# Patient Record
Sex: Male | Born: 1960 | Race: Black or African American | Hispanic: No | Marital: Married | Smoking: Former smoker
Health system: Southern US, Community
[De-identification: ages and names within clinical notes are randomized; demographics above are authoritative.]

## PROBLEM LIST (undated history)

## (undated) ENCOUNTER — Emergency Department (HOSPITAL_COMMUNITY): Payer: Medicare HMO

## (undated) DIAGNOSIS — M1990 Unspecified osteoarthritis, unspecified site: Secondary | ICD-10-CM

## (undated) DIAGNOSIS — I10 Essential (primary) hypertension: Secondary | ICD-10-CM

## (undated) DIAGNOSIS — F419 Anxiety disorder, unspecified: Secondary | ICD-10-CM

## (undated) DIAGNOSIS — F329 Major depressive disorder, single episode, unspecified: Secondary | ICD-10-CM

## (undated) DIAGNOSIS — F1910 Other psychoactive substance abuse, uncomplicated: Secondary | ICD-10-CM

## (undated) DIAGNOSIS — F32A Depression, unspecified: Secondary | ICD-10-CM

## (undated) DIAGNOSIS — M109 Gout, unspecified: Secondary | ICD-10-CM

## (undated) DIAGNOSIS — E785 Hyperlipidemia, unspecified: Secondary | ICD-10-CM

## (undated) DIAGNOSIS — D571 Sickle-cell disease without crisis: Secondary | ICD-10-CM

## (undated) DIAGNOSIS — I00 Rheumatic fever without heart involvement: Secondary | ICD-10-CM

## (undated) DIAGNOSIS — I639 Cerebral infarction, unspecified: Secondary | ICD-10-CM

## (undated) DIAGNOSIS — E039 Hypothyroidism, unspecified: Secondary | ICD-10-CM

## (undated) HISTORY — PX: TONSILLECTOMY: SUR1361

## (undated) HISTORY — DX: Rheumatic fever without heart involvement: I00

## (undated) HISTORY — DX: Other psychoactive substance abuse, uncomplicated: F19.10

## (undated) HISTORY — DX: Unspecified osteoarthritis, unspecified site: M19.90

## (undated) HISTORY — PX: COLONOSCOPY: SHX174

## (undated) HISTORY — DX: Sickle-cell disease without crisis: D57.1

## (undated) HISTORY — DX: Hyperlipidemia, unspecified: E78.5

## (undated) HISTORY — PX: OTHER SURGICAL HISTORY: SHX169

---

## 2004-10-20 ENCOUNTER — Emergency Department (HOSPITAL_COMMUNITY): Admission: EM | Admit: 2004-10-20 | Discharge: 2004-10-21 | Payer: Self-pay | Admitting: Emergency Medicine

## 2007-07-31 ENCOUNTER — Emergency Department (HOSPITAL_COMMUNITY): Admission: EM | Admit: 2007-07-31 | Discharge: 2007-07-31 | Payer: Self-pay | Admitting: Emergency Medicine

## 2008-09-28 ENCOUNTER — Emergency Department (HOSPITAL_COMMUNITY): Admission: EM | Admit: 2008-09-28 | Discharge: 2008-09-28 | Payer: Self-pay | Admitting: Emergency Medicine

## 2009-04-16 ENCOUNTER — Encounter (INDEPENDENT_AMBULATORY_CARE_PROVIDER_SITE_OTHER): Payer: Self-pay | Admitting: Emergency Medicine

## 2009-04-16 ENCOUNTER — Ambulatory Visit: Payer: Self-pay | Admitting: Vascular Surgery

## 2009-04-16 ENCOUNTER — Ambulatory Visit (HOSPITAL_COMMUNITY): Admission: RE | Admit: 2009-04-16 | Discharge: 2009-04-16 | Payer: Self-pay | Admitting: Emergency Medicine

## 2009-04-16 ENCOUNTER — Emergency Department (HOSPITAL_COMMUNITY): Admission: EM | Admit: 2009-04-16 | Discharge: 2009-04-16 | Payer: Self-pay | Admitting: Emergency Medicine

## 2011-03-24 ENCOUNTER — Emergency Department (HOSPITAL_COMMUNITY)
Admission: EM | Admit: 2011-03-24 | Discharge: 2011-03-25 | Disposition: A | Discharge status: Other Acute Inpatient Hospital | Payer: 59 | Source: Home / Self Care | Attending: Emergency Medicine | Admitting: Emergency Medicine

## 2011-03-24 DIAGNOSIS — R45851 Suicidal ideations: Secondary | ICD-10-CM

## 2011-03-24 DIAGNOSIS — F329 Major depressive disorder, single episode, unspecified: Secondary | ICD-10-CM

## 2011-03-24 DIAGNOSIS — E119 Type 2 diabetes mellitus without complications: Secondary | ICD-10-CM | POA: Diagnosis not present

## 2011-03-24 DIAGNOSIS — Z79899 Other long term (current) drug therapy: Secondary | ICD-10-CM | POA: Insufficient documentation

## 2011-03-24 DIAGNOSIS — F1020 Alcohol dependence, uncomplicated: Secondary | ICD-10-CM | POA: Diagnosis not present

## 2011-03-24 DIAGNOSIS — E876 Hypokalemia: Secondary | ICD-10-CM | POA: Diagnosis not present

## 2011-03-24 DIAGNOSIS — F3289 Other specified depressive episodes: Secondary | ICD-10-CM | POA: Insufficient documentation

## 2011-03-24 HISTORY — DX: Cerebral infarction, unspecified: I63.9

## 2011-03-24 HISTORY — DX: Essential (primary) hypertension: I10

## 2011-03-24 LAB — COMPREHENSIVE METABOLIC PANEL
ALT: 62 U/L — ABNORMAL HIGH (ref 0–53)
AST: 54 U/L — ABNORMAL HIGH (ref 0–37)
Albumin: 4.1 g/dL (ref 3.5–5.2)
Calcium: 9 mg/dL (ref 8.4–10.5)
Creatinine, Ser: 1.33 mg/dL (ref 0.50–1.35)
Sodium: 140 mEq/L (ref 135–145)
Total Protein: 7.3 g/dL (ref 6.0–8.3)

## 2011-03-24 LAB — RAPID URINE DRUG SCREEN, HOSP PERFORMED
Barbiturates: NOT DETECTED
Benzodiazepines: NOT DETECTED
Cocaine: POSITIVE — AB
Opiates: NOT DETECTED
Tetrahydrocannabinol: NOT DETECTED

## 2011-03-24 LAB — CBC
MCH: 28.5 pg (ref 26.0–34.0)
MCHC: 35.5 g/dL (ref 30.0–36.0)
MCV: 80.2 fL (ref 78.0–100.0)
Platelets: 309 10*3/uL (ref 150–400)
RBC: 5.09 MIL/uL (ref 4.22–5.81)
RDW: 14.5 % (ref 11.5–15.5)

## 2011-03-24 LAB — GLUCOSE, CAPILLARY: Glucose-Capillary: 82 mg/dL (ref 70–99)

## 2011-03-24 MED ORDER — ONDANSETRON HCL 4 MG PO TABS: 4.0000 mg | ORAL_TABLET | Freq: Three times a day (TID) | ORAL | Status: DC | PRN

## 2011-03-24 MED ORDER — METFORMIN HCL 500 MG PO TABS
500.0000 mg | ORAL_TABLET | Freq: Three times a day (TID) | ORAL | Status: DC
  Administered 2011-03-25: 500 mg via ORAL
  Filled 2011-03-24 (×3): qty 1

## 2011-03-24 MED ORDER — GLYBURIDE 5 MG PO TABS
5.0000 mg | ORAL_TABLET | Freq: Three times a day (TID) | ORAL | Status: DC
  Administered 2011-03-25: 5 mg via ORAL
  Filled 2011-03-24 (×3): qty 1

## 2011-03-24 MED ORDER — ASPIRIN 325 MG PO TABS
325.0000 mg | ORAL_TABLET | Freq: Every day | ORAL | Status: DC
  Administered 2011-03-24 – 2011-03-25 (×2): 325 mg via ORAL
  Filled 2011-03-24 (×2): qty 1

## 2011-03-24 MED ORDER — SIMVASTATIN 20 MG PO TABS
20.0000 mg | ORAL_TABLET | Freq: Every day | ORAL | Status: DC
  Administered 2011-03-25: 20 mg via ORAL
  Filled 2011-03-24: qty 1

## 2011-03-24 MED ORDER — IBUPROFEN 600 MG PO TABS: 600.0000 mg | ORAL_TABLET | Freq: Three times a day (TID) | ORAL | Status: DC | PRN

## 2011-03-24 MED ORDER — LORAZEPAM 1 MG PO TABS
1.0000 mg | ORAL_TABLET | Freq: Three times a day (TID) | ORAL | Status: DC | PRN
  Administered 2011-03-25: 1 mg via ORAL
  Filled 2011-03-24: qty 1

## 2011-03-24 MED ORDER — ACETAMINOPHEN 325 MG PO TABS: 650.0000 mg | ORAL_TABLET | ORAL | Status: DC | PRN

## 2011-03-24 MED ORDER — COLCHICINE 0.6 MG PO TABS
0.6000 mg | ORAL_TABLET | Freq: Every day | ORAL | Status: DC
  Administered 2011-03-25: 0.6 mg via ORAL
  Filled 2011-03-24: qty 1

## 2011-03-24 MED ORDER — DILTIAZEM HCL ER COATED BEADS 360 MG PO CP24
360.0000 mg | ORAL_CAPSULE | Freq: Every day | ORAL | Status: DC
  Administered 2011-03-25: 360 mg via ORAL
  Filled 2011-03-24: qty 1

## 2011-03-24 MED ORDER — LEVOTHYROXINE SODIUM 200 MCG PO TABS
200.0000 ug | ORAL_TABLET | Freq: Every day | ORAL | Status: DC
  Administered 2011-03-25: 200 ug via ORAL
  Filled 2011-03-24: qty 1

## 2011-03-24 MED ORDER — ZOLPIDEM TARTRATE 5 MG PO TABS
5.0000 mg | ORAL_TABLET | Freq: Every evening | ORAL | Status: DC | PRN
  Administered 2011-03-24: 5 mg via ORAL
  Filled 2011-03-24: qty 1

## 2011-03-24 MED ORDER — INDOMETHACIN 50 MG PO CAPS
50.0000 mg | ORAL_CAPSULE | Freq: Three times a day (TID) | ORAL | Status: DC | PRN
  Filled 2011-03-24: qty 1

## 2011-03-24 MED ORDER — POTASSIUM CHLORIDE CRYS ER 20 MEQ PO TBCR
40.0000 meq | EXTENDED_RELEASE_TABLET | Freq: Once | ORAL | Status: AC
  Administered 2011-03-24: 40 meq via ORAL
  Filled 2011-03-24: qty 2

## 2011-03-24 MED ORDER — FUROSEMIDE 20 MG PO TABS
20.0000 mg | ORAL_TABLET | Freq: Every day | ORAL | Status: DC
  Administered 2011-03-25: 20 mg via ORAL
  Filled 2011-03-24: qty 1

## 2011-03-24 MED ORDER — LISINOPRIL 10 MG PO TABS
10.0000 mg | ORAL_TABLET | Freq: Every day | ORAL | Status: DC
  Administered 2011-03-25: 10 mg via ORAL
  Filled 2011-03-24: qty 1

## 2011-03-24 MED ORDER — ALUM & MAG HYDROXIDE-SIMETH 200-200-20 MG/5ML PO SUSP
30.0000 mL | ORAL | Status: DC | PRN
Start: 2011-03-24 — End: 2011-03-25

## 2011-03-24 MED ORDER — GLYBURIDE-METFORMIN 5-500 MG PO TABS: 1.0000 | ORAL_TABLET | Freq: Three times a day (TID) | ORAL | Status: DC

## 2011-03-24 MED ORDER — METOLAZONE 5 MG PO TABS
5.0000 mg | ORAL_TABLET | Freq: Every day | ORAL | Status: DC
  Administered 2011-03-25: 5 mg via ORAL
  Filled 2011-03-24: qty 1

## 2011-03-24 MED ORDER — FEBUXOSTAT 40 MG PO TABS
40.0000 mg | ORAL_TABLET | Freq: Every day | ORAL | Status: DC
  Administered 2011-03-25: 40 mg via ORAL
  Filled 2011-03-24: qty 1

## 2011-03-24 NOTE — ED Notes (Signed)
Pt sitting on the side of the bed reports he is having "a hot flash" and that the room is hot.  Pt sweating, nad, appears anxious.  Cold water given, temp in room decreased, reasurred, sitting under vent.

## 2011-03-24 NOTE — ED Notes (Signed)
Pt reports increasing depression x2 weeks, w/ no hx of depression.  Pt reports previous attempt by cutting his inner forearm several yrs ago.  Pt denies hi at this time

## 2011-03-24 NOTE — ED Notes (Signed)
Dr zammit informed of potassium results

## 2011-03-24 NOTE — ED Provider Notes (Signed)
History     CSN: [id number] Arrival date & time: 03/24/2011  1:41 PM   First MD Initiated Contact with Patient 03/24/11 1356      Chief Complaint  Patient presents with  . V70.1    pt in with depression very tearful on arrival states unable to sleep states etoh and drug use due to family situation states ongoing x2 weeks pt states s/i with plan to cut wrist    (Consider location/radiation/quality/duration/timing/severity/associated sxs/prior treatment) HPI Comments: Patient presents with increasing depression and development of suicidal ideations over the past 2 weeks. He states he has a decreased appetite, has been unable to sleep and has a feeling of worthlessness which has been present over the past 2 weeks. He states he's had multiple plans on how to harm himself however is not carried out any of these plans. He denies homicidal ideation or psychosis. He denies chest pain, shortness of breath, abdominal pain, nausea, vomiting, urinary symptoms. He has no prior history of depression he states he does not take any medications for this. To deal with his increased depression he he has been drinking alcohol and on one occasion smoked crack cocaine.  The history is provided by the patient. No language interpreter was used.    History reviewed. No pertinent past medical history.  History reviewed. No pertinent past surgical history.  History reviewed. No pertinent family history.  History  Substance Use Topics  . Smoking status: Not on file  . Smokeless tobacco: Not on file  . Alcohol Use: Not on file      Review of Systems  Constitutional: Positive for activity change, appetite change and fatigue. Negative for fever.  HENT: Negative for congestion, sore throat, rhinorrhea, neck pain and neck stiffness.   Respiratory: Negative for cough and shortness of breath.   Cardiovascular: Negative for chest pain and palpitations.  Gastrointestinal: Negative for nausea, vomiting and  abdominal pain.  Genitourinary: Negative for dysuria, urgency, frequency and flank pain.  Musculoskeletal: Negative for myalgias, back pain and arthralgias.  Neurological: Negative for dizziness, weakness, light-headedness, numbness and headaches.  Psychiatric/Behavioral: Positive for suicidal ideas, sleep disturbance and dysphoric mood. Negative for hallucinations, behavioral problems, confusion and self-injury.  All other systems reviewed and are negative.    Allergies  Shellfish allergy  Home Medications   Current Outpatient Rx  Name Route Sig Dispense Refill  . ASPIRIN 325 MG PO TABS Oral Take 325 mg by mouth daily.      . COLCHICINE 0.6 MG PO TABS Oral Take 0.6 mg by mouth daily. Gout Flare    . DILTIAZEM HCL COATED BEADS 360 MG PO CP24 Oral Take 360 mg by mouth daily.      . FEBUXOSTAT 40 MG PO TABS Oral Take 40 mg by mouth daily.      . FUROSEMIDE 20 MG PO TABS Oral Take 20 mg by mouth daily.      . GLYBURIDE-METFORMIN 5-500 MG PO TABS Oral Take 1 tablet by mouth 3 (three) times daily.      . INDOMETHACIN 50 MG PO CAPS Oral Take 50 mg by mouth 3 (three) times daily as needed.      Marland Kitchen LEVOTHYROXINE SODIUM 200 MCG PO TABS Oral Take 200 mcg by mouth daily.      Marland Kitchen LISINOPRIL 10 MG PO TABS Oral Take 10 mg by mouth daily.      Marland Kitchen METOLAZONE 5 MG PO TABS Oral Take 5 mg by mouth daily.      Marland Kitchen  OVER THE COUNTER MEDICATION Oral Take 3 tablets by mouth as needed. Sleep Aid    . POTASSIUM CHLORIDE 10 MEQ PO TBCR Oral Take 10 mEq by mouth daily.      Marland Kitchen SIMVASTATIN 20 MG PO TABS Oral Take 20 mg by mouth daily.        BP 149/95  Pulse 74  Temp(Src) 98.4 F (36.9 C) (Oral)  Resp 18  SpO2 97%  Physical Exam  Nursing note and vitals reviewed. Constitutional: He is oriented to person, place, and time. He appears well-developed and well-nourished.       tearful  HENT:  Head: Normocephalic and atraumatic.  Mouth/Throat: Oropharynx is clear and moist.  Eyes: Conjunctivae and EOM are  normal. Pupils are equal, round, and reactive to light.  Neck: Normal range of motion. Neck supple.  Cardiovascular: Normal rate, regular rhythm, normal heart sounds and intact distal pulses.  Exam reveals no gallop and no friction rub.   No murmur heard. Pulmonary/Chest: Effort normal and breath sounds normal. No respiratory distress.  Abdominal: Soft. Bowel sounds are normal. There is no tenderness.  Musculoskeletal: Normal range of motion. He exhibits no tenderness.  Neurological: He is alert and oriented to person, place, and time.  Skin: Skin is warm and dry.  Psychiatric: He is slowed and withdrawn. He exhibits a depressed mood. He expresses suicidal ideation. He expresses no homicidal ideation. He expresses suicidal plans. He expresses no homicidal plans.    ED Course  Procedures (including critical care time)   Labs Reviewed  CBC  COMPREHENSIVE METABOLIC PANEL  ETHANOL  URINE RAPID DRUG SCREEN (HOSP PERFORMED)   No results found.   1. Depression   2. Suicidal ideations       MDM  New onset depression with suicidal ideation. Medical screening labs were obtained I discussed the case with the ACT team after attempting to admit the patient directly to the behavioral health unit. Psych holding orders were performed. His home medications to be administered per pharmacy med reconciliation.  I feel that this patient will require admission for further evaluation and treatment of his depression        Dayton Bailiff, MD 03/24/11 1553
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
## 2011-03-25 ENCOUNTER — Telehealth (HOSPITAL_COMMUNITY): Payer: Self-pay | Admitting: Licensed Clinical Social Worker

## 2011-03-25 ENCOUNTER — Encounter (HOSPITAL_COMMUNITY): Payer: Self-pay

## 2011-03-25 ENCOUNTER — Inpatient Hospital Stay (HOSPITAL_COMMUNITY)
Admission: AD | Admit: 2011-03-25 | Discharge: 2011-03-28 | DRG: 641 | Disposition: A | Discharge status: Other Acute Inpatient Hospital | Payer: 59 | Source: Other Acute Inpatient Hospital | Attending: Internal Medicine | Admitting: Internal Medicine

## 2011-03-25 DIAGNOSIS — F329 Major depressive disorder, single episode, unspecified: Secondary | ICD-10-CM | POA: Diagnosis present

## 2011-03-25 DIAGNOSIS — F3289 Other specified depressive episodes: Secondary | ICD-10-CM | POA: Diagnosis present

## 2011-03-25 DIAGNOSIS — E039 Hypothyroidism, unspecified: Secondary | ICD-10-CM | POA: Diagnosis present

## 2011-03-25 DIAGNOSIS — F1020 Alcohol dependence, uncomplicated: Secondary | ICD-10-CM | POA: Diagnosis present

## 2011-03-25 DIAGNOSIS — E119 Type 2 diabetes mellitus without complications: Secondary | ICD-10-CM | POA: Diagnosis present

## 2011-03-25 DIAGNOSIS — Z8673 Personal history of transient ischemic attack (TIA), and cerebral infarction without residual deficits: Secondary | ICD-10-CM

## 2011-03-25 DIAGNOSIS — E876 Hypokalemia: Principal | ICD-10-CM | POA: Diagnosis present

## 2011-03-25 DIAGNOSIS — F32A Depression, unspecified: Secondary | ICD-10-CM | POA: Diagnosis present

## 2011-03-25 HISTORY — DX: Hypothyroidism, unspecified: E03.9

## 2011-03-25 HISTORY — DX: Anxiety disorder, unspecified: F41.9

## 2011-03-25 HISTORY — DX: Depression, unspecified: F32.A

## 2011-03-25 HISTORY — DX: Major depressive disorder, single episode, unspecified: F32.9

## 2011-03-25 LAB — GLUCOSE, CAPILLARY
Glucose-Capillary: 182 mg/dL — ABNORMAL HIGH (ref 70–99)
Glucose-Capillary: 111 mg/dL — ABNORMAL HIGH (ref 70–99)

## 2011-03-25 MED ORDER — QUETIAPINE FUMARATE 100 MG PO TABS
100.0000 mg | ORAL_TABLET | Freq: Once | ORAL | Status: AC
  Administered 2011-03-25: 100 mg via ORAL
  Filled 2011-03-25: qty 1

## 2011-03-25 MED ORDER — ALUM & MAG HYDROXIDE-SIMETH 200-200-20 MG/5ML PO SUSP: 30.0000 mL | ORAL | Status: DC | PRN

## 2011-03-25 MED ORDER — GLYBURIDE 5 MG PO TABS: 5.0000 mg | ORAL_TABLET | Freq: Every day | ORAL | Status: DC

## 2011-03-25 MED ORDER — METFORMIN HCL 500 MG PO TABS: 500.0000 mg | ORAL_TABLET | Freq: Every day | ORAL | Status: DC

## 2011-03-25 MED ORDER — MAGNESIUM HYDROXIDE 400 MG/5ML PO SUSP: 30.0000 mL | Freq: Every day | ORAL | Status: DC | PRN

## 2011-03-25 MED ORDER — ACETAMINOPHEN 325 MG PO TABS
650.0000 mg | ORAL_TABLET | Freq: Four times a day (QID) | ORAL | Status: DC | PRN
Start: 2011-03-25 — End: 2011-03-28

## 2011-03-25 NOTE — ED Notes (Signed)
Patient requested a shower at this time. Provided toiletries, clean scrubs and changed linen.

## 2011-03-25 NOTE — BH Assessment (Signed)
Disposition: Pt accepted to [facility] --Lynann Bologna, NP to Dr. Emmit Pomfret. Writer contacted EDP-Dr. Freida Busman and made him aware that pt has been accepted to [facility]. He agrees with disposition and pt will be transferred to [facility] via [employer] due to IVC.   *Writer completed pt's support paperwork.

## 2011-03-25 NOTE — ED Notes (Signed)
Called report to [place], pt. Going to 501-2 bed by Dr. Allena Katz.  BH asked [place] to send pt. After 1430.

## 2011-03-25 NOTE — ED Notes (Signed)
Pt. D/c'ed to [facility], pt. Signed all d/c papers, pt. Transported to [facility] [facility] with [employer] and [facility] NT.   Pt. Ambulated out of psych ed and 1 bag of pt. Belongings sent with pt. To BH.

## 2011-03-25 NOTE — ED Notes (Signed)
Offered Patient shower. Patient declined at this time.

## 2011-03-25 NOTE — BH Assessment (Incomplete)
Assessment Note   Phillip Turner is an 50 y.o. male.    Past Medical History:  Past Medical History  Diagnosis Date  . Diabetes mellitus   . Hypertension   . Stroke     No past surgical history on file.  Family History: No family history on file.  Social History:  reports that he has never smoked. He does not have any smokeless tobacco history on file. He reports that he drinks about 2.4 ounces of alcohol per week. He reports that he uses illicit drugs ("Crack" cocaine).  Allergies:  Allergies  Allergen Reactions  . Shellfish Allergy Anaphylaxis and Nausea And Vomiting    Home Medications:  Medications Prior to Admission  Medication Dose Route Frequency Provider Last Rate Last Dose  . acetaminophen (TYLENOL) tablet 650 mg  650 mg Oral Q4H PRN Dayton Bailiff, MD      . alum & mag hydroxide-simeth (MAALOX/MYLANTA) 200-200-20 MG/5ML suspension 30 mL  30 mL Oral PRN Dayton Bailiff, MD      . aspirin tablet 325 mg  325 mg Oral Daily Benny Lennert, MD   325 mg at 03/25/11 0925  . colchicine tablet 0.6 mg  0.6 mg Oral Daily Benny Lennert, MD   0.6 mg at 03/25/11 0925  . diltiazem (CARDIZEM CD) 24 hr capsule 360 mg  360 mg Oral Daily Benny Lennert, MD   360 mg at 03/25/11 0926  . febuxostat (ULORIC) tablet 40 mg  40 mg Oral Daily Benny Lennert, MD   40 mg at 03/25/11 0926  . furosemide (LASIX) tablet 20 mg  20 mg Oral Daily Benny Lennert, MD   20 mg at 03/25/11 0926  . glyBURIDE (DIABETA) tablet 5 mg  5 mg Oral Q breakfast Toy Baker, MD      . ibuprofen (ADVIL,MOTRIN) tablet 600 mg  600 mg Oral Q8H PRN Dayton Bailiff, MD      . indomethacin (INDOCIN) capsule 50 mg  50 mg Oral TID PRN Benny Lennert, MD      . levothyroxine (SYNTHROID, LEVOTHROID) tablet 200 mcg  200 mcg Oral QAC breakfast Benny Lennert, MD   200 mcg at 03/25/11 0800  . lisinopril (PRINIVIL,ZESTRIL) tablet 10 mg  10 mg Oral Daily Benny Lennert, MD   10 mg at 03/25/11 0926  . LORazepam (ATIVAN) tablet 1 mg   1 mg Oral Q8H PRN Dayton Bailiff, MD   1 mg at 03/25/11 0156  . metFORMIN (GLUCOPHAGE) tablet 500 mg  500 mg Oral Q breakfast Toy Baker, MD      . metolazone (ZAROXOLYN) tablet 5 mg  5 mg Oral Daily Benny Lennert, MD   5 mg at 03/25/11 6213  . ondansetron (ZOFRAN) tablet 4 mg  4 mg Oral Q8H PRN Dayton Bailiff, MD      . potassium chloride SA (K-DUR,KLOR-CON) CR tablet 40 mEq  40 mEq Oral Once Benny Lennert, MD   40 mEq at 03/24/11 1718  . potassium chloride SA (K-DUR,KLOR-CON) CR tablet 40 mEq  40 mEq Oral Once Benny Lennert, MD   40 mEq at 03/24/11 2218  . simvastatin (ZOCOR) tablet 20 mg  20 mg Oral Daily Benny Lennert, MD   20 mg at 03/25/11 0865  . zolpidem (AMBIEN) tablet 5 mg  5 mg Oral QHS PRN Dayton Bailiff, MD   5 mg at 03/24/11 2218  . DISCONTD: glyBURIDE (DIABETA) tablet 5 mg  5 mg Oral TID  WC Benny Lennert, MD   5 mg at 03/25/11 1005  . DISCONTD: glyBURIDE-metformin (GLUCOVANCE) 5-500 MG per tablet 1 tablet  1 tablet Oral TID Benny Lennert, MD      . DISCONTD: metFORMIN (GLUCOPHAGE) tablet 500 mg  500 mg Oral TID WC Benny Lennert, MD   500 mg at 03/25/11 1007   Medications Prior to Admission  Medication Sig Dispense Refill  . aspirin 325 MG tablet Take 325 mg by mouth daily.        . colchicine 0.6 MG tablet Take 0.6 mg by mouth daily. Gout Flare      . diltiazem (CARDIZEM CD) 360 MG 24 hr capsule Take 360 mg by mouth daily.        . febuxostat (ULORIC) 40 MG tablet Take 40 mg by mouth daily.        . furosemide (LASIX) 20 MG tablet Take 20 mg by mouth daily.        Marland Kitchen glyBURIDE-metformin (GLUCOVANCE) 5-500 MG per tablet Take 1 tablet by mouth 3 (three) times daily.        . indomethacin (INDOCIN) 50 MG capsule Take 50 mg by mouth 3 (three) times daily as needed.       Marland Kitchen levothyroxine (SYNTHROID, LEVOTHROID) 200 MCG tablet Take 200 mcg by mouth daily.        Marland Kitchen lisinopril (PRINIVIL,ZESTRIL) 10 MG tablet Take 10 mg by mouth daily.        . metolazone (ZAROXOLYN) 5 MG  tablet Take 5 mg by mouth daily.        Marland Kitchen OVER THE COUNTER MEDICATION Take 3 tablets by mouth as needed. Sleep Aid      . potassium chloride (KLOR-CON) 10 MEQ CR tablet Take 10 mEq by mouth daily.        . simvastatin (ZOCOR) 20 MG tablet Take 20 mg by mouth daily.          OB/GYN Status:  No LMP for male patient.                                             Disposition: Pt accepted to [facility] --Lynann Bologna, NP to Dr. Emmit Pomfret. Writer contacted EDP-Dr. Freida Busman and made him aware that pt has been accepted to [facility]. He agrees with disposition and pt will be transferred to [facility] via [employer] due to IVC.   *Writer completed pt's support paperwork.     On Site Evaluation by:   Reviewed with Physician:     Melynda Ripple [facility] 03/25/2011 2:34 PM

## 2011-03-25 NOTE — ED Notes (Signed)
EDP D/C'ed this medication at 1200.

## 2011-03-25 NOTE — Progress Notes (Signed)
Voluntary admission for a 50 year old male with flat affect and depressed mood who reports that he has been depressed for approximately 2 weeks.  Pt. Reports that he hears voices and has bad dreams at night and can not  Sleep which causes him to drink.  Pt. Reports that he was drinking 4 beers every night and when that did not help him to go to sleep he began doing cocaine on Thursday.  Pt. Reports that he has had a stroke but doesn't remember when he had the stroke.  . Reports he becomes confused at times when he is asked questions because some things he just can't remember. Pt. Is a  Diabetic.    Denies SI/HI and denies A/V hallucinations at present and contracts for safety.  Pt. Oriented offered food and oriented to unit

## 2011-03-25 NOTE — ED Provider Notes (Signed)
Patient seen back pain and is awaiting likely place  Behavior health hospital  Toy Baker, MD 03/25/11 1026

## 2011-03-26 ENCOUNTER — Encounter (HOSPITAL_COMMUNITY): Payer: Self-pay | Admitting: Emergency Medicine

## 2011-03-26 DIAGNOSIS — F331 Major depressive disorder, recurrent, moderate: Secondary | ICD-10-CM

## 2011-03-26 DIAGNOSIS — F1999 Other psychoactive substance use, unspecified with unspecified psychoactive substance-induced disorder: Secondary | ICD-10-CM | POA: Insufficient documentation

## 2011-03-26 DIAGNOSIS — F1410 Cocaine abuse, uncomplicated: Secondary | ICD-10-CM | POA: Insufficient documentation

## 2011-03-26 DIAGNOSIS — F1020 Alcohol dependence, uncomplicated: Secondary | ICD-10-CM | POA: Insufficient documentation

## 2011-03-26 LAB — GLUCOSE, CAPILLARY
Glucose-Capillary: 168 mg/dL — ABNORMAL HIGH (ref 70–99)
Glucose-Capillary: 118 mg/dL — ABNORMAL HIGH (ref 70–99)

## 2011-03-26 LAB — COMPREHENSIVE METABOLIC PANEL
ALT: 62 U/L — ABNORMAL HIGH (ref 0–53)
AST: 52 U/L — ABNORMAL HIGH (ref 0–37)
Albumin: 3.8 g/dL (ref 3.5–5.2)
Alkaline Phosphatase: 58 U/L (ref 39–117)
CO2: 27 mEq/L (ref 19–32)
Chloride: 100 mEq/L (ref 96–112)
GFR calc non Af Amer: 42 mL/min — ABNORMAL LOW (ref >90)
Potassium: 2.6 mEq/L — CL (ref 3.5–5.1)
Total Bilirubin: 0.6 mg/dL (ref 0.3–1.2)

## 2011-03-26 LAB — MAGNESIUM: Magnesium: 2 mg/dL (ref 1.5–2.5)

## 2011-03-26 LAB — POTASSIUM: Potassium: 2.8 mEq/L — ABNORMAL LOW (ref 3.5–5.1)

## 2011-03-26 MED ORDER — QUETIAPINE FUMARATE 50 MG PO TABS
50.0000 mg | ORAL_TABLET | Freq: Every day | ORAL | Status: DC
  Administered 2011-03-27: 50 mg via ORAL
  Filled 2011-03-26 (×2): qty 1

## 2011-03-26 MED ORDER — FEBUXOSTAT 40 MG PO TABS
40.0000 mg | ORAL_TABLET | Freq: Every day | ORAL | Status: DC
  Administered 2011-03-26 – 2011-03-28 (×3): 40 mg via ORAL
  Filled 2011-03-26 (×5): qty 1

## 2011-03-26 MED ORDER — DILTIAZEM HCL ER COATED BEADS 180 MG PO CP24
360.0000 mg | ORAL_CAPSULE | Freq: Every day | ORAL | Status: DC
  Administered 2011-03-26 – 2011-03-28 (×3): 360 mg via ORAL
  Filled 2011-03-26 (×2): qty 1
  Filled 2011-03-26: qty 2
  Filled 2011-03-26 (×2): qty 1

## 2011-03-26 MED ORDER — LEVOTHYROXINE SODIUM 200 MCG PO TABS
200.0000 ug | ORAL_TABLET | Freq: Every day | ORAL | Status: DC
  Administered 2011-03-26 – 2011-03-28 (×3): 200 ug via ORAL
  Filled 2011-03-26 (×4): qty 1

## 2011-03-26 MED ORDER — INDOMETHACIN 50 MG PO CAPS
50.0000 mg | ORAL_CAPSULE | Freq: Three times a day (TID) | ORAL | Status: DC | PRN
  Filled 2011-03-26: qty 1

## 2011-03-26 MED ORDER — FUROSEMIDE 20 MG PO TABS
20.0000 mg | ORAL_TABLET | Freq: Every day | ORAL | Status: DC
  Administered 2011-03-26: 20 mg via ORAL
  Filled 2011-03-26 (×3): qty 1

## 2011-03-26 MED ORDER — GLYBURIDE-METFORMIN 5-500 MG PO TABS: 1.0000 | ORAL_TABLET | Freq: Three times a day (TID) | ORAL | Status: DC

## 2011-03-26 MED ORDER — METFORMIN HCL 500 MG PO TABS
500.0000 mg | ORAL_TABLET | Freq: Three times a day (TID) | ORAL | Status: DC
  Administered 2011-03-26: 500 mg via ORAL
  Filled 2011-03-26 (×3): qty 1

## 2011-03-26 MED ORDER — POTASSIUM CHLORIDE 10 MEQ/100ML IV SOLN
10.0000 meq | INTRAVENOUS | Status: AC
  Administered 2011-03-27 (×2): 10 meq via INTRAVENOUS
  Filled 2011-03-26: qty 200

## 2011-03-26 MED ORDER — POTASSIUM CHLORIDE CRYS ER 20 MEQ PO TBCR
20.0000 meq | EXTENDED_RELEASE_TABLET | Freq: Two times a day (BID) | ORAL | Status: DC
  Administered 2011-03-26 – 2011-03-28 (×3): 20 meq via ORAL
  Filled 2011-03-26: qty 3
  Filled 2011-03-26 (×2): qty 1

## 2011-03-26 MED ORDER — SIMVASTATIN 20 MG PO TABS
20.0000 mg | ORAL_TABLET | Freq: Every day | ORAL | Status: DC
  Administered 2011-03-26 – 2011-03-28 (×3): 20 mg via ORAL
  Filled 2011-03-26 (×2): qty 1

## 2011-03-26 MED ORDER — ASPIRIN 325 MG PO TABS
325.0000 mg | ORAL_TABLET | Freq: Every day | ORAL | Status: DC
  Administered 2011-03-26 – 2011-03-28 (×2): 325 mg via ORAL
  Filled 2011-03-26 (×2): qty 1

## 2011-03-26 MED ORDER — GLYBURIDE 5 MG PO TABS
5.0000 mg | ORAL_TABLET | Freq: Three times a day (TID) | ORAL | Status: DC
  Administered 2011-03-26: 5 mg via ORAL
  Filled 2011-03-26 (×3): qty 1

## 2011-03-26 MED ORDER — COLCHICINE 0.6 MG PO TABS
0.6000 mg | ORAL_TABLET | Freq: Every day | ORAL | Status: DC
  Administered 2011-03-26 – 2011-03-28 (×3): 0.6 mg via ORAL
  Filled 2011-03-26 (×3): qty 1

## 2011-03-26 MED ORDER — LISINOPRIL 10 MG PO TABS
10.0000 mg | ORAL_TABLET | Freq: Every day | ORAL | Status: DC
  Administered 2011-03-26 – 2011-03-27 (×2): 10 mg via ORAL
  Filled 2011-03-26 (×2): qty 1

## 2011-03-26 MED ORDER — SERTRALINE HCL 50 MG PO TABS
50.0000 mg | ORAL_TABLET | Freq: Every day | ORAL | Status: DC
  Administered 2011-03-26 – 2011-03-28 (×3): 50 mg via ORAL
  Filled 2011-03-26 (×2): qty 1

## 2011-03-26 MED ORDER — METOLAZONE 5 MG PO TABS
5.0000 mg | ORAL_TABLET | Freq: Every day | ORAL | Status: DC
  Administered 2011-03-26 – 2011-03-27 (×2): 5 mg via ORAL
  Filled 2011-03-26 (×3): qty 1

## 2011-03-26 MED ORDER — POTASSIUM CHLORIDE 20 MEQ/15ML (10%) PO LIQD
40.0000 meq | Freq: Once | ORAL | Status: AC
  Administered 2011-03-27: 40 meq via ORAL
  Filled 2011-03-26: qty 30

## 2011-03-26 NOTE — ED Notes (Signed)
Chief Complaint  Patient presents with  . Labs Only    Pt was sent to from behavioral health with potassium of 2.6. Pt with no complaints. Sitter at bedside

## 2011-03-26 NOTE — Progress Notes (Signed)
Patient ID: Phillip Turner, male   DOB: 24-Mar-1961, 50 y.o.   MRN: [id number] Pt is very concerned about his insomnia.  Says this has been a major problem for him.  Says he hears voices and sees shadows, but not this am.  Says he thinks he needs to be more positive.  He is raating his depression and his hopelessness a 3.  He also reports some tremors
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
## 2011-03-26 NOTE — Tx Team (Signed)
Interdisciplinary Treatment Plan Update (Adult)  Date:  03/26/2011  Time Reviewed:  10:03 AM   Progress in Treatment: Attending groups: Yes pt attended d/c planning group  Participating in groups:  Yes, actively shared Taking medication as prescribed: Yes Tolerating medication:  Yes Family/Significant othe contact made:  Counselor assessing for appropriate contact Patient understands diagnosis:  Yes Discussing patient identified problems/goals with staff:  Yes Medical problems stabilized or resolved:  Yes Denies suicidal/homicidal ideation: Yes Issues/concerns per patient self-inventory:  None identified Other: N/A  New problem(s) identified: None Identified  Reason for Continuation of Hospitalization: Anxiety Depression Medication stabilization  Interventions implemented related to continuation of hospitalization: mood stabilization, medication monitoring and adjustment, group therapy and psycho education, safety checks q 15 mins  Additional comments: N/A  Estimated length of stay: 3-5 days  Discharge Plan: Pt will follow up at [facility] for med management and therapy   New goal(s): N/A  Review of initial/current patient goals per problem list:   1.  Goal(s): Depression  Met:  No  Target date: by discharge  As evidenced by: Reduce depression from a 10 to a 3. Pt ranks at a 7 today.  2.  Goal (s): Suicidal Ideation  Met:  No  Target date: by discharge  As evidenced by: Eliminate suicidal ideation.   3.  Goal(s):  Met:  No  Target date: by discharge  As evidenced by:  4.  Goal(s):  Met:  No  Target date: by discharge  As evidenced by:   Attendees: Patient:  Phillip Turner 03/26/2011 11:11 AM   Family:     Physician:  Franchot Gallo, MD  03/26/2011  10:03 AM   Nursing:   Neill Loft, RN 03/26/2011 10:04 AM   Case Manager:  Reyes Ivan, LCSWA 03/26/2011  10:03 AM   Counselor:  Angus Palms, LCSW 03/26/2011  10:03 AM   Other:  Juline Patch, LCSW 03/26/2011  10:03 AM   Other:  Landry Corporal, NP 03/26/2011 10:04 AM   Other:     Other:      Scribe for Treatment Team:   Carmina Miller, 03/26/2011 , 10:03 AM

## 2011-03-26 NOTE — Progress Notes (Signed)
Patient ID: Phillip Turner, male   DOB: 1961-04-07, 50 y.o.   MRN: [id number] Pt. Slightly more animated, noted smiling several times, but remains overtly depressed.  Eyes clearer, but pt still slow to move and reports needing more sleep.  Denies SI/HI , but reports feeling sad, and is close to tears when asked how he is feeling.  Cooperative with pt. Activities.  Cont. On q 15 min. Observations and is safe at this time.

## 2011-03-26 NOTE — ED Notes (Signed)
ZOX:[id number]<AV> Expected date:03/26/11<BR> Expected time: 9:44 PM<BR> Means of arrival:Ambulance<BR> Comments:<BR> From [facility], low potassium
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
## 2011-03-26 NOTE — Progress Notes (Signed)
Patient ID: Phillip Turner, male   DOB: 04/09/1961, 50 y.o.   MRN: [id number] Psychiatric Admission Assessment Adult  Patient Identification:  Phillip Turner Date of Evaluation:  March 26, 2011  History of Present Illness:: depressed, trouble with sleep. Recent use of beer and cocaine Past Psychiatric History: Diagnosis:  Hospitalizations:  Outpatient Care:      Suicidal Attempts:  Violent Behaviors:   Past Medical History:     Past Medical History  Diagnosis Date  . Diabetes mellitus   . Hypertension   . Stroke   . Hypothyroidism   . Anxiety   . Depression        Past Surgical History  Procedure Date  . Tonsillectomy     Allergies:  Allergies  Allergen Reactions  . Shellfish Allergy Anaphylaxis and Nausea And Vomiting    Current Medications:  Prior to Admission medications   Medication Sig Start Date End Date Taking? Authorizing Provider  aspirin 325 MG tablet Take 325 mg by mouth daily.     Yes Historical Provider, MD  colchicine 0.6 MG tablet Take 0.6 mg by mouth daily. Gout Flare   Yes Historical Provider, MD  diltiazem (CARDIZEM CD) 360 MG 24 hr capsule Take 360 mg by mouth daily.     Yes Historical Provider, MD  glyBURIDE-metformin (GLUCOVANCE) 5-500 MG per tablet Take 1 tablet by mouth 3 (three) times daily.     Yes Historical Provider, MD  levothyroxine (SYNTHROID, LEVOTHROID) 200 MCG tablet Take 200 mcg by mouth daily.     Yes Historical Provider, MD  lisinopril (PRINIVIL,ZESTRIL) 10 MG tablet Take 10 mg by mouth daily.     Yes Historical Provider, MD  potassium chloride (KLOR-CON) 10 MEQ CR tablet Take 10 mEq by mouth daily.     Yes Historical Provider, MD  simvastatin (ZOCOR) 20 MG tablet Take 20 mg by mouth daily.     Yes Historical Provider, MD    Social History:    reports that he has quit smoking. He does not have any smokeless tobacco history on file. He reports that he drinks about 16.8 ounces of alcohol per week. He reports that he uses illicit  drugs ("Crack" cocaine and Cocaine).   Family History:    No family history on file.  Mental Status Examination/Evaluation: Objective:  Appearance: Fairly Groomed  Patent attorney::  Poor  Speech:  Slow  Volume:  Normal  Mood:    Affect:  Congruent  Thought Process:  Logical  Orientation:  Full    Suicidal Thoughts:  No  Homicidal Thoughts:  No  Judgement:  Intact  Insight:  Fair  Psychomotor Activity:  Normal            Assessment:   AXIS I   AXIS II deferred  AXIS III Diabetes, htn, hypokalemia, hyperlipidemia  AXIS IV Medical issues, recent substance use  AXIS V 45     Treatment Plan Summary: Resume home meds. Replace potassium, check blood sugars collateral information

## 2011-03-26 NOTE — Progress Notes (Signed)
[facility] Group Notes:  (Counselor/Nursing/MHT/Case Management/Adjunct)  03/26/2011 12:36 PM  Type of Therapy:  Group Counseling  Participation Level:  Active  Participation Quality:  Appropriate  Affect:  Appropriate  Cognitive:  Appropriate  Insight:  Good  Engagement in Group:  Good  Engagement in Therapy:  Good  Modes of Intervention:  Clarification, Problem-solving and Support  Summary of Progress/Problems: Phillip Turner had the opportunity to seek clarity and process his issues with understanding diagnosis and how this impacts his family and lifestyle changes.  He was open to sharing his experiences and receptive to other's ideas and insight.  Phillip Turner recognized that his presenting issue related to diagnosis is accepting that it has impacted his life not his family's.  This in turn has resulted in him reacting negatively towards his family when he really just wants to be understood and have support.  Phillip Turner relates to struggling with his ego playing a major part in accepting the losses and reshaping the structure of his sense of self as well as the dynamics of his family.  He appears hopeful and is taking advantage of the opportunity to process during the sessions.   Quincy Sheehan 03/26/2011, 12:36 PM  Cosigned by: Angus Palms, LCSW

## 2011-03-26 NOTE — Progress Notes (Signed)
Pt attended discharge planning group and actively participated.  Pt presents with flat affect and depressed mood.  Pt was open with sharing the reason for entering the hospital.  Pt states that he brought himself to the hospital and this is the first time he's came for help.  Pt states that he has been unable to sleep at night for the past 3 months.  Pt states he started getting over the counter sleep meds 2 weeks ago.  Pt states that he used cocaine last week and drinks 2 beers (80 oz.) at night to help sleep.  Pt lives at home with his wife and 3 children.  Pt feels inadequate because he is not working and is supposed to care for the home.  Pt has transportation.  Pt states that he is on disability and doesn't work.  Pt has never been seen by a psychiatrist and therapist or been on medication for depression.  SW will refer pt to [facility] for med management and therapy.  Safety planning and suicide prevention discussed.    Phillip Turner, LCSWA 03/26/2011  10:13 AM

## 2011-03-26 NOTE — H&P (Signed)
  I have assessed the patient and reviewed the physical exam from the emergency department. Agree with findings except for patient being tearful at this time. He does however present with depressed affect.

## 2011-03-26 NOTE — Progress Notes (Signed)
Pt is new to the unit today.  Admitted for depression with SI.  Pt reports he has not been able to sleep for several days.  He has been hearing voices and having "bad" images in his head.  Pt denies thoughts of self harm presently.  At this time he wants to know about his wallet.  This Clinical research associate checked admission to see if the wallet is in the locker.  No mention of a wallet is included in pt's admission.  Will inform morning shift of pt's concerns.  Pt also inquired about something for sleep.  On call MD was notified and order received for Seroquel 100mg  one time to be given at bedtime.  Pt has been cooperative and is visible on the unit.  Has been in the dayroom and interacting with other patients.  Safety maintained with q15 minute checks.

## 2011-03-26 NOTE — ED Provider Notes (Signed)
History     CSN: [id number] Arrival date & time: 03/25/2011  2:49 PM   First MD Initiated Contact with Patient 03/26/11 2331      Chief Complaint  Patient presents with  . Labs Only    Pt was sent to from behavioral health with potassium of 2.6. Pt with no complaints. Sitter at bedside    (Consider location/radiation/quality/duration/timing/severity/associated sxs/prior treatment) HPI Pt was sent to the hospital because of a low potassium.  The patient is an inpatient at [facility] behavior health being treated for behavioral health problems. He denies any issues with that currently.  Pt denies complaints other than muscle cramping.  He denies vomiting or diarrhea.  No chest pain.  He has been taking potassium supplements.  They noticed the low potassium today.  The symptoms are mild.     Past Medical History  Diagnosis Date  . Diabetes mellitus   . Hypertension   . Stroke   . Hypothyroidism   . Anxiety   . Depression     Past Surgical History  Procedure Date  . Tonsillectomy     History reviewed. No pertinent family history.  History  Substance Use Topics  . Smoking status: Former Games developer  . Smokeless tobacco: Not on file  . Alcohol Use: 16.8 oz/week    28 Cans of beer per week     Pt consumes beers occasionally      Review of Systems  All other systems reviewed and are negative.    Allergies  Shellfish allergy  Home Medications   Current Outpatient Rx  Name Route Sig Dispense Refill  . ASPIRIN 325 MG PO TABS Oral Take 325 mg by mouth daily.      . COLCHICINE 0.6 MG PO TABS Oral Take 0.6 mg by mouth daily. Gout Flare    . DILTIAZEM HCL COATED BEADS 360 MG PO CP24 Oral Take 360 mg by mouth daily.      . GLYBURIDE-METFORMIN 5-500 MG PO TABS Oral Take 1 tablet by mouth 3 (three) times daily.      Marland Kitchen LEVOTHYROXINE SODIUM 200 MCG PO TABS Oral Take 200 mcg by mouth daily.      Marland Kitchen LISINOPRIL 10 MG PO TABS Oral Take 10 mg by mouth daily.      Marland Kitchen POTASSIUM  CHLORIDE 10 MEQ PO TBCR Oral Take 10 mEq by mouth daily.      Marland Kitchen SIMVASTATIN 20 MG PO TABS Oral Take 20 mg by mouth daily.        BP 142/74  Pulse 79  Temp(Src) 98.9 F (37.2 C) (Oral)  Resp 20  Ht 5\' 8"  (1.727 m)  Wt 281 lb (127.461 kg)  BMI 42.73 kg/m2  SpO2 99%  Physical Exam  Nursing note and vitals reviewed. Constitutional: He appears well-developed and well-nourished. No distress.  HENT:  Head: Normocephalic and atraumatic.  Right Ear: External ear normal.  Left Ear: External ear normal.  Eyes: Conjunctivae are normal. Right eye exhibits no discharge. Left eye exhibits no discharge. No scleral icterus.  Neck: Neck supple. No tracheal deviation present.  Cardiovascular: Normal rate, regular rhythm and intact distal pulses.   Pulmonary/Chest: Effort normal and breath sounds normal. No stridor. No respiratory distress. He has no wheezes. He has no rales.  Abdominal: Soft. Bowel sounds are normal. He exhibits no distension. There is no tenderness. There is no rebound and no guarding.  Musculoskeletal: He exhibits no edema and no tenderness.  Neurological: He is alert. He has normal  strength. No sensory deficit. Cranial nerve deficit:  no gross defecits noted. He exhibits normal muscle tone. He displays no seizure activity. Coordination normal.  Skin: Skin is warm and dry. No rash noted.  Psychiatric: He has a normal mood and affect.    ED Course  Procedures (including critical care time)  Date: 03/27/2011  Rate: 74  Rhythm: normal sinus rhythm  QRS Axis: normal  Intervals: normal  ST/T Wave abnormalities: nonspecific T wave changes  Conduction Disutrbances:none  Narrative Interpretation:   Old EKG Reviewed: none available   Labs Reviewed  GLUCOSE, CAPILLARY - Abnormal; Notable for the following:    Glucose-Capillary 111 (*)    All other components within normal limits  POTASSIUM - Abnormal; Notable for the following:    Potassium 2.8 (*)    All other components  within normal limits  GLUCOSE, CAPILLARY - Abnormal; Notable for the following:    Glucose-Capillary 168 (*)    All other components within normal limits  COMPREHENSIVE METABOLIC PANEL - Abnormal; Notable for the following:    Potassium 2.6 (*)    Glucose, Bld 130 (*)    BUN 24 (*)    Creatinine, Ser 1.81 (*)    AST 52 (*)    ALT 62 (*)    GFR calc non Af Amer 42 (*)    GFR calc Af Amer 49 (*)    All other components within normal limits  GLUCOSE, CAPILLARY - Abnormal; Notable for the following:    Glucose-Capillary 133 (*)    All other components within normal limits  GLUCOSE, CAPILLARY - Abnormal; Notable for the following:    Glucose-Capillary 118 (*)    All other components within normal limits  MAGNESIUM  MAGNESIUM  POCT CBG MONITORING  TSH  T3, FREE  T4, FREE   No results found.   1. Hypokalemia     CRITICAL CARE Performed by: Linwood Dibbles R   Total critical care time: 50 min  Critical care time was exclusive of separately billable procedures and treating other patients.  Critical care was necessary to treat or prevent imminent or life-threatening deterioration.  Critical care was time spent personally by me on the following activities: development of treatment plan with patient and/or surrogate as well as nursing, discussions with consultants, evaluation of patient's response to treatment, examination of patient, obtaining history from patient or surrogate, ordering and performing treatments and interventions, ordering and review of laboratory studies, ordering and review of radiographic studies, pulse oximetry and re-evaluation of patient's condition.   MDM  Patient with asymptomatic hypokalemia. This most likely related to his medications including the metalozone.  Patient has been given IV potassium infusion along with oral potassium.  4:31 AM repeat potassium level is still low. Patient will be given another infusion of potassium and level will be rechecked.   Once level is above 3.0 he should be able to return to [facility] with potassium oral supplementation        Celene Kras, MD 03/27/11 [phone]

## 2011-03-27 ENCOUNTER — Encounter (HOSPITAL_COMMUNITY): Payer: Self-pay

## 2011-03-27 ENCOUNTER — Other Ambulatory Visit: Payer: Self-pay

## 2011-03-27 DIAGNOSIS — F32A Depression, unspecified: Secondary | ICD-10-CM | POA: Diagnosis present

## 2011-03-27 DIAGNOSIS — R413 Other amnesia: Secondary | ICD-10-CM | POA: Insufficient documentation

## 2011-03-27 DIAGNOSIS — F329 Major depressive disorder, single episode, unspecified: Secondary | ICD-10-CM | POA: Diagnosis present

## 2011-03-27 DIAGNOSIS — Z8673 Personal history of transient ischemic attack (TIA), and cerebral infarction without residual deficits: Secondary | ICD-10-CM | POA: Insufficient documentation

## 2011-03-27 DIAGNOSIS — E876 Hypokalemia: Secondary | ICD-10-CM | POA: Diagnosis present

## 2011-03-27 LAB — GLUCOSE, CAPILLARY: Glucose-Capillary: 140 mg/dL — ABNORMAL HIGH (ref 70–99)

## 2011-03-27 LAB — POCT I-STAT, CHEM 8
Calcium, Ion: 1.13 mmol/L (ref 1.12–1.32)
Chloride: 104 mEq/L (ref 96–112)
HCT: 42 % (ref 39.0–52.0)
Potassium: 2.8 mEq/L — ABNORMAL LOW (ref 3.5–5.1)
Sodium: 140 mEq/L (ref 135–145)

## 2011-03-27 LAB — POTASSIUM: Potassium: 2.9 mEq/L — ABNORMAL LOW (ref 3.5–5.1)

## 2011-03-27 MED ORDER — INSULIN ASPART 100 UNIT/ML ~~LOC~~ SOLN
0.0000 [IU] | Freq: Three times a day (TID) | SUBCUTANEOUS | Status: DC
  Administered 2011-03-28: 3 [IU] via SUBCUTANEOUS
  Administered 2011-03-28: 2 [IU] via SUBCUTANEOUS
  Filled 2011-03-27: qty 3

## 2011-03-27 MED ORDER — DILTIAZEM HCL ER COATED BEADS 360 MG PO CP24: 360.0000 mg | ORAL_CAPSULE | Freq: Every day | ORAL | Status: DC

## 2011-03-27 MED ORDER — INSULIN ASPART 100 UNIT/ML ~~LOC~~ SOLN
0.0000 [IU] | Freq: Three times a day (TID) | SUBCUTANEOUS | Status: DC
  Administered 2011-03-27: 2 [IU] via SUBCUTANEOUS

## 2011-03-27 MED ORDER — METOLAZONE 5 MG PO TABS
5.0000 mg | ORAL_TABLET | Freq: Every day | ORAL | Status: DC
Start: 2011-03-27 — End: 2011-03-27

## 2011-03-27 MED ORDER — LISINOPRIL 10 MG PO TABS: 10.0000 mg | ORAL_TABLET | Freq: Every day | ORAL | Status: DC

## 2011-03-27 MED ORDER — FEBUXOSTAT 40 MG PO TABS: 40.0000 mg | ORAL_TABLET | Freq: Every day | ORAL | Status: DC

## 2011-03-27 MED ORDER — QUETIAPINE FUMARATE 50 MG PO TABS: 50.0000 mg | ORAL_TABLET | Freq: Every day | ORAL | Status: DC

## 2011-03-27 MED ORDER — INSULIN GLARGINE 100 UNIT/ML ~~LOC~~ SOLN: 15.0000 [IU] | Freq: Every day | SUBCUTANEOUS | Status: DC

## 2011-03-27 MED ORDER — FUROSEMIDE 20 MG PO TABS: 20.0000 mg | ORAL_TABLET | Freq: Every day | ORAL | Status: DC

## 2011-03-27 MED ORDER — SIMVASTATIN 20 MG PO TABS: 20.0000 mg | ORAL_TABLET | Freq: Every day | ORAL | Status: DC

## 2011-03-27 MED ORDER — POTASSIUM CHLORIDE 10 MEQ/100ML IV SOLN
10.0000 meq | INTRAVENOUS | Status: AC
  Administered 2011-03-27 (×2): 10 meq via INTRAVENOUS
  Filled 2011-03-27: qty 200

## 2011-03-27 MED ORDER — INSULIN ASPART 100 UNIT/ML ~~LOC~~ SOLN: 0.0000 [IU] | Freq: Every day | SUBCUTANEOUS | Status: DC

## 2011-03-27 MED ORDER — LEVOTHYROXINE SODIUM 200 MCG PO TABS: 200.0000 ug | ORAL_TABLET | Freq: Every day | ORAL | Status: DC

## 2011-03-27 MED ORDER — SERTRALINE HCL 50 MG PO TABS: 50.0000 mg | ORAL_TABLET | Freq: Every day | ORAL | Status: DC

## 2011-03-27 NOTE — ED Notes (Signed)
Patient received awake alert orientated x 4 -didn't get dinner tray given a sandwich /fruit and drink.  Patient asked about getting seroquel for sleep and checked that it is on his [facility]

## 2011-03-27 NOTE — ED Provider Notes (Addendum)
10:40 AM patient resting comfortably no complaint. Potassium remains at 2.9 after potassium supplementation in the emergency department. Spoke with Dr.Matthews. Plan obs /telemtry, posassium supplementation  Doug Sou, MD 03/27/11 1044  Doug Sou, MD 03/27/11 1045

## 2011-03-27 NOTE — Progress Notes (Signed)
Per State Regulation 482.30  This chart was reviewed for medical necessity with respect to the patient's Admission/Duration of stay.    Ambrose Mantle, LCSW  03/27/2011  Next review due:  03/30/11  Ambrose Mantle, LCSW 03/27/2011, 2:21 PM

## 2011-03-27 NOTE — ED Notes (Signed)
Sitter remains at bedside

## 2011-03-27 NOTE — ED Notes (Signed)
Pt resting on stretcher. Pt denies any needs or concerns. Pt cont to await further dispo. Will cont to monitor

## 2011-03-27 NOTE — H&P (Signed)
Hospital Admission Note Date: 03/27/2011  Patient name: Phillip Turner Medical record number: [id number] Date of birth: Sep 01, 1960 Age: 50 y.o. Gender: male PCP: No primary provider on file.  Attending physician: Lawrence Marseilles. Ashley Royalty  Chief Complaint: Low potassium History of Present Illness: [name] is a very pleasant 50 year old gentleman with a history of CVA and hypertension who is on 2 diuretics. 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 Our during his stay at behavior health and was noted to have a low potassium. The patient was sent to the emergency room for potassium replacement, however despite several doses of potassium the patient's potassium is continued to stay below 3.0. We are asked to admit the patient for further management of his hypokalemia. The patient denies any chest pain he denies any palpitations, dizziness, nausea vomiting, fever chills, diarrhea. He states that he takes both Lasix and Zaroxolyn and potassium supplementation. He denies to be compliant with his potassium. The patient states that on his last visit with Dr. Gery Pray 3 months ago he was told that his potassium was low. The patient has a visit scheduled to see Dr. Mathis Fare at the end of this month.  Scheduled Meds:   . aspirin  325 mg Oral Daily  . colchicine  0.6 mg Oral Daily  . diltiazem  360 mg Oral Daily  . febuxostat  40 mg Oral Daily  . furosemide  20 mg Oral Daily  . metFORMIN  500 mg Oral TID WC   And  . glyBURIDE  5 mg Oral TID WC  . levothyroxine  200 mcg Oral Daily  . lisinopril  10 mg Oral Daily  . metolazone  5 mg Oral Daily  . potassium chloride  10 mEq Intravenous Q1 Hr x 2  . potassium chloride  10 mEq Intravenous Q1 Hr x 2  . potassium chloride  40 mEq Oral Once  . potassium chloride  20 mEq Oral BID  . QUEtiapine  50 mg Oral QHS  . sertraline  50 mg Oral Daily  . simvastatin  20 mg Oral Daily  . DISCONTD:  glyBURIDE-metformin  1 tablet Oral TID   Continuous Infusions:  PRN Meds:.acetaminophen, alum & mag hydroxide-simeth, indomethacin, magnesium hydroxide Allergies: Shellfish allergy Past Medical History  Diagnosis Date  . Diabetes mellitus   . Hypertension   . Stroke   . Hypothyroidism   . Anxiety   . Depression    Past Surgical History  Procedure Date  . Tonsillectomy    History reviewed. No pertinent family history. History   Social History  . Marital Status: Married    Spouse Name: N/A    Number of Children: N/A  . Years of Education: N/A   Occupational History  . Not on file.   Social History Main Topics  . Smoking status: Former Games developer  . Smokeless tobacco: Never Used  . Alcohol Use: 16.8 oz/week    28 Cans of beer per week     Pt consumes beers occasionally  . Drug Use: Yes    Special: "Crack" cocaine, Cocaine     Cocaine  . Sexually Active:    Other Topics Concern  . Not on file   Social History Narrative  . No narrative on file   Review of Systems: A comprehensive review of systems was negative except for as noted in the history of present illness. Physical Exam:  General: Alert, awake, oriented x3, in no acute distress.  HEENT: Fate/AT  PEERL, EOMI Neck: Trachea midline,  no masses, no thyromegal,y no JVD, no carotid bruit OROPHARYNX:  Moist, No exudate/ erythema/lesions.  Heart: Regular rate and rhythm, without murmurs, rubs, gallops, PMI non-displaced, no heaves or thrills on palpation.  Lungs: Clear to auscultation, no wheezing or rhonchi noted. No increased vocal fremitus resonant to percussion  Abdomen: Soft, nontender, nondistended, positive bowel sounds, no masses no hepatosplenomegaly noted..  Neuro: No focal neurological deficits noted cranial nerves II through XII grossly intact. DTRs 2+ bilaterally upper and lower extremities. Strength 5 out of 5 in bilateral upper and lower extremities. Musculoskeletal: No warm swelling or erythema around  joints, no spinal tenderness noted. Psychiatric: Patient alert and oriented x3, good insight and cognition, good recent to remote recall. Lymph node survey: No cervical axillary or inguinal lymphadenopathy noted.  Lab results:  Basename 03/27/11 0915 03/27/11 0329 03/27/11 0003 03/26/11 1952 03/26/11 1930 03/26/11 0625 03/24/11 1509  NA -- -- 140 140 -- -- --  K 2.9* 2.9* -- -- -- -- --  CL -- -- 104 100 -- -- --  CO2 -- -- -- 27 -- -- 26  GLUCOSE -- -- 133* 130* -- -- --  BUN -- -- 23 24* -- -- --  CREATININE -- -- 1.70* 1.81* -- -- --  CALCIUM -- -- -- 9.4 -- -- 9.0  MG -- -- -- -- 2.0 2.0 --  PHOS -- -- -- -- -- -- --    Basename 03/26/11 1952 03/24/11 1509  AST 52* 54*  ALT 62* 62*  ALKPHOS 58 57  BILITOT 0.6 0.9  PROT 7.1 7.3  ALBUMIN 3.8 4.1   No results found for this basename: LIPASE:2,AMYLASE:2 in the last 72 hours  Basename 03/27/11 0003 03/24/11 1509  WBC -- 7.6  NEUTROABS -- --  HGB 14.3 14.5  HCT 42.0 40.8  MCV -- 80.2  PLT -- 309   No results found for this basename: CKTOTAL:3,CKMB:3,CKMBINDEX:3,TROPONINI:3 in the last 72 hours No results found for this basename: POCBNP:3 in the last 72 hours No results found for this basename: DDIMER:2 in the last 72 hours No results found for this basename: HGBA1C:2 in the last 72 hours No results found for this basename: CHOL:2,HDL:2,LDLCALC:2,TRIG:2,CHOLHDL:2,LDLDIRECT:2 in the last 72 hours  Basename 03/26/11 1952  TSH 2.934  T4TOTAL --  T3FREE 3.0  THYROIDAB --   No results found for this basename: VITAMINB12:2,FOLATE:2,FERRITIN:2,TIBC:2,IRON:2,RETICCTPCT:2 in the last 72 hours Imaging results:  No results found.     Patient Active Hospital Problem List: Alcohol dependence (03/26/2011)   Assessment: Patient states that his last drink was approximately one week ago. He states that he does not drink habitually but binge drinks. The patient from the records has not been treated for alcohol withdrawal behavior  health and currently exhibits no signs of withdrawal. I believe this to be a nonissue at this time with the patient.   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 I will ask psychiatry to see the patient here in the hospital to continue his treatments as indicated per their assessment.    Hypokalemia (03/27/2011)   Assessment: The patient is significantly hypokalemic which is likely secondary to diuretic effect. The patient also may have a component of renal tubular acidosis associated with his diabetes. We will replace the potassium by IV. Please note his magnesium has been evaluated and found to be within normal limits. It is likely  the patient will need a much higher dose of potassium at discharge than his prehospital prescribed 10 mEq.    Depression (03/27/2011)   Assessment: Per psychiatry    DVT prophylaxis with Lovenox.  This patient has been seen and examined. I am on presently unable to and or admission orders on this patient as the patient has not been officially charged for behavioral health. I will enter admission orders as soon as admission processing allows.  Zaeem Kandel A. 03/27/2011, 11:54 AM

## 2011-03-27 NOTE — ED Notes (Signed)
Patient's room cancelled will remain in ed sitter unavailable after 2315. Will watch patient from desk area .  Charge nurse notified and she will notify the [facility]

## 2011-03-28 ENCOUNTER — Inpatient Hospital Stay (HOSPITAL_COMMUNITY)
Admission: AD | Admit: 2011-03-28 | Discharge: 2011-03-31 | DRG: 641 | Disposition: A | Discharge status: Home / Self Care | Payer: No Typology Code available for payment source | Source: Other Acute Inpatient Hospital | Attending: Internal Medicine | Admitting: Internal Medicine

## 2011-03-28 ENCOUNTER — Encounter (HOSPITAL_COMMUNITY): Payer: Self-pay | Admitting: *Deleted

## 2011-03-28 DIAGNOSIS — F329 Major depressive disorder, single episode, unspecified: Secondary | ICD-10-CM | POA: Diagnosis present

## 2011-03-28 DIAGNOSIS — E876 Hypokalemia: Principal | ICD-10-CM | POA: Diagnosis present

## 2011-03-28 DIAGNOSIS — F1020 Alcohol dependence, uncomplicated: Secondary | ICD-10-CM | POA: Diagnosis present

## 2011-03-28 DIAGNOSIS — F32A Depression, unspecified: Secondary | ICD-10-CM | POA: Diagnosis present

## 2011-03-28 DIAGNOSIS — F3289 Other specified depressive episodes: Secondary | ICD-10-CM | POA: Diagnosis present

## 2011-03-28 DIAGNOSIS — R45851 Suicidal ideations: Secondary | ICD-10-CM

## 2011-03-28 DIAGNOSIS — IMO0002 Reserved for concepts with insufficient information to code with codable children: Secondary | ICD-10-CM | POA: Diagnosis present

## 2011-03-28 DIAGNOSIS — F1410 Cocaine abuse, uncomplicated: Secondary | ICD-10-CM | POA: Diagnosis present

## 2011-03-28 DIAGNOSIS — E039 Hypothyroidism, unspecified: Secondary | ICD-10-CM | POA: Diagnosis present

## 2011-03-28 DIAGNOSIS — Z8673 Personal history of transient ischemic attack (TIA), and cerebral infarction without residual deficits: Secondary | ICD-10-CM

## 2011-03-28 DIAGNOSIS — E118 Type 2 diabetes mellitus with unspecified complications: Secondary | ICD-10-CM | POA: Diagnosis present

## 2011-03-28 DIAGNOSIS — E119 Type 2 diabetes mellitus without complications: Secondary | ICD-10-CM | POA: Diagnosis present

## 2011-03-28 LAB — CBC
MCHC: 35.2 g/dL (ref 30.0–36.0)
Platelets: 281 10*3/uL (ref 150–400)
RDW: 14.4 % (ref 11.5–15.5)
WBC: 11.3 10*3/uL — ABNORMAL HIGH (ref 4.0–10.5)

## 2011-03-28 LAB — BASIC METABOLIC PANEL
BUN: 12 mg/dL (ref 6–23)
CO2: 26 mEq/L (ref 19–32)
Calcium: 10.1 mg/dL (ref 8.4–10.5)
Chloride: 97 mEq/L (ref 96–112)
Creatinine, Ser: 1.38 mg/dL — ABNORMAL HIGH (ref 0.50–1.35)

## 2011-03-28 LAB — CREATININE, SERUM
Creatinine, Ser: 1.31 mg/dL (ref 0.50–1.35)
GFR calc non Af Amer: 62 mL/min — ABNORMAL LOW (ref >90)

## 2011-03-28 LAB — POTASSIUM
Potassium: 2.9 mEq/L — ABNORMAL LOW (ref 3.5–5.1)
Potassium: 2.9 mEq/L — ABNORMAL LOW (ref 3.5–5.1)

## 2011-03-28 LAB — GLUCOSE, CAPILLARY: Glucose-Capillary: 159 mg/dL — ABNORMAL HIGH (ref 70–99)

## 2011-03-28 LAB — HEMOGLOBIN A1C: Hgb A1c MFr Bld: 5.8 % — ABNORMAL HIGH (ref <5.7)

## 2011-03-28 MED ORDER — POTASSIUM CHLORIDE 10 MEQ/100ML IV SOLN
10.0000 meq | INTRAVENOUS | Status: AC
  Administered 2011-03-28 (×3): 10 meq via INTRAVENOUS

## 2011-03-28 MED ORDER — SENNA 8.6 MG PO TABS: 2.0000 | ORAL_TABLET | Freq: Every day | ORAL | Status: DC | PRN

## 2011-03-28 MED ORDER — SODIUM CHLORIDE 0.9 % IJ SOLN: 3.0000 mL | INTRAMUSCULAR | Status: DC | PRN

## 2011-03-28 MED ORDER — ENOXAPARIN SODIUM 40 MG/0.4ML ~~LOC~~ SOLN
40.0000 mg | Freq: Every day | SUBCUTANEOUS | Status: DC
  Administered 2011-03-28: 40 mg via SUBCUTANEOUS
  Filled 2011-03-28 (×2): qty 0.4

## 2011-03-28 MED ORDER — COLCHICINE 0.6 MG PO TABS
0.6000 mg | ORAL_TABLET | Freq: Every day | ORAL | Status: DC
  Administered 2011-03-29 – 2011-03-31 (×3): 0.6 mg via ORAL
  Filled 2011-03-28 (×4): qty 1

## 2011-03-28 MED ORDER — POLYETHYLENE GLYCOL 3350 17 G PO PACK
17.0000 g | PACK | Freq: Every day | ORAL | Status: DC
  Administered 2011-03-28 – 2011-03-31 (×4): 17 g via ORAL
  Filled 2011-03-28 (×4): qty 1

## 2011-03-28 MED ORDER — ASPIRIN 325 MG PO TABS
325.0000 mg | ORAL_TABLET | Freq: Every day | ORAL | Status: DC
  Administered 2011-03-29 – 2011-03-31 (×3): 325 mg via ORAL
  Filled 2011-03-28 (×4): qty 1

## 2011-03-28 MED ORDER — SODIUM CHLORIDE 0.9 % IJ SOLN
3.0000 mL | Freq: Two times a day (BID) | INTRAMUSCULAR | Status: DC
  Administered 2011-03-28 – 2011-03-31 (×4): 3 mL via INTRAVENOUS

## 2011-03-28 MED ORDER — ACETAMINOPHEN 325 MG PO TABS: 650.0000 mg | ORAL_TABLET | Freq: Four times a day (QID) | ORAL | Status: DC | PRN

## 2011-03-28 MED ORDER — VITAMIN B-1 100 MG PO TABS
100.0000 mg | ORAL_TABLET | Freq: Every day | ORAL | Status: DC
  Administered 2011-03-29 – 2011-03-31 (×3): 100 mg via ORAL
  Filled 2011-03-28 (×4): qty 1

## 2011-03-28 MED ORDER — ACETAMINOPHEN 650 MG RE SUPP: 650.0000 mg | Freq: Four times a day (QID) | RECTAL | Status: DC | PRN

## 2011-03-28 MED ORDER — OXYCODONE HCL 5 MG PO TABS: 5.0000 mg | ORAL_TABLET | ORAL | Status: DC | PRN

## 2011-03-28 MED ORDER — SODIUM CHLORIDE 0.9 % IV SOLN: 250.0000 mL | INTRAVENOUS | Status: DC

## 2011-03-28 MED ORDER — POTASSIUM CHLORIDE CRYS ER 20 MEQ PO TBCR
60.0000 meq | EXTENDED_RELEASE_TABLET | Freq: Two times a day (BID) | ORAL | Status: DC
  Administered 2011-03-28 (×2): 60 meq via ORAL

## 2011-03-28 MED ORDER — ONDANSETRON HCL 4 MG PO TABS: 4.0000 mg | ORAL_TABLET | Freq: Four times a day (QID) | ORAL | Status: DC | PRN

## 2011-03-28 MED ORDER — FOLIC ACID 1 MG PO TABS
1.0000 mg | ORAL_TABLET | Freq: Every day | ORAL | Status: DC
  Administered 2011-03-28 – 2011-03-31 (×4): 1 mg via ORAL
  Filled 2011-03-28 (×4): qty 1

## 2011-03-28 MED ORDER — ONDANSETRON HCL 4 MG/2ML IJ SOLN: 4.0000 mg | Freq: Four times a day (QID) | INTRAMUSCULAR | Status: DC | PRN

## 2011-03-28 NOTE — ED Notes (Signed)
patient sleeping soundly

## 2011-03-28 NOTE — ED Notes (Signed)
Report given to Felicia RN.

## 2011-03-28 NOTE — ED Notes (Signed)
Patient awakened after talking with Dr Gery Pray re: hypokalemia.  Additional dose of K-Dur ordered and given

## 2011-03-28 NOTE — ED Notes (Signed)
Continues to sleep , labs to be drawn, continues to run sinus brady

## 2011-03-28 NOTE — ED Notes (Signed)
New sitter arrived for patient.  Patient continues to rest comfortably

## 2011-03-28 NOTE — ED Notes (Signed)
Up to BR to void

## 2011-03-28 NOTE — ED Notes (Signed)
Patient continues to sleep w/o voiced needs

## 2011-03-29 ENCOUNTER — Other Ambulatory Visit: Payer: Self-pay

## 2011-03-29 DIAGNOSIS — E119 Type 2 diabetes mellitus without complications: Secondary | ICD-10-CM | POA: Diagnosis present

## 2011-03-29 DIAGNOSIS — E118 Type 2 diabetes mellitus with unspecified complications: Secondary | ICD-10-CM | POA: Diagnosis present

## 2011-03-29 DIAGNOSIS — E039 Hypothyroidism, unspecified: Secondary | ICD-10-CM | POA: Diagnosis present

## 2011-03-29 DIAGNOSIS — F339 Major depressive disorder, recurrent, unspecified: Secondary | ICD-10-CM

## 2011-03-29 DIAGNOSIS — E1165 Type 2 diabetes mellitus with hyperglycemia: Secondary | ICD-10-CM | POA: Diagnosis present

## 2011-03-29 LAB — CREATININE, SERUM: Creatinine, Ser: 1.35 mg/dL (ref 0.50–1.35)

## 2011-03-29 LAB — CBC
MCH: 29 pg (ref 26.0–34.0)
MCHC: 35.3 g/dL (ref 30.0–36.0)
MCV: 82.2 fL (ref 78.0–100.0)
Platelets: 280 10*3/uL (ref 150–400)

## 2011-03-29 LAB — MAGNESIUM: Magnesium: 1.7 mg/dL (ref 1.5–2.5)

## 2011-03-29 LAB — GLUCOSE, CAPILLARY: Glucose-Capillary: 151 mg/dL — ABNORMAL HIGH (ref 70–99)

## 2011-03-29 LAB — POTASSIUM: Potassium: 2.7 mEq/L — CL (ref 3.5–5.1)

## 2011-03-29 LAB — BASIC METABOLIC PANEL
Calcium: 9.8 mg/dL (ref 8.4–10.5)
Creatinine, Ser: 1.22 mg/dL (ref 0.50–1.35)
GFR calc non Af Amer: 68 mL/min — ABNORMAL LOW (ref >90)
Sodium: 135 mEq/L (ref 135–145)

## 2011-03-29 LAB — HEMOGLOBIN A1C: Hgb A1c MFr Bld: 5.6 % (ref <5.7)

## 2011-03-29 MED ORDER — MIRTAZAPINE 15 MG PO TABS
15.0000 mg | ORAL_TABLET | Freq: Every day | ORAL | Status: DC
  Administered 2011-03-29 – 2011-03-30 (×2): 15 mg via ORAL
  Filled 2011-03-29 (×3): qty 1

## 2011-03-29 MED ORDER — POTASSIUM CHLORIDE CRYS ER 20 MEQ PO TBCR
40.0000 meq | EXTENDED_RELEASE_TABLET | ORAL | Status: AC
  Administered 2011-03-29 (×2): 40 meq via ORAL
  Filled 2011-03-29 (×2): qty 2

## 2011-03-29 MED ORDER — SODIUM CHLORIDE 0.9 % IJ SOLN: 3.0000 mL | INTRAMUSCULAR | Status: DC | PRN

## 2011-03-29 MED ORDER — POTASSIUM CHLORIDE CRYS ER 20 MEQ PO TBCR
40.0000 meq | EXTENDED_RELEASE_TABLET | ORAL | Status: DC
  Administered 2011-03-29 (×2): 40 meq via ORAL
  Filled 2011-03-29 (×4): qty 2

## 2011-03-29 MED ORDER — SODIUM CHLORIDE 0.9 % IV SOLN: 250.0000 mL | INTRAVENOUS | Status: DC

## 2011-03-29 MED ORDER — LISINOPRIL 10 MG PO TABS
10.0000 mg | ORAL_TABLET | Freq: Every day | ORAL | Status: DC
  Administered 2011-03-29 – 2011-03-31 (×3): 10 mg via ORAL
  Filled 2011-03-29 (×3): qty 1

## 2011-03-29 MED ORDER — INSULIN ASPART 100 UNIT/ML ~~LOC~~ SOLN: 0.0000 [IU] | Freq: Every day | SUBCUTANEOUS | Status: DC

## 2011-03-29 MED ORDER — ESCITALOPRAM OXALATE 5 MG PO TABS
5.0000 mg | ORAL_TABLET | Freq: Every day | ORAL | Status: DC
  Administered 2011-03-29 – 2011-03-31 (×3): 5 mg via ORAL
  Filled 2011-03-29 (×3): qty 1

## 2011-03-29 MED ORDER — ENOXAPARIN SODIUM 40 MG/0.4ML ~~LOC~~ SOLN: 40.0000 mg | SUBCUTANEOUS | Status: DC

## 2011-03-29 MED ORDER — SODIUM CHLORIDE 0.9 % IJ SOLN
3.0000 mL | Freq: Two times a day (BID) | INTRAMUSCULAR | Status: DC
  Administered 2011-03-30 – 2011-03-31 (×3): 3 mL via INTRAVENOUS

## 2011-03-29 MED ORDER — LEVOTHYROXINE SODIUM 200 MCG PO TABS
200.0000 ug | ORAL_TABLET | Freq: Every day | ORAL | Status: DC
  Administered 2011-03-30 – 2011-03-31 (×2): 200 ug via ORAL
  Filled 2011-03-29 (×2): qty 1

## 2011-03-29 MED ORDER — INSULIN ASPART 100 UNIT/ML ~~LOC~~ SOLN
0.0000 [IU] | Freq: Three times a day (TID) | SUBCUTANEOUS | Status: DC
  Administered 2011-03-29: 3 [IU] via SUBCUTANEOUS
  Administered 2011-03-29 – 2011-03-30 (×2): 2 [IU] via SUBCUTANEOUS
  Administered 2011-03-30: 3 [IU] via SUBCUTANEOUS
  Administered 2011-03-31: 2 [IU] via SUBCUTANEOUS
  Filled 2011-03-29: qty 3

## 2011-03-29 MED ORDER — DILTIAZEM HCL ER COATED BEADS 360 MG PO CP24
360.0000 mg | ORAL_CAPSULE | Freq: Every day | ORAL | Status: DC
  Filled 2011-03-29: qty 1

## 2011-03-29 MED ORDER — INSULIN GLARGINE 100 UNIT/ML ~~LOC~~ SOLN
15.0000 [IU] | Freq: Every day | SUBCUTANEOUS | Status: DC
  Administered 2011-03-29 – 2011-03-30 (×2): 15 [IU] via SUBCUTANEOUS
  Filled 2011-03-29: qty 3

## 2011-03-29 MED ORDER — ENOXAPARIN SODIUM 80 MG/0.8ML ~~LOC~~ SOLN
65.0000 mg | SUBCUTANEOUS | Status: DC
  Administered 2011-03-29 – 2011-03-31 (×3): 65 mg via SUBCUTANEOUS
  Filled 2011-03-29 (×3): qty 0.8

## 2011-03-29 NOTE — Progress Notes (Signed)
Subjective: Patient states that he feels better today. He is concerned that his hypokalemia may have been contributory to his state of depression. Objective: Filed Vitals:   03/28/11 1900 03/29/11 0619  BP: 168/89 138/81  Pulse: 63 56  Temp: 97.6 F (36.4 C) 98.1 F (36.7 C)  TempSrc: Oral Oral  Resp: 18 18  Height: 5\' 11"  (1.803 m)   Weight: 131.498 kg (289 lb 14.4 oz)   SpO2: 97% 98%   Weight change:   Intake/Output Summary (Last 24 hours) at 03/29/11 1246 Last data filed at 03/29/11 9604  Gross per 24 hour  Intake    120 ml  Output      3 ml  Net    117 ml    General: Alert, awake, oriented x3, in no acute distress.  HEENT: [place]/AT [place], EOMI Neck: Trachea midline,  no masses, no thyromegal,y no JVD, no carotid bruit OROPHARYNX:  Moist, No exudate/ erythema/lesions.  Heart: Regular rate and rhythm, without murmurs, rubs, gallops, PMI non-displaced, no heaves or thrills on palpation.  Lungs: Clear to auscultation, no wheezing or rhonchi noted. No increased vocal fremitus resonant to percussion  Abdomen: Soft, nontender, nondistended, positive bowel sounds, no masses no hepatosplenomegaly noted..  Neuro: No focal neurological deficits noted cranial nerves II through XII grossly intact. DTRs 2+ bilaterally upper and lower extremities. Strength  4/5 in bilateral upper and lower extremities. Musculoskeletal: No warm swelling or erythema around joints, no spinal tenderness noted. Psychiatric: Patient alert and oriented x3. Lymph node survey: No cervical axillary or inguinal lymphadenopathy noted.     Lab Results:  Basename 03/29/11 1020 03/29/11 0750 03/28/11 1520 03/28/11 0824 03/26/11 1930  NA -- 135 -- 136 --  K -- 2.7* 2.9* -- --  CL -- 97 -- 97 --  CO2 -- 26 -- 26 --  GLUCOSE -- 156* -- 168* --  BUN -- 10 -- 12 --  CREATININE 1.35 1.22 -- -- --  CALCIUM -- 9.8 -- 10.1 --  MG 1.7 -- -- -- 2.0  PHOS -- -- -- -- --    Basename 03/26/11 1952  AST 52*  ALT 62*    ALKPHOS 58  BILITOT 0.6  PROT 7.1  ALBUMIN 3.8   No results found for this basename: LIPASE:2,AMYLASE:2 in the last 72 hours  Basename 03/29/11 1020 03/28/11 2109  WBC 11.0* 11.3*  NEUTROABS -- --  HGB 14.5 13.7  HCT 41.1 38.9*  MCV 82.2 82.2  PLT 280 281   No results found for this basename: CKTOTAL:3,CKMB:3,CKMBINDEX:3,TROPONINI:3 in the last 72 hours No results found for this basename: POCBNP:3 in the last 72 hours No results found for this basename: DDIMER:2 in the last 72 hours  Basename 03/27/11 2310  HGBA1C 5.8*   No results found for this basename: CHOL:2,HDL:2,LDLCALC:2,TRIG:2,CHOLHDL:2,LDLDIRECT:2 in the last 72 hours  Basename 03/26/11 1952  TSH 2.934  T4TOTAL --  T3FREE 3.0  THYROIDAB --   No results found for this basename: VITAMINB12:2,FOLATE:2,FERRITIN:2,TIBC:2,IRON:2,RETICCTPCT:2 in the last 72 hours  Micro Results: No results found for this or any previous visit (from the past 240 hour(s)).  Studies/Results: No results found.  Medications: I have reviewed the patient's current medications. Scheduled Meds:   . aspirin  325 mg Oral Daily  . colchicine  0.6 mg Oral Daily  . enoxaparin (LOVENOX) injection  65 mg Subcutaneous Q24H  . escitalopram  5 mg Oral Daily  . folic acid  1 mg Oral Daily  . insulin aspart  0-15 Units Subcutaneous  TID WC  . insulin aspart  0-5 Units Subcutaneous QHS  . insulin glargine  15 Units Subcutaneous QHS  . levothyroxine  200 mcg Oral QAC breakfast  . lisinopril  10 mg Oral Daily  . mirtazapine  15 mg Oral QHS  . polyethylene glycol  17 g Oral Daily  . potassium chloride  40 mEq Oral Q2H  . sodium chloride  3 mL Intravenous Q12H  . sodium chloride  3 mL Intravenous Q12H  . thiamine  100 mg Oral Daily  . DISCONTD: diltiazem  360 mg Oral Daily  . DISCONTD: enoxaparin  40 mg Subcutaneous Q24H   Continuous Infusions:   . sodium chloride    . sodium chloride     PRN Meds:.acetaminophen, acetaminophen, ondansetron  (ZOFRAN) IV, ondansetron, oxyCODONE, senna, sodium chloride, sodium chloride Assessment/Plan: Patient Active Hospital Problem List: Hypokalemia (03/27/2011)   Assessment: As hypokalemia persists. The etiology is unclear however it is likely the patient has had decreased potassium intake. Please note that the patient states that he was still approximately 3 months ago that he did have low potassium and has been compliant with the dosing of potassium with that she's been provided by his physicians. He's not had a follow up of his potassium evaluation since approximately 3 months ago.   Plan: We'll continue replacement.  Depression (03/27/2011)   Assessment: Consult from Dr. Rogers Blocker noted. The patient can be discharged home at the time of discharge and he has started him on Lexapro and Remeron.    Diabetes mellitus (03/29/2011)   Assessment: The patient's blood sugars adequately controlled at present. We'll check hemoglobin A1c for evaluation of long-term control.    Hypothyroidism (03/29/2011)   Assessment: Continue Synthroid at current dose      LOS: 1 day

## 2011-03-29 NOTE — Progress Notes (Signed)
Subjective: Please note that this note is a late entry off from the patient been seen yesterday. I was unable to document on the patient as [facility] However the patient was seen and evaluated once he arrived to room 1520 transferred from the emergency room. On yesterday I saw the patient at approximately 4:00 in the evening the patient states that he was feeling much better. He still stated that he felt like hurting himself however did not have a suicidal plan. Despite aggressive replacement of potassium his potassium is still low at 2.9. Orders were given for additional replacement and presently we are awaiting recheck of the potassium level. Objective: Filed Vitals:   03/28/11 1900 03/29/11 0619  BP: 168/89 138/81  Pulse: 63 56  Temp: 97.6 F (36.4 C) 98.1 F (36.7 C)  TempSrc: Oral Oral  Resp: 18 18  Height: 5\' 11"  (1.803 m)   Weight: 131.498 kg (289 lb 14.4 oz)   SpO2: 97% 98%   Weight change:   Intake/Output Summary (Last 24 hours) at 03/29/11 0850 Last data filed at 03/29/11 2130  Gross per 24 hour  Intake    120 ml  Output      3 ml  Net    117 ml    General: Alert, awake, oriented x3, in no acute distress.  HEENT: [place]/AT [place], EOMI Neck: Trachea midline,  no masses, no thyromegal,y no JVD, no carotid bruit OROPHARYNX:  Moist, No exudate/ erythema/lesions.  Heart: Regular rate and rhythm, without murmurs, rubs, gallops, PMI non-displaced, no heaves or thrills on palpation.  Lungs: Clear to auscultation, no wheezing or rhonchi noted. No increased vocal fremitus resonant to percussion  Abdomen: Soft, nontender, nondistended, positive bowel sounds, no masses no hepatosplenomegaly noted..  Neuro: No focal neurological deficits noted cranial nerves II through XII grossly intact. DTRs 2+ bilaterally upper and lower extremities. Strength 5 out of 5 in bilateral upper and lower extremities. Musculoskeletal: No warm swelling or  erythema around joints, no spinal tenderness noted. Psychiatric: Patient alert and oriented x3, good insight and cognition, good recent to remote recall. Lymph node survey: No cervical axillary or inguinal lymphadenopathy noted.     Lab Results:  Basename 03/28/11 2109 03/28/11 1520 03/28/11 1012 03/28/11 0824 03/27/11 0003 03/26/11 1952 03/26/11 1930  NA -- -- -- 136 140 -- --  K -- 2.9* 2.9* -- -- -- --  CL -- -- -- 97 104 -- --  CO2 -- -- -- 26 -- 27 --  GLUCOSE -- -- -- 168* 133* -- --  BUN -- -- -- 12 23 -- --  CREATININE 1.31 -- -- 1.38* -- -- --  CALCIUM -- -- -- 10.1 -- 9.4 --  MG -- -- -- -- -- -- 2.0  PHOS -- -- -- -- -- -- --    Basename 03/26/11 1952  AST 52*  ALT 62*  ALKPHOS 58  BILITOT 0.6  PROT 7.1  ALBUMIN 3.8   No results found for this basename: LIPASE:2,AMYLASE:2 in the last 72 hours  Basename 03/28/11 2109 03/27/11 0003  WBC 11.3* --  NEUTROABS -- --  HGB 13.7 14.3  HCT 38.9* 42.0  MCV 82.2 --  PLT 281 --   No results found for this basename: CKTOTAL:3,CKMB:3,CKMBINDEX:3,TROPONINI:3 in the last 72 hours No results found for this basename: POCBNP:3 in the last 72 hours No results found for this basename: DDIMER:2 in the last 72 hours  Basename 03/27/11 2310  HGBA1C  5.8*   No results found for this basename: CHOL:2,HDL:2,LDLCALC:2,TRIG:2,CHOLHDL:2,LDLDIRECT:2 in the last 72 hours  Basename 03/26/11 1952  TSH 2.934  T4TOTAL --  T3FREE 3.0  THYROIDAB --   No results found for this basename: VITAMINB12:2,FOLATE:2,FERRITIN:2,TIBC:2,IRON:2,RETICCTPCT:2 in the last 72 hours  Micro Results: No results found for this or any previous visit (from the past 240 hour(s)).  Studies/Results: No results found.  Medications: I have reviewed the patient's current medications. Scheduled Meds:   . aspirin  325 mg Oral Daily  . colchicine  0.6 mg Oral Daily  . diltiazem  360 mg Oral Daily  . folic acid  1 mg Oral Daily  . insulin aspart  0-15 Units  Subcutaneous TID WC  . insulin aspart  0-5 Units Subcutaneous QHS  . insulin glargine  15 Units Subcutaneous QHS  . levothyroxine  200 mcg Oral QAC breakfast  . lisinopril  10 mg Oral Daily  . polyethylene glycol  17 g Oral Daily  . sodium chloride  3 mL Intravenous Q12H  . thiamine  100 mg Oral Daily   Continuous Infusions:   . sodium chloride     PRN Meds:.acetaminophen, acetaminophen, ondansetron (ZOFRAN) IV, ondansetron, oxyCODONE, senna, sodium chloride Assessment/Plan: Patient Active Hospital Problem List: Hypokalemia (03/27/2011)   Assessment: The potassium level is still low. We'll await repeat potassium level and replete as necessary. Please note that the patient's magnesium levels are within normal limits.  Depression (03/27/2011)   Assessment: The patient has a very flat affect and continues to verbalize depression. I will consult scar to see the patient to assess the patient for need for IV admission to inpatient behavioral health once his potassiums have been repleted.    Diabetes mellitus (03/29/2011)   Assessment: The patient's Glucovance is being held at this time the patient has orders for Lantus and sliding scale insulin.    Hypothyroidism (03/29/2011)   Assessment: Patient has a TSH of 2.934 which is within normal limits. This indicates adequate supplementation. The patient will be continued on his Synthroid 200 mcg by mouth daily.   DVT prophylaxis with Lovenox. Patient will continue on telemetry monitoring to potassium is adequately repleted.   LOS: 1 day

## 2011-03-29 NOTE — Consults (Signed)
Patient Identification:  Phillip Turner Date of Evaluation:  03/29/2011   History of Present Illness:  50 year old African American male who was admitted from behavioral health to the medical floor because of the low potassium. Patient was admitted at behavioral health as he was feeling depressed because of the nightmares like  that he was walking [place] the [place]. he also related to this as he was no sleeping properly. Patient also used cocaine before admission to behavioral health after a long time of sobriety for 2 years.  Currently patient denies having any suicidal or homicidal ideations he is logical and goal-directed during interview not hallucinating or delusional. Patient reports that he is a Jehovah witness and have five weekly meetings that is going to help him but he don't want to go back to the behavioral health for the group therapy. Patient denies any past suicide attempt or admission to psychiatry. Patient also denied family history of suicide attempt.   patient is married for 20 years lives with wife and has 3 kids is on disability.   Past Medical History:     Past Medical History  Diagnosis Date  . Diabetes mellitus   . Hypertension   . Stroke   . Hypothyroidism   . Anxiety   . Depression        Past Surgical History  Procedure Date  . Tonsillectomy     Allergies:  Allergies  Allergen Reactions  . Shellfish Allergy Anaphylaxis and Nausea And Vomiting    Current Medications:  Prior to Admission medications   Medication Sig Start Date End Date Taking? Authorizing Provider  aspirin 325 MG tablet Take 325 mg by mouth daily.     Yes Historical Provider, MD  colchicine 0.6 MG tablet Take 0.6 mg by mouth daily. Gout Flare   Yes Historical Provider, MD  diltiazem (CARDIZEM CD) 360 MG 24 hr capsule Take 360 mg by mouth daily.     Yes Historical Provider, MD  febuxostat (ULORIC) 40 MG tablet Take 40 mg by mouth daily.     Yes Historical  Provider, MD  furosemide (LASIX) 20 MG tablet Take 20 mg by mouth daily.     Yes Historical Provider, MD  glyBURIDE-metformin (GLUCOVANCE) 5-500 MG per tablet Take 1 tablet by mouth 3 (three) times daily.     Yes Historical Provider, MD  levothyroxine (SYNTHROID, LEVOTHROID) 175 MCG tablet Take 175 mcg by mouth daily.     Yes Historical Provider, MD  lisinopril (PRINIVIL,ZESTRIL) 10 MG tablet Take 10 mg by mouth daily.     Yes Historical Provider, MD  metolazone (ZAROXOLYN) 5 MG tablet Take 5 mg by mouth daily.     Yes Historical Provider, MD  potassium chloride (KLOR-CON) 10 MEQ CR tablet Take 10 mEq by mouth daily as needed. Take with lasix   Yes Historical Provider, MD  simvastatin (ZOCOR) 20 MG tablet Take 20 mg by mouth daily.     Yes Historical Provider, MD  tadalafil (CIALIS) 20 MG tablet Take 20 mg by mouth daily as needed.      Historical Provider, MD    Social History:    reports that he has quit smoking. He has never used smokeless tobacco. He reports that he drinks about 16.8 ounces of alcohol per week. He reports that he uses illicit drugs ("Crack" cocaine and Cocaine).   Family History:    Family History  Problem Relation Age of Onset  . Coronary artery disease Mother   . Diabetes  type II Father   . Hypertension Father     Mental Status Examination/Evaluation: Objective:  Appearance: Fairly Groomed  Psychomotor Activity:  Normal  Eye Contact::  Good  Speech:  Normal Rate  Volume:  Normal  Mood:  Euthymic   Affect:  Congruent  Thought Process:  Logical and goal-directed   Orientation:  Full  Thought Content:  Not hallucinating or delusional   Suicidal Thoughts:  No  Homicidal Thoughts:  No  Judgement:  Intact  Insight:  Good    DIAGNOSIS:   AXIS I  depressive disorder NOS, cocaine abuse   AXIS II  Deffered  AXIS III See medical notes.  AXIS IV  unspecified.   AXIS V 51-60 moderate symptoms     Recommendations: 1 Patient can be discharged to followup in  the outpatient setting after making a family contact  With the wife to go over the safety concerns and suicide risk precautions.  2 I will start the patient on Lexapro 5 mg by mouth daily and Remeron 15 mg at bedtime to help him fall asleep. 3 patient should be provided with outpatient followup appointments.    Eulogio Ditch, MD

## 2011-03-30 ENCOUNTER — Other Ambulatory Visit: Payer: Self-pay

## 2011-03-30 LAB — BASIC METABOLIC PANEL
BUN: 12 mg/dL (ref 6–23)
CO2: 21 mEq/L (ref 19–32)
Calcium: 9.8 mg/dL (ref 8.4–10.5)
Creatinine, Ser: 1.39 mg/dL — ABNORMAL HIGH (ref 0.50–1.35)
Glucose, Bld: 115 mg/dL — ABNORMAL HIGH (ref 70–99)

## 2011-03-30 LAB — GLUCOSE, CAPILLARY
Glucose-Capillary: 145 mg/dL — ABNORMAL HIGH (ref 70–99)
Glucose-Capillary: 154 mg/dL — ABNORMAL HIGH (ref 70–99)

## 2011-03-30 MED ORDER — POTASSIUM CHLORIDE CRYS ER 20 MEQ PO TBCR
40.0000 meq | EXTENDED_RELEASE_TABLET | Freq: Two times a day (BID) | ORAL | Status: DC
  Administered 2011-03-30 – 2011-03-31 (×3): 40 meq via ORAL
  Filled 2011-03-30 (×4): qty 2

## 2011-03-30 NOTE — Progress Notes (Signed)
Subjective: Patient states that he feels better today. He complains of some weakness however states it has been there since he had his stroke.  Interval history: The patient's potassium is found within normal ranges after oral supplementation and IV supplementation. Objective: Filed Vitals:   03/29/11 0619 03/29/11 1450 03/29/11 2200 03/30/11 0534  BP: 138/81 162/91 146/81 137/68  Pulse: 56 78 67 61  Temp: 98.1 F (36.7 C) 97.9 F (36.6 C) 97.9 F (36.6 C)   TempSrc: Oral Oral Oral Oral  Resp: 18 18 18 20   Height:      Weight:      SpO2: 98% 95% 99% 95%   Weight change:   Intake/Output Summary (Last 24 hours) at 03/30/11 1316 Last data filed at 03/30/11 0844  Gross per 24 hour  Intake    720 ml  Output      4 ml  Net    716 ml    General: Alert, awake, oriented x3, in no acute distress.  HEENT: [place]/AT [place], EOMI Neck: Trachea midline,  no masses, no thyromegal,y no JVD, no carotid bruit OROPHARYNX:  Moist, No exudate/ erythema/lesions.  Heart: Regular rate and rhythm, without murmurs, rubs, gallops, PMI non-displaced, no heaves or thrills on palpation.  Lungs: Clear to auscultation, no wheezing or rhonchi noted. No increased vocal fremitus resonant to percussion  Abdomen: Soft, nontender, nondistended, positive bowel sounds, no masses no hepatosplenomegaly noted..  Neuro: No focal neurological deficits noted cranial nerves II through XII grossly intact. DTRs 2+ bilaterally upper and lower extremities. Strength 5 out of 5 in bilateral upper and lower extremities. Musculoskeletal: No warm swelling or erythema around joints, no spinal tenderness noted. Psychiatric: Patient alert and oriented x3, good insight and cognition, good recent to remote recall. Lymph node survey: No cervical axillary or inguinal lymphadenopathy noted.     Lab Results:  Basename 03/30/11 0615 03/29/11 1745 03/29/11 1020 03/29/11 0750  NA 137 -- -- 135  K 3.6 2.7* -- --  CL 102 -- -- 97  CO2 21 --  -- 26  GLUCOSE 115* -- -- 156*  BUN 12 -- -- 10  CREATININE 1.39* -- 1.35 --  CALCIUM 9.8 -- -- 9.8  MG -- -- 1.7 --  PHOS -- -- -- --   No results found for this basename: AST:2,ALT:2,ALKPHOS:2,BILITOT:2,PROT:2,ALBUMIN:2 in the last 72 hours No results found for this basename: LIPASE:2,AMYLASE:2 in the last 72 hours  Basename 03/29/11 1020 03/28/11 2109  WBC 11.0* 11.3*  NEUTROABS -- --  HGB 14.5 13.7  HCT 41.1 38.9*  MCV 82.2 82.2  PLT 280 281   No results found for this basename: CKTOTAL:3,CKMB:3,CKMBINDEX:3,TROPONINI:3 in the last 72 hours No results found for this basename: POCBNP:3 in the last 72 hours No results found for this basename: DDIMER:2 in the last 72 hours  Basename 03/29/11 0900 03/27/11 2310  HGBA1C 5.6 5.8*   No results found for this basename: CHOL:2,HDL:2,LDLCALC:2,TRIG:2,CHOLHDL:2,LDLDIRECT:2 in the last 72 hours No results found for this basename: TSH,T4TOTAL,FREET3,T3FREE,THYROIDAB in the last 72 hours No results found for this basename: VITAMINB12:2,FOLATE:2,FERRITIN:2,TIBC:2,IRON:2,RETICCTPCT:2 in the last 72 hours  Micro Results: No results found for this or any previous visit (from the past 240 hour(s)).  Studies/Results: No results found.  Medications: I have reviewed the patient's current medications. Scheduled Meds:   . aspirin  325 mg Oral Daily  . colchicine  0.6 mg Oral Daily  . enoxaparin (LOVENOX) injection  65 mg Subcutaneous Q24H  . escitalopram  5 mg Oral Daily  .  folic acid  1 mg Oral Daily  . insulin aspart  0-15 Units Subcutaneous TID WC  . insulin aspart  0-5 Units Subcutaneous QHS  . insulin glargine  15 Units Subcutaneous QHS  . levothyroxine  200 mcg Oral QAC breakfast  . lisinopril  10 mg Oral Daily  . mirtazapine  15 mg Oral QHS  . polyethylene glycol  17 g Oral Daily  . potassium chloride  40 mEq Oral Q2H  . potassium chloride  40 mEq Oral BID  . sodium chloride  3 mL Intravenous Q12H  . sodium chloride  3 mL  Intravenous Q12H  . thiamine  100 mg Oral Daily  . DISCONTD: potassium chloride  40 mEq Oral Q2H   Continuous Infusions:   . sodium chloride    . sodium chloride     PRN Meds:.acetaminophen, acetaminophen, ondansetron (ZOFRAN) IV, ondansetron, oxyCODONE, senna, sodium chloride, sodium chloride Assessment/Plan: Patient Active Hospital Problem List: Hypokalemia (03/27/2011)   Assessment: Patient's potassium level is found within normal ranges after IV and by mouth supplementation. I believe that this patient's low potassium secondary to diuretics and likely to under supplementation of potassium.    Plan: Patient on scheduled dosing of potassium orally and recheck potassium in the morning. If the potassium and magnesium are within normal limits when the patient can likely be discharged home.  Depression (03/27/2011)   Assessment: The patient has been started on Lexapro and Remeron per Dr. Rogers Blocker. He will followup as an outpatient for intense outpatient therapy.   Diabetes mellitus (03/29/2011)   Assessment: Pressure well controlled. We'll resume the patient was Glucovance of discharge.    Hypothyroidism (03/29/2011)   Assessment:  Synthroid Anticipate discharge home tomorrow if potassium stays within normal ranges.   LOS: 2 days

## 2011-03-31 LAB — GLUCOSE, CAPILLARY
Glucose-Capillary: 109 mg/dL — ABNORMAL HIGH (ref 70–99)
Glucose-Capillary: 159 mg/dL — ABNORMAL HIGH (ref 70–99)

## 2011-03-31 LAB — BASIC METABOLIC PANEL
BUN: 20 mg/dL (ref 6–23)
CO2: 27 mEq/L (ref 19–32)
Calcium: 9.4 mg/dL (ref 8.4–10.5)
GFR calc non Af Amer: 49 mL/min — ABNORMAL LOW (ref >90)
Glucose, Bld: 125 mg/dL — ABNORMAL HIGH (ref 70–99)

## 2011-03-31 MED ORDER — POTASSIUM CHLORIDE 20 MEQ PO PACK: 40.0000 meq | PACK | Freq: Three times a day (TID) | ORAL | Status: DC

## 2011-03-31 MED ORDER — MIRTAZAPINE 15 MG PO TABS: 15.0000 mg | ORAL_TABLET | Freq: Every day | ORAL | Status: AC

## 2011-03-31 MED ORDER — FOLIC ACID 1 MG PO TABS: 1.0000 mg | ORAL_TABLET | Freq: Every day | ORAL | Status: DC

## 2011-03-31 MED ORDER — THIAMINE HCL 100 MG PO TABS: 100.0000 mg | ORAL_TABLET | Freq: Every day | ORAL | Status: DC

## 2011-03-31 MED ORDER — ESCITALOPRAM OXALATE 5 MG PO TABS: 5.0000 mg | ORAL_TABLET | Freq: Every day | ORAL | Status: DC

## 2011-03-31 NOTE — Plan of Care (Signed)
Problem: Consults Goal: Diagnosis-Diabetes Mellitus Outcome: Completed/Met Date Met:  03/31/11 New Onset Type II

## 2011-03-31 NOTE — Discharge Summary (Signed)
Phillip Turner MRN: [id number] DOB/AGE: March 22, 1961 50 y.o.  Admit date: 03/28/2011 Discharge date: 03/31/2011  Primary Care Physician:  Dorrene German, MD   Discharge Diagnoses:   Patient Active Problem List  Diagnoses  . Alcohol dependence  . Substance-induced disorder  . Cocaine abuse  . Hypokalemia  . Depression  . H/O: CVA (cardiovascular accident)  . Memory impairment  . Diabetes mellitus  . Hypothyroidism    DISCHARGE MEDICATION: Current Discharge Medication List    START taking these medications   Details  escitalopram (LEXAPRO) 5 MG tablet Take 1 tablet (5 mg total) by mouth daily. Qty: 30 tablet, Refills: 0    folic acid (FOLVITE) 1 MG tablet Take 1 tablet (1 mg total) by mouth daily. Qty: 30 tablet, Refills: 0    mirtazapine (REMERON) 15 MG tablet Take 1 tablet (15 mg total) by mouth at bedtime. Qty: 30 tablet, Refills: 0    potassium chloride (KLOR-CON) 20 MEQ packet Take 40 mEq by mouth 3 (three) times daily. Qty: 180 tablet, Refills: 0    thiamine 100 MG tablet Take 1 tablet (100 mg total) by mouth daily. Qty: 30 tablet, Refills: 0      CONTINUE these medications which have NOT CHANGED   Details  aspirin 325 MG tablet Take 325 mg by mouth daily.      colchicine 0.6 MG tablet Take 0.6 mg by mouth daily. Gout Flare    febuxostat (ULORIC) 40 MG tablet Take 40 mg by mouth daily.      glyBURIDE-metformin (GLUCOVANCE) 5-500 MG per tablet Take 1 tablet by mouth 3 (three) times daily.      levothyroxine (SYNTHROID, LEVOTHROID) 175 MCG tablet Take 175 mcg by mouth daily.      lisinopril (PRINIVIL,ZESTRIL) 10 MG tablet Take 10 mg by mouth daily.      simvastatin (ZOCOR) 20 MG tablet Take 20 mg by mouth daily.      tadalafil (CIALIS) 20 MG tablet Take 20 mg by mouth daily as needed.        STOP taking these medications     diltiazem (CARDIZEM CD) 360 MG 24 hr capsule      furosemide (LASIX) 20 MG tablet      metolazone (ZAROXOLYN) 5 MG tablet     potassium chloride (KLOR-CON) 10 MEQ CR tablet            Consults: Psychiatry  SIGNIFICANT DIAGNOSTIC STUDIES:  No results found.  No results found for this or any previous visit (from the past 240 hour(s)).  BRIEF ADMITTING H & P:  Mr. Criado is a very pleasant 50 year old gentleman with a history of CVA and hypertension who is on 2 diuretics. 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 Our during his stay at behavior health and was noted to have a low potassium. The patient was sent to the emergency room for potassium replacement, however despite several doses of potassium the patient's potassium is continued to stay below 3.0. We are asked to admit the patient for further management of his hypokalemia. The patient denies any chest pain he denies any palpitations, dizziness, nausea vomiting, fever chills, diarrhea. He states that he takes both Lasix and Zaroxolyn and potassium supplementation. He denies to be compliant with his potassium. The patient states that on his last visit with Dr. Concepcion Elk 3 months ago he was told that his potassium was low. The patient has a visit scheduled to see Dr. Concepcion Elk at the  end of this month.    Hospital Course:  Present on Admission:  .Hypokalemia: It was clear the patient's overall potassium stores were severely depleted. The patient required both IV and oral potassium. On today the patient's potassium is 3.3 with a regimen of 40 mEq of potassium twice a day. I'm increasing the patient's potassium to 40 mEq 3 times a day. The patient followed with Dr. Concepcion Elk in the clinic with a scheduled appointment on 04/05/2011 and at that time should have his electrolytes evaluated.   .Depression: The patient had been hospitalized in inpatient behavioral health for depression. He was seen in consultation with psychiatry who recommended outpatient care, and start the patient on Remeron and  Lexapro.  .Diabetes mellitus: The patient's blood sugars were managed with Lantus and NovoLog while hospitalized. The patient will resume his oral hypoglycemics post discharge.   .Hypothyroidism: The patient was adequately supplemented as indicated by normal TSH.  Disposition and Follow-up: The patient has a followup appointment already scheduled on 04/05/2011. Discharge Orders    Future Orders Please Complete By Expires   Diet Carb Modified         DISCHARGE EXAM: Blood pressure 120/61, pulse 57, temperature 97.9 F (36.6 C), temperature source Oral, resp. rate 18, height 5\' 11"  (1.803 m), weight 131.498 kg (289 lb 14.4 oz), SpO2 100.00%. General: Alert, awake, oriented x3, in no acute distress.  HEENT: [place]/AT PEERL, EOMI  Neck: Trachea midline, no masses, no thyromegal,y no JVD, no carotid bruit  OROPHARYNX: Moist, No exudate/ erythema/lesions.  Heart: Regular rate and rhythm, without murmurs, rubs, gallops, PMI non-displaced, no heaves or thrills on palpation.  Lungs: Clear to auscultation, no wheezing or rhonchi noted. No increased vocal fremitus resonant to percussion  Abdomen: Soft, nontender, nondistended, positive bowel sounds, no masses no hepatosplenomegaly noted..  Neuro: No focal neurological deficits noted cranial nerves II through XII grossly intact. DTRs 2+ bilaterally upper and lower extremities. Strength 5 out of 5 in bilateral upper and lower extremities.  Musculoskeletal: No warm swelling or erythema around joints, no spinal tenderness noted.  Psychiatric: Patient alert and oriented x3, good insight and cognition, good recent to remote recall.  Lymph node survey: No cervical axillary or inguinal lymphadenopathy noted.      Basename 03/31/11 0500 03/30/11 0615 03/29/11 1020  NA 140 137 --  K 3.3* 3.6 --  CL 105 102 --  CO2 27 21 --  GLUCOSE 125* 115* --  BUN 20 12 --  CREATININE 1.60* 1.39* --  CALCIUM 9.4 9.8 --  MG 2.0 -- 1.7  PHOS -- -- --   No results  found for this basename: AST:2,ALT:2,ALKPHOS:2,BILITOT:2,PROT:2,ALBUMIN:2 in the last 72 hours No results found for this basename: LIPASE:2,AMYLASE:2 in the last 72 hours  Basename 03/29/11 1020 03/28/11 2109  WBC 11.0* 11.3*  NEUTROABS -- --  HGB 14.5 13.7  HCT 41.1 38.9*  MCV 82.2 82.2  PLT 280 281    Signed: Mariavictoria Nottingham A. 03/31/2011, 3:11 PM

## 2011-03-31 NOTE — Progress Notes (Signed)
He is awake, alert and quite easily mobile.  He has no c/o pain.

## 2011-04-01 LAB — GLUCOSE, CAPILLARY: Glucose-Capillary: 136 mg/dL — ABNORMAL HIGH (ref 70–99)
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
## 2011-12-11 ENCOUNTER — Encounter (HOSPITAL_COMMUNITY): Payer: Self-pay | Admitting: Emergency Medicine

## 2011-12-11 ENCOUNTER — Emergency Department (HOSPITAL_COMMUNITY): Payer: No Typology Code available for payment source

## 2011-12-11 ENCOUNTER — Emergency Department (HOSPITAL_COMMUNITY)
Admission: EM | Admit: 2011-12-11 | Discharge: 2011-12-11 | Disposition: A | Discharge status: Home / Self Care | Payer: No Typology Code available for payment source | Attending: Emergency Medicine | Admitting: Emergency Medicine

## 2011-12-11 DIAGNOSIS — Z7982 Long term (current) use of aspirin: Secondary | ICD-10-CM | POA: Insufficient documentation

## 2011-12-11 DIAGNOSIS — F341 Dysthymic disorder: Secondary | ICD-10-CM | POA: Insufficient documentation

## 2011-12-11 DIAGNOSIS — J209 Acute bronchitis, unspecified: Secondary | ICD-10-CM | POA: Insufficient documentation

## 2011-12-11 DIAGNOSIS — E119 Type 2 diabetes mellitus without complications: Secondary | ICD-10-CM | POA: Insufficient documentation

## 2011-12-11 DIAGNOSIS — Z79899 Other long term (current) drug therapy: Secondary | ICD-10-CM | POA: Insufficient documentation

## 2011-12-11 DIAGNOSIS — Z8673 Personal history of transient ischemic attack (TIA), and cerebral infarction without residual deficits: Secondary | ICD-10-CM | POA: Insufficient documentation

## 2011-12-11 DIAGNOSIS — E039 Hypothyroidism, unspecified: Secondary | ICD-10-CM | POA: Insufficient documentation

## 2011-12-11 DIAGNOSIS — I10 Essential (primary) hypertension: Secondary | ICD-10-CM | POA: Insufficient documentation

## 2011-12-11 LAB — COMPREHENSIVE METABOLIC PANEL
ALT: 53 U/L (ref 0–53)
BUN: 11 mg/dL (ref 6–23)
CO2: 27 mEq/L (ref 19–32)
Calcium: 9.6 mg/dL (ref 8.4–10.5)
GFR calc Af Amer: 84 mL/min — ABNORMAL LOW (ref >90)
GFR calc non Af Amer: 73 mL/min — ABNORMAL LOW (ref >90)
Glucose, Bld: 276 mg/dL — ABNORMAL HIGH (ref 70–99)
Sodium: 137 mEq/L (ref 135–145)

## 2011-12-11 LAB — CBC WITH DIFFERENTIAL/PLATELET
Eosinophils Relative: 5 % (ref 0–5)
Lymphocytes Relative: 27 % (ref 12–46)
Lymphs Abs: 2.6 10*3/uL (ref 0.7–4.0)
MCH: 26.9 pg (ref 26.0–34.0)
MCHC: 34.7 g/dL (ref 30.0–36.0)
Monocytes Absolute: 1 10*3/uL (ref 0.1–1.0)
Monocytes Relative: 10 % (ref 3–12)
Platelets: 288 10*3/uL (ref 150–400)

## 2011-12-11 LAB — URINALYSIS, ROUTINE W REFLEX MICROSCOPIC
Bilirubin Urine: NEGATIVE
Ketones, ur: NEGATIVE mg/dL
Nitrite: NEGATIVE
Specific Gravity, Urine: 1.018 (ref 1.005–1.030)
Urobilinogen, UA: 0.2 mg/dL (ref 0.0–1.0)

## 2011-12-11 LAB — POCT I-STAT TROPONIN I

## 2011-12-11 MED ORDER — PREDNISONE 10 MG PO TABS: 20.0000 mg | ORAL_TABLET | Freq: Every day | ORAL | Status: DC

## 2011-12-11 MED ORDER — BENZONATATE 100 MG PO CAPS: 100.0000 mg | ORAL_CAPSULE | Freq: Three times a day (TID) | ORAL | Status: AC

## 2011-12-11 MED ORDER — IPRATROPIUM BROMIDE 0.02 % IN SOLN
0.5000 mg | Freq: Once | RESPIRATORY_TRACT | Status: AC
  Administered 2011-12-11: 0.5 mg via RESPIRATORY_TRACT
  Filled 2011-12-11: qty 2.5

## 2011-12-11 MED ORDER — ALBUTEROL SULFATE (2.5 MG/3ML) 0.083% IN NEBU: 2.5000 mg | INHALATION_SOLUTION | Freq: Four times a day (QID) | RESPIRATORY_TRACT | Status: DC | PRN

## 2011-12-11 MED ORDER — METHYLPREDNISOLONE SODIUM SUCC 125 MG IJ SOLR
125.0000 mg | Freq: Once | INTRAMUSCULAR | Status: AC
  Administered 2011-12-11: 125 mg via INTRAVENOUS
  Filled 2011-12-11: qty 2

## 2011-12-11 MED ORDER — ALBUTEROL SULFATE (5 MG/ML) 0.5% IN NEBU
5.0000 mg | INHALATION_SOLUTION | Freq: Once | RESPIRATORY_TRACT | Status: AC
  Administered 2011-12-11: 5 mg via RESPIRATORY_TRACT
  Filled 2011-12-11: qty 1

## 2011-12-11 MED ORDER — AZITHROMYCIN 250 MG PO TABS: 250.0000 mg | ORAL_TABLET | Freq: Every day | ORAL | Status: AC

## 2011-12-11 MED ORDER — ALBUTEROL SULFATE (5 MG/ML) 0.5% IN NEBU
5.0000 mg | INHALATION_SOLUTION | Freq: Once | RESPIRATORY_TRACT | Status: AC
  Administered 2011-12-11: 5 mg via RESPIRATORY_TRACT
  Filled 2011-12-11: qty 0.5

## 2011-12-11 NOTE — ED Provider Notes (Signed)
History     CSN: [id number]  Arrival date & time 12/11/11  1426   First MD Initiated Contact with Patient 12/11/11 1507      Chief Complaint  Patient presents with  . Shortness of Breath    (Consider location/radiation/quality/duration/timing/severity/associated sxs/prior treatment) HPI Pt has had several days of cough, SOB, and wheezing. Both his wife and daughter have been diagnosed with Pneumonia. No fever, chills, CP. Has been using wife home neb without relief. He is concerned that mold in the house is causing his family's symptoms.  Past Medical History  Diagnosis Date  . Diabetes mellitus   . Hypertension   . Stroke   . Hypothyroidism   . Anxiety   . Depression     Past Surgical History  Procedure Date  . Tonsillectomy     Family History  Problem Relation Age of Onset  . Coronary artery disease Mother   . Diabetes type II Father   . Hypertension Father     History  Substance Use Topics  . Smoking status: Former Games developer  . Smokeless tobacco: Never Used  . Alcohol Use: 16.8 oz/week    28 Cans of beer per week     Pt consumes beers occasionally      Review of Systems  Constitutional: Negative for fever and chills.  HENT: Negative for neck pain.   Respiratory: Positive for cough, shortness of breath and wheezing.   Cardiovascular: Negative for chest pain, palpitations and leg swelling.  Gastrointestinal: Negative for nausea, vomiting and abdominal pain.  Skin: Negative for rash and wound.  Neurological: Negative for dizziness, weakness, numbness and headaches.    Allergies  Shellfish allergy  Home Medications   Current Outpatient Rx  Name Route Sig Dispense Refill  . ASPIRIN 325 MG PO TABS Oral Take 325 mg by mouth daily.      . COLCHICINE 0.6 MG PO TABS Oral Take 0.6 mg by mouth daily. Gout Flare    . ESCITALOPRAM OXALATE 5 MG PO TABS Oral Take 5 mg by mouth daily.    . FEBUXOSTAT 40 MG PO TABS Oral Take 40 mg by mouth daily.      . FUROSEMIDE  20 MG PO TABS Oral Take 20 mg by mouth 2 (two) times daily.    Marland Kitchen LEVOTHYROXINE SODIUM 200 MCG PO TABS Oral Take 200 mcg by mouth daily.    Marland Kitchen LISINOPRIL 10 MG PO TABS Oral Take 10 mg by mouth daily.      Marland Kitchen METFORMIN HCL 500 MG PO TABS Oral Take 1,000 mg by mouth daily with breakfast.    . POTASSIUM CHLORIDE 20 MEQ PO PACK Oral Take 40 mEq by mouth 2 (two) times daily.    Marland Kitchen SIMVASTATIN 20 MG PO TABS Oral Take 20 mg by mouth daily.      . ALBUTEROL SULFATE (2.5 MG/3ML) 0.083% IN NEBU Nebulization Take 3 mLs (2.5 mg total) by nebulization every 6 (six) hours as needed for wheezing. 75 mL 12  . AZITHROMYCIN 250 MG PO TABS Oral Take 1 tablet (250 mg total) by mouth daily. Take first 2 tablets together, then 1 every day until finished. 6 tablet 0  . BENZONATATE 100 MG PO CAPS Oral Take 1 capsule (100 mg total) by mouth every 8 (eight) hours. 21 capsule 0  . ESCITALOPRAM OXALATE 5 MG PO TABS Oral Take 1 tablet (5 mg total) by mouth daily. 30 tablet 0  . PREDNISONE 10 MG PO TABS Oral Take 2 tablets (20  mg total) by mouth daily. 10 tablet 0    BP 150/100  Pulse 86  Temp 97.8 F (36.6 C) (Oral)  Resp 24  SpO2 92%  Physical Exam  Nursing note and vitals reviewed. Constitutional: He is oriented to person, place, and time. He appears well-developed and well-nourished. No distress.  HENT:  Head: Normocephalic and atraumatic.  Mouth/Throat: Oropharynx is clear and moist.  Eyes: EOM are normal. Pupils are equal, round, and reactive to light.  Neck: Normal range of motion. Neck supple.  Cardiovascular: Normal rate and regular rhythm.   Pulmonary/Chest: Effort normal. No respiratory distress. He has wheezes. He has no rales.       Exp wheezing L bases. Insp and Exp wheezing R lung.  Abdominal: Soft. Bowel sounds are normal.  Musculoskeletal: Normal range of motion. He exhibits no edema and no tenderness.       No lower ext swelling or pain  Neurological: He is alert and oriented to person, place, and  time.  Skin: Skin is warm and dry. No rash noted. No erythema.  Psychiatric: He has a normal mood and affect. His behavior is normal.    ED Course  Procedures (including critical care time)  Labs Reviewed  CBC WITH DIFFERENTIAL - Abnormal; Notable for the following:    HCT 38.6 (*)     MCV 77.5 (*)     RDW 16.0 (*)     All other components within normal limits  COMPREHENSIVE METABOLIC PANEL - Abnormal; Notable for the following:    Potassium 3.2 (*)     Glucose, Bld 276 (*)     AST 60 (*)     GFR calc non Af Amer 73 (*)     GFR calc Af Amer 84 (*)     All other components within normal limits  URINALYSIS, ROUTINE W REFLEX MICROSCOPIC - Abnormal; Notable for the following:    Glucose, UA >1000 (*)     All other components within normal limits  URINE MICROSCOPIC-ADD ON - Abnormal; Notable for the following:    Bacteria, UA FEW (*)     All other components within normal limits  POCT I-STAT TROPONIN I   Dg Chest 2 View  12/11/2011  *RADIOLOGY REPORT*  Clinical Data: Wheezing, cough, shortness of breath  CHEST - 2 VIEW  Comparison: None.  Findings: Cardiomediastinal silhouette is unremarkable.  No acute infiltrate or pleural effusion.  No pulmonary edema.  Mild degenerative changes mid thoracic spine.  IMPRESSION: No active disease.  Mild degenerative changes mid thoracic spine.  Original Report Authenticated By: Natasha Mead, M.D.     1. Bronchitis with bronchospasm      Date: 12/11/2011  Rate: 74  Rhythm: normal sinus rhythm  QRS Axis: normal  Intervals: normal  ST/T Wave abnormalities: normal  Conduction Disutrbances:none  Narrative Interpretation:   Old EKG Reviewed: unchanged    MDM   Pt states he is feeling much better after nebs. Lungs sounds improved. No just mild scattered wheezing present. Will give one more treatment and likely d/c home.       Loren Racer, MD 12/11/11 2253

## 2011-12-11 NOTE — ED Notes (Signed)
Explained to pt the need for urine sample and place urinal for pt so that he would have access when he felt the need to urinate.

## 2011-12-11 NOTE — ED Notes (Addendum)
Pt presenting to ed with c/o shortness of breath and cough. Pt state he has mold in his house and his whole family is sick pt states no dizziness. Pt denies chest pain at this time. Pt is alert and oriented

## 2012-03-08 ENCOUNTER — Observation Stay (HOSPITAL_COMMUNITY)
Admission: EM | Admit: 2012-03-08 | Discharge: 2012-03-09 | Payer: No Typology Code available for payment source | Attending: Internal Medicine | Admitting: Internal Medicine

## 2012-03-08 ENCOUNTER — Encounter (HOSPITAL_COMMUNITY): Payer: Self-pay | Admitting: *Deleted

## 2012-03-08 DIAGNOSIS — F1999 Other psychoactive substance use, unspecified with unspecified psychoactive substance-induced disorder: Secondary | ICD-10-CM

## 2012-03-08 DIAGNOSIS — Z79899 Other long term (current) drug therapy: Secondary | ICD-10-CM | POA: Insufficient documentation

## 2012-03-08 DIAGNOSIS — E871 Hypo-osmolality and hyponatremia: Secondary | ICD-10-CM | POA: Diagnosis present

## 2012-03-08 DIAGNOSIS — E039 Hypothyroidism, unspecified: Secondary | ICD-10-CM | POA: Diagnosis present

## 2012-03-08 DIAGNOSIS — D72829 Elevated white blood cell count, unspecified: Secondary | ICD-10-CM | POA: Diagnosis present

## 2012-03-08 DIAGNOSIS — F32A Depression, unspecified: Secondary | ICD-10-CM | POA: Diagnosis present

## 2012-03-08 DIAGNOSIS — F3289 Other specified depressive episodes: Secondary | ICD-10-CM | POA: Insufficient documentation

## 2012-03-08 DIAGNOSIS — Z7982 Long term (current) use of aspirin: Secondary | ICD-10-CM | POA: Insufficient documentation

## 2012-03-08 DIAGNOSIS — E876 Hypokalemia: Principal | ICD-10-CM | POA: Diagnosis present

## 2012-03-08 DIAGNOSIS — R609 Edema, unspecified: Secondary | ICD-10-CM | POA: Insufficient documentation

## 2012-03-08 DIAGNOSIS — E119 Type 2 diabetes mellitus without complications: Secondary | ICD-10-CM | POA: Diagnosis present

## 2012-03-08 DIAGNOSIS — F1410 Cocaine abuse, uncomplicated: Secondary | ICD-10-CM | POA: Diagnosis present

## 2012-03-08 DIAGNOSIS — R413 Other amnesia: Secondary | ICD-10-CM

## 2012-03-08 DIAGNOSIS — E118 Type 2 diabetes mellitus with unspecified complications: Secondary | ICD-10-CM | POA: Diagnosis present

## 2012-03-08 DIAGNOSIS — F329 Major depressive disorder, single episode, unspecified: Secondary | ICD-10-CM | POA: Insufficient documentation

## 2012-03-08 DIAGNOSIS — Z8673 Personal history of transient ischemic attack (TIA), and cerebral infarction without residual deficits: Secondary | ICD-10-CM

## 2012-03-08 DIAGNOSIS — R45851 Suicidal ideations: Secondary | ICD-10-CM | POA: Insufficient documentation

## 2012-03-08 DIAGNOSIS — F1020 Alcohol dependence, uncomplicated: Secondary | ICD-10-CM | POA: Diagnosis present

## 2012-03-08 DIAGNOSIS — E1165 Type 2 diabetes mellitus with hyperglycemia: Secondary | ICD-10-CM

## 2012-03-08 DIAGNOSIS — IMO0001 Reserved for inherently not codable concepts without codable children: Secondary | ICD-10-CM | POA: Insufficient documentation

## 2012-03-08 DIAGNOSIS — Z23 Encounter for immunization: Secondary | ICD-10-CM | POA: Insufficient documentation

## 2012-03-08 LAB — HEMOGLOBIN A1C
Hgb A1c MFr Bld: 8 % — ABNORMAL HIGH (ref <5.7)
Mean Plasma Glucose: 183 mg/dL — ABNORMAL HIGH (ref <117)

## 2012-03-08 LAB — CBC
Hemoglobin: 14.9 g/dL (ref 13.0–17.0)
MCH: 27.2 pg (ref 26.0–34.0)
MCV: 74 fL — ABNORMAL LOW (ref 78.0–100.0)
RBC: 5.47 MIL/uL (ref 4.22–5.81)

## 2012-03-08 LAB — COMPREHENSIVE METABOLIC PANEL
BUN: 13 mg/dL (ref 6–23)
CO2: 23 mEq/L (ref 19–32)
Calcium: 9.7 mg/dL (ref 8.4–10.5)
Creatinine, Ser: 1.32 mg/dL (ref 0.50–1.35)
GFR calc Af Amer: 71 mL/min — ABNORMAL LOW (ref >90)
GFR calc non Af Amer: 61 mL/min — ABNORMAL LOW (ref >90)
Glucose, Bld: 147 mg/dL — ABNORMAL HIGH (ref 70–99)

## 2012-03-08 LAB — RAPID URINE DRUG SCREEN, HOSP PERFORMED
Cocaine: POSITIVE — AB
Opiates: NOT DETECTED

## 2012-03-08 LAB — BASIC METABOLIC PANEL
Calcium: 9 mg/dL (ref 8.4–10.5)
GFR calc non Af Amer: 50 mL/min — ABNORMAL LOW (ref >90)
Sodium: 133 mEq/L — ABNORMAL LOW (ref 135–145)

## 2012-03-08 LAB — GLUCOSE, CAPILLARY: Glucose-Capillary: 185 mg/dL — ABNORMAL HIGH (ref 70–99)

## 2012-03-08 LAB — MAGNESIUM: Magnesium: 2.3 mg/dL (ref 1.5–2.5)

## 2012-03-08 MED ORDER — ESCITALOPRAM OXALATE 5 MG PO TABS
5.0000 mg | ORAL_TABLET | Freq: Every day | ORAL | Status: DC
  Administered 2012-03-08 – 2012-03-09 (×2): 5 mg via ORAL
  Filled 2012-03-08 (×3): qty 1

## 2012-03-08 MED ORDER — INSULIN ASPART 100 UNIT/ML ~~LOC~~ SOLN
0.0000 [IU] | Freq: Three times a day (TID) | SUBCUTANEOUS | Status: DC
  Administered 2012-03-08 (×2): 2 [IU] via SUBCUTANEOUS
  Administered 2012-03-09: 3 [IU] via SUBCUTANEOUS
  Administered 2012-03-09: 2 [IU] via SUBCUTANEOUS

## 2012-03-08 MED ORDER — FEBUXOSTAT 40 MG PO TABS
40.0000 mg | ORAL_TABLET | Freq: Every day | ORAL | Status: DC
  Administered 2012-03-08 – 2012-03-09 (×2): 40 mg via ORAL
  Filled 2012-03-08 (×2): qty 1

## 2012-03-08 MED ORDER — SODIUM CHLORIDE 0.9 % IV SOLN
INTRAVENOUS | Status: DC
  Administered 2012-03-08: 1000 mL via INTRAVENOUS

## 2012-03-08 MED ORDER — ASPIRIN 325 MG PO TABS
325.0000 mg | ORAL_TABLET | Freq: Every day | ORAL | Status: DC
  Administered 2012-03-08 – 2012-03-09 (×2): 325 mg via ORAL
  Filled 2012-03-08 (×2): qty 1

## 2012-03-08 MED ORDER — FUROSEMIDE 20 MG PO TABS
20.0000 mg | ORAL_TABLET | ORAL | Status: DC
  Administered 2012-03-09: 20 mg via ORAL
  Filled 2012-03-08: qty 1

## 2012-03-08 MED ORDER — POTASSIUM CHLORIDE 10 MEQ/100ML IV SOLN
10.0000 meq | INTRAVENOUS | Status: AC
  Administered 2012-03-08 (×3): 10 meq via INTRAVENOUS
  Filled 2012-03-08 (×4): qty 100

## 2012-03-08 MED ORDER — ACETAMINOPHEN 650 MG RE SUPP: 650.0000 mg | Freq: Four times a day (QID) | RECTAL | Status: DC | PRN

## 2012-03-08 MED ORDER — FOLIC ACID 1 MG PO TABS
1.0000 mg | ORAL_TABLET | Freq: Every day | ORAL | Status: DC
  Administered 2012-03-08 – 2012-03-09 (×2): 1 mg via ORAL
  Filled 2012-03-08 (×2): qty 1

## 2012-03-08 MED ORDER — GLYBURIDE 2.5 MG PO TABS
2.5000 mg | ORAL_TABLET | Freq: Two times a day (BID) | ORAL | Status: DC
  Administered 2012-03-08 – 2012-03-09 (×3): 2.5 mg via ORAL
  Filled 2012-03-08 (×4): qty 1

## 2012-03-08 MED ORDER — INFLUENZA VIRUS VACC SPLIT PF IM SUSP
0.5000 mL | INTRAMUSCULAR | Status: DC
  Administered 2012-03-09: 0.5 mL via INTRAMUSCULAR
  Filled 2012-03-08: qty 0.5

## 2012-03-08 MED ORDER — ONDANSETRON HCL 4 MG PO TABS: 4.0000 mg | ORAL_TABLET | Freq: Four times a day (QID) | ORAL | Status: DC | PRN

## 2012-03-08 MED ORDER — LEVOTHYROXINE SODIUM 200 MCG PO TABS
200.0000 ug | ORAL_TABLET | Freq: Every day | ORAL | Status: DC
  Filled 2012-03-08: qty 1

## 2012-03-08 MED ORDER — POTASSIUM CHLORIDE 20 MEQ PO PACK
40.0000 meq | PACK | Freq: Two times a day (BID) | ORAL | Status: DC
Start: 2012-03-08 — End: 2012-03-08
  Filled 2012-03-08 (×2): qty 2

## 2012-03-08 MED ORDER — THIAMINE HCL 100 MG/ML IJ SOLN
100.0000 mg | Freq: Every day | INTRAMUSCULAR | Status: DC
  Filled 2012-03-08 (×2): qty 1

## 2012-03-08 MED ORDER — POTASSIUM CHLORIDE CRYS ER 20 MEQ PO TBCR
40.0000 meq | EXTENDED_RELEASE_TABLET | Freq: Two times a day (BID) | ORAL | Status: DC
  Administered 2012-03-08 – 2012-03-09 (×2): 40 meq via ORAL
  Filled 2012-03-08 (×3): qty 2

## 2012-03-08 MED ORDER — POTASSIUM CHLORIDE 10 MEQ/100ML IV SOLN
10.0000 meq | INTRAVENOUS | Status: AC
  Administered 2012-03-08 (×4): 10 meq via INTRAVENOUS
  Filled 2012-03-08 (×4): qty 100

## 2012-03-08 MED ORDER — ENOXAPARIN SODIUM 40 MG/0.4ML ~~LOC~~ SOLN
40.0000 mg | SUBCUTANEOUS | Status: DC
  Administered 2012-03-08: 40 mg via SUBCUTANEOUS
  Filled 2012-03-08 (×2): qty 0.4

## 2012-03-08 MED ORDER — METFORMIN HCL 500 MG PO TABS
1000.0000 mg | ORAL_TABLET | Freq: Every day | ORAL | Status: DC
  Administered 2012-03-08 – 2012-03-09 (×2): 1000 mg via ORAL
  Filled 2012-03-08 (×3): qty 2

## 2012-03-08 MED ORDER — POLYETHYLENE GLYCOL 3350 17 G PO PACK
17.0000 g | PACK | Freq: Every day | ORAL | Status: DC | PRN
  Filled 2012-03-08: qty 1

## 2012-03-08 MED ORDER — VITAMIN B-1 100 MG PO TABS
100.0000 mg | ORAL_TABLET | Freq: Every day | ORAL | Status: DC
  Administered 2012-03-08 – 2012-03-09 (×2): 100 mg via ORAL
  Filled 2012-03-08 (×2): qty 1

## 2012-03-08 MED ORDER — LORAZEPAM 1 MG PO TABS
1.0000 mg | ORAL_TABLET | Freq: Four times a day (QID) | ORAL | Status: DC | PRN
  Administered 2012-03-08 – 2012-03-09 (×2): 1 mg via ORAL
  Filled 2012-03-08 (×2): qty 1

## 2012-03-08 MED ORDER — ACETAMINOPHEN 325 MG PO TABS: 650.0000 mg | ORAL_TABLET | Freq: Four times a day (QID) | ORAL | Status: DC | PRN

## 2012-03-08 MED ORDER — ALBUTEROL SULFATE (5 MG/ML) 0.5% IN NEBU: 2.5000 mg | INHALATION_SOLUTION | Freq: Four times a day (QID) | RESPIRATORY_TRACT | Status: DC | PRN

## 2012-03-08 MED ORDER — POTASSIUM CHLORIDE CRYS ER 20 MEQ PO TBCR
40.0000 meq | EXTENDED_RELEASE_TABLET | Freq: Once | ORAL | Status: AC
  Administered 2012-03-08: 40 meq via ORAL
  Filled 2012-03-08: qty 2

## 2012-03-08 MED ORDER — ONDANSETRON HCL 4 MG/2ML IJ SOLN: 4.0000 mg | Freq: Four times a day (QID) | INTRAMUSCULAR | Status: DC | PRN

## 2012-03-08 MED ORDER — LISINOPRIL 10 MG PO TABS
10.0000 mg | ORAL_TABLET | Freq: Every day | ORAL | Status: DC
  Administered 2012-03-08 – 2012-03-09 (×2): 10 mg via ORAL
  Filled 2012-03-08 (×2): qty 1

## 2012-03-08 MED ORDER — LORAZEPAM 2 MG/ML IJ SOLN: 1.0000 mg | Freq: Four times a day (QID) | INTRAMUSCULAR | Status: DC | PRN

## 2012-03-08 MED ORDER — SIMVASTATIN 20 MG PO TABS
20.0000 mg | ORAL_TABLET | Freq: Every day | ORAL | Status: DC
  Administered 2012-03-08 – 2012-03-09 (×2): 20 mg via ORAL
  Filled 2012-03-08 (×2): qty 1

## 2012-03-08 MED ORDER — LEVOTHYROXINE SODIUM 200 MCG PO TABS
200.0000 ug | ORAL_TABLET | Freq: Every day | ORAL | Status: DC
  Administered 2012-03-08 – 2012-03-09 (×2): 200 ug via ORAL
  Filled 2012-03-08 (×4): qty 1

## 2012-03-08 MED ORDER — METOLAZONE 5 MG PO TABS
5.0000 mg | ORAL_TABLET | ORAL | Status: DC
  Administered 2012-03-08: 5 mg via ORAL
  Filled 2012-03-08: qty 1

## 2012-03-08 MED ORDER — COLCHICINE 0.6 MG PO TABS
0.6000 mg | ORAL_TABLET | Freq: Every day | ORAL | Status: DC
  Administered 2012-03-08 – 2012-03-09 (×2): 0.6 mg via ORAL
  Filled 2012-03-08 (×2): qty 1

## 2012-03-08 MED ORDER — ADULT MULTIVITAMIN W/MINERALS CH
1.0000 | ORAL_TABLET | Freq: Every day | ORAL | Status: DC
  Administered 2012-03-08 – 2012-03-09 (×2): 1 via ORAL
  Filled 2012-03-08 (×2): qty 1

## 2012-03-08 NOTE — Consult Note (Addendum)
Reason for Consult: drug abuse Referring Physician: unknown  Phillip Turner is an 51 y.o. male.  HPI: Phillip Turner is a 51 y.o. African American male with history of hypertension, diabetes, hypothyroidism, anxiety, depression, history of CVA, alcohol use, and cocaine use who was admitted today.  Patient reports that he has had feelings of depression and has been having thoughts about hurting himself. He reports that he drinks one or 2 cans of beer daily mainly to help with depression and stress. He occasionally drinks brandy with his beers. He also uses cocaine perhaps once or twice a month and last use yesterday.   He presented to the ER wanting to be detoxed. In the emergency department he was found to be hypokalemic and the hospitalist service was asked to admit the patient for further care and management. Patient has chronic lower extremity edema and is on alternating regimen of furosemide and metolazone. Patient reports that he forgets taking potassium with his diuretics. Has not had any recent fevers, chills, nausea, vomiting, chest pain, shortness of breath, abdominal pain, diarrhea, or headaches. He does report intermittent vision changes, currently denies any changes in his vision and has had ophthalmology exam this year.   Was seen today. Interested for detox now and rehab after that. Denies any SI today. Reports feeling stressed out due to family stressors as his wife is sick for the last 2 years. Currently disabled. Reports his etoh and caoine makes him more depressed. Never have drug rehab in past. Thinks he is also interested to try meds for his depression now. Reports his current stressors make him depressed these days.   Past Medical History  Diagnosis Date  . Diabetes mellitus   . Hypertension   . Stroke   . Hypothyroidism   . Anxiety   . Depression     Past Surgical History  Procedure Date  . Tonsillectomy     Family History  Problem Relation Age of Onset  .  Coronary artery disease Mother   . Diabetes type II Father   . Hypertension Father     Social History:  reports that he has quit smoking. He has never used smokeless tobacco. He reports that he drinks about 16.8 ounces of alcohol per week. He reports that he does not use illicit drugs.  Allergies:  Allergies  Allergen Reactions  . Shellfish Allergy Anaphylaxis and Nausea And Vomiting    Medications: I have reviewed the patient's current medications.  Results for orders placed during the hospital encounter of 03/08/12 (from the past 48 hour(s))  CBC     Status: Abnormal   Collection Time   03/08/12  7:30 AM      Component Value Range Comment   WBC 12.9 (*) 4.0 - 10.5 K/uL    RBC 5.47  4.22 - 5.81 MIL/uL    Hemoglobin 14.9  13.0 - 17.0 g/dL    HCT 16.1  09.6 - 04.5 %    MCV 74.0 (*) 78.0 - 100.0 fL    MCH 27.2  26.0 - 34.0 pg    MCHC 36.8 (*) 30.0 - 36.0 g/dL    RDW 40.9 (*) 81.1 - 15.5 %    Platelets 345  150 - 400 K/uL   COMPREHENSIVE METABOLIC PANEL     Status: Abnormal   Collection Time   03/08/12  7:30 AM      Component Value Range Comment   Sodium 130 (*) 135 - 145 mEq/L    Potassium 2.4 (*) 3.5 -  5.1 mEq/L    Chloride 88 (*) 96 - 112 mEq/L    CO2 23  19 - 32 mEq/L    Glucose, Bld 147 (*) 70 - 99 mg/dL    BUN 13  6 - 23 mg/dL    Creatinine, Ser 4.09  0.50 - 1.35 mg/dL    Calcium 9.7  8.4 - 81.1 mg/dL    Total Protein 7.7  6.0 - 8.3 g/dL    Albumin 4.1  3.5 - 5.2 g/dL    AST 51 (*) 0 - 37 U/L    ALT 71 (*) 0 - 53 U/L    Alkaline Phosphatase 87  39 - 117 U/L    Total Bilirubin 1.0  0.3 - 1.2 mg/dL    GFR calc non Af Amer 61 (*) >90 mL/min    GFR calc Af Amer 71 (*) >90 mL/min   ETHANOL     Status: Normal   Collection Time   03/08/12  7:30 AM      Component Value Range Comment   Alcohol, Ethyl (B) <11  0 - 11 mg/dL   MAGNESIUM     Status: Normal   Collection Time   03/08/12  7:30 AM      Component Value Range Comment   Magnesium 2.3  1.5 - 2.5 mg/dL   GLUCOSE,  CAPILLARY     Status: Abnormal   Collection Time   03/08/12 11:00 AM      Component Value Range Comment   Glucose-Capillary 185 (*) 70 - 99 mg/dL   URINE RAPID DRUG SCREEN (HOSP PERFORMED)     Status: Abnormal   Collection Time   03/08/12 12:52 PM      Component Value Range Comment   Opiates NONE DETECTED  NONE DETECTED    Cocaine POSITIVE (*) NONE DETECTED    Benzodiazepines NONE DETECTED  NONE DETECTED    Amphetamines NONE DETECTED  NONE DETECTED    Tetrahydrocannabinol NONE DETECTED  NONE DETECTED    Barbiturates NONE DETECTED  NONE DETECTED     No results found.  ROS Blood pressure 141/77, pulse 88, temperature 98.4 F (36.9 C), temperature source Oral, resp. rate 20, height 5\' 10"  (1.778 m), weight 138.347 kg (305 lb), SpO2 91.00%. Physical Exam   Mental Status Examination/Evaluation:  Appearance: on bed  Eye Contact:: Good  Speech: normal  Volume: Normal  Mood: depressed  Affect: ristricted  Thought Process: organized  Orientation: Full  Thought Content: NO AVH  Suicidal Thoughts: No  Homicidal Thoughts: no  Memory: fair  Judgement: Impaired  Insight: Lacking  Psychomotor Activity: Normal  Concentration: Fair  Recall: Fair  Akathisia: No   Assessment:   AXIS I: Alcohol use, cocaine use, r/o substance induced mood d/o  AXIS II: Deferred  AXIS III: see mdical hx ? ?  ? ?  ?  ? ?  ?  ? ?  ?  ? ?  ?  ?  AXIS IV: disbaled  AXIS V: 50  ?  Treatment Plan/Recommendations:  1. will recommend drug rehab after detox. Psy SW can help with that  2. Consider increasing Lexapro to 10 mg QAM for depressive symptoms  3. TSH and t4   4. Will sign off now. Thanks for consult    Wonda Cerise 03/08/2012, 3:09 PM

## 2012-03-08 NOTE — ED Notes (Signed)
Called report to [employer]. Per charge nurse 5E does not do tele-psych on the floor pt will see psychiatrist on 5E per charge Rn. Telepsych paperwork does not need to be faxed per [facility] charge rn. [facility] charge Rn notified and aware. Pt awaiting transport to floor.

## 2012-03-08 NOTE — ED Notes (Addendum)
Critical potassium called to dr Denton Lank

## 2012-03-08 NOTE — Plan of Care (Signed)
Problem: Phase I Progression Outcomes Goal: Pain controlled with appropriate interventions Outcome: Not Applicable Date Met:  03/08/12 No pain

## 2012-03-08 NOTE — ED Provider Notes (Addendum)
History     CSN: [id number]  Arrival date & time 03/08/12  4098   First MD Initiated Contact with Patient 03/08/12 [phone]      Chief Complaint  Patient presents with  . Depression  . Suicidal    (Consider location/radiation/quality/duration/timing/severity/associated sxs/prior treatment) The history is provided by the patient.  pt states hx depression and substance abuse, presents stating feeling more depressed in past 1-2 weeks. Constant. Denies specific exacerbating or alleviating factors. States not compliant w meds. Has had intermittent thoughts of self harm, denies any specific plan or attempt. Denies thoughts of harm to others. Episodic etoh and cocaine abuse, denies daily use. States health otherwise at baseline, denies any recent physical illness or symptoms. States is eating and drinking normally. No wt change.     Past Medical History  Diagnosis Date  . Diabetes mellitus   . Hypertension   . Stroke   . Hypothyroidism   . Anxiety   . Depression     Past Surgical History  Procedure Date  . Tonsillectomy     Family History  Problem Relation Age of Onset  . Coronary artery disease Mother   . Diabetes type II Father   . Hypertension Father     History  Substance Use Topics  . Smoking status: Former Games developer  . Smokeless tobacco: Never Used  . Alcohol Use: 16.8 oz/week    28 Cans of beer per week     Comment: Pt consumes beers occasionally      Review of Systems  Constitutional: Negative for fever.  HENT: Negative for neck pain.   Eyes: Negative for redness.  Respiratory: Negative for shortness of breath.   Cardiovascular: Negative for chest pain.  Gastrointestinal: Negative for abdominal pain.  Genitourinary: Negative for flank pain.  Musculoskeletal: Negative for back pain.  Skin: Negative for rash.  Neurological: Negative for headaches.  Hematological: Does not bruise/bleed easily.  Psychiatric/Behavioral: Positive for dysphoric mood.     Allergies  Shellfish allergy  Home Medications   Current Outpatient Rx  Name  Route  Sig  Dispense  Refill  . ALBUTEROL SULFATE (2.5 MG/3ML) 0.083% IN NEBU   Nebulization   Take 3 mLs (2.5 mg total) by nebulization every 6 (six) hours as needed for wheezing.   75 mL   12   . ASPIRIN 325 MG PO TABS   Oral   Take 325 mg by mouth daily.           . COLCHICINE 0.6 MG PO TABS   Oral   Take 0.6 mg by mouth daily. Gout Flare         . ESCITALOPRAM OXALATE 5 MG PO TABS   Oral   Take 5 mg by mouth daily.         . FEBUXOSTAT 40 MG PO TABS   Oral   Take 40 mg by mouth daily.           . FUROSEMIDE 20 MG PO TABS   Oral   Take 20 mg by mouth 2 (two) times daily.         Marland Kitchen LEVOTHYROXINE SODIUM 200 MCG PO TABS   Oral   Take 200 mcg by mouth daily.         Marland Kitchen LISINOPRIL 10 MG PO TABS   Oral   Take 10 mg by mouth daily.           Marland Kitchen METFORMIN HCL 500 MG PO TABS   Oral   Take  1,000 mg by mouth daily with breakfast.         . POTASSIUM CHLORIDE 20 MEQ PO PACK   Oral   Take 40 mEq by mouth 2 (two) times daily.         Marland Kitchen SIMVASTATIN 20 MG PO TABS   Oral   Take 20 mg by mouth daily.             BP 135/64  Pulse 86  Temp 97.9 F (36.6 C) (Oral)  Resp 18  SpO2 95%  Physical Exam  Nursing note and vitals reviewed. Constitutional: He is oriented to person, place, and time. He appears well-developed and well-nourished. No distress.  HENT:  Head: Atraumatic.  Mouth/Throat: Oropharynx is clear and moist.  Eyes: Pupils are equal, round, and reactive to light.  Neck: Neck supple. No tracheal deviation present. No thyromegaly present.  Cardiovascular: Normal rate, regular rhythm, normal heart sounds and intact distal pulses.   Pulmonary/Chest: Effort normal and breath sounds normal. No accessory muscle usage. No respiratory distress.  Abdominal: Soft. Bowel sounds are normal. He exhibits no distension. There is no tenderness.  Musculoskeletal: Normal  range of motion. He exhibits no edema and no tenderness.  Neurological: He is alert and oriented to person, place, and time.       Steady gait.   Skin: Skin is warm and dry.  Psychiatric:       Depressed mood, flat affect.     ED Course  Procedures (including critical care time)   Labs Reviewed  CBC  COMPREHENSIVE METABOLIC PANEL  ETHANOL  URINE RAPID DRUG SCREEN (HOSP PERFORMED)   Results for orders placed during the hospital encounter of 03/08/12  CBC      Component Value Range   WBC 12.9 (*) 4.0 - 10.5 K/uL   RBC 5.47  4.22 - 5.81 MIL/uL   Hemoglobin 14.9  13.0 - 17.0 g/dL   HCT 16.1  09.6 - 04.5 %   MCV 74.0 (*) 78.0 - 100.0 fL   MCH 27.2  26.0 - 34.0 pg   MCHC 36.8 (*) 30.0 - 36.0 g/dL   RDW 40.9 (*) 81.1 - 91.4 %   Platelets 345  150 - 400 K/uL  COMPREHENSIVE METABOLIC PANEL      Component Value Range   Sodium 130 (*) 135 - 145 mEq/L   Potassium 2.4 (*) 3.5 - 5.1 mEq/L   Chloride 88 (*) 96 - 112 mEq/L   CO2 23  19 - 32 mEq/L   Glucose, Bld 147 (*) 70 - 99 mg/dL   BUN 13  6 - 23 mg/dL   Creatinine, Ser 7.82  0.50 - 1.35 mg/dL   Calcium 9.7  8.4 - 95.6 mg/dL   Total Protein 7.7  6.0 - 8.3 g/dL   Albumin 4.1  3.5 - 5.2 g/dL   AST 51 (*) 0 - 37 U/L   ALT 71 (*) 0 - 53 U/L   Alkaline Phosphatase 87  39 - 117 U/L   Total Bilirubin 1.0  0.3 - 1.2 mg/dL   GFR calc non Af Amer 61 (*) >90 mL/min   GFR calc Af Amer 71 (*) >90 mL/min  ETHANOL      Component Value Range   Alcohol, Ethyl (B) <11  0 - 11 mg/dL       MDM  Labs. telepsych eval.  Reviewed nursing notes and prior charts for additional history.   k 2.4.  Ecg, monitor. Mg added to labs. kcl iv  and po ordered.  Given k 2.4, unable to place into psychiatric facility, will admit to med service for k replacement, telepsych consult, hospitalist service called to admit.   [facility] tele, team 5, obs.   Date: 03/08/2012  Rate: 92  Rhythm: normal sinus rhythm  QRS Axis: normal  Intervals: normal   ST/T Wave abnormalities: nonspecific T wave changes  Conduction Disutrbances:uwave  Narrative Interpretation:   Old EKG Reviewed: changes noted          Suzi Roots, MD 03/08/12 1610  Suzi Roots, MD 03/08/12 [phone]

## 2012-03-08 NOTE — H&P (Signed)
Patient's PCP: Dorrene German, MD  Chief Complaint: Depression and suicidal thoughts  History of Present Illness: Phillip Turner is a 51 y.o. African American male with history of hypertension, diabetes, hypothyroidism, anxiety, depression, history of CVA, alcohol use, and cocaine use who presents with the above complaints.  Patient reports that he has had feelings of depression and has been having thoughts about hurting himself.  He reports that he drinks one or 2 cans of beer daily mainly to help with depression and stress.  He occasionally drinks brandy with his beers.  He also uses cocaine perhaps once a month and last use yesterday.  He presented to the ER wanting to be detoxed.  In the emergency department he was found to be hypokalemic and the hospitalist service was asked to admit the patient for further care and management.  Patient has chronic lower extremity edema and is on alternating regimen of furosemide and metolazone.  Patient reports that he forgets taking potassium with his diuretics.  Has not had any recent fevers, chills, nausea, vomiting, chest pain, shortness of breath, abdominal pain, diarrhea, or headaches.  He does report intermittent vision changes, currently denies any changes in his vision and has had ophthalmology exam this year.  Past Medical History  Diagnosis Date  . Diabetes mellitus   . Hypertension   . Stroke   . Hypothyroidism   . Anxiety   . Depression    Past Surgical History  Procedure Date  . Tonsillectomy    Family History  Problem Relation Age of Onset  . Coronary artery disease Mother   . Diabetes type II Father   . Hypertension Father    History   Social History  . Marital Status: Married    Spouse Name: N/A    Number of Children: N/A  . Years of Education: N/A   Occupational History  . Not on file.   Social History Main Topics  . Smoking status: Former Games developer  . Smokeless tobacco: Never Used  . Alcohol Use: 16.8 oz/week    28  Cans of beer per week     Comment: Pt consumes beers occasionally  . Drug Use: No     Comment: Cocaine  . Sexually Active: No   Other Topics Concern  . Not on file   Social History Narrative  . No narrative on file   Social history: Patient reported that he drinks one or 2 cans of 40 ounce of beer daily with brandy use at times.  Reports using cocaine once a month and last use was on 03/07/2012.  Allergies: Shellfish allergy  Home Meds: Prior to Admission medications   Medication Sig Start Date End Date Taking? Authorizing Provider  albuterol (PROVENTIL) (2.5 MG/3ML) 0.083% nebulizer solution Take 3 mLs (2.5 mg total) by nebulization every 6 (six) hours as needed for wheezing. 12/11/11 12/10/12 Yes Loren Racer, MD  aspirin 325 MG tablet Take 325 mg by mouth daily.     Yes Historical Provider, MD  colchicine 0.6 MG tablet Take 0.6 mg by mouth daily. Gout Flare   Yes Historical Provider, MD  escitalopram (LEXAPRO) 5 MG tablet Take 5 mg by mouth daily.   Yes Historical Provider, MD  febuxostat (ULORIC) 40 MG tablet Take 40 mg by mouth daily.     Yes Historical Provider, MD  furosemide (LASIX) 20 MG tablet Take 20 mg by mouth 2 (two) times daily.   Yes Historical Provider, MD  levothyroxine (SYNTHROID, LEVOTHROID) 200 MCG tablet Take 200 mcg  by mouth daily.   Yes Historical Provider, MD  lisinopril (PRINIVIL,ZESTRIL) 10 MG tablet Take 10 mg by mouth daily.     Yes Historical Provider, MD  metFORMIN (GLUCOPHAGE) 500 MG tablet Take 1,000 mg by mouth daily with breakfast.   Yes Historical Provider, MD  potassium chloride (KLOR-CON) 20 MEQ packet Take 40 mEq by mouth 2 (two) times daily. 03/31/11 03/30/12 Yes Altha Harm, MD  simvastatin (ZOCOR) 20 MG tablet Take 20 mg by mouth daily.     Yes Historical Provider, MD    Review of Systems: All systems reviewed with the patient and positive as per history of present illness, otherwise all other systems are negative.  Physical  Exam: Blood pressure 168/111, pulse 90, temperature 98 F (36.7 C), temperature source Oral, resp. rate 20, height 5\' 10"  (1.778 m), weight 138.347 kg (305 lb), SpO2 94.00%. General: Awake, Oriented x3, No acute distress. HEENT: EOMI, Moist mucous membranes Neck: Supple CV: S1 and S2 Lungs: Clear to ascultation bilaterally Abdomen: Soft, Nontender, Nondistended, +bowel sounds. Ext: Good pulses. 1+ LE edema. No clubbing or cyanosis noted. Neuro: Cranial Nerves II-XII grossly intact. Has 5/5 motor strength in upper and lower extremities.  Lab results:  Basename 03/08/12 0730  NA 130*  K 2.4*  CL 88*  CO2 23  GLUCOSE 147*  BUN 13  CREATININE 1.32  CALCIUM 9.7  MG 2.3  PHOS --    Basename 03/08/12 0730  AST 51*  ALT 71*  ALKPHOS 87  BILITOT 1.0  PROT 7.7  ALBUMIN 4.1   No results found for this basename: LIPASE:2,AMYLASE:2 in the last 72 hours  Basename 03/08/12 0730  WBC 12.9*  NEUTROABS --  HGB 14.9  HCT 40.5  MCV 74.0*  PLT 345   No results found for this basename: CKTOTAL:3,CKMB:3,CKMBINDEX:3,TROPONINI:3 in the last 72 hours No components found with this basename: POCBNP:3 No results found for this basename: DDIMER in the last 72 hours No results found for this basename: HGBA1C:2 in the last 72 hours No results found for this basename: CHOL:2,HDL:2,LDLCALC:2,TRIG:2,CHOLHDL:2,LDLDIRECT:2 in the last 72 hours No results found for this basename: TSH,T4TOTAL,FREET3,T3FREE,THYROIDAB in the last 72 hours No results found for this basename: VITAMINB12:2,FOLATE:2,FERRITIN:2,TIBC:2,IRON:2,RETICCTPCT:2 in the last 72 hours Imaging results:  No results found. Other results: EKG: Normal sinus rhythm unchanged from previous EKG.  Assessment & Plan by Problem: Hypokalemia Likely due to diuretics and noncompliance with his supplemental potassium.  Hold diuretics today replace potassium both IV and by mouth.  Continue on telemetry, EKG shows normal sinus rhythm.  Magnesium  normal.  Polysubstance abuse Patient counseled on cessation with alcohol and cocaine.  Will request social work and psychiatry consultation for further input.  Send urine drug screen.  Alcohol abuse Again counseled on cessation.  Started the patient on CIWA protocol. Thiamine and folic acid stated.  Depression with suicidal ideation Continue home psychiatric medications.  Psychiatry consulted for further input.  Request a Recruitment consultant.  Currently patient is endorsing suicidal thoughts but has no plan.  Diabetes type 2 uncontrolled with complications Continue metformin.  Add glyburide to his regimen.  Sliding scale insulin.  Diabetic diet.  Send hemoglobin A1c.  Hypertension Continue home antihypertensive medications.  No beta blockers given recent cocaine use.  Hyponatremia Likely due to uncontrolled diabetes and diuretics.  Condition monitor.  Chronic lower extremity edema Continue home furosemide and metolazone.  Leukocytosis Likely reactive.  Patient does not endorsing any dysuria or cough, do not see the need for further evaluation  of this time.  Hypothyroidism Continue levothyroxine.  Prophylaxis Lovenox.  CODE STATUS Full code, given patient's depression and suicidal thoughts.  Disposition Admit the patient as observation to telemetry.  Time spent on admission, talking to the patient, and coordinating care was: 60 mins.  Ronette Hank A, MD 03/08/2012, 11:49 AM

## 2012-03-08 NOTE — ED Notes (Signed)
Pt transferred to room 22 for further eval, ambulatory w/o difficulty. 1 bag of belongings w/ pt

## 2012-03-08 NOTE — ED Notes (Signed)
Pt escorted to room 22 by nurse Wille Celeste. One bag of belongings placed behind nurses station. Pt eating breakfast.

## 2012-03-08 NOTE — ED Notes (Signed)
Patient is alert and oriented x3.  He is complaining of depression with SI. He has no plan on how he would accomplish his SI. He denies any HI. He denies pain.

## 2012-03-09 ENCOUNTER — Encounter (HOSPITAL_COMMUNITY): Payer: Self-pay | Admitting: *Deleted

## 2012-03-09 ENCOUNTER — Inpatient Hospital Stay (HOSPITAL_COMMUNITY)
Admission: AD | Admit: 2012-03-09 | Discharge: 2012-03-17 | DRG: 885 | Disposition: A | Discharge status: Home / Self Care | Payer: Medicare (Managed Care) | Source: Ambulatory Visit | Attending: Psychiatry | Admitting: Psychiatry

## 2012-03-09 DIAGNOSIS — F419 Anxiety disorder, unspecified: Secondary | ICD-10-CM

## 2012-03-09 DIAGNOSIS — F4310 Post-traumatic stress disorder, unspecified: Secondary | ICD-10-CM | POA: Diagnosis present

## 2012-03-09 DIAGNOSIS — E876 Hypokalemia: Secondary | ICD-10-CM

## 2012-03-09 DIAGNOSIS — F1010 Alcohol abuse, uncomplicated: Secondary | ICD-10-CM

## 2012-03-09 DIAGNOSIS — Z8673 Personal history of transient ischemic attack (TIA), and cerebral infarction without residual deficits: Secondary | ICD-10-CM

## 2012-03-09 DIAGNOSIS — E118 Type 2 diabetes mellitus with unspecified complications: Secondary | ICD-10-CM

## 2012-03-09 DIAGNOSIS — R413 Other amnesia: Secondary | ICD-10-CM

## 2012-03-09 DIAGNOSIS — F32A Depression, unspecified: Secondary | ICD-10-CM

## 2012-03-09 DIAGNOSIS — I10 Essential (primary) hypertension: Secondary | ICD-10-CM

## 2012-03-09 DIAGNOSIS — F329 Major depressive disorder, single episode, unspecified: Secondary | ICD-10-CM

## 2012-03-09 DIAGNOSIS — F1210 Cannabis abuse, uncomplicated: Secondary | ICD-10-CM | POA: Diagnosis present

## 2012-03-09 DIAGNOSIS — F339 Major depressive disorder, recurrent, unspecified: Secondary | ICD-10-CM

## 2012-03-09 DIAGNOSIS — F332 Major depressive disorder, recurrent severe without psychotic features: Principal | ICD-10-CM | POA: Diagnosis present

## 2012-03-09 DIAGNOSIS — D72829 Elevated white blood cell count, unspecified: Secondary | ICD-10-CM

## 2012-03-09 DIAGNOSIS — E119 Type 2 diabetes mellitus without complications: Secondary | ICD-10-CM | POA: Diagnosis present

## 2012-03-09 DIAGNOSIS — F1999 Other psychoactive substance use, unspecified with unspecified psychoactive substance-induced disorder: Secondary | ICD-10-CM

## 2012-03-09 DIAGNOSIS — E871 Hypo-osmolality and hyponatremia: Secondary | ICD-10-CM

## 2012-03-09 DIAGNOSIS — Z91199 Patient's noncompliance with other medical treatment and regimen due to unspecified reason: Secondary | ICD-10-CM

## 2012-03-09 DIAGNOSIS — F1020 Alcohol dependence, uncomplicated: Secondary | ICD-10-CM

## 2012-03-09 DIAGNOSIS — F411 Generalized anxiety disorder: Secondary | ICD-10-CM | POA: Diagnosis present

## 2012-03-09 DIAGNOSIS — E039 Hypothyroidism, unspecified: Secondary | ICD-10-CM

## 2012-03-09 DIAGNOSIS — IMO0002 Reserved for concepts with insufficient information to code with codable children: Secondary | ICD-10-CM

## 2012-03-09 DIAGNOSIS — Z9119 Patient's noncompliance with other medical treatment and regimen: Secondary | ICD-10-CM

## 2012-03-09 DIAGNOSIS — F1410 Cocaine abuse, uncomplicated: Secondary | ICD-10-CM

## 2012-03-09 LAB — GLUCOSE, CAPILLARY
Glucose-Capillary: 238 mg/dL — ABNORMAL HIGH (ref 70–99)
Glucose-Capillary: 120 mg/dL — ABNORMAL HIGH (ref 70–99)
Glucose-Capillary: 176 mg/dL — ABNORMAL HIGH (ref 70–99)

## 2012-03-09 LAB — BASIC METABOLIC PANEL
BUN: 19 mg/dL (ref 6–23)
Calcium: 9 mg/dL (ref 8.4–10.5)
GFR calc non Af Amer: 51 mL/min — ABNORMAL LOW (ref >90)
Glucose, Bld: 204 mg/dL — ABNORMAL HIGH (ref 70–99)

## 2012-03-09 MED ORDER — ADULT MULTIVITAMIN W/MINERALS CH
1.0000 | ORAL_TABLET | Freq: Every day | ORAL | Status: DC
  Filled 2012-03-09 (×2): qty 1

## 2012-03-09 MED ORDER — ESCITALOPRAM OXALATE 10 MG PO TABS
10.0000 mg | ORAL_TABLET | Freq: Every day | ORAL | Status: DC
  Administered 2012-03-10: 10 mg via ORAL
  Filled 2012-03-09 (×3): qty 1

## 2012-03-09 MED ORDER — ADULT MULTIVITAMIN W/MINERALS CH
1.0000 | ORAL_TABLET | Freq: Every day | ORAL | Status: DC
  Administered 2012-03-10 – 2012-03-17 (×8): 1 via ORAL
  Filled 2012-03-09 (×10): qty 1

## 2012-03-09 MED ORDER — CHLORDIAZEPOXIDE HCL 25 MG PO CAPS
50.0000 mg | ORAL_CAPSULE | Freq: Once | ORAL | Status: AC
  Administered 2012-03-09: 50 mg via ORAL
  Filled 2012-03-09: qty 2

## 2012-03-09 MED ORDER — LEVOTHYROXINE SODIUM 200 MCG PO TABS
200.0000 ug | ORAL_TABLET | Freq: Every day | ORAL | Status: DC
  Administered 2012-03-10 – 2012-03-15 (×6): 200 ug via ORAL
  Filled 2012-03-09 (×8): qty 1

## 2012-03-09 MED ORDER — FOLIC ACID 1 MG PO TABS
1.0000 mg | ORAL_TABLET | Freq: Every day | ORAL | Status: DC
  Administered 2012-03-10: 1 mg via ORAL
  Filled 2012-03-09 (×3): qty 1

## 2012-03-09 MED ORDER — HYDROXYZINE HCL 25 MG PO TABS
25.0000 mg | ORAL_TABLET | Freq: Four times a day (QID) | ORAL | Status: AC | PRN
  Administered 2012-03-11: 25 mg via ORAL

## 2012-03-09 MED ORDER — LISINOPRIL 10 MG PO TABS: 10.0000 mg | ORAL_TABLET | Freq: Every day | ORAL | Status: DC

## 2012-03-09 MED ORDER — GLYBURIDE 2.5 MG PO TABS
2.5000 mg | ORAL_TABLET | Freq: Two times a day (BID) | ORAL | Status: DC
  Administered 2012-03-10: 2.5 mg via ORAL
  Filled 2012-03-09 (×5): qty 1

## 2012-03-09 MED ORDER — ALUM & MAG HYDROXIDE-SIMETH 200-200-20 MG/5ML PO SUSP: 30.0000 mL | ORAL | Status: DC | PRN

## 2012-03-09 MED ORDER — METFORMIN HCL 500 MG PO TABS: 1000.0000 mg | ORAL_TABLET | Freq: Every day | ORAL | Status: DC

## 2012-03-09 MED ORDER — CHLORDIAZEPOXIDE HCL 25 MG PO CAPS
25.0000 mg | ORAL_CAPSULE | Freq: Every day | ORAL | Status: AC
  Administered 2012-03-14: 25 mg via ORAL
  Filled 2012-03-09: qty 1

## 2012-03-09 MED ORDER — THIAMINE HCL 100 MG/ML IJ SOLN: 100.0000 mg | Freq: Once | INTRAMUSCULAR | Status: DC

## 2012-03-09 MED ORDER — COLCHICINE 0.6 MG PO TABS
0.6000 mg | ORAL_TABLET | Freq: Every day | ORAL | Status: DC
  Administered 2012-03-10: 0.6 mg via ORAL
  Filled 2012-03-09 (×3): qty 1

## 2012-03-09 MED ORDER — VITAMIN B-1 100 MG PO TABS
100.0000 mg | ORAL_TABLET | Freq: Every day | ORAL | Status: DC
  Administered 2012-03-10 – 2012-03-17 (×8): 100 mg via ORAL
  Filled 2012-03-09 (×10): qty 1

## 2012-03-09 MED ORDER — SIMVASTATIN 20 MG PO TABS
20.0000 mg | ORAL_TABLET | Freq: Every day | ORAL | Status: DC
  Filled 2012-03-09: qty 1

## 2012-03-09 MED ORDER — THIAMINE HCL 100 MG PO TABS: 100.0000 mg | ORAL_TABLET | Freq: Every day | ORAL | Status: DC

## 2012-03-09 MED ORDER — ACETAMINOPHEN 325 MG PO TABS
650.0000 mg | ORAL_TABLET | Freq: Four times a day (QID) | ORAL | Status: DC | PRN
  Administered 2012-03-09: 650 mg via ORAL

## 2012-03-09 MED ORDER — CHLORDIAZEPOXIDE HCL 25 MG PO CAPS
25.0000 mg | ORAL_CAPSULE | Freq: Three times a day (TID) | ORAL | Status: AC
  Administered 2012-03-11 – 2012-03-12 (×3): 25 mg via ORAL
  Filled 2012-03-09 (×3): qty 1

## 2012-03-09 MED ORDER — LORAZEPAM 1 MG PO TABS
1.0000 mg | ORAL_TABLET | Freq: Four times a day (QID) | ORAL | Status: DC | PRN
  Administered 2012-03-09: 1 mg via ORAL
  Filled 2012-03-09: qty 1

## 2012-03-09 MED ORDER — MAGNESIUM HYDROXIDE 400 MG/5ML PO SUSP: 30.0000 mL | Freq: Every day | ORAL | Status: DC | PRN

## 2012-03-09 MED ORDER — FEBUXOSTAT 40 MG PO TABS
40.0000 mg | ORAL_TABLET | Freq: Every day | ORAL | Status: DC
  Administered 2012-03-10: 40 mg via ORAL
  Filled 2012-03-09 (×3): qty 1

## 2012-03-09 MED ORDER — METOLAZONE 5 MG PO TABS: 5.0000 mg | ORAL_TABLET | ORAL | Status: DC

## 2012-03-09 MED ORDER — ONDANSETRON 4 MG PO TBDP: 4.0000 mg | ORAL_TABLET | Freq: Four times a day (QID) | ORAL | Status: AC | PRN

## 2012-03-09 MED ORDER — GLYBURIDE 2.5 MG PO TABS: 2.5000 mg | ORAL_TABLET | Freq: Two times a day (BID) | ORAL | Status: DC

## 2012-03-09 MED ORDER — INSULIN ASPART 100 UNIT/ML ~~LOC~~ SOLN
0.0000 [IU] | Freq: Three times a day (TID) | SUBCUTANEOUS | Status: DC
  Administered 2012-03-10 (×2): 1 [IU] via SUBCUTANEOUS
  Administered 2012-03-10: 2 [IU] via SUBCUTANEOUS
  Administered 2012-03-11: 3 [IU] via SUBCUTANEOUS
  Administered 2012-03-11 – 2012-03-12 (×3): 1 [IU] via SUBCUTANEOUS
  Administered 2012-03-12: 3 [IU] via SUBCUTANEOUS
  Administered 2012-03-12: 1 [IU] via SUBCUTANEOUS
  Administered 2012-03-13: 2 [IU] via SUBCUTANEOUS
  Administered 2012-03-13: 7 [IU] via SUBCUTANEOUS
  Administered 2012-03-13 – 2012-03-14 (×2): 2 [IU] via SUBCUTANEOUS
  Administered 2012-03-14: 07:00:00 via SUBCUTANEOUS
  Administered 2012-03-14: 1 [IU] via SUBCUTANEOUS
  Administered 2012-03-15 – 2012-03-16 (×4): 2 [IU] via SUBCUTANEOUS
  Administered 2012-03-17: 1 [IU] via SUBCUTANEOUS

## 2012-03-09 MED ORDER — METOLAZONE 5 MG PO TABS
5.0000 mg | ORAL_TABLET | ORAL | Status: DC
  Filled 2012-03-09: qty 1

## 2012-03-09 MED ORDER — VITAMIN B-1 100 MG PO TABS
100.0000 mg | ORAL_TABLET | Freq: Every day | ORAL | Status: DC
  Filled 2012-03-09 (×2): qty 1

## 2012-03-09 MED ORDER — COLCHICINE 0.6 MG PO TABS: 0.6000 mg | ORAL_TABLET | Freq: Every day | ORAL | Status: DC

## 2012-03-09 MED ORDER — CHLORDIAZEPOXIDE HCL 25 MG PO CAPS
25.0000 mg | ORAL_CAPSULE | Freq: Four times a day (QID) | ORAL | Status: AC
  Administered 2012-03-10 – 2012-03-11 (×5): 25 mg via ORAL
  Filled 2012-03-09 (×4): qty 1

## 2012-03-09 MED ORDER — FUROSEMIDE 20 MG PO TABS: 20.0000 mg | ORAL_TABLET | ORAL | Status: DC

## 2012-03-09 MED ORDER — FEBUXOSTAT 40 MG PO TABS: 40.0000 mg | ORAL_TABLET | Freq: Every day | ORAL | Status: DC

## 2012-03-09 MED ORDER — FOLIC ACID 1 MG PO TABS: 1.0000 mg | ORAL_TABLET | Freq: Every day | ORAL | Status: DC

## 2012-03-09 MED ORDER — LOPERAMIDE HCL 2 MG PO CAPS: 2.0000 mg | ORAL_CAPSULE | ORAL | Status: AC | PRN

## 2012-03-09 MED ORDER — POTASSIUM CHLORIDE 20 MEQ PO PACK: 40.0000 meq | PACK | Freq: Two times a day (BID) | ORAL | Status: DC

## 2012-03-09 MED ORDER — INFLUENZA VIRUS VACC SPLIT PF IM SUSP: 0.5000 mL | INTRAMUSCULAR | Status: AC

## 2012-03-09 MED ORDER — CHLORDIAZEPOXIDE HCL 25 MG PO CAPS
25.0000 mg | ORAL_CAPSULE | ORAL | Status: AC
  Administered 2012-03-12 – 2012-03-13 (×2): 25 mg via ORAL
  Filled 2012-03-09 (×2): qty 1

## 2012-03-09 MED ORDER — SIMVASTATIN 20 MG PO TABS: 20.0000 mg | ORAL_TABLET | Freq: Every day | ORAL | Status: DC

## 2012-03-09 MED ORDER — FUROSEMIDE 20 MG PO TABS
20.0000 mg | ORAL_TABLET | ORAL | Status: DC
  Filled 2012-03-09 (×2): qty 1

## 2012-03-09 MED ORDER — ALBUTEROL SULFATE (5 MG/ML) 0.5% IN NEBU
2.5000 mg | INHALATION_SOLUTION | Freq: Four times a day (QID) | RESPIRATORY_TRACT | Status: DC | PRN
  Administered 2012-03-16: 2.5 mg via RESPIRATORY_TRACT
  Filled 2012-03-09: qty 0.5

## 2012-03-09 MED ORDER — ESCITALOPRAM OXALATE 10 MG PO TABS: 10.0000 mg | ORAL_TABLET | Freq: Every day | ORAL | Status: DC

## 2012-03-09 MED ORDER — POLYETHYLENE GLYCOL 3350 17 G PO PACK: 17.0000 g | PACK | Freq: Every day | ORAL | Status: DC | PRN

## 2012-03-09 MED ORDER — LISINOPRIL 10 MG PO TABS
10.0000 mg | ORAL_TABLET | Freq: Every day | ORAL | Status: DC
  Administered 2012-03-10 – 2012-03-17 (×8): 10 mg via ORAL
  Filled 2012-03-09 (×10): qty 1

## 2012-03-09 MED ORDER — ENOXAPARIN SODIUM 80 MG/0.8ML ~~LOC~~ SOLN
70.0000 mg | SUBCUTANEOUS | Status: DC
  Filled 2012-03-09: qty 0.8

## 2012-03-09 MED ORDER — POTASSIUM CHLORIDE CRYS ER 20 MEQ PO TBCR
40.0000 meq | EXTENDED_RELEASE_TABLET | Freq: Two times a day (BID) | ORAL | Status: DC
  Administered 2012-03-10: 40 meq via ORAL
  Filled 2012-03-09 (×4): qty 2

## 2012-03-09 MED ORDER — LEVOTHYROXINE SODIUM 200 MCG PO TABS: 200.0000 ug | ORAL_TABLET | Freq: Every day | ORAL | Status: DC

## 2012-03-09 MED ORDER — THIAMINE HCL 100 MG/ML IJ SOLN: 100.0000 mg | Freq: Every day | INTRAMUSCULAR | Status: DC

## 2012-03-09 MED ORDER — ASPIRIN 325 MG PO TABS
325.0000 mg | ORAL_TABLET | Freq: Every day | ORAL | Status: DC
  Administered 2012-03-10 – 2012-03-17 (×8): 325 mg via ORAL
  Filled 2012-03-09 (×11): qty 1

## 2012-03-09 MED ORDER — LORAZEPAM 2 MG/ML IJ SOLN: 1.0000 mg | Freq: Four times a day (QID) | INTRAMUSCULAR | Status: DC | PRN

## 2012-03-09 NOTE — BH Assessment (Addendum)
Assessment Note   Phillip Turner is an 51 y.o. male. Phillip Turner is a 51 y.o. . In the emergency department he was found to be hypokalemic and the hospitalist service was asked to admit the patient for further care and management.  African American male with history of hypertension, diabetes, hypothyroidism, anxiety, depression, history of CVA, alcohol use, and cocaine use who was admitted to medical unit 03/08/2012. Patient has chronic lower extremity edema and is on alternating regimen of furosemide and metolazone. Pt reports that he has thoughts to harm himself and plays out ways in which to do so.  Now reports he is hopeless and that he's disappointed in himself.  Pt reports that he takes himself to the hospital when he feels this way in order to keep himself safe. Patient reports that he has had feelings of depression and has been having thoughts about hurting himself for a week. He reports that he drinks one or 2 cans of beer daily and some brandy, mainly to help with depression and stress. He also uses cocaine perhaps once or twice a month and last use yesterday. .  Pt reports experiencing visual hallucinations 2 nights ago. Pt attributes this to not having slept in several days He presented to the ER wanting to be detoxed. Accepted for inpatient care by N Mashburn PA C  Axis I: Major Depression, Recurrent severe and Substance Abuse Axis II: Deferred Axis III:  Past Medical History  Diagnosis Date  . Diabetes mellitus   . Hypertension   . Stroke   . Hypothyroidism   . Anxiety   . Depression    Axis IV: other psychosocial or environmental problems, problems with access to health care services and problems with primary support group Axis V: 21-30 behavior considerably influenced by delusions or hallucinations OR serious impairment in judgment, communication OR inability to function in almost all areas  Past Medical History:  Past Medical History  Diagnosis Date  . Diabetes mellitus     . Hypertension   . Stroke   . Hypothyroidism   . Anxiety   . Depression     Past Surgical History  Procedure Date  . Tonsillectomy     Family History:  Family History  Problem Relation Age of Onset  . Coronary artery disease Mother   . Diabetes type II Father   . Hypertension Father     Social History:  reports that he has quit smoking. He has never used smokeless tobacco. He reports that he drinks about 16.8 ounces of alcohol per week. He reports that he does not use illicit drugs.  Additional Social History:     CIWA:   COWS:    Allergies:  Allergies  Allergen Reactions  . Shellfish Allergy Anaphylaxis and Nausea And Vomiting    Home Medications:  Medications Prior to Admission  Medication Sig Dispense Refill  . albuterol (PROVENTIL) (2.5 MG/3ML) 0.083% nebulizer solution Take 3 mLs (2.5 mg total) by nebulization every 6 (six) hours as needed for wheezing.  75 mL  12  . aspirin 325 MG tablet Take 325 mg by mouth daily.        . colchicine 0.6 MG tablet Take 1 tablet (0.6 mg total) by mouth daily. Gout Flare  10 tablet  0  . escitalopram (LEXAPRO) 10 MG tablet Take 1 tablet (10 mg total) by mouth daily.  10 tablet  0  . febuxostat (ULORIC) 40 MG tablet Take 1 tablet (40 mg total) by mouth daily.  10  tablet  0  . folic acid (FOLVITE) 1 MG tablet Take 1 tablet (1 mg total) by mouth daily.  30 tablet  0  . furosemide (LASIX) 20 MG tablet Take 1 tablet (20 mg total) by mouth every other day.  5 tablet  0  . glyBURIDE (DIABETA) 2.5 MG tablet Take 1 tablet (2.5 mg total) by mouth 2 (two) times daily with a meal.  60 tablet  0  . levothyroxine (SYNTHROID, LEVOTHROID) 200 MCG tablet Take 1 tablet (200 mcg total) by mouth daily.  10 tablet  0  . lisinopril (PRINIVIL,ZESTRIL) 10 MG tablet Take 1 tablet (10 mg total) by mouth daily.  10 tablet  0  . metFORMIN (GLUCOPHAGE) 500 MG tablet Take 2 tablets (1,000 mg total) by mouth daily with breakfast.  20 tablet  0  . metolazone  (ZAROXOLYN) 5 MG tablet Take 1 tablet (5 mg total) by mouth every other day. Alternates with furosemide  5 tablet  0  . potassium chloride (KLOR-CON) 20 MEQ packet Take 40 mEq by mouth 2 (two) times daily.  60 tablet  0  . simvastatin (ZOCOR) 20 MG tablet Take 1 tablet (20 mg total) by mouth daily.  10 tablet  0  . thiamine 100 MG tablet Take 1 tablet (100 mg total) by mouth daily.  10 tablet  0    OB/GYN Status:  No LMP for male patient.  General Assessment Data Location of Assessment: [facility] Assessment Services Living Arrangements: Children Can pt return to current living arrangement?: Yes Admission Status: Voluntary Is patient capable of signing voluntary admission?: Yes Transfer from: Acute Hospital Referral Source: MD  Education Status Is patient currently in school?: No  Risk to self Suicidal Ideation: Yes-Currently Present Suicidal Intent: No-Not Currently/Within Last 6 Months Is patient at risk for suicide?: Yes Suicidal Plan?: Yes-Currently Present (die by OD) Specify Current Suicidal Plan: die by OD Access to Means: Yes Specify Access to Suicidal Means: street drugs What has been your use of drugs/alcohol within the last 12 months?: cocaine and alcohol Previous Attempts/Gestures: Yes How many times?: 2  Other Self Harm Risks: drug use Triggers for Past Attempts:  (can't stop using) Intentional Self Injurious Behavior: None Family Suicide History: Unknown Recent stressful life event(s): Conflict (Comment) Persecutory voices/beliefs?: No Depression: Yes Depression Symptoms: Despondent;Insomnia;Fatigue;Guilt;Feeling worthless/self pity Substance abuse history and/or treatment for substance abuse?: Yes Suicide prevention information given to non-admitted patients: Not applicable  Risk to Others Homicidal Ideation: No Thoughts of Harm to Others: No Current Homicidal Intent: No Current Homicidal Plan: No Access to Homicidal Means: No History of harm to others?:  No Assessment of Violence: None Noted Does patient have access to weapons?: No Criminal Charges Pending?: No Does patient have a court date: No  Psychosis Hallucinations: None noted Delusions: None noted  Mental Status Report Appear/Hygiene: Other (Comment) (in hosp overnight--scrubs) Eye Contact: Fair Motor Activity: Unremarkable Speech: Logical/coherent Level of Consciousness: Quiet/awake Mood: Depressed;Anxious;Ashamed/humiliated Affect: Anxious;Depressed Anxiety Level: Minimal Thought Processes: Coherent;Relevant Judgement: Impaired Orientation: Person;Place;Time;Situation Obsessive Compulsive Thoughts/Behaviors: None  Cognitive Functioning Concentration: Decreased Memory: Recent Intact;Remote Intact IQ: Average Insight: Poor Impulse Control: Poor Appetite: Fair Weight Loss: 0  Weight Gain: 0  Sleep: Decreased Total Hours of Sleep: 3  Vegetative Symptoms: Decreased grooming  ADLScreening [facility] Assessment Services) Patient's cognitive ability adequate to safely complete daily activities?: Yes Patient able to express need for assistance with ADLs?: Yes Independently performs ADLs?: Yes (appropriate for developmental age)  Abuse/Neglect [facility]) Physical Abuse: Denies Verbal Abuse:  Denies Sexual Abuse: Denies  Prior Inpatient Therapy Prior Inpatient Therapy: Yes Prior Therapy Dates: november 2012 Prior Therapy Facilty/Provider(s): [facility] Reason for Treatment: depression, AH, SA  Prior Outpatient Therapy Prior Outpatient Therapy: No Prior Therapy Dates: poor follow up  ADL Screening (condition at time of admission) Patient's cognitive ability adequate to safely complete daily activities?: Yes Patient able to express need for assistance with ADLs?: Yes Independently performs ADLs?: Yes (appropriate for developmental age) Weakness of Legs: None Weakness of Arms/Hands: Right (post CVA)  Home Assistive Devices/Equipment Home Assistive Devices/Equipment:  Eyeglasses    Abuse/Neglect Assessment (Assessment to be complete while patient is alone) Physical Abuse: Denies Verbal Abuse: Denies Sexual Abuse: Denies Exploitation of patient/patient's resources: Denies Self-Neglect: Denies       Nutrition Screen- [facility] Adult/WL/AP Patient's home diet: Regular Have you recently lost weight without trying?: No Have you been eating poorly because of a decreased appetite?: No Malnutrition Screening Tool Score: 0   Additional Information 1:1 In Past 12 Months?: No CIRT Risk: No Elopement Risk: No Does patient have medical clearance?: Yes     Disposition:  Disposition Disposition of Patient: Inpatient treatment program Type of inpatient treatment program: Adult  On Site Evaluation by:   Reviewed with Physician:     Conan Bowens 03/09/2012 7:18 PM

## 2012-03-09 NOTE — Tx Team (Signed)
Initial Interdisciplinary Treatment Plan  PATIENT STRENGTHS: (choose at least two) Ability for insight Active sense of humor Average or above average intelligence Capable of independent living Communication skills General fund of knowledge Motivation for treatment/growth Physical Health Religious Affiliation Supportive family/friends  PATIENT STRESSORS: Financial difficulties Health problems Medication change or noncompliance Substance abuse   PROBLEM LIST: Problem List/Patient Goals Date to be addressed Date deferred Reason deferred Estimated date of resolution  "Help to make me a responsible person". 03/09/12           "Give me tools necessary to make the right decisions" 03/09/12           Depression 03/09/12     Substance abuse 03/09/12     Increased risk for sucide 03/09/12                  DISCHARGE CRITERIA:  Ability to meet basic life and health needs Adequate post-discharge living arrangements Improved stabilization in mood, thinking, and/or behavior Medical problems require only outpatient monitoring Motivation to continue treatment in a less acute level of care Need for constant or close observation no longer present Reduction of life-threatening or endangering symptoms to within safe limits Safe-care adequate arrangements made Verbal commitment to aftercare and medication compliance Withdrawal symptoms are absent or subacute and managed without 24-hour nursing intervention  PRELIMINARY DISCHARGE PLAN: Attend PHP/IOP Attend 12-step recovery group Outpatient therapy Participate in family therapy  PATIENT/FAMIILY INVOLVEMENT: This treatment plan has been presented to and reviewed with the patient, Phillip Turner, and/or family member.  The patient and family have been given the opportunity to ask questions and make suggestions.  Phillip Turner [facility] 03/09/2012, 8:33 PM

## 2012-03-09 NOTE — Progress Notes (Signed)
Report called to RN at [facility], PT to be transferred to room 307 at [facility].

## 2012-03-09 NOTE — Progress Notes (Signed)
Clinical Social Work Department CLINICAL SOCIAL WORK PSYCHIATRY SERVICE LINE ASSESSMENT 03/09/2012  Patient:  Phillip Turner  Account:  [id number]  Admit Date:  03/08/2012  Clinical Social Worker:  Doroteo Glassman  Date/Time:  03/09/2012 09:50 AM Referred by:  Physician  Date referred:  03/09/2012 Reason for Referral  Behavioral Health Issues   Presenting Symptoms/Problems (In the person's/family's own words):   Pt drinks daily and uses crack weekly.  Pt seeking tx.    Abuse/Neglect/Trauma Comments:    Psychiatric medications:  Current Mental Health Hospitalizations/Previous Mental Health History:   Current provider:   Place and Date:   Current Medications:   Previous Impatient Admission/Date/Reason:   Emotional Health / Current Symptoms    Suicide/Self Harm  Suicidal ideation (ex: "I can't take any more,I wish I could disappear")   Suicide attempt in the past:   Other harmful behavior:   Pt reports that he has thoughts to harm himself and plays out ways in which to do so.  He states that this only occurs when he's coming down from a high.  Pt reports that he takes himself to the hospital when he feels this way in order to keep himself safe.    Other Psychotic/Dissociative Symptoms:   Pt reports experiencing visual hallucinations 2 nights ago. Pt attributes this to not having slept in several days.    Attention/Behavioral Symptoms  Within Normal Limits   Other Attention / Behavioral Symptoms:    Cognitive Impairment  Within Normal Limits   Other Cognitive Impairment:    Mood and Adjustment  Flat    Stress, Anxiety, Trauma, Any Recent Loss/Stressor  Other - See comment   Anxiety (frequency):   Phobia (specify):   Compulsive behavior (specify):   Obsessive behavior (specify):   Other:   Pt reports periods of time where he feels hopeless and that he's disappointed in himself.  He has significant anxiety and depression during these times.   Substance  Abuse/Use  Current substance use  Substance abuse treatment needed   SBIRT completed (please refer for detailed history):  N  Self-reported substance use:   Urinary Drug Screen Completed:  Y Alcohol level:    Environmental/Housing/Living Arrangement  Stable housing   Who is in the home:   Wife   Emergency contact:  Wife   Financial  Medicare  Medicaid   Patient's Strengths and Goals (patient's own words):   Clinical Social Worker's Interpretive Summary:   Met with Pt to discuss current admission and d/c plans.    Pt reports that he drinks 2 40oz of beer daily and smokes crack weekly.    Pt is on disability due to a stroke; Pt can't remember when he had this stroke or how long he's been on disability.    Pt reports that he has been to SA tx by hx but admits that he "went through the motions" during tx because he was trying to please others.  Pt states that he's tired of feeling disappointed in himself and is ready for help.    Pt reports that he drinks and uses drugs to escape his anxiety and depression.  He reports SI when he's coming down from a high.  Pt explains that he will begin to think about how disappointing he is and that these thoughts lead him to thinking about ways in which to kill himself.  Pt denies that he's attempted in the past, as he states that when he begins to feel this way, he takes himself  to the hospital.  Pt admitted to having SI 2 days ago but denies current SI, HI, AVH, paranoia, delusions.    Pt agreed to allow CSW to search for inpt SA txs on his behalf.  Pt is interested in [facility] (for detox) and [facility].    CSW thanked Pt for his time.   Disposition:  Recommend Psych CSW continuing to support while in hospital  CSW to continue to follow.  Providence Crosby, LCSWA Clinical Social Work [phone]

## 2012-03-09 NOTE — Discharge Summary (Signed)
Physician Discharge Summary  Phillip Turner WUJ:[id number] DOB: [birth year] DOA: 03/08/2012  PCP: Dorrene German, MD  Admit date: 03/08/2012 Discharge date: 03/09/2012  Recommendations for Outpatient Follow-up:  Followup with Fleet Contras A, MD (PCP) in 1 week. Followup with recommendations from psychiatry and social worker after discharge.  Discharge Diagnoses:  Principal Problem:  *Hypokalemia Active Problems:  Alcohol dependence  Substance-induced disorder  Cocaine abuse  Depression  H/O  DM (diabetes mellitus), type 2, uncontrolled with complications  Hypothyroidism  Leukocytosis  Hyponatremia  Discharge Condition: Stable  Diet recommendation: Diabetic diet.  Filed Weights   03/08/12 1035  Weight: 138.347 kg (305 lb)    History of present illness:  Phillip Turner is a 51 y.o. African American male with history of hypertension, diabetes, hypothyroidism, anxiety, depression, history of CVA, alcohol use, and cocaine use who presents with depression and suicidal thoughts.  Hospital Course:  Hypokalemia  Likely due to diuretics and noncompliance with his supplemental potassium. Resolved with replacement. Restart diuretics today with home oral potassium. No events on telemetry. EKG shows normal sinus rhythm. Magnesium normal.   Polysubstance abuse  Patient counseled on cessation with alcohol and cocaine. Social work and psychiatry consultation appriciated. Urine drug screen positive for cocaine.  Alcohol abuse  Again counseled on cessation. On CIWA protocol. Continue Thiamine and folic acid.   Depression with suicidal ideation  Continue home psychiatric medications. Psychiatry recommended [facility].   Diabetes type 2 uncontrolled with complications  Continue metformin. Continue glyburide which was added to his regimen. Diabetic diet. Hemoglobin A1c 8.0 on 03/08/2012 suggesting an average blood glucose of 183. Further titration of diabetic medications as  outpatient.  Hypertension  Continue home antihypertensive medications. No beta blockers given recent cocaine use. Stable.  Hyponatremia  Likely due to uncontrolled diabetes and diuretics. Stable.  Chronic lower extremity edema  Continue home furosemide and metolazone.   Leukocytosis  Likely reactive. Patient does not endorsing any dysuria or cough, do not see the need for further evaluation of this time.   Patient given scripts for all medications for atleast 10 days.  Hypothyroidism  Continue levothyroxine.   Procedures:  None  Consultations:  Psychiatry  Discharge Exam: Filed Vitals:   03/08/12 1035 03/08/12 1403 03/08/12 2134 03/09/12 0524  BP: 168/111 141/77 117/74 138/84  Pulse: 90 88 89 59  Temp: 98 F (36.7 C) 98.4 F (36.9 C) 98.4 F (36.9 C) 98.6 F (37 C)  TempSrc: Oral Oral Oral Oral  Resp: 20 20 18 20   Height: 5\' 10"  (1.778 m)     Weight: 138.347 kg (305 lb)     SpO2: 94% 91% 92% 95%   Discharge Instructions  Discharge Orders    Future Orders Please Complete By Expires   Diet Carb Modified      Increase activity slowly      Discharge instructions      Comments:   Followup with Dorrene German, MD (PCP) in 1 week. Followup with recommendation from psychiatry and social worker after discharge.       Medication List     As of 03/09/2012 11:42 AM    TAKE these medications         albuterol (2.5 MG/3ML) 0.083% nebulizer solution   Commonly known as: PROVENTIL   Take 3 mLs (2.5 mg total) by nebulization every 6 (six) hours as needed for wheezing.      aspirin 325 MG tablet   Take 325 mg by mouth daily.      colchicine 0.6 MG tablet  Take 1 tablet (0.6 mg total) by mouth daily. Gout Flare      escitalopram 10 MG tablet   Commonly known as: LEXAPRO   Take 1 tablet (10 mg total) by mouth daily.      febuxostat 40 MG tablet   Commonly known as: ULORIC   Take 1 tablet (40 mg total) by mouth daily.      folic acid 1 MG tablet   Commonly  known as: FOLVITE   Take 1 tablet (1 mg total) by mouth daily.      furosemide 20 MG tablet   Commonly known as: LASIX   Take 1 tablet (20 mg total) by mouth every other day.      glyBURIDE 2.5 MG tablet   Commonly known as: DIABETA   Take 1 tablet (2.5 mg total) by mouth 2 (two) times daily with a meal.      levothyroxine 200 MCG tablet   Commonly known as: SYNTHROID, LEVOTHROID   Take 1 tablet (200 mcg total) by mouth daily.      lisinopril 10 MG tablet   Commonly known as: PRINIVIL,ZESTRIL   Take 1 tablet (10 mg total) by mouth daily.      metFORMIN 500 MG tablet   Commonly known as: GLUCOPHAGE   Take 2 tablets (1,000 mg total) by mouth daily with breakfast.      metolazone 5 MG tablet   Commonly known as: ZAROXOLYN   Take 1 tablet (5 mg total) by mouth every other day. Alternates with furosemide      potassium chloride 20 MEQ packet   Commonly known as: KLOR-CON   Take 40 mEq by mouth 2 (two) times daily.      simvastatin 20 MG tablet   Commonly known as: ZOCOR   Take 1 tablet (20 mg total) by mouth daily.      thiamine 100 MG tablet   Take 1 tablet (100 mg total) by mouth daily.           Follow-up Information    Follow up with AVBUERE,EDWIN A, MD. Schedule an appointment as soon as possible for a visit in 1 week.   Contact information:   [address] [place] [address] [phone]       Follow up with Psychiatry. Schedule an appointment as soon as possible for a visit in 1 week.          The results of significant diagnostics from this hospitalization (including imaging, microbiology, ancillary and laboratory) are listed below for reference.    Significant Diagnostic Studies: No results found.  Microbiology: No results found for this or any previous visit (from the past 240 hour(s)).   Labs: Basic Metabolic Panel:  Lab 03/09/12 9147 03/08/12 1540 03/08/12 0730  NA 132* 133* 130*  K 3.5 2.7* 2.4*  CL 98 92* 88*  CO2 24 26 23    GLUCOSE 204* 153* 147*  BUN 19 16 13   CREATININE 1.54* 1.55* 1.32  CALCIUM 9.0 9.0 9.7  MG 2.3 -- 2.3  PHOS -- -- --   Liver Function Tests:  Lab 03/08/12 0730  AST 51*  ALT 71*  ALKPHOS 87  BILITOT 1.0  PROT 7.7  ALBUMIN 4.1   No results found for this basename: LIPASE:5,AMYLASE:5 in the last 168 hours No results found for this basename: AMMONIA:5 in the last 168 hours CBC:  Lab 03/08/12 0730  WBC 12.9*  NEUTROABS --  HGB 14.9  HCT 40.5  MCV 74.0*  PLT 345  Cardiac Enzymes: No results found for this basename: CKTOTAL:5,CKMB:5,CKMBINDEX:5,TROPONINI:5 in the last 168 hours BNP: BNP (last 3 results) No results found for this basename: PROBNP:3 in the last 8760 hours CBG:  Lab 03/08/12 2131 03/08/12 1618 03/08/12 1100  GLUCAP 150* 157* 185*    Time spent: 25 minutes   Signed:  Veleta Yamamoto A  [facility] 03/09/2012, 11:42 AM

## 2012-03-09 NOTE — Progress Notes (Signed)
Per MD, Pt medically ready for d/c.  Spoke with [facility].  Informed that [facility] cannot provide detox services due to [employer]. They can provide residential, once Pt has been detoxed.  Spoke with MD re: Pt receiving detox at [facility] and [facility] staff assisting Pt with residential placement for SA.  MD in agreement, however MD will defer to psych MD for d/c recommendations.  Psych MD to see Pt today.  CSW to continue to follow.  Providence Crosby, LCSWA Clinical Social Work [phone]

## 2012-03-09 NOTE — Progress Notes (Signed)
Conferred with psych MD.  Psych MD recommending inpt detox.  Notified [facility].  CSW to continue to follow.  Providence Crosby, LCSWA Clinical Social Work [phone]

## 2012-03-09 NOTE — Progress Notes (Signed)
Progress Note s/p consultation;  Patient Identification:  Phillip Turner Date of Evaluation:  03/09/2012 Reason for Consult:  Referring Provider:   History of Present Illness:Pt presents with recent alcohol abuse, and declares suicidal ideation.  Discussed with Dr. Betti Cruz.  Pt is suicidal and says he has been drinking daily.  He expresses more interest in detox and rehab today.     Past Psychiatric History:Relapse with alcohol use and recurrent depression associated with substance abuse  History of self-medicating with substances.   Past Medical History:     Past Medical History  Diagnosis Date  . Diabetes mellitus   . Hypertension   . Stroke   . Hypothyroidism   . Anxiety   . Depression        Past Surgical History  Procedure Date  . Tonsillectomy     Allergies:  Allergies  Allergen Reactions  . Shellfish Allergy Anaphylaxis and Nausea And Vomiting    Current Medications:  Prior to Admission medications   Medication Sig Start Date End Date Taking? Authorizing Provider  albuterol (PROVENTIL) (2.5 MG/3ML) 0.083% nebulizer solution Take 3 mLs (2.5 mg total) by nebulization every 6 (six) hours as needed for wheezing. 12/11/11 12/10/12 Yes Loren Racer, MD  aspirin 325 MG tablet Take 325 mg by mouth daily.     Yes Historical Provider, MD  colchicine 0.6 MG tablet Take 1 tablet (0.6 mg total) by mouth daily. Gout Flare 03/09/12   Cristal Ford, MD  escitalopram (LEXAPRO) 10 MG tablet Take 1 tablet (10 mg total) by mouth daily. 03/09/12   Srikar Cherlynn Kaiser, MD  febuxostat (ULORIC) 40 MG tablet Take 1 tablet (40 mg total) by mouth daily. 03/09/12   Cristal Ford, MD  folic acid (FOLVITE) 1 MG tablet Take 1 tablet (1 mg total) by mouth daily. 03/09/12   Srikar Cherlynn Kaiser, MD  furosemide (LASIX) 20 MG tablet Take 1 tablet (20 mg total) by mouth every other day. 03/09/12   Cristal Ford, MD  glyBURIDE (DIABETA) 2.5 MG tablet Take 1 tablet (2.5 mg total) by mouth 2 (two) times daily with a  meal. 03/09/12   Cristal Ford, MD  levothyroxine (SYNTHROID, LEVOTHROID) 200 MCG tablet Take 1 tablet (200 mcg total) by mouth daily. 03/09/12   Srikar Cherlynn Kaiser, MD  lisinopril (PRINIVIL,ZESTRIL) 10 MG tablet Take 1 tablet (10 mg total) by mouth daily. 03/09/12   Cristal Ford, MD  metFORMIN (GLUCOPHAGE) 500 MG tablet Take 2 tablets (1,000 mg total) by mouth daily with breakfast. 03/09/12   Cristal Ford, MD  metolazone (ZAROXOLYN) 5 MG tablet Take 1 tablet (5 mg total) by mouth every other day. Alternates with furosemide 03/09/12   Cristal Ford, MD  potassium chloride (KLOR-CON) 20 MEQ packet Take 40 mEq by mouth 2 (two) times daily. 03/09/12 03/09/13  Cristal Ford, MD  simvastatin (ZOCOR) 20 MG tablet Take 1 tablet (20 mg total) by mouth daily. 03/09/12   Cristal Ford, MD  thiamine 100 MG tablet Take 1 tablet (100 mg total) by mouth daily. 03/09/12   Cristal Ford, MD    Social History:    reports that he has quit smoking. He has never used smokeless tobacco. He reports that he drinks about 16.8 ounces of alcohol per week. He reports that he does not use illicit drugs.   Family History:    Family History  Problem Relation Age of Onset  . Coronary artery disease Mother   . Diabetes  type II Father   . Hypertension Father    DIAGNOSIS:   AXIS I Alcool Dependence, Depression, suicidal ideation   AXIS II  Deffered  AXIS III See medical notes.  AXIS IV other psychosocial or environmental problems, problems related to social environment, problems with primary support group and continued alcohol use  AXIS V 51-60 moderate symptoms   Assessment/Plan:  Pt is stable ready for discharge,  He is suicidal and wants to stop drinking alcohol; using drugs.  RECOMMENDATION:  1. Suggest transfer to [facility]  Or like facility for rehab and then transfer to out patient continuity of ltreatment.  2.  No further suggestions.  MD Psychiatrist signs off.  Jaleiah Asay J. Ferol Luz, MD Psychiatrist  03/09/2012  1:35  PM

## 2012-03-09 NOTE — Progress Notes (Signed)
Subjective: No specific concern. Denies any chest pain or shortness of breath.  Objective: Vital signs in last 24 hours: Filed Vitals:   03/08/12 1035 03/08/12 1403 03/08/12 2134 03/09/12 0524  BP: 168/111 141/77 117/74 138/84  Pulse: 90 88 89 59  Temp: 98 F (36.7 C) 98.4 F (36.9 C) 98.4 F (36.9 C) 98.6 F (37 C)  TempSrc: Oral Oral Oral Oral  Resp: 20 20 18 20   Height: 5\' 10"  (1.778 m)     Weight: 138.347 kg (305 lb)     SpO2: 94% 91% 92% 95%   Weight change:   Intake/Output Summary (Last 24 hours) at 03/09/12 0853 Last data filed at 03/08/12 2000  Gross per 24 hour  Intake   1880 ml  Output      0 ml  Net   1880 ml    Physical Exam: General: Awake, Oriented, No acute distress. HEENT: EOMI. Neck: Supple CV: S1 and S2 Lungs: Clear to ascultation bilaterally Abdomen: Soft, Nontender, Nondistended, +bowel sounds. Ext: Good pulses. 1+ LE edema.  Lab Results: Basic Metabolic Panel:  Lab 03/09/12 2952 03/08/12 1540 03/08/12 0730  NA 132* 133* 130*  K 3.5 2.7* 2.4*  CL 98 92* 88*  CO2 24 26 23   GLUCOSE 204* 153* 147*  BUN 19 16 13   CREATININE 1.54* 1.55* 1.32  CALCIUM 9.0 9.0 9.7  MG 2.3 -- 2.3  PHOS -- -- --   Liver Function Tests:  Lab 03/08/12 0730  AST 51*  ALT 71*  ALKPHOS 87  BILITOT 1.0  PROT 7.7  ALBUMIN 4.1   No results found for this basename: LIPASE:5,AMYLASE:5 in the last 168 hours No results found for this basename: AMMONIA:5 in the last 168 hours CBC:  Lab 03/08/12 0730  WBC 12.9*  NEUTROABS --  HGB 14.9  HCT 40.5  MCV 74.0*  PLT 345   Cardiac Enzymes: No results found for this basename: CKTOTAL:5,CKMB:5,CKMBINDEX:5,TROPONINI:5 in the last 168 hours BNP (last 3 results) No results found for this basename: PROBNP:3 in the last 8760 hours CBG:  Lab 03/08/12 2131 03/08/12 1618 03/08/12 1100  GLUCAP 150* 157* 185*    Basename 03/08/12 0730  HGBA1C 8.0*   Other Labs: No components found with this basename: POCBNP:3 No  results found for this basename: DDIMER:2 in the last 168 hours No results found for this basename: CHOL:2,HDL:2,LDLCALC:2,TRIG:2,CHOLHDL:2,LDLDIRECT:2 in the last 168 hours No results found for this basename: TSH,T4TOTAL,FREET3,T3FREE,FREET4,THYROIDAB in the last 168 hours No results found for this basename: VITAMINB12:2,FOLATE:2,FERRITIN:2,TIBC:2,IRON:2,RETICCTPCT:2 in the last 168 hours  Micro Results: No results found for this or any previous visit (from the past 240 hour(s)).  Studies/Results: No results found.  Medications: I have reviewed the patient's current medications. Scheduled Meds:   . aspirin  325 mg Oral Daily  . colchicine  0.6 mg Oral Daily  . enoxaparin (LOVENOX) injection  40 mg Subcutaneous Q24H  . escitalopram  5 mg Oral Daily  . febuxostat  40 mg Oral Daily  . folic acid  1 mg Oral Daily  . furosemide  20 mg Oral QODAY  . glyBURIDE  2.5 mg Oral BID WC  . influenza  inactive virus vaccine  0.5 mL Intramuscular Tomorrow-1000  . insulin aspart  0-9 Units Subcutaneous TID WC  . levothyroxine  200 mcg Oral QAC breakfast  . lisinopril  10 mg Oral Daily  . metFORMIN  1,000 mg Oral Q breakfast  . metolazone  5 mg Oral QODAY  . multivitamin with minerals  1  tablet Oral Daily  . potassium chloride  10 mEq Intravenous Q1 Hr x 4  . [COMPLETED] potassium chloride  10 mEq Intravenous Q1 Hr x 4  . [COMPLETED] potassium chloride  40 mEq Oral Once  . potassium chloride  40 mEq Oral BID  . simvastatin  20 mg Oral q1800  . thiamine  100 mg Oral Daily   Or  . thiamine  100 mg Intravenous Daily  . [COMPLETED] sodium chloride   Intravenous STAT  . [DISCONTINUED] levothyroxine  200 mcg Oral Daily  . [DISCONTINUED] potassium chloride  40 mEq Oral BID   Continuous Infusions:  PRN Meds:.acetaminophen, acetaminophen, albuterol, LORazepam, LORazepam, ondansetron (ZOFRAN) IV, ondansetron, polyethylene glycol  Assessment/Plan: Hypokalemia  Likely due to diuretics and  noncompliance with his supplemental potassium. Resolved with replacement. Restart diuretics today with home oral potassium. No events on telemetry, discontinue telemetery, EKG shows normal sinus rhythm. Magnesium normal.   Polysubstance abuse  Patient counseled on cessation with alcohol and cocaine. Social work and psychiatry consultation appriciated. Urine drug screen positive for cocaine.  Alcohol abuse  Again counseled on cessation. On CIWA protocol. Continue Thiamine and folic acid.   Depression with suicidal ideation  Continue home psychiatric medications. Psychiatry consulted for further input. Continue Recruitment consultant, defer to psychiatry if they would like to discontinue sitter.   Diabetes type 2 uncontrolled with complications  Continue metformin. Continue glyburide which was added to his regimen. Sliding scale insulin. Diabetic diet. Hemoglobin A1c 8.0 on 03/08/2012 suggesting an average blood glucose of 183.  Hypertension  Continue home antihypertensive medications. No beta blockers given recent cocaine use. Stable.  Hyponatremia  Likely due to uncontrolled diabetes and diuretics. Stable.  Chronic lower extremity edema  Continue home furosemide and metolazone.   Leukocytosis  Likely reactive. Patient does not endorsing any dysuria or cough, do not see the need for further evaluation of this time.   Hypothyroidism  Continue levothyroxine.   Prophylaxis  Lovenox.   CODE STATUS  Full code, given patient's depression and suicidal thoughts.   Disposition  Patient is medially stable for discharge, pending plans to be finalized by psychiatry and social work.    LOS: 1 day  Avamarie Crossley A, MD 03/09/2012, 8:53 AM

## 2012-03-09 NOTE — Progress Notes (Addendum)
Per Tanna Savoy at [facility], Pt has been accepted.  A bed should be ready soon.  Notified Pt, Care Coordinator and MD.  Pt signed consent.  Faxed consent to [facility].  Awaiting bed assignment.  CSW to continue to follow.  Providence Crosby, LCSWA Clinical Social Work [phone]

## 2012-03-10 ENCOUNTER — Encounter (HOSPITAL_COMMUNITY): Payer: Self-pay | Admitting: Psychiatry

## 2012-03-10 DIAGNOSIS — F411 Generalized anxiety disorder: Secondary | ICD-10-CM

## 2012-03-10 DIAGNOSIS — F332 Major depressive disorder, recurrent severe without psychotic features: Principal | ICD-10-CM

## 2012-03-10 DIAGNOSIS — F339 Major depressive disorder, recurrent, unspecified: Secondary | ICD-10-CM | POA: Diagnosis present

## 2012-03-10 DIAGNOSIS — F1410 Cocaine abuse, uncomplicated: Secondary | ICD-10-CM

## 2012-03-10 DIAGNOSIS — F1020 Alcohol dependence, uncomplicated: Secondary | ICD-10-CM

## 2012-03-10 DIAGNOSIS — F1010 Alcohol abuse, uncomplicated: Secondary | ICD-10-CM | POA: Diagnosis present

## 2012-03-10 DIAGNOSIS — F419 Anxiety disorder, unspecified: Secondary | ICD-10-CM | POA: Diagnosis present

## 2012-03-10 LAB — GLUCOSE, CAPILLARY
Glucose-Capillary: 150 mg/dL — ABNORMAL HIGH (ref 70–99)
Glucose-Capillary: 172 mg/dL — ABNORMAL HIGH (ref 70–99)
Glucose-Capillary: 169 mg/dL — ABNORMAL HIGH (ref 70–99)

## 2012-03-10 MED ORDER — METOLAZONE 5 MG PO TABS
5.0000 mg | ORAL_TABLET | ORAL | Status: DC
  Administered 2012-03-10: 5 mg via ORAL
  Filled 2012-03-10 (×2): qty 1

## 2012-03-10 MED ORDER — VITAMIN B-1 100 MG PO TABS: 100.0000 mg | ORAL_TABLET | Freq: Every day | ORAL | Status: DC

## 2012-03-10 MED ORDER — POTASSIUM CHLORIDE 20 MEQ PO PACK
40.0000 meq | PACK | Freq: Two times a day (BID) | ORAL | Status: DC
  Administered 2012-03-10 – 2012-03-12 (×4): 40 meq via ORAL
  Filled 2012-03-10 (×6): qty 2

## 2012-03-10 MED ORDER — FEBUXOSTAT 40 MG PO TABS
40.0000 mg | ORAL_TABLET | Freq: Every day | ORAL | Status: DC
  Administered 2012-03-11 – 2012-03-17 (×7): 40 mg via ORAL
  Filled 2012-03-10 (×9): qty 1

## 2012-03-10 MED ORDER — LEVOTHYROXINE SODIUM 200 MCG PO TABS: 200.0000 ug | ORAL_TABLET | Freq: Every day | ORAL | Status: DC

## 2012-03-10 MED ORDER — GLYBURIDE 2.5 MG PO TABS
2.5000 mg | ORAL_TABLET | Freq: Two times a day (BID) | ORAL | Status: DC
  Administered 2012-03-10 – 2012-03-17 (×13): 2.5 mg via ORAL
  Filled 2012-03-10 (×18): qty 1

## 2012-03-10 MED ORDER — METFORMIN HCL 500 MG PO TABS: 1000.0000 mg | ORAL_TABLET | Freq: Every day | ORAL | Status: DC

## 2012-03-10 MED ORDER — LISINOPRIL 10 MG PO TABS: 10.0000 mg | ORAL_TABLET | Freq: Every day | ORAL | Status: DC

## 2012-03-10 MED ORDER — METOLAZONE 5 MG PO TABS: 5.0000 mg | ORAL_TABLET | ORAL | Status: DC

## 2012-03-10 MED ORDER — NAPROXEN 500 MG PO TABS
500.0000 mg | ORAL_TABLET | Freq: Two times a day (BID) | ORAL | Status: DC
  Administered 2012-03-10 – 2012-03-17 (×14): 500 mg via ORAL
  Filled 2012-03-10 (×19): qty 1

## 2012-03-10 MED ORDER — NAPROXEN 500 MG PO TABS
500.0000 mg | ORAL_TABLET | Freq: Two times a day (BID) | ORAL | Status: DC
  Filled 2012-03-10: qty 1

## 2012-03-10 MED ORDER — COLCHICINE 0.6 MG PO TABS
0.6000 mg | ORAL_TABLET | Freq: Every day | ORAL | Status: DC
  Administered 2012-03-11 – 2012-03-17 (×7): 0.6 mg via ORAL
  Filled 2012-03-10 (×10): qty 1

## 2012-03-10 MED ORDER — FLUOXETINE HCL 20 MG PO CAPS
20.0000 mg | ORAL_CAPSULE | Freq: Every day | ORAL | Status: DC
  Administered 2012-03-11 – 2012-03-12 (×2): 20 mg via ORAL
  Filled 2012-03-10 (×3): qty 1

## 2012-03-10 MED ORDER — FUROSEMIDE 20 MG PO TABS
20.0000 mg | ORAL_TABLET | ORAL | Status: DC
  Administered 2012-03-11: 20 mg via ORAL
  Filled 2012-03-10 (×2): qty 1

## 2012-03-10 MED ORDER — QUETIAPINE FUMARATE 25 MG PO TABS
25.0000 mg | ORAL_TABLET | Freq: Every day | ORAL | Status: DC
  Administered 2012-03-10 – 2012-03-11 (×2): 25 mg via ORAL
  Filled 2012-03-10 (×5): qty 1

## 2012-03-10 MED ORDER — ESCITALOPRAM OXALATE 10 MG PO TABS: 10.0000 mg | ORAL_TABLET | Freq: Every day | ORAL | Status: DC

## 2012-03-10 MED ORDER — ASPIRIN 325 MG PO TABS
325.0000 mg | ORAL_TABLET | Freq: Every day | ORAL | Status: DC
  Filled 2012-03-10: qty 1

## 2012-03-10 MED ORDER — FOLIC ACID 1 MG PO TABS
1.0000 mg | ORAL_TABLET | Freq: Every day | ORAL | Status: DC
  Administered 2012-03-11 – 2012-03-17 (×7): 1 mg via ORAL
  Filled 2012-03-10 (×9): qty 1

## 2012-03-10 MED ORDER — SIMVASTATIN 20 MG PO TABS
20.0000 mg | ORAL_TABLET | Freq: Every day | ORAL | Status: DC
  Administered 2012-03-10 – 2012-03-17 (×8): 20 mg via ORAL
  Filled 2012-03-10 (×11): qty 1
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
## 2012-03-10 NOTE — Social Work (Signed)
Aftercare Planning Group: 03/10/2012 9:45 AM  Pt attended discharge planning group and actively participated in group.  CSW provided pt with today's workbook.  Pt presents with flat affect and depressed mood  Pt rates depression and anxiety at an 8-9.  Pt states that he came to the hospital due to depression, SI and drug use.  Pt states that he feels guilty for making bad decisions in his life.  Pt states that he lives in [place] with his wife and kids and has access to transportation.  Pt states that he is interested in going to [facility]. CSW provided pt with info on [facility] and [facility] and will make appropriate referral.  No further needs voiced by pt at this time.  Safety planning and suicide prevention discussed.  Pt participated in discussion and acknowledged an understanding of the information provided.       [facility] Group Note : Clinical Social Worker Group Therapy  03/10/2012  1:15 PM  Type of Therapy:  Group Therapy  Participation Level:  Appropriate  Participation Quality:  Appropriate   Affect:  Appropriate  Cognitive:  Alert  Insight:  Good  Engagement in Group:  Good  Engagement in Therapy:  Good  Modes of Intervention:  Clarification, Education, Socialization and Support  Summary of Progress/Problems: Patient was attentive and engaged with speaker from [facility] [employer] [facility].  Patient expressed interest in their programs and services.  Patient processed ways they can relate to the speaker.      Tavia Stave Horton, LCSWA 03/10/2012 3:00 pm

## 2012-03-10 NOTE — Progress Notes (Signed)
Patient ID: Phillip Turner, male   DOB: June 28, 1960, 51 y.o.   MRN: [id number] Pt was pleasant and cooperative, but flat and depressed during the assessment.  Pt stated he's depressed because he's not doing enough for his family. States he's hopeless and uses alcohol to suppress and minimize.
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
## 2012-03-10 NOTE — Progress Notes (Signed)
Patient ID: Phillip Turner, male   DOB: 20-Jan-1961, 51 y.o.   MRN: [id number] He has been up and about and to part of the groups.  He c/o and received prn medication for withdrawal  symptoms and it was effective.   Self inventory:  Depression 8 , Hopelessness 7, Denies W/D symptoms this AM , SI on and off, contracts for safety. Marland Kitchen

## 2012-03-10 NOTE — Progress Notes (Signed)
[facility] Group Notes:  (Counselor/Nursing/MHT/Case Management/Adjunct)  03/10/2012 3:15 PM  Type of Therapy:  Psychoeducational Skills  Participation Level:  Active  Participation Quality:  Appropriate  Affect:  Appropriate  Cognitive:  Appropriate  Insight:  Good  Engagement in Group:  Good  Engagement in Therapy:  Good  Modes of Intervention:  Education  Summary of Progress/Problems:Staff explained to the patient that the purpose of this group is to educate them on recovery, and on setting mid-range to long-term personal goals for their recovery process.  Staff also explained that the group will also address topics including what recovery is, who goes through recovery, the first steps toward recovery, and setting realistic goals for recovery. Patient was asked to identify two areas of their life in which they would like to make a change toward recovery, and set a specific measurable goal to address each change identified. Patient was provided with a homework assignment regarding how this goal impacts their personal recovery. Staff concluded the group by encouraging the patient to take a proactive approach to accomplishing their recovery goals.   Ardelle Park O 03/10/2012, 3:15 PM

## 2012-03-11 DIAGNOSIS — F1910 Other psychoactive substance abuse, uncomplicated: Secondary | ICD-10-CM

## 2012-03-11 DIAGNOSIS — F4310 Post-traumatic stress disorder, unspecified: Secondary | ICD-10-CM

## 2012-03-11 DIAGNOSIS — F411 Generalized anxiety disorder: Secondary | ICD-10-CM

## 2012-03-11 LAB — BASIC METABOLIC PANEL
BUN: 12 mg/dL (ref 6–23)
Chloride: 100 mEq/L (ref 96–112)
GFR calc Af Amer: 71 mL/min — ABNORMAL LOW (ref >90)
Potassium: 2.8 mEq/L — ABNORMAL LOW (ref 3.5–5.1)

## 2012-03-11 LAB — GLUCOSE, CAPILLARY
Glucose-Capillary: 138 mg/dL — ABNORMAL HIGH (ref 70–99)
Glucose-Capillary: 141 mg/dL — ABNORMAL HIGH (ref 70–99)

## 2012-03-11 NOTE — Progress Notes (Signed)
Patient ID: Phillip Turner, male   DOB: 1961/01/01, 51 y.o.   MRN: [id number] D- Patient reports his depression and feelings of hopelessness are at level 8. Denies SI/HI/AV hall. Reports  His chronic back pain is 8. " I  didn't sleep at all last night because I snore and didn't want to wake anyone." A- mood is irritable and affect is blunted. Pt has been cooperative with medications. CMG was 140 at 12. Patient was able to give own insulin. Knowledgeable regarding meds but was somewhat sarcastic. " why do you care.." R. Emotional support given. Continue to maintain on 15 minute checks for safety.

## 2012-03-11 NOTE — Progress Notes (Signed)
[facility] MD Progress Note  03/11/2012 12:01 PM Phillip Turner  MRN:  [id number]  Diagnosis:   Axis I: Alcohol Abuse, Generalized Anxiety Disorder, Major Depression, Recurrent severe, Post Traumatic Stress Disorder and Substance Abuse Axis II: Deferred Axis III:  Past Medical History  Diagnosis Date  . Diabetes mellitus   . Hypertension   . Hypothyroidism   . Anxiety   . Depression   . Stroke     does not remember when   Axis IV: other psychosocial or environmental problems, problems related to social environment and problems with primary support group Axis V: 41-50 serious symptoms  ADL's:  Intact  Sleep: Poor  Appetite:  Fair  Suicidal Ideation:  Passive suicidal thoughts Homicidal Ideation:  Passive homicidal thoughts with no intent  Mental Status Examination/Evaluation: Objective:  Appearance: Casual  Eye Contact::  Fair  Speech:  Normal Rate  Volume:  Normal  Mood:  Depressed  Affect:  Depressed  Thought Process:  Intact  Orientation:  Full  Thought Content:  WDL  Suicidal Thoughts:  Passive  Homicidal Thoughts:  Passive  Memory:  Immediate;   Fair Recent;   Fair Remote;   Fair  Judgement:  Impaired  Insight:  Lacking  Psychomotor Activity:  Decreased  Concentration:  Fair  Recall:  Fair  Akathisia:  Yes  Handed:  Right  AIMS (if indicated):     Assets:  Desire for Improvement Housing Intimacy  Sleep:  Number of Hours: 5.5    Vital Signs:Blood pressure 131/87, pulse 74, temperature 98.2 F (36.8 C), temperature source Oral, resp. rate 22, height 5\' 10"  (1.778 m), weight 140.615 kg (310 lb). Current Medications: Current Facility-Administered Medications  Medication Dose Route Frequency Provider Last Rate Last Dose  . acetaminophen (TYLENOL) tablet 650 mg  650 mg Oral Q6H PRN Verne Spurr, PA-C   650 mg at 03/09/12 2237  . albuterol (PROVENTIL) (5 MG/ML) 0.5% nebulizer solution 2.5 mg  2.5 mg Nebulization Q6H PRN Verne Spurr, PA-C      . alum & mag  hydroxide-simeth (MAALOX/MYLANTA) 200-200-20 MG/5ML suspension 30 mL  30 mL Oral Q4H PRN Verne Spurr, PA-C      . aspirin tablet 325 mg  325 mg Oral Daily Verne Spurr, PA-C   325 mg at 03/11/12 0845  . [EXPIRED] chlordiazePOXIDE (LIBRIUM) capsule 25 mg  25 mg Oral QID Verne Spurr, PA-C   25 mg at 03/11/12 0800   Followed by  . chlordiazePOXIDE (LIBRIUM) capsule 25 mg  25 mg Oral TID Verne Spurr, PA-C       Followed by  . chlordiazePOXIDE (LIBRIUM) capsule 25 mg  25 mg Oral BH-qamhs Verne Spurr, PA-C       Followed by  . chlordiazePOXIDE (LIBRIUM) capsule 25 mg  25 mg Oral Daily Verne Spurr, PA-C      . colchicine tablet 0.6 mg  0.6 mg Oral Daily Rachael Fee, MD   0.6 mg at 03/11/12 0846  . febuxostat (ULORIC) tablet 40 mg  40 mg Oral Daily Rachael Fee, MD   40 mg at 03/11/12 0849  . FLUoxetine (PROZAC) capsule 20 mg  20 mg Oral Daily Rachael Fee, MD   20 mg at 03/11/12 0849  . folic acid (FOLVITE) tablet 1 mg  1 mg Oral Daily Rachael Fee, MD   1 mg at 03/11/12 0846  . furosemide (LASIX) tablet 20 mg  20 mg Oral QODAY Rachael Fee, MD   20 mg at 03/11/12 0947  .  glyBURIDE (DIABETA) tablet 2.5 mg  2.5 mg Oral BID WC Rachael Fee, MD   2.5 mg at 03/11/12 0847  . hydrOXYzine (ATARAX/VISTARIL) tablet 25 mg  25 mg Oral Q6H PRN Verne Spurr, PA-C      . [EXPIRED] influenza  inactive virus vaccine (FLUZONE/FLUARIX) injection 0.5 mL  0.5 mL Intramuscular Tomorrow-1000 Verne Spurr, PA-C      . insulin aspart (novoLOG) injection 0-9 Units  0-9 Units Subcutaneous TID WC Verne Spurr, PA-C   1 Units at 03/11/12 [phone]  . levothyroxine (SYNTHROID, LEVOTHROID) tablet 200 mcg  200 mcg Oral QAC breakfast Verne Spurr, PA-C   200 mcg at 03/11/12 0636  . lisinopril (PRINIVIL,ZESTRIL) tablet 10 mg  10 mg Oral Daily Verne Spurr, PA-C   10 mg at 03/11/12 0845  . loperamide (IMODIUM) capsule 2-4 mg  2-4 mg Oral PRN Verne Spurr, PA-C      . magnesium hydroxide (MILK OF MAGNESIA) suspension  30 mL  30 mL Oral Daily PRN Verne Spurr, PA-C      . metolazone (ZAROXOLYN) tablet 5 mg  5 mg Oral QODAY Rachael Fee, MD   5 mg at 03/10/12 1213  . multivitamin with minerals tablet 1 tablet  1 tablet Oral Daily Verne Spurr, PA-C   1 tablet at 03/11/12 0846  . naproxen (NAPROSYN) tablet 500 mg  500 mg Oral BID WC Verne Spurr, PA-C   500 mg at 03/11/12 0845  . ondansetron (ZOFRAN-ODT) disintegrating tablet 4 mg  4 mg Oral Q6H PRN Verne Spurr, PA-C      . polyethylene glycol (MIRALAX / GLYCOLAX) packet 17 g  17 g Oral Daily PRN Verne Spurr, PA-C      . potassium chloride (KLOR-CON) packet 40 mEq  40 mEq Oral BID Rachael Fee, MD   40 mEq at 03/11/12 0849  . QUEtiapine (SEROQUEL) tablet 25 mg  25 mg Oral QHS Rachael Fee, MD   25 mg at 03/10/12 2148  . simvastatin (ZOCOR) tablet 20 mg  20 mg Oral Daily Rachael Fee, MD   20 mg at 03/11/12 0847  . thiamine (VITAMIN B-1) tablet 100 mg  100 mg Oral Daily Verne Spurr, PA-C   100 mg at 03/11/12 0848  . [DISCONTINUED] metolazone (ZAROXOLYN) tablet 5 mg  5 mg Oral QODAY Verne Spurr, PA-C      . [DISCONTINUED] naproxen (NAPROSYN) tablet 500 mg  500 mg Oral BID WC Verne Spurr, PA-C      . [DISCONTINUED] potassium chloride SA (K-DUR,KLOR-CON) CR tablet 40 mEq  40 mEq Oral BID Verne Spurr, PA-C   40 mEq at 03/10/12 9811    Lab Results:  Results for orders placed during the hospital encounter of 03/09/12 (from the past 48 hour(s))  GLUCOSE, CAPILLARY     Status: Abnormal   Collection Time   03/09/12  7:39 PM      Component Value Range Comment   Glucose-Capillary 176 (*) 70 - 99 mg/dL    Comment 1 Notify RN     GLUCOSE, CAPILLARY     Status: Abnormal   Collection Time   03/10/12  5:55 AM      Component Value Range Comment   Glucose-Capillary 148 (*) 70 - 99 mg/dL   GLUCOSE, CAPILLARY     Status: Abnormal   Collection Time   03/10/12 12:08 PM      Component Value Range Comment   Glucose-Capillary 150 (*) 70 - 99 mg/dL   GLUCOSE,  CAPILLARY  Status: Abnormal   Collection Time   03/10/12  4:55 PM      Component Value Range Comment   Glucose-Capillary 172 (*) 70 - 99 mg/dL   GLUCOSE, CAPILLARY     Status: Abnormal   Collection Time   03/10/12  9:37 PM      Component Value Range Comment   Glucose-Capillary 169 (*) 70 - 99 mg/dL    Comment 1 Notify RN     GLUCOSE, CAPILLARY     Status: Abnormal   Collection Time   03/11/12  6:01 AM      Component Value Range Comment   Glucose-Capillary 138 (*) 70 - 99 mg/dL   BASIC METABOLIC PANEL     Status: Abnormal   Collection Time   03/11/12  6:30 AM      Component Value Range Comment   Sodium 138  135 - 145 mEq/L    Potassium 2.8 (*) 3.5 - 5.1 mEq/L    Chloride 100  96 - 112 mEq/L    CO2 26  19 - 32 mEq/L    Glucose, Bld 158 (*) 70 - 99 mg/dL    BUN 12  6 - 23 mg/dL    Creatinine, Ser 1.61  0.50 - 1.35 mg/dL    Calcium 8.9  8.4 - 09.6 mg/dL    GFR calc non Af Amer 62 (*) >90 mL/min    GFR calc Af Amer 71 (*) >90 mL/min     Physical Findings: AIMS: Facial and Oral Movements Muscles of Facial Expression: None, normal Lips and Perioral Area: None, normal Jaw: None, normal Tongue: None, normal,Extremity Movements Upper (arms, wrists, hands, fingers): None, normal Lower (legs, knees, ankles, toes): None, normal, Trunk Movements Neck, shoulders, hips: None, normal, Overall Severity Severity of abnormal movements (highest score from questions above): None, normal Incapacitation due to abnormal movements: None, normal Patient's awareness of abnormal movements (rate only patient's report): No Awareness, Dental Status Current problems with teeth and/or dentures?: No Does patient usually wear dentures?: No  CIWA:  CIWA-Ar Total: 0  COWS:     Treatment Plan Summary: Daily contact with patient to assess and evaluate symptoms and progress in treatment Medication management  Plan:  Individual and group therapy, medication management for detox and depression, patient is  not happy with current medication regiment--encouraged to continue until titrations and adjustments could be made to best meet his needs.  Nanine Means, NP 03/11/2012, 12:01 PM

## 2012-03-11 NOTE — Progress Notes (Signed)
Pt laying in bed resting with eyes closed. Respirations even and unlabored. No distress noted.  

## 2012-03-11 NOTE — Social Work (Signed)
Aftercare Planning Group: 03/11/2012 9:45 AM  Pt attended discharge planning group and actively participated in group.  CSW provided pt with today's workbook.  Pt presents with calm mood and affect.  Pt rates depression and anxiety at a 9 today.  Pt denies SI/HI.  Pt still wants to go to [facility] for further treatment.  Pt discussed being afraid to go to sleep because this is when he has bad thoughts.  CSW will call for [facility] bed availability daily.  No further needs voiced by pt at this time.    [facility] Group Note : Clinical Social Worker Group Therapy  03/11/2012  1:15 PM  Type of Therapy:  Group Therapy  Participation Level:  Did Not Attend  Reyes Ivan, LCSWA 03/11/2012 3:00 pm

## 2012-03-11 NOTE — Tx Team (Signed)
Interdisciplinary Treatment Plan Update (Adult)  Date:  03/11/2012  Time Reviewed:  9:55 AM   Progress in Treatment: Attending groups: Yes Participating in groups:  Yes Taking medication as prescribed: Yes Tolerating medication:  Yes Family/Significant othe contact made:  CSW assessing for appropriate contact Patient understands diagnosis:  Yes Discussing patient identified problems/goals with staff:  Yes Medical problems stabilized or resolved:  Yes Denies suicidal/homicidal ideation: Yes Issues/concerns per patient self-inventory:  None identified Other: N/A  New problem(s) identified: None Identified  Reason for Continuation of Hospitalization: Anxiety Depression Medication stabilization Withdrawal symptoms  Interventions implemented related to continuation of hospitalization: mood stabilization, medication monitoring and adjustment, group therapy and psycho education, safety checks q 15 mins  Additional comments: N/A  Estimated length of stay: 3-5 days  Discharge Plan: CSW is assessing for appropriate referrals.    New goal(s): N/A  Review of initial/current patient goals per problem list:    1.  Goal(s): Address substance use  Met:  No  Target date: by discharge  As evidenced by: completing detox protocol and refer to appropriate treatment  2.  Goal (s): Reduce depressive and anxiety symptoms  Met:  No  Target date: by discharge  As evidenced by: Reducing depression from a 10 to a 3 as reported by pt.    3.  Goal(s): Eliminate SI  Met:  No  Target date: by discharge  As evidenced by: pt denying SI   Attendees: Patient:     Family:     Physician: Irving Lugo, MD 03/11/2012 9:55 AM   Nursing: Donna Shimp, RN 03/11/2012 9:55 AM   Clinical Social Worker:  Blair Lundeen Horton, LCSWA 03/11/2012  9:55 AM   Other: Donna Perez, RN 03/11/2012  9:55 AM   Other:  Jamison Lord, NP 03/11/2012 9:58 AM   Other:  Patrice White, RN 03/11/2012 9:59 AM   Other:  Jake  King, Psyc intern 03/11/2012 9:59 AM   Other:      Scribe for Treatment Team:   Shakendra Griffeth Horton 03/11/2012 9:55 AM   

## 2012-03-11 NOTE — Progress Notes (Signed)
Patient ID: Phillip Turner, male   DOB: 07/20/1960, 51 y.o.   MRN: [id number]   D:  Writer observed pt earlier in the shift. He was interacting with his peers, but still had a flat affect. However, when the writer approached the pt and asked if she could speak with him. Pt stated, "Why are you gonna ask me out on a date?"  Writer laughed and redirected the pt. Pt began to focus on his meds. Stated he wanted the med with an "A". Writer informed pt of his scheduled meds, and list of prn's.   A: Writer adm meds and offered support and encouragement. 15 min checks continued for safety.  R:  Pt remains safe.

## 2012-03-12 DIAGNOSIS — E118 Type 2 diabetes mellitus with unspecified complications: Secondary | ICD-10-CM

## 2012-03-12 DIAGNOSIS — I10 Essential (primary) hypertension: Secondary | ICD-10-CM

## 2012-03-12 DIAGNOSIS — E876 Hypokalemia: Secondary | ICD-10-CM

## 2012-03-12 DIAGNOSIS — E1165 Type 2 diabetes mellitus with hyperglycemia: Secondary | ICD-10-CM

## 2012-03-12 DIAGNOSIS — F1010 Alcohol abuse, uncomplicated: Secondary | ICD-10-CM

## 2012-03-12 LAB — BASIC METABOLIC PANEL
BUN: 11 mg/dL (ref 6–23)
CO2: 26 mEq/L (ref 19–32)
Calcium: 9 mg/dL (ref 8.4–10.5)
Creatinine, Ser: 1.94 mg/dL — ABNORMAL HIGH (ref 0.50–1.35)
Glucose, Bld: 113 mg/dL — ABNORMAL HIGH (ref 70–99)

## 2012-03-12 LAB — MAGNESIUM: Magnesium: 1.9 mg/dL (ref 1.5–2.5)

## 2012-03-12 LAB — GLUCOSE, CAPILLARY: Glucose-Capillary: 207 mg/dL — ABNORMAL HIGH (ref 70–99)

## 2012-03-12 MED ORDER — POTASSIUM CHLORIDE CRYS ER 20 MEQ PO TBCR
40.0000 meq | EXTENDED_RELEASE_TABLET | ORAL | Status: AC
  Administered 2012-03-12 (×3): 40 meq via ORAL
  Filled 2012-03-12 (×4): qty 2

## 2012-03-12 MED ORDER — QUETIAPINE FUMARATE 200 MG PO TABS
200.0000 mg | ORAL_TABLET | Freq: Every day | ORAL | Status: DC
  Administered 2012-03-12: 200 mg via ORAL
  Filled 2012-03-12 (×2): qty 1

## 2012-03-12 MED ORDER — GABAPENTIN 300 MG PO CAPS
300.0000 mg | ORAL_CAPSULE | Freq: Three times a day (TID) | ORAL | Status: DC
  Administered 2012-03-12 – 2012-03-14 (×6): 300 mg via ORAL
  Filled 2012-03-12 (×10): qty 1

## 2012-03-12 MED ORDER — FLUOXETINE HCL 20 MG PO CAPS
30.0000 mg | ORAL_CAPSULE | Freq: Every day | ORAL | Status: DC
  Administered 2012-03-13: 30 mg via ORAL
  Filled 2012-03-12 (×2): qty 1

## 2012-03-12 MED ORDER — QUETIAPINE FUMARATE 100 MG PO TABS
100.0000 mg | ORAL_TABLET | Freq: Every day | ORAL | Status: DC
  Filled 2012-03-12: qty 1

## 2012-03-12 NOTE — Consult Note (Signed)
Requesting physician: Dr. Geoffery Lyons, Psychiatry  Primary Care Physician: Dorrene German, MD  Reason for consultation: Hypokalemia    History of Present Illness: Phillip Turner is a pleasant 51 y/o man admitted to [facility] on 11/5 for substance, mainly ETOH detox. He has a h/o recurrent hypokalemia. He has been maintained on lasix and zaroxolyn every other day dosing for lower extremity edema. He admits to being non-compliant with his potassium supplementation. Labs drawn yesterday show a K of 2.8 and we have been asked to see him to assist with repletion. [name] has no complaints at present. Specifically denies CP/SOB, muscle cramps or weakness. He is very pleasant and cooperative to my exam.  Allergies:   Allergies  Allergen Reactions  . Shellfish Allergy Anaphylaxis and Nausea And Vomiting      Past Medical History  Diagnosis Date  . Diabetes mellitus   . Hypertension   . Hypothyroidism   . Anxiety   . Depression   . Stroke     does not remember when    Past Surgical History  Procedure Date  . Tonsillectomy     Scheduled Meds:    . aspirin  325 mg Oral Daily  . [COMPLETED] chlordiazePOXIDE  25 mg Oral TID   Followed by  . chlordiazePOXIDE  25 mg Oral BH-qamhs   Followed by  . chlordiazePOXIDE  25 mg Oral Daily  . colchicine  0.6 mg Oral Daily  . febuxostat  40 mg Oral Daily  . FLUoxetine  30 mg Oral Daily  . folic acid  1 mg Oral Daily  . gabapentin  300 mg Oral TID  . glyBURIDE  2.5 mg Oral BID WC  . insulin aspart  0-9 Units Subcutaneous TID WC  . levothyroxine  200 mcg Oral QAC breakfast  . lisinopril  10 mg Oral Daily  . multivitamin with minerals  1 tablet Oral Daily  . naproxen  500 mg Oral BID WC  . potassium chloride  40 mEq Oral Q4H  . QUEtiapine  200 mg Oral QHS  . simvastatin  20 mg Oral Daily  . thiamine  100 mg Oral Daily  . [DISCONTINUED] FLUoxetine  20 mg Oral Daily  . [DISCONTINUED] furosemide  20 mg Oral QODAY  . [DISCONTINUED] metolazone  5 mg  Oral QODAY  . [DISCONTINUED] potassium chloride  40 mEq Oral BID  . [DISCONTINUED] QUEtiapine  100 mg Oral QHS  . [DISCONTINUED] QUEtiapine  25 mg Oral QHS   Continuous Infusions:  PRN Meds:.acetaminophen, albuterol, alum & mag hydroxide-simeth, hydrOXYzine, loperamide, magnesium hydroxide, ondansetron, polyethylene glycol  Social History:  reports that he has quit smoking. He has never used smokeless tobacco. He reports that he drinks about 16.8 ounces of alcohol per week. He reports that he uses illicit drugs ("Crack" cocaine).  Family History  Problem Relation Age of Onset  . Coronary artery disease Mother   . Diabetes type II Father   . Hypertension Father     Review of Systems:  Constitutional: Denies fever, chills, diaphoresis, appetite change and fatigue.  HEENT: Denies photophobia, eye pain, redness, hearing loss, ear pain, congestion, sore throat, rhinorrhea, sneezing, mouth sores, trouble swallowing, neck pain, neck stiffness and tinnitus.   Respiratory: Denies SOB, DOE, cough, chest tightness,  and wheezing.   Cardiovascular: Denies chest pain, palpitations and leg swelling.  Gastrointestinal: Denies nausea, vomiting, abdominal pain, diarrhea, constipation, blood in stool and abdominal distention.  Genitourinary: Denies dysuria, urgency, frequency, hematuria, flank pain and difficulty urinating.  Musculoskeletal: Denies myalgias,  back pain, joint swelling, arthralgias and gait problem.  Skin: Denies pallor, rash and wound.  Neurological: Denies dizziness, seizures, syncope, weakness, light-headedness, numbness and headaches.  Hematological: Denies adenopathy. Easy bruising, personal or family bleeding history     Physical Exam: Blood pressure 149/95, pulse 72, temperature 97 F (36.1 C), temperature source Oral, resp. rate 20, height 5\' 10"  (1.778 m), weight 140.615 kg (310 lb). Gen: AAOx3, obese, NAD. HEENT: [place]/AT/PERRL/EOMI/moist mucous membranes. Neck: supple, no  JVD, no LAD, no bruits, no goiter. CV: RRR, no M/R/G Lungs: CTA B Abd: obese, soft, NT, ND, +BS, no masses or organomegaly noted. Ext: 1+ pedal edema bilaterally. Neuro: grossly intact and non-focal. I have seen him ambulating down the hallway and his gait and balance appear intact. Skin: no rashes, wounds identified on exam.  Labs on Admission:  Results for orders placed during the hospital encounter of 03/09/12 (from the past 48 hour(s))  GLUCOSE, CAPILLARY     Status: Abnormal   Collection Time   03/10/12  4:55 PM      Component Value Range Comment   Glucose-Capillary 172 (*) 70 - 99 mg/dL   GLUCOSE, CAPILLARY     Status: Abnormal   Collection Time   03/10/12  9:37 PM      Component Value Range Comment   Glucose-Capillary 169 (*) 70 - 99 mg/dL    Comment 1 Notify RN     GLUCOSE, CAPILLARY     Status: Abnormal   Collection Time   03/11/12  6:01 AM      Component Value Range Comment   Glucose-Capillary 138 (*) 70 - 99 mg/dL   BASIC METABOLIC PANEL     Status: Abnormal   Collection Time   03/11/12  6:30 AM      Component Value Range Comment   Sodium 138  135 - 145 mEq/L    Potassium 2.8 (*) 3.5 - 5.1 mEq/L    Chloride 100  96 - 112 mEq/L    CO2 26  19 - 32 mEq/L    Glucose, Bld 158 (*) 70 - 99 mg/dL    BUN 12  6 - 23 mg/dL    Creatinine, Ser 1.91  0.50 - 1.35 mg/dL    Calcium 8.9  8.4 - 47.8 mg/dL    GFR calc non Af Amer 62 (*) >90 mL/min    GFR calc Af Amer 71 (*) >90 mL/min   GLUCOSE, CAPILLARY     Status: Abnormal   Collection Time   03/11/12 11:55 AM      Component Value Range Comment   Glucose-Capillary 141 (*) 70 - 99 mg/dL   GLUCOSE, CAPILLARY     Status: Abnormal   Collection Time   03/11/12  4:44 PM      Component Value Range Comment   Glucose-Capillary 244 (*) 70 - 99 mg/dL   GLUCOSE, CAPILLARY     Status: Abnormal   Collection Time   03/11/12  9:29 PM      Component Value Range Comment   Glucose-Capillary 168 (*) 70 - 99 mg/dL   GLUCOSE, CAPILLARY      Status: Abnormal   Collection Time   03/12/12  5:42 AM      Component Value Range Comment   Glucose-Capillary 150 (*) 70 - 99 mg/dL   GLUCOSE, CAPILLARY     Status: Abnormal   Collection Time   03/12/12 11:46 AM      Component Value Range Comment   Glucose-Capillary 139 (*) 70 -  99 mg/dL     Radiological Exams on Admission: No results found.  Assessment/Plan Principal Problem:  *Major depression, recurrent Active Problems:  Hypokalemia  DM (diabetes mellitus), type 2, uncontrolled with complications  Hypothyroidism  Alcohol abuse  Anxiety disorder  HTN (hypertension)   Substance Abuse/Detox/Depression -As per psychiatry.  Hypokalemia -2/2 diuretic use in a patient who admits to being non-complaint with potassium supplementation. -Double diuretic regimen seems aggressive to me given we are only treating LE edema. -Will DC diuretics for now. -Start KCl q 4 hours for 4 doses. -Check magnesium level and replete if necessary. -Recheck K level and continue to replete as indicated.  Hypothyroidism -Continue synthroid. -Check TSH to make sure synthroid dose does not need adjustment.  DM -Fair control. -No change to regimen for now.  HTN -No acute medication changes unless he were to develop accelerated HTN. -Goal BP in a diabetic is <130/80.   Thank you for allowing [place] to participate in the care of this patient. Will follow along with you.   Time Spent on Consultation: 70 minutes.  HERNANDEZ ACOSTA,ESTELA [facility]  [phone] 03/12/2012, 4:20 PM

## 2012-03-12 NOTE — Progress Notes (Signed)
[facility] Adult Inpatient Family/Significant Other Suicide Prevention Education  Suicide Prevention Education:  Education Completed; Phillip Turner - wife [phone]),  (name of family member/significant other) has been identified by the patient as the family member/significant other with whom the patient will be residing, and identified as the person(s) who will aid the patient in the event of a mental health crisis (suicidal ideations/suicide attempt).  With written consent from the patient, the family member/significant other has been provided the following suicide prevention education, prior to the and/or following the discharge of the patient.  The suicide prevention education provided includes the following:  Suicide risk factors  Suicide prevention and interventions  National Suicide Hotline telephone number  [facility] assessment telephone number  [facility] Emergency Assistance 911  [facility] and/or Residential Mobile Crisis Unit telephone number  Request made of family/significant other to:  Remove weapons (e.g., guns, rifles, knives), all items previously/currently identified as safety concern.    Remove drugs/medications (over-the-counter, prescriptions, illicit drugs), all items previously/currently identified as a safety concern.  The family member/significant other verbalizes understanding of the suicide prevention education information provided.  The family member/significant other agrees to remove the items of safety concern listed above.  Phillip Turner 03/12/2012, 8:20 AM
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
## 2012-03-12 NOTE — Progress Notes (Signed)
D: Patient in hallway on approach.  Patient state he is feel much better.  Patient states he woke up agitated in the AM but as his day progressed he states he felt better.  Patient states she does not think his medications was not quite right so that was why he was agitated.  Patient denies SI/HI and denies AVH.   A: Staff to monitor Q 15 mins for safety.  Encouragement and support offered.  Scheduled medications administered per orders.    R: Patient remains safe on the unit.  Patient cooperative, calm and taking administered medications.  Patient attended karaoke group tonight.

## 2012-03-12 NOTE — Progress Notes (Signed)
[facility] MD Progress Note  03/12/2012 10:31 AM Phillip Turner  MRN:  [id number]  Diagnosis:   Axis I: Major Depression, Alcohol Dependence, Cocaine abuse Axis II: Deferred Axis III:  Past Medical History  Diagnosis Date  . Diabetes mellitus   . Hypertension   . Hypothyroidism   . Anxiety   . Depression   . Stroke     does not remember when   Axis IV: problems with primary support group Axis V: 51-60 moderate symptoms  ADL's:  Intact  Sleep: Poor  Appetite:  Fair  Suicidal Ideation:  Plan:  Denies Intent:  Denies Means:  Denies Homicidal Ideation:  Plan:  Denies Intent:  Denies Means:  Denies  Had a hard time falling asleep and when he did he woke up through the night. He is tired this morning. He has bee irritable, has had some issues with other patients. Issues with boundaries. He has a hard time reading people expression and seeing how he affects them.  Depression 8 Anxiety 8 Mental Status Examination/Evaluation: Objective:  Appearance: Fairly Groomed  Patent attorney::  Fair  Speech:  Clear and Coherent, Slow and Nor spontaneous  Volume:  Decreased  Mood:  Anxious and Depressed  Affect:  Restricted  Thought Process:  Coherent and Goal Directed  Orientation:  Full  Thought Content:  WDL  Suicidal Thoughts:  No  Homicidal Thoughts:  No  Memory:  Immediate;   Fair Recent;   Fair Remote;   Fair  Judgement:  Fair  Insight:  Shallow  Psychomotor Activity:  Decreased  Concentration:  Fair  Recall:  Fair  Akathisia:  No  Handed:  Right  AIMS (if indicated):     Assets:  Desire for Improvement Housing Social Support  Sleep:  Number of Hours: 4.5    Vital Signs:Blood pressure 159/92, pulse 63, temperature 97 F (36.1 C), temperature source Oral, resp. rate 20, height 5\' 10"  (1.778 m), weight 140.615 kg (310 lb). Current Medications: Current Facility-Administered Medications  Medication Dose Route Frequency Provider Last Rate Last Dose  . acetaminophen  (TYLENOL) tablet 650 mg  650 mg Oral Q6H PRN Verne Spurr, PA-C   650 mg at 03/09/12 2237  . albuterol (PROVENTIL) (5 MG/ML) 0.5% nebulizer solution 2.5 mg  2.5 mg Nebulization Q6H PRN Verne Spurr, PA-C      . alum & mag hydroxide-simeth (MAALOX/MYLANTA) 200-200-20 MG/5ML suspension 30 mL  30 mL Oral Q4H PRN Verne Spurr, PA-C      . aspirin tablet 325 mg  325 mg Oral Daily Verne Spurr, PA-C   325 mg at 03/12/12 0815  . [EXPIRED] chlordiazePOXIDE (LIBRIUM) capsule 25 mg  25 mg Oral QID Verne Spurr, PA-C   25 mg at 03/11/12 0800   Followed by  . [COMPLETED] chlordiazePOXIDE (LIBRIUM) capsule 25 mg  25 mg Oral TID Verne Spurr, PA-C   25 mg at 03/12/12 0815   Followed by  . chlordiazePOXIDE (LIBRIUM) capsule 25 mg  25 mg Oral BH-qamhs Verne Spurr, PA-C       Followed by  . chlordiazePOXIDE (LIBRIUM) capsule 25 mg  25 mg Oral Daily Verne Spurr, PA-C      . colchicine tablet 0.6 mg  0.6 mg Oral Daily Rachael Fee, MD   0.6 mg at 03/12/12 0816  . febuxostat (ULORIC) tablet 40 mg  40 mg Oral Daily Rachael Fee, MD   40 mg at 03/12/12 0815  . FLUoxetine (PROZAC) capsule 20 mg  20 mg Oral Daily Madie Reno A  Dub Mikes, MD   20 mg at 03/12/12 0817  . folic acid (FOLVITE) tablet 1 mg  1 mg Oral Daily Rachael Fee, MD   1 mg at 03/12/12 0816  . furosemide (LASIX) tablet 20 mg  20 mg Oral QODAY Rachael Fee, MD   20 mg at 03/11/12 0947  . glyBURIDE (DIABETA) tablet 2.5 mg  2.5 mg Oral BID WC Rachael Fee, MD   2.5 mg at 03/12/12 0815  . hydrOXYzine (ATARAX/VISTARIL) tablet 25 mg  25 mg Oral Q6H PRN Verne Spurr, PA-C   25 mg at 03/11/12 2055  . insulin aspart (novoLOG) injection 0-9 Units  0-9 Units Subcutaneous TID WC Verne Spurr, PA-C   1 Units at 03/12/12 [phone]  . levothyroxine (SYNTHROID, LEVOTHROID) tablet 200 mcg  200 mcg Oral QAC breakfast Verne Spurr, PA-C   200 mcg at 03/12/12 2130  . lisinopril (PRINIVIL,ZESTRIL) tablet 10 mg  10 mg Oral Daily Verne Spurr, PA-C   10 mg at 03/12/12  0816  . loperamide (IMODIUM) capsule 2-4 mg  2-4 mg Oral PRN Verne Spurr, PA-C      . magnesium hydroxide (MILK OF MAGNESIA) suspension 30 mL  30 mL Oral Daily PRN Verne Spurr, PA-C      . metolazone (ZAROXOLYN) tablet 5 mg  5 mg Oral QODAY Rachael Fee, MD   5 mg at 03/10/12 1213  . multivitamin with minerals tablet 1 tablet  1 tablet Oral Daily Verne Spurr, PA-C   1 tablet at 03/12/12 0814  . naproxen (NAPROSYN) tablet 500 mg  500 mg Oral BID WC Verne Spurr, PA-C   500 mg at 03/12/12 0815  . ondansetron (ZOFRAN-ODT) disintegrating tablet 4 mg  4 mg Oral Q6H PRN Verne Spurr, PA-C      . polyethylene glycol (MIRALAX / GLYCOLAX) packet 17 g  17 g Oral Daily PRN Verne Spurr, PA-C      . potassium chloride (KLOR-CON) packet 40 mEq  40 mEq Oral BID Rachael Fee, MD   40 mEq at 03/12/12 0815  . QUEtiapine (SEROQUEL) tablet 25 mg  25 mg Oral QHS Rachael Fee, MD   25 mg at 03/11/12 2056  . simvastatin (ZOCOR) tablet 20 mg  20 mg Oral Daily Rachael Fee, MD   20 mg at 03/12/12 0816  . thiamine (VITAMIN B-1) tablet 100 mg  100 mg Oral Daily Verne Spurr, PA-C   100 mg at 03/12/12 8657    Lab Results:  Results for orders placed during the hospital encounter of 03/09/12 (from the past 48 hour(s))  GLUCOSE, CAPILLARY     Status: Abnormal   Collection Time   03/10/12 12:08 PM      Component Value Range Comment   Glucose-Capillary 150 (*) 70 - 99 mg/dL   GLUCOSE, CAPILLARY     Status: Abnormal   Collection Time   03/10/12  4:55 PM      Component Value Range Comment   Glucose-Capillary 172 (*) 70 - 99 mg/dL   GLUCOSE, CAPILLARY     Status: Abnormal   Collection Time   03/10/12  9:37 PM      Component Value Range Comment   Glucose-Capillary 169 (*) 70 - 99 mg/dL    Comment 1 Notify RN     GLUCOSE, CAPILLARY     Status: Abnormal   Collection Time   03/11/12  6:01 AM      Component Value Range Comment   Glucose-Capillary 138 (*) 70 -  99 mg/dL   BASIC METABOLIC PANEL     Status:  Abnormal   Collection Time   03/11/12  6:30 AM      Component Value Range Comment   Sodium 138  135 - 145 mEq/L    Potassium 2.8 (*) 3.5 - 5.1 mEq/L    Chloride 100  96 - 112 mEq/L    CO2 26  19 - 32 mEq/L    Glucose, Bld 158 (*) 70 - 99 mg/dL    BUN 12  6 - 23 mg/dL    Creatinine, Ser 4.54  0.50 - 1.35 mg/dL    Calcium 8.9  8.4 - 09.8 mg/dL    GFR calc non Af Amer 62 (*) >90 mL/min    GFR calc Af Amer 71 (*) >90 mL/min   GLUCOSE, CAPILLARY     Status: Abnormal   Collection Time   03/11/12 11:55 AM      Component Value Range Comment   Glucose-Capillary 141 (*) 70 - 99 mg/dL   GLUCOSE, CAPILLARY     Status: Abnormal   Collection Time   03/11/12  4:44 PM      Component Value Range Comment   Glucose-Capillary 244 (*) 70 - 99 mg/dL   GLUCOSE, CAPILLARY     Status: Abnormal   Collection Time   03/11/12  9:29 PM      Component Value Range Comment   Glucose-Capillary 168 (*) 70 - 99 mg/dL   GLUCOSE, CAPILLARY     Status: Abnormal   Collection Time   03/12/12  5:42 AM      Component Value Range Comment   Glucose-Capillary 150 (*) 70 - 99 mg/dL     Physical Findings: AIMS: Facial and Oral Movements Muscles of Facial Expression: None, normal Lips and Perioral Area: None, normal Jaw: None, normal Tongue: None, normal,Extremity Movements Upper (arms, wrists, hands, fingers): None, normal Lower (legs, knees, ankles, toes): None, normal, Trunk Movements Neck, shoulders, hips: None, normal, Overall Severity Severity of abnormal movements (highest score from questions above): None, normal Incapacitation due to abnormal movements: None, normal Patient's awareness of abnormal movements (rate only patient's report): No Awareness, Dental Status Current problems with teeth and/or dentures?: No Does patient usually wear dentures?: No  CIWA:  CIWA-Ar Total: 0  COWS:     Treatment Plan Summary: Daily contact with patient to assess and evaluate symptoms and progress in treatment Medication  management Consult Internal Medicine: Low Potassium Plan: Increase Prozac 30 mg daily           Increase Seroquel to 200 mg HS  Derron Pipkins A 03/12/2012, 10:31 AM

## 2012-03-12 NOTE — Progress Notes (Signed)
Patient attended group: Writer used therapy ball to ask questions about patients. Writer also discussed with group alternative positive ways to cope with negative behaviors. 

## 2012-03-13 DIAGNOSIS — E039 Hypothyroidism, unspecified: Secondary | ICD-10-CM

## 2012-03-13 LAB — TSH: TSH: 0.17 u[IU]/mL — ABNORMAL LOW (ref 0.350–4.500)

## 2012-03-13 LAB — BASIC METABOLIC PANEL
CO2: 22 mEq/L (ref 19–32)
Calcium: 9 mg/dL (ref 8.4–10.5)
Creatinine, Ser: 1.41 mg/dL — ABNORMAL HIGH (ref 0.50–1.35)
GFR calc Af Amer: 65 mL/min — ABNORMAL LOW (ref >90)

## 2012-03-13 LAB — GLUCOSE, CAPILLARY: Glucose-Capillary: 336 mg/dL — ABNORMAL HIGH (ref 70–99)

## 2012-03-13 MED ORDER — FLUOXETINE HCL 20 MG PO CAPS
40.0000 mg | ORAL_CAPSULE | Freq: Every day | ORAL | Status: DC
  Administered 2012-03-14 – 2012-03-17 (×4): 40 mg via ORAL
  Filled 2012-03-13 (×6): qty 2

## 2012-03-13 MED ORDER — FUROSEMIDE 20 MG PO TABS
20.0000 mg | ORAL_TABLET | ORAL | Status: DC
  Administered 2012-03-15 – 2012-03-17 (×2): 20 mg via ORAL
  Filled 2012-03-13 (×3): qty 1

## 2012-03-13 MED ORDER — QUETIAPINE FUMARATE 50 MG PO TABS
150.0000 mg | ORAL_TABLET | Freq: Every day | ORAL | Status: DC
  Administered 2012-03-13: 150 mg via ORAL
  Filled 2012-03-13 (×3): qty 1

## 2012-03-13 MED ORDER — POTASSIUM CHLORIDE CRYS ER 20 MEQ PO TBCR
40.0000 meq | EXTENDED_RELEASE_TABLET | Freq: Once | ORAL | Status: AC
  Administered 2012-03-13: 40 meq via ORAL
  Filled 2012-03-13: qty 2

## 2012-03-13 MED ORDER — FLUOXETINE HCL 10 MG PO CAPS
10.0000 mg | ORAL_CAPSULE | Freq: Once | ORAL | Status: AC
  Administered 2012-03-13: 10 mg via ORAL
  Filled 2012-03-13 (×2): qty 1

## 2012-03-13 NOTE — Tx Team (Signed)
Interdisciplinary Treatment Plan Update (Adult)   Date: 03/13/2012  Time Reviewed: 9:54 AM   Progress in Treatment:  Attending groups: Yes  Participating in groups: Yes  Taking medication as prescribed: Yes  Tolerating medication: Yes  Family/Significant othe contact made: Yes Patient understands diagnosis: Yes  Discussing patient identified problems/goals with staff: Yes  Medical problems stabilized or resolved: Yes  Denies suicidal/homicidal ideation: Yes  Issues/concerns per patient self-inventory: None identified  Other: N/A  New problem(s) identified: None Identified   Reason for Continuation of Hospitalization:  Anxiety  Depression  Medication stabilization   Interventions implemented related to continuation of hospitalization: mood stabilization, medication monitoring and adjustment, group therapy and psycho education, safety checks q 15 mins   Additional comments: N/A   Estimated length of stay: 3-4 days   Discharge Plan: SW is assessing for appropriate referrals. Pt has follow up scheduled at [facility].   New goal(s): N/A   Review of initial/current patient goals per problem list:   1. Goal(s): Address substance use  Met: No  Target date: by discharge  As evidenced by: completing detox protocol and refer to appropriate treatment 2. Goal (s): Reduce depressive and anxiety symptoms  Met: No  Target date: by discharge  As evidenced by: Reducing depression from a 10 to a 3 as reported by pt. Pt rates at a 6 today.   3. Goal(s): Eliminate SI  Met: Yes Target date: by discharge  As evidenced by: Pt denying SI.    Attendees:  Patient:    Family:    Physician: Geoffery Lyons, MD  03/13/2012 9:54 AM   Nursing: Alease Frame, RN  03/13/2012 9:54 AM   Clinical Social Worker: Reyes Ivan, LCSWA  03/13/2012 9:54 AM   Other:  03/13/2012 9:54 AM   Other:    Other:    Other:    Other:    Scribe for Treatment Team:  Reyes Ivan 03/13/2012 9:54 AM

## 2012-03-13 NOTE — Progress Notes (Signed)
Psychoeducational Group Note  Date:  03/13/2012 Time: 2000 Group Topic/Focus:  AA group  Participation Level:  Active  Participation Quality:  Appropriate  Affect:  Appropriate  Cognitive:  Appropriate  Insight:  Good  Engagement in Group:  Good  Additional Comments:  Pt. attended and participated in AA group  Phillip Turner 03/13/2012, 9:55 PM

## 2012-03-13 NOTE — Progress Notes (Signed)
[facility] MD Progress Note  03/13/2012 3:58 PM Phillip Turner  MRN:  [id number]  Diagnosis:  Major Depression, Alcohol Dependence  ADL's:  Intact  Sleep: Fair  Appetite:  Fair  Suicidal Ideation:  Plan:  Denies Intent:  Denies Means:  Denies Homicidal Ideation:  Plan:  Denies Intent:  Denies Means:  Denies  Slept, but feels that the Seroquel was too strong. Still endorses the depression. Would like to increase the Prozac to 40 mg daily. The potassium was corrected. Now without the diuretics, he is swelling again. Would like take the Lasix every other day  Mental Status Examination/Evaluation: Objective:  Appearance: Fairly Groomed  Patent attorney::  Fair  Speech:  Clear and Coherent, Slow and not spontaneous  Volume:  Normal  Mood:  Depressed  Affect:  Restricted  Thought Process:  Coherent and Goal Directed  Orientation:  Full  Thought Content:  WDL  Suicidal Thoughts:  No  Homicidal Thoughts:  No  Memory:  Immediate;   Fair Recent;   Fair Remote;   Fair  Judgement:  Fair  Insight:  Present  Psychomotor Activity:  Normal  Concentration:  Fair  Recall:  Fair  Akathisia:  No  Handed:  Right  AIMS (if indicated):     Assets:  Desire for Improvement Housing  Sleep:  Number of Hours: 5.5    Vital Signs:Blood pressure 161/84, pulse 68, temperature 97.7 F (36.5 C), temperature source Oral, resp. rate 20, height 5\' 10"  (1.778 m), weight 140.615 kg (310 lb). Current Medications: Current Facility-Administered Medications  Medication Dose Route Frequency Provider Last Rate Last Dose  . acetaminophen (TYLENOL) tablet 650 mg  650 mg Oral Q6H PRN Verne Spurr, PA-C   650 mg at 03/09/12 2237  . albuterol (PROVENTIL) (5 MG/ML) 0.5% nebulizer solution 2.5 mg  2.5 mg Nebulization Q6H PRN Verne Spurr, PA-C      . alum & mag hydroxide-simeth (MAALOX/MYLANTA) 200-200-20 MG/5ML suspension 30 mL  30 mL Oral Q4H PRN Verne Spurr, PA-C      . aspirin tablet 325 mg  325 mg Oral Daily  Verne Spurr, PA-C   325 mg at 03/13/12 7829  . [COMPLETED] chlordiazePOXIDE (LIBRIUM) capsule 25 mg  25 mg Oral BH-qamhs Verne Spurr, PA-C   25 mg at 03/13/12 5621   Followed by  . chlordiazePOXIDE (LIBRIUM) capsule 25 mg  25 mg Oral Daily Verne Spurr, PA-C      . colchicine tablet 0.6 mg  0.6 mg Oral Daily Rachael Fee, MD   0.6 mg at 03/13/12 3086  . febuxostat (ULORIC) tablet 40 mg  40 mg Oral Daily Rachael Fee, MD   40 mg at 03/13/12 0834  . [COMPLETED] FLUoxetine (PROZAC) capsule 10 mg  10 mg Oral Once Rachael Fee, MD   10 mg at 03/13/12 1253  . FLUoxetine (PROZAC) capsule 40 mg  40 mg Oral Daily Rachael Fee, MD      . folic acid (FOLVITE) tablet 1 mg  1 mg Oral Daily Rachael Fee, MD   1 mg at 03/13/12 0834  . gabapentin (NEURONTIN) capsule 300 mg  300 mg Oral TID Rachael Fee, MD   300 mg at 03/13/12 1253  . glyBURIDE (DIABETA) tablet 2.5 mg  2.5 mg Oral BID WC Rachael Fee, MD   2.5 mg at 03/13/12 5784  . [EXPIRED] hydrOXYzine (ATARAX/VISTARIL) tablet 25 mg  25 mg Oral Q6H PRN Verne Spurr, PA-C   25 mg at 03/11/12 2055  .  insulin aspart (novoLOG) injection 0-9 Units  0-9 Units Subcutaneous TID WC Verne Spurr, PA-C   2 Units at 03/13/12 1254  . levothyroxine (SYNTHROID, LEVOTHROID) tablet 200 mcg  200 mcg Oral QAC breakfast Verne Spurr, PA-C   200 mcg at 03/13/12 1610  . lisinopril (PRINIVIL,ZESTRIL) tablet 10 mg  10 mg Oral Daily Verne Spurr, PA-C   10 mg at 03/13/12 0835  . [EXPIRED] loperamide (IMODIUM) capsule 2-4 mg  2-4 mg Oral PRN Verne Spurr, PA-C      . magnesium hydroxide (MILK OF MAGNESIA) suspension 30 mL  30 mL Oral Daily PRN Verne Spurr, PA-C      . multivitamin with minerals tablet 1 tablet  1 tablet Oral Daily Verne Spurr, PA-C   1 tablet at 03/13/12 0834  . naproxen (NAPROSYN) tablet 500 mg  500 mg Oral BID WC Verne Spurr, PA-C   500 mg at 03/13/12 0835  . [EXPIRED] ondansetron (ZOFRAN-ODT) disintegrating tablet 4 mg  4 mg Oral Q6H PRN Verne Spurr, PA-C      . polyethylene glycol (MIRALAX / GLYCOLAX) packet 17 g  17 g Oral Daily PRN Verne Spurr, PA-C      . [COMPLETED] potassium chloride SA (K-DUR,KLOR-CON) CR tablet 40 mEq  40 mEq Oral Q4H Henderson Cloud, MD   40 mEq at 03/12/12 2001  . potassium chloride SA (K-DUR,KLOR-CON) CR tablet 40 mEq  40 mEq Oral Once Henderson Cloud, MD      . QUEtiapine (SEROQUEL) tablet 150 mg  150 mg Oral QHS Rachael Fee, MD      . simvastatin (ZOCOR) tablet 20 mg  20 mg Oral Daily Rachael Fee, MD   20 mg at 03/13/12 [phone]  . thiamine (VITAMIN B-1) tablet 100 mg  100 mg Oral Daily Verne Spurr, PA-C   100 mg at 03/13/12 5409  . [DISCONTINUED] FLUoxetine (PROZAC) capsule 30 mg  30 mg Oral Daily Rachael Fee, MD   30 mg at 03/13/12 [phone]  . [DISCONTINUED] QUEtiapine (SEROQUEL) tablet 200 mg  200 mg Oral QHS Rachael Fee, MD   200 mg at 03/12/12 2140    Lab Results:  Results for orders placed during the hospital encounter of 03/09/12 (from the past 48 hour(s))  GLUCOSE, CAPILLARY     Status: Abnormal   Collection Time   03/11/12  4:44 PM      Component Value Range Comment   Glucose-Capillary 244 (*) 70 - 99 mg/dL   GLUCOSE, CAPILLARY     Status: Abnormal   Collection Time   03/11/12  9:29 PM      Component Value Range Comment   Glucose-Capillary 168 (*) 70 - 99 mg/dL   GLUCOSE, CAPILLARY     Status: Abnormal   Collection Time   03/12/12  5:42 AM      Component Value Range Comment   Glucose-Capillary 150 (*) 70 - 99 mg/dL   GLUCOSE, CAPILLARY     Status: Abnormal   Collection Time   03/12/12 11:46 AM      Component Value Range Comment   Glucose-Capillary 139 (*) 70 - 99 mg/dL   GLUCOSE, CAPILLARY     Status: Abnormal   Collection Time   03/12/12  5:08 PM      Component Value Range Comment   Glucose-Capillary 207 (*) 70 - 99 mg/dL   BASIC METABOLIC PANEL     Status: Abnormal   Collection Time   03/12/12  8:20 PM  Component Value Range Comment   Sodium 145   135 - 145 mEq/L DELTA CHECK NOTED   Potassium 3.7  3.5 - 5.1 mEq/L DELTA CHECK NOTED   Chloride 110  96 - 112 mEq/L DELTA CHECK NOTED   CO2 26  19 - 32 mEq/L    Glucose, Bld 113 (*) 70 - 99 mg/dL    BUN 11  6 - 23 mg/dL    Creatinine, Ser 1.61 (*) 0.50 - 1.35 mg/dL    Calcium 9.0  8.4 - 09.6 mg/dL    GFR calc non Af Amer 38 (*) >90 mL/min    GFR calc Af Amer 44 (*) >90 mL/min   MAGNESIUM     Status: Normal   Collection Time   03/12/12  8:20 PM      Component Value Range Comment   Magnesium 1.9  1.5 - 2.5 mg/dL   TSH     Status: Abnormal   Collection Time   03/12/12  8:20 PM      Component Value Range Comment   TSH 0.170 (*) 0.350 - 4.500 uIU/mL   GLUCOSE, CAPILLARY     Status: Abnormal   Collection Time   03/12/12  9:38 PM      Component Value Range Comment   Glucose-Capillary 151 (*) 70 - 99 mg/dL   GLUCOSE, CAPILLARY     Status: Abnormal   Collection Time   03/13/12  6:09 AM      Component Value Range Comment   Glucose-Capillary 184 (*) 70 - 99 mg/dL   BASIC METABOLIC PANEL     Status: Abnormal   Collection Time   03/13/12  6:22 AM      Component Value Range Comment   Sodium 139  135 - 145 mEq/L    Potassium 3.6  3.5 - 5.1 mEq/L    Chloride 106  96 - 112 mEq/L    CO2 22  19 - 32 mEq/L    Glucose, Bld 185 (*) 70 - 99 mg/dL    BUN 12  6 - 23 mg/dL    Creatinine, Ser 0.45 (*) 0.50 - 1.35 mg/dL    Calcium 9.0  8.4 - 40.9 mg/dL    GFR calc non Af Amer 56 (*) >90 mL/min    GFR calc Af Amer 65 (*) >90 mL/min   GLUCOSE, CAPILLARY     Status: Abnormal   Collection Time   03/13/12 12:03 PM      Component Value Range Comment   Glucose-Capillary 157 (*) 70 - 99 mg/dL     Physical Findings: AIMS: Facial and Oral Movements Muscles of Facial Expression: None, normal Lips and Perioral Area: None, normal Jaw: None, normal Tongue: None, normal,Extremity Movements Upper (arms, wrists, hands, fingers): None, normal Lower (legs, knees, ankles, toes): None, normal, Trunk  Movements Neck, shoulders, hips: None, normal, Overall Severity Severity of abnormal movements (highest score from questions above): None, normal Incapacitation due to abnormal movements: None, normal Patient's awareness of abnormal movements (rate only patient's report): No Awareness, Dental Status Current problems with teeth and/or dentures?: No Does patient usually wear dentures?: No  CIWA:  CIWA-Ar Total: 0  COWS:  COWS Total Score: 0   Treatment Plan Summary: Daily contact with patient to assess and evaluate symptoms and progress in treatment Medication management  Plan:  Supportive approach/coping skills/relapse prevention            Will increase the Prozac to 40 mg in AM  Will decrease the Seroquel to 150 mg HS            Will monitor the Potassium            Due to the swelling, would like to have the Lasix every other day Nole Robey A 03/13/2012, 3:58 PM

## 2012-03-13 NOTE — Progress Notes (Signed)
D) Pt rates his depression and hopelessness both at a 6. Admits to feelings of SI on and off. Affect is flat and mood depressed. Pt has attended the program and interacts with his peers.  A) Given support and reassurance. Given praise for coming to the hospital and being willing to work the program.  R) Pt is depressed with sad affect and depressed mood. Continues to endorse thoughts of self harm on and off. Contracting for his safety while in the hospital.

## 2012-03-13 NOTE — Social Work (Signed)
Aftercare Planning Group: 03/13/2012 9:45 AM  Pt attended discharge planning group and actively participated in group.  CSW provided pt with today's workbook.  Pt presents with calm mood and affect.  Pt rates depression and anxiety at a 6 today.  Pt denies SI/HI.  Pt reports feeling better today.  Pt discussed gaining insight from his peers which has helped him.  Pt wants to go to [facility] but no beds were available today.  Pt is open to going to outpatient if no beds are available on Monday.  No further needs voiced by pt at this time.    [facility] Group Note : Clinical Social Worker Group Therapy  03/13/2012  1:15 PM  Type of Therapy:  Group Therapy  Participation Level:  Appropriate  Participation Quality:  Appropriate   Affect:  Appropriate  Cognitive:  Alert  Insight:  Good  Engagement in Group:  Good  Engagement in Therapy:  Good  Modes of Intervention:  Clarification, Education, Problem-solving, Socialization and Support  Summary of Progress/Problems: The topic for today was feelings about relapse.  Pt discussed what relapse prevention is to them and identified triggers that they are on the path to relapse.  Pt processed their feeling towards relapse and was able to relate to peers.  Pt discussed coping skills that can be used for relapse prevention.   Pt discussed a fear of becoming his father.     Kalana Yust Horton, LCSWA 03/13/2012 3:00 pm

## 2012-03-13 NOTE — Progress Notes (Signed)
I have reviewed Ha's lab work. He has responded nicely to PO potassium repletion. I will continue to hold diuretics at this point. Will give him another dose of KCl today and recheck a BMET in 48 hours (on Sunday). Magnesium level is normal at 1.9 and does not require repletion. Will follow up again on Sunday. Please call us sooner if we can be of assistance before then.  Peggye Pitt, MD [facility] Pager: [phone]

## 2012-03-13 NOTE — Progress Notes (Signed)
Psychoeducational Group Note  Date:  03/13/2012 Time:  1315  Group Topic/Focus:  Relapse Prevention Planning:   The focus of this group is to define relapse and discuss the need for planning to combat relapse.  Participation Level:  Active  Participation Quality:  Appropriate, Attentive, Sharing and Supportive  Affect:  Appropriate  Cognitive:  Alert, Appropriate and Oriented  Insight:  Good  Engagement in Group:  Good  Additional Comments:  Pt attended and participated in group. Pt stated in group that because of not only the staff but esp the pts within the group, he has learned so many valuable things that he will take with him once he leaves. Pt stated that he understands his triggers and how he can cope with them in a healthy manner. Pt supported peers discussion and engaged in the group continuously.   Dalia Heading 03/13/2012, 6:56 PM

## 2012-03-13 NOTE — Progress Notes (Signed)
Patient did attend the evening karaoke group.  

## 2012-03-14 DIAGNOSIS — F329 Major depressive disorder, single episode, unspecified: Secondary | ICD-10-CM

## 2012-03-14 LAB — GLUCOSE, CAPILLARY
Glucose-Capillary: 188 mg/dL — ABNORMAL HIGH (ref 70–99)
Glucose-Capillary: 164 mg/dL — ABNORMAL HIGH (ref 70–99)
Glucose-Capillary: 134 mg/dL — ABNORMAL HIGH (ref 70–99)
Glucose-Capillary: 148 mg/dL — ABNORMAL HIGH (ref 70–99)

## 2012-03-14 LAB — BASIC METABOLIC PANEL
BUN: 9 mg/dL (ref 6–23)
Chloride: 107 mEq/L (ref 96–112)
GFR calc Af Amer: 66 mL/min — ABNORMAL LOW (ref >90)
Potassium: 3.9 mEq/L (ref 3.5–5.1)

## 2012-03-14 MED ORDER — GABAPENTIN 300 MG PO CAPS
300.0000 mg | ORAL_CAPSULE | Freq: Three times a day (TID) | ORAL | Status: DC
  Administered 2012-03-14 – 2012-03-17 (×13): 300 mg via ORAL
  Filled 2012-03-14 (×20): qty 1

## 2012-03-14 MED ORDER — QUETIAPINE FUMARATE 50 MG PO TABS
150.0000 mg | ORAL_TABLET | Freq: Every day | ORAL | Status: DC
  Administered 2012-03-14 – 2012-03-16 (×3): 150 mg via ORAL
  Filled 2012-03-14 (×5): qty 1

## 2012-03-14 NOTE — Progress Notes (Signed)
Date: 03/14/2012  Time: 1315  Group Topic/Focus:  Identifying Needs  Participation Level: Active  Participation Quality: Appropriate, Attentive and Sharing  Affect: Appropriate  Cognitive: Alert and Appropriate  Insight: Good  Engagement in Group: Good   

## 2012-03-14 NOTE — Progress Notes (Signed)
Psychoeducational Group Note  Date:  03/14/2012 Time:  2000  Group Topic/Focus:  Wrap-Up Group:   The focus of this group is to help patients review their daily goal of treatment and discuss progress on daily workbooks.  Participation Level:  Active  Participation Quality:  Appropriate, Redirectable, Sharing and Supportive  Affect:  Appropriate  Cognitive:  Appropriate  Insight:  Good  Engagement in Group:  Good  Additional Comments:  none  Jemuel Laursen M 03/14/2012, 10:03 PM

## 2012-03-14 NOTE — Progress Notes (Signed)
Patient ID: Phillip Turner, male   DOB: 25-Nov-1960, 51 y.o.   MRN: [id number] D)  Came to med window this evening asking for seroquel,  States his meds are helping, feels he has gained a lot by going to groups today, programmed on [address] hall, life skills classes. Affect brighter.  Interacting well with select peers and staff, pleasant, cooperative, compliant.   A)  Was given support, encouragement. Will continue to monitor for safety.  R)  Receptive, appreciative.

## 2012-03-14 NOTE — Progress Notes (Signed)
Psychoeducational Group Note  Date:  03/14/2012 Time:  1515  Group Topic/Focus:  Coping With Mental Health Crisis:   The purpose of this group is to help patients identify strategies for coping with mental health crisis.  Group discusses possible causes of crisis and ways to manage them effectively.  Participation Level:  Active  Participation Quality:  Appropriate, Resistant and Sharing  Affect:  Appropriate  Cognitive:  Appropriate  Insight:  Good  Engagement in Group:  Good  Additional Comments:  none  Keishawna Carranza, Genia Plants 03/14/2012, 6:05 PM

## 2012-03-14 NOTE — Clinical Social Work Psych Note (Signed)
[facility] Group Notes:  (Counselor/Nursing/MHT/Case Management/Adjunct)  03/14/2012   Type of Therapy:  Group Therapy  Participation Level:  Active  Participation Quality:  Appropriate  Affect:  Appropriate  Cognitive:  Alert  Insight:  Good  Engagement in Group:  Good  Engagement in Therapy:  Good  Modes of Intervention:  Socialization, Support, Clarification and Education  Summary of Progress/Problems:The main focus of the process group was for the patient to identify self-sabatoging behaviors that they engage in the hinder them from meeting or maintaining their goals. The patient stated that he spends all his money on drugs and alcohol.   Phillip Turner Claudette Laws, [place] 03/14/2012 1:06 PM'

## 2012-03-14 NOTE — Progress Notes (Signed)
[facility] MD Progress Note  03/14/2012 11:04 AM Phillip Turner  MRN:  [id number]  Diagnosis:   Axis I: Anxiety Disorder NOS, Depressive Disorder secondary to general medical condition and Alcohol abuse and cocaine abuse Axis II: Deferred Axis III:  Past Medical History  Diagnosis Date  . Diabetes mellitus   . Hypertension   . Hypothyroidism   . Anxiety   . Depression   . Stroke     does not remember when   Subjective: Phillip Turner reports that his depression and anxiety are slightly increased today. On a scale of 1-10, where 10 is the worst, he rates both his depression and anxiety as an 8, where yesterday they were a 6. He endorses suicidal thoughts but denies any plan or intent. He denies any homicidal thoughts, or auditory or visual hallucinations. He denies any cravings for alcohol or other substances. He denies any withdrawal symptoms. He reports he slept well last night on the decreased dose of Seroquel, and did not have any disturbing dreams. His appetite is good. He asked if his Neurontin could be increased to 4 times daily.  ADL's:  Intact  Sleep: Good  Appetite:  Good  Suicidal Ideation:  Patient endorses thoughts, but denies plan or intent Homicidal Ideation:  Patient denies any thought, plan, or intent  AEB (as evidenced by):  Mental Status Examination/Evaluation: Objective:  Appearance: Casual and Fairly Groomed  Eye Contact::  Good  Speech:  Clear and Coherent  Volume:  Normal  Mood:  Depressed  Affect:  Congruent  Thought Process:  Circumstantial  Orientation:  Full  Thought Content:  WDL  Suicidal Thoughts:  Yes.  without intent/plan  Homicidal Thoughts:  No  Memory:  Immediate;   Good Recent;   Good Remote;   Good  Judgement:  Fair  Insight:  Fair  Psychomotor Activity:  Psychomotor Retardation  Concentration:  Good  Recall:  Good  Akathisia:  No  Handed:    AIMS (if indicated):     Assets:  Communication Skills Desire for Improvement  Sleep:  Number  of Hours: 5    Vital Signs:Blood pressure 161/84, pulse 68, temperature 97.7 F (36.5 C), temperature source Oral, resp. rate 20, height 5\' 10"  (1.778 m), weight 140.615 kg (310 lb). Current Medications: Current Facility-Administered Medications  Medication Dose Route Frequency Provider Last Rate Last Dose  . acetaminophen (TYLENOL) tablet 650 mg  650 mg Oral Q6H PRN Verne Spurr, PA-C   650 mg at 03/09/12 2237  . albuterol (PROVENTIL) (5 MG/ML) 0.5% nebulizer solution 2.5 mg  2.5 mg Nebulization Q6H PRN Verne Spurr, PA-C      . alum & mag hydroxide-simeth (MAALOX/MYLANTA) 200-200-20 MG/5ML suspension 30 mL  30 mL Oral Q4H PRN Verne Spurr, PA-C      . aspirin tablet 325 mg  325 mg Oral Daily Verne Spurr, PA-C   325 mg at 03/14/12 0917  . [COMPLETED] chlordiazePOXIDE (LIBRIUM) capsule 25 mg  25 mg Oral Daily Verne Spurr, PA-C   25 mg at 03/14/12 0916  . colchicine tablet 0.6 mg  0.6 mg Oral Daily Rachael Fee, MD   0.6 mg at 03/14/12 0917  . febuxostat (ULORIC) tablet 40 mg  40 mg Oral Daily Rachael Fee, MD   40 mg at 03/14/12 0916  . [COMPLETED] FLUoxetine (PROZAC) capsule 10 mg  10 mg Oral Once Rachael Fee, MD   10 mg at 03/13/12 1253  . FLUoxetine (PROZAC) capsule 40 mg  40 mg Oral Daily Madie Reno  Jorja Loa, MD   40 mg at 03/14/12 0918  . folic acid (FOLVITE) tablet 1 mg  1 mg Oral Daily Rachael Fee, MD   1 mg at 03/14/12 1610  . furosemide (LASIX) tablet 20 mg  20 mg Oral QODAY Rachael Fee, MD      . gabapentin (NEURONTIN) capsule 300 mg  300 mg Oral TID WC & HS Jorje Guild, PA-C      . glyBURIDE (DIABETA) tablet 2.5 mg  2.5 mg Oral BID WC Rachael Fee, MD   2.5 mg at 03/14/12 0917  . insulin aspart (novoLOG) injection 0-9 Units  0-9 Units Subcutaneous TID WC Verne Spurr, PA-C      . levothyroxine (SYNTHROID, LEVOTHROID) tablet 200 mcg  200 mcg Oral QAC breakfast Verne Spurr, PA-C   200 mcg at 03/14/12 0710  . lisinopril (PRINIVIL,ZESTRIL) tablet 10 mg  10 mg Oral Daily Verne Spurr, PA-C   10 mg at 03/14/12 9604  . magnesium hydroxide (MILK OF MAGNESIA) suspension 30 mL  30 mL Oral Daily PRN Verne Spurr, PA-C      . multivitamin with minerals tablet 1 tablet  1 tablet Oral Daily Verne Spurr, PA-C   1 tablet at 03/14/12 0916  . naproxen (NAPROSYN) tablet 500 mg  500 mg Oral BID WC Verne Spurr, PA-C   500 mg at 03/14/12 0919  . polyethylene glycol (MIRALAX / GLYCOLAX) packet 17 g  17 g Oral Daily PRN Verne Spurr, PA-C      . [COMPLETED] potassium chloride SA (K-DUR,KLOR-CON) CR tablet 40 mEq  40 mEq Oral Once Henderson Cloud, MD   40 mEq at 03/13/12 1717  . QUEtiapine (SEROQUEL) tablet 150 mg  150 mg Oral QHS Rachael Fee, MD   150 mg at 03/13/12 2011  . simvastatin (ZOCOR) tablet 20 mg  20 mg Oral Daily Rachael Fee, MD   20 mg at 03/14/12 0918  . thiamine (VITAMIN B-1) tablet 100 mg  100 mg Oral Daily Verne Spurr, PA-C   100 mg at 03/13/12 5409  . [DISCONTINUED] FLUoxetine (PROZAC) capsule 30 mg  30 mg Oral Daily Rachael Fee, MD   30 mg at 03/13/12 [phone]  . [DISCONTINUED] gabapentin (NEURONTIN) capsule 300 mg  300 mg Oral TID Rachael Fee, MD   300 mg at 03/14/12 0919  . [DISCONTINUED] QUEtiapine (SEROQUEL) tablet 200 mg  200 mg Oral QHS Rachael Fee, MD   200 mg at 03/12/12 2140    Lab Results:  Results for orders placed during the hospital encounter of 03/09/12 (from the past 48 hour(s))  GLUCOSE, CAPILLARY     Status: Abnormal   Collection Time   03/12/12 11:46 AM      Component Value Range Comment   Glucose-Capillary 139 (*) 70 - 99 mg/dL   GLUCOSE, CAPILLARY     Status: Abnormal   Collection Time   03/12/12  5:08 PM      Component Value Range Comment   Glucose-Capillary 207 (*) 70 - 99 mg/dL   BASIC METABOLIC PANEL     Status: Abnormal   Collection Time   03/12/12  8:20 PM      Component Value Range Comment   Sodium 145  135 - 145 mEq/L DELTA CHECK NOTED   Potassium 3.7  3.5 - 5.1 mEq/L DELTA CHECK NOTED   Chloride 110  96 -  112 mEq/L DELTA CHECK NOTED   CO2 26  19 - 32 mEq/L  Glucose, Bld 113 (*) 70 - 99 mg/dL    BUN 11  6 - 23 mg/dL    Creatinine, Ser 1.61 (*) 0.50 - 1.35 mg/dL    Calcium 9.0  8.4 - 09.6 mg/dL    GFR calc non Af Amer 38 (*) >90 mL/min    GFR calc Af Amer 44 (*) >90 mL/min   MAGNESIUM     Status: Normal   Collection Time   03/12/12  8:20 PM      Component Value Range Comment   Magnesium 1.9  1.5 - 2.5 mg/dL   TSH     Status: Abnormal   Collection Time   03/12/12  8:20 PM      Component Value Range Comment   TSH 0.170 (*) 0.350 - 4.500 uIU/mL   GLUCOSE, CAPILLARY     Status: Abnormal   Collection Time   03/12/12  9:38 PM      Component Value Range Comment   Glucose-Capillary 151 (*) 70 - 99 mg/dL   GLUCOSE, CAPILLARY     Status: Abnormal   Collection Time   03/13/12  6:09 AM      Component Value Range Comment   Glucose-Capillary 184 (*) 70 - 99 mg/dL   BASIC METABOLIC PANEL     Status: Abnormal   Collection Time   03/13/12  6:22 AM      Component Value Range Comment   Sodium 139  135 - 145 mEq/L    Potassium 3.6  3.5 - 5.1 mEq/L    Chloride 106  96 - 112 mEq/L    CO2 22  19 - 32 mEq/L    Glucose, Bld 185 (*) 70 - 99 mg/dL    BUN 12  6 - 23 mg/dL    Creatinine, Ser 0.45 (*) 0.50 - 1.35 mg/dL    Calcium 9.0  8.4 - 40.9 mg/dL    GFR calc non Af Amer 56 (*) >90 mL/min    GFR calc Af Amer 65 (*) >90 mL/min   GLUCOSE, CAPILLARY     Status: Abnormal   Collection Time   03/13/12 12:03 PM      Component Value Range Comment   Glucose-Capillary 157 (*) 70 - 99 mg/dL   GLUCOSE, CAPILLARY     Status: Abnormal   Collection Time   03/13/12  5:07 PM      Component Value Range Comment   Glucose-Capillary 336 (*) 70 - 99 mg/dL   GLUCOSE, CAPILLARY     Status: Abnormal   Collection Time   03/13/12  9:19 PM      Component Value Range Comment   Glucose-Capillary 171 (*) 70 - 99 mg/dL   GLUCOSE, CAPILLARY     Status: Abnormal   Collection Time   03/14/12  6:55 AM      Component Value Range  Comment   Glucose-Capillary 188 (*) 70 - 99 mg/dL     Physical Findings: AIMS: Facial and Oral Movements Muscles of Facial Expression: None, normal Lips and Perioral Area: None, normal Jaw: None, normal Tongue: None, normal,Extremity Movements Upper (arms, wrists, hands, fingers): None, normal Lower (legs, knees, ankles, toes): None, normal, Trunk Movements Neck, shoulders, hips: None, normal, Overall Severity Severity of abnormal movements (highest score from questions above): None, normal Incapacitation due to abnormal movements: None, normal Patient's awareness of abnormal movements (rate only patient's report): No Awareness, Dental Status Current problems with teeth and/or dentures?: No Does patient usually wear dentures?: No  CIWA:  CIWA-Ar Total:  0  COWS:  COWS Total Score: 0   Treatment Plan Summary: Daily contact with patient to assess and evaluate symptoms and progress in treatment Medication management  Plan: We will increase his Neurontin to 300 mg 4 times daily. Otherwise we will continue his current plan of care.  Florena Kozma 03/14/2012, 11:04 AM

## 2012-03-14 NOTE — Progress Notes (Signed)
D-Patient is out on unit interacting with peers and attending groups with good participation. Is insightful with recovery concepts and feels positive about sobriety.  Rates depression and hopelessness 8. "Off and on" SI and contracts for safety.  Affect is brighter than described mood.  No physical complaints and no prn medications requested.  A- Support,encouragement and positive feedback given.  POC continued with evaluation of treatment goals.  Medication education done. 15' checks continued for safety.  R- Remains safe on the unit.  Appropriate mood/affect/behavior.

## 2012-03-14 NOTE — Progress Notes (Signed)
Patient ID: Phillip Turner, male   DOB: 03-09-1961, 51 y.o.   MRN: [id number] D)Has been out and about on hall this evening, interacting with staff and peers.  Stated he wanted to take his hs seroquel a little early tonight so that he could go to bed after group, as he was feeling somewhat anxious. Affect is rather flat, sad.  Asking appropriate questions about lasix and K+ levels, voiced concern about swelling.   Went to bed after group, as he said, and has had no other c/o's tonight. A)  Will continue to monitor q 15 minutes for safety. Will continue to offer support and encouragement.  R)  Appreciative, sad.

## 2012-03-15 LAB — GLUCOSE, CAPILLARY: Glucose-Capillary: 185 mg/dL — ABNORMAL HIGH (ref 70–99)

## 2012-03-15 MED ORDER — POTASSIUM CHLORIDE CRYS ER 20 MEQ PO TBCR
20.0000 meq | EXTENDED_RELEASE_TABLET | Freq: Every day | ORAL | Status: DC
  Administered 2012-03-16 – 2012-03-17 (×2): 20 meq via ORAL
  Filled 2012-03-15 (×4): qty 1

## 2012-03-15 MED ORDER — LEVOTHYROXINE SODIUM 175 MCG PO TABS
175.0000 ug | ORAL_TABLET | Freq: Every day | ORAL | Status: DC
  Administered 2012-03-16 – 2012-03-17 (×2): 175 ug via ORAL
  Filled 2012-03-15 (×5): qty 1

## 2012-03-15 MED ORDER — POTASSIUM CHLORIDE CRYS ER 20 MEQ PO TBCR
20.0000 meq | EXTENDED_RELEASE_TABLET | Freq: Two times a day (BID) | ORAL | Status: DC
  Administered 2012-03-15: 20 meq via ORAL
  Filled 2012-03-15 (×5): qty 1

## 2012-03-15 NOTE — Progress Notes (Signed)
Psychoeducational Group Note  Date:  03/15/2012 Time:  0915  Group Topic/Focus:  Spirituality:   The focus of this group is to discuss how one's spirituality can aide in recovery.  Participation Level:  Did Not Attend  Dalia Heading 03/15/2012, 12:03 PM

## 2012-03-15 NOTE — Progress Notes (Signed)
[facility]             Progress Note   Subjective: No complaints. Wants information about his thyroid levels and clarification on his diuretic and potassium dosing.  Objective: Vital signs in last 24 hours: Pulse Rate:  [57-72] 72  (11/10 1101) Resp:  [20] 20  (11/10 1100) BP: (121-178)/(73-91) 178/91 mmHg (11/10 1101) Weight change:  Last BM Date: 03/11/12  Intake/Output from previous day:       Physical Exam: General: Alert, awake, oriented x3, in no acute distress. HEENT: No bruits, no goiter. Heart: Regular rate and rhythm, without murmurs, rubs, gallops. Lungs: Clear to auscultation bilaterally. Abdomen: Soft, nontender, nondistended, positive bowel sounds. Extremities: 1+ bilateral pitting edema. Neuro: Grossly intact, nonfocal.    Lab Results: Basic Metabolic Panel:  Basename 03/14/12 1924 03/13/12 0622 03/12/12 2020  NA 140 139 --  K 3.9 3.6 --  CL 107 106 --  CO2 23 22 --  GLUCOSE 157* 185* --  BUN 9 12 --  CREATININE 1.40* 1.41* --  CALCIUM 8.7 9.0 --  MG -- -- 1.9  PHOS -- -- --   CBG:  Basename 03/15/12 1659 03/15/12 1154 03/15/12 0619 03/14/12 2052 03/14/12 1715 03/14/12 1209  GLUCAP 119* 200* 185* 148* 134* 164*   Thyroid Function Tests:  Basename 03/12/12 2020  TSH 0.170*  T4TOTAL --  FREET4 --  T3FREE --  THYROIDAB --   Urine Drug Screen: Drugs of Abuse     Component Value Date/Time   LABOPIA NONE DETECTED 03/08/2012 1252   COCAINSCRNUR POSITIVE* 03/08/2012 1252   LABBENZ NONE DETECTED 03/08/2012 1252   AMPHETMU NONE DETECTED 03/08/2012 1252   THCU NONE DETECTED 03/08/2012 1252   LABBARB NONE DETECTED 03/08/2012 1252     Studies/Results: No results found.  Medications: Scheduled Meds:   . aspirin  325 mg Oral Daily  . colchicine  0.6 mg Oral Daily  . febuxostat  40 mg Oral Daily  . FLUoxetine  40 mg Oral Daily  . folic acid  1 mg Oral Daily  . furosemide  20 mg Oral QODAY  . gabapentin  300 mg Oral TID WC &  HS  . glyBURIDE  2.5 mg Oral BID WC  . insulin aspart  0-9 Units Subcutaneous TID WC  . levothyroxine  175 mcg Oral QAC breakfast  . lisinopril  10 mg Oral Daily  . multivitamin with minerals  1 tablet Oral Daily  . naproxen  500 mg Oral BID WC  . potassium chloride  20 mEq Oral Daily  . QUEtiapine  150 mg Oral Q2000  . simvastatin  20 mg Oral Daily  . thiamine  100 mg Oral Daily  . [DISCONTINUED] levothyroxine  200 mcg Oral QAC breakfast  . [DISCONTINUED] potassium chloride  20 mEq Oral BID   Continuous Infusions:  PRN Meds:.acetaminophen, albuterol, alum & mag hydroxide-simeth, magnesium hydroxide, polyethylene glycol  Assessment/Plan:  Principal Problem:  *Major depression, recurrent Active Problems:  Hypokalemia  DM (diabetes mellitus), type 2, uncontrolled with complications  Hypothyroidism  Alcohol abuse  Anxiety disorder  HTN (hypertension)   Substance Abuse/Detox/Depression  -As per psychiatry.   Hypokalemia  -Improved with repletion and temporary cessation of diuretics. -Mag was within normal limits and did not require repletion. -Note that QOD 20 mg of lasix was restarted. -Agree with this, but would hold metalazone indefinitely at this point. -Would decrease daily potassium supplementation to 20 meq daily instead of BID given every other day lasix dosing.  Hypothyroidism  -  TSH low. -Will change synthroid from 200 to 175 mcg. -Would advise that his PCP recheck TSH in 4-6 weeks for further dosing adjustments if required.  DM  -Fair control.  -No change to regimen for now.   Disposition -Will sign off. -Please call us back with any questions! -Thank you for allowing [place] to participate in Phillip Turner's care.    Time spent coordinating care: 35 minutes.   LOS: 6 days   [facility] Pager: [phone] 03/15/2012, 5:16 PM

## 2012-03-15 NOTE — Clinical Social Work Note (Signed)
[facility] Group Notes:  (Clinical Social Work)  03/15/2012  10:00-11:00AM  Summary of Progress/Problems:   The main focus of today's process group was for the patient to identify ways to remain safe upon discharge and ways to prevent future hospitalizations.  It was also discussed how people may sabotage their own recovery, and reasons they may do this.  The patient expressed argumentatively that he did not agree with example given of self-sabotage, and came back to this numerous times.  He was able to identify that his refusal to take his medications, even the one(s) for depression, was the beginning of his self-sabotage.  He talked about making plans even before he gets his check of how he is going to "skimp" on paying the bills in order to have money left over for drugs/alcohol.  He identified either (1) taking someone trustworthy with him to pay the bills in full, or (2) letting someone else take over paying the bills as possible means of avoiding this self-sabotage this time.  The patient was in and out of the room, and finally left and stayed out.  Type of Therapy:  Group Therapy  Participation Level:  Active  Participation Quality:  Intrusive, Monopolizing, Resistant and Sharing  Affect:  Irritable  Cognitive:  Alert  Insight:  Limited  Engagement in Group:  Limited  Engagement in Therapy:  Limited  Modes of Intervention:  Clarification, Education, Limit-setting, Problem-solving, Socialization, Support and Processing   Phillip Mantle, LCSW 03/15/2012, 12:54 PM

## 2012-03-15 NOTE — Progress Notes (Signed)
[facility] MD Progress Note  03/15/2012 9:52 AM Phillip Turner  MRN:  [id number]  Diagnosis:   Axis I: Alcohol Abuse, Anxiety Disorder NOS, Major Depression, Recurrent severe and Substance Abuse Axis II: Deferred Axis III:  Past Medical History  Diagnosis Date  . Diabetes mellitus   . Hypertension   . Hypothyroidism   . Anxiety   . Depression   . Stroke     does not remember when   Axis IV: economic problems, occupational problems, other psychosocial or environmental problems, problems related to social environment and problems with primary support group Axis V: 41-50 serious symptoms  ADL's:  Intact  Sleep: Fair  Appetite:  Good  Suicidal Ideation:  Denies Homicidal Ideation:  Denies  Mental Status Examination/Evaluation: Objective:  Appearance: Casual and Neat  Eye Contact::  Good  Speech:  Normal Rate  Volume:  Normal  Mood:  Anxious and Hopeless  Affect:  Congruent  Thought Process:  Goal Directed and Logical  Orientation:  Full  Thought Content:  WDL  Suicidal Thoughts:  No  Homicidal Thoughts:  No  Memory:  Immediate;   Fair Recent;   Fair Remote;   Fair  Judgement:  Impaired  Insight:  Fair  Psychomotor Activity:  Normal  Concentration:  Fair  Recall:  Fair  Akathisia:  No  Handed:  Right  AIMS (if indicated):     Assets:  Communication Skills Desire for Improvement Resilience  Sleep:  Number of Hours: 5.5    Vital Signs:Blood pressure 177/80, pulse 64, temperature 97.7 F (36.5 C), temperature source Oral, resp. rate 20, height 5\' 10"  (1.778 m), weight 140.615 kg (310 lb). Current Medications: Current Facility-Administered Medications  Medication Dose Route Frequency Provider Last Rate Last Dose  . acetaminophen (TYLENOL) tablet 650 mg  650 mg Oral Q6H PRN Verne Spurr, PA-C   650 mg at 03/09/12 2237  . albuterol (PROVENTIL) (5 MG/ML) 0.5% nebulizer solution 2.5 mg  2.5 mg Nebulization Q6H PRN Verne Spurr, PA-C      . alum & mag  hydroxide-simeth (MAALOX/MYLANTA) 200-200-20 MG/5ML suspension 30 mL  30 mL Oral Q4H PRN Verne Spurr, PA-C      . aspirin tablet 325 mg  325 mg Oral Daily Verne Spurr, PA-C   325 mg at 03/15/12 0841  . colchicine tablet 0.6 mg  0.6 mg Oral Daily Rachael Fee, MD   0.6 mg at 03/15/12 0841  . febuxostat (ULORIC) tablet 40 mg  40 mg Oral Daily Rachael Fee, MD   40 mg at 03/15/12 0841  . FLUoxetine (PROZAC) capsule 40 mg  40 mg Oral Daily Rachael Fee, MD   40 mg at 03/15/12 0840  . folic acid (FOLVITE) tablet 1 mg  1 mg Oral Daily Rachael Fee, MD   1 mg at 03/15/12 0841  . furosemide (LASIX) tablet 20 mg  20 mg Oral QODAY Rachael Fee, MD   20 mg at 03/15/12 0841  . gabapentin (NEURONTIN) capsule 300 mg  300 mg Oral TID WC & HS Jorje Guild, PA-C   300 mg at 03/15/12 0657  . glyBURIDE (DIABETA) tablet 2.5 mg  2.5 mg Oral BID WC Rachael Fee, MD   2.5 mg at 03/15/12 0841  . insulin aspart (novoLOG) injection 0-9 Units  0-9 Units Subcutaneous TID WC Verne Spurr, PA-C   2 Units at 03/15/12 0656  . levothyroxine (SYNTHROID, LEVOTHROID) tablet 200 mcg  200 mcg Oral QAC breakfast Verne Spurr, PA-C   200  mcg at 03/15/12 0652  . lisinopril (PRINIVIL,ZESTRIL) tablet 10 mg  10 mg Oral Daily Verne Spurr, PA-C   10 mg at 03/15/12 0841  . magnesium hydroxide (MILK OF MAGNESIA) suspension 30 mL  30 mL Oral Daily PRN Verne Spurr, PA-C      . multivitamin with minerals tablet 1 tablet  1 tablet Oral Daily Verne Spurr, PA-C   1 tablet at 03/15/12 0840  . naproxen (NAPROSYN) tablet 500 mg  500 mg Oral BID WC Verne Spurr, PA-C   500 mg at 03/15/12 0841  . polyethylene glycol (MIRALAX / GLYCOLAX) packet 17 g  17 g Oral Daily PRN Verne Spurr, PA-C      . potassium chloride SA (K-DUR,KLOR-CON) CR tablet 20 mEq  20 mEq Oral BID Nanine Means, NP      . QUEtiapine (SEROQUEL) tablet 150 mg  150 mg Oral Q2000 Mike Craze, MD   150 mg at 03/14/12 2054  . simvastatin (ZOCOR) tablet 20 mg  20 mg Oral  Daily Rachael Fee, MD   20 mg at 03/15/12 0841  . thiamine (VITAMIN B-1) tablet 100 mg  100 mg Oral Daily Verne Spurr, PA-C   100 mg at 03/15/12 0840  . [DISCONTINUED] gabapentin (NEURONTIN) capsule 300 mg  300 mg Oral TID Rachael Fee, MD   300 mg at 03/14/12 0919  . [DISCONTINUED] QUEtiapine (SEROQUEL) tablet 150 mg  150 mg Oral QHS Rachael Fee, MD   150 mg at 03/13/12 2011    Lab Results:  Results for orders placed during the hospital encounter of 03/09/12 (from the past 48 hour(s))  GLUCOSE, CAPILLARY     Status: Abnormal   Collection Time   03/13/12 12:03 PM      Component Value Range Comment   Glucose-Capillary 157 (*) 70 - 99 mg/dL   GLUCOSE, CAPILLARY     Status: Abnormal   Collection Time   03/13/12  5:07 PM      Component Value Range Comment   Glucose-Capillary 336 (*) 70 - 99 mg/dL   GLUCOSE, CAPILLARY     Status: Abnormal   Collection Time   03/13/12  9:19 PM      Component Value Range Comment   Glucose-Capillary 171 (*) 70 - 99 mg/dL   GLUCOSE, CAPILLARY     Status: Abnormal   Collection Time   03/14/12  6:55 AM      Component Value Range Comment   Glucose-Capillary 188 (*) 70 - 99 mg/dL   GLUCOSE, CAPILLARY     Status: Abnormal   Collection Time   03/14/12 12:09 PM      Component Value Range Comment   Glucose-Capillary 164 (*) 70 - 99 mg/dL   GLUCOSE, CAPILLARY     Status: Abnormal   Collection Time   03/14/12  5:15 PM      Component Value Range Comment   Glucose-Capillary 134 (*) 70 - 99 mg/dL   BASIC METABOLIC PANEL     Status: Abnormal   Collection Time   03/14/12  7:24 PM      Component Value Range Comment   Sodium 140  135 - 145 mEq/L    Potassium 3.9  3.5 - 5.1 mEq/L    Chloride 107  96 - 112 mEq/L    CO2 23  19 - 32 mEq/L    Glucose, Bld 157 (*) 70 - 99 mg/dL    BUN 9  6 - 23 mg/dL    Creatinine, Ser 4.09 (*)  0.50 - 1.35 mg/dL    Calcium 8.7  8.4 - 13.0 mg/dL    GFR calc non Af Amer 57 (*) >90 mL/min    GFR calc Af Amer 66 (*) >90 mL/min     GLUCOSE, CAPILLARY     Status: Abnormal   Collection Time   03/14/12  8:52 PM      Component Value Range Comment   Glucose-Capillary 148 (*) 70 - 99 mg/dL    Comment 1 Notify RN      Comment 2 Documented in Chart     GLUCOSE, CAPILLARY     Status: Abnormal   Collection Time   03/15/12  6:19 AM      Component Value Range Comment   Glucose-Capillary 185 (*) 70 - 99 mg/dL     Physical Findings: AIMS: Facial and Oral Movements Muscles of Facial Expression: None, normal Lips and Perioral Area: None, normal Jaw: None, normal Tongue: None, normal,Extremity Movements Upper (arms, wrists, hands, fingers): None, normal Lower (legs, knees, ankles, toes): None, normal, Trunk Movements Neck, shoulders, hips: None, normal, Overall Severity Severity of abnormal movements (highest score from questions above): None, normal Incapacitation due to abnormal movements: None, normal Patient's awareness of abnormal movements (rate only patient's report): No Awareness, Dental Status Current problems with teeth and/or dentures?: No Does patient usually wear dentures?: No  CIWA:  CIWA-Ar Total: 0  COWS:  COWS Total Score: 0   Treatment Plan Summary: Daily contact with patient to assess and evaluate symptoms and progress in treatment Medication management  Plan: Individual and group therapy, medication management for detox and medical issues, patient requested to restart his potassium medication since he restarted Lasix yesterday--he reported being on 40 mEq TID at home but due to his last K+ level of 3.9-20 mEq BID ordered.  Pt was also concerned about his TSH level being low but due to his T4 and T3 levels WNL will continue with his current (and home) medication regiment--no change.  Patient denies suicidal/homicidal thoughts, no auditory or visual hallucinations noted.  Monitoring continues.   Nanine Means, NP 03/15/2012, 9:52 AM

## 2012-03-15 NOTE — Progress Notes (Signed)
Patient ID: Phillip Turner, male   DOB: June 25, 1960, 51 y.o.   MRN: [id number] D)  Seems to be in good spirits this evening, smiling, positive, feels has gained a lot from group today, states he gets perspectives on issues he had never thought of,  Feels he has grown.  A)   Encouraged him to find community support and resources when he leaves and to continue the growth and to share what he has been learning.  Will continue to monitor for safety.  R)  Receptive,

## 2012-03-15 NOTE — Progress Notes (Signed)
Agree with assessment and plan Jenille Laszlo A. Cabella Kimm, M.D. 

## 2012-03-15 NOTE — Progress Notes (Signed)
Patient did attend the evening speaker AA meeting.  

## 2012-03-15 NOTE — Progress Notes (Signed)
Psychoeducational Group Note  Date:  03/15/2012 Time:  1515  Group Topic/Focus:  Making Healthy Choices:   The focus of this group is to help patients identify negative/unhealthy choices they were using prior to admission and identify positive/healthier coping strategies to replace them upon discharge.  Participation Level:  Active  Participation Quality:  Appropriate, Intrusive and Monopolizing  Affect:  Angry and Irritable  Cognitive:  Appropriate  Insight:  Limited  Engagement in Group:  Good  Additional Comments:  Focus of this group was to discuss gratitude and what it means to each pt. Pt did not state what he was grateful for but rather a negative part of society and why he believes other people are ungrateful, and how they portray ungratefulness through their actions. Pt appeared agitated while speaking and was intrusive during group.    Dalia Heading 03/15/2012, 7:51 PM

## 2012-03-15 NOTE — Progress Notes (Signed)
D- Patient is needy and intrusive this shift. Focused on medications. Requesting multiple personal items.  1200 insulin given then he stated he "wasn't going to eat ". In dayroom eating an apple shortly after. Offered lunch tray.  Rates depression at 10 and hopelessness at 6.  Denies SI. A- Support and compassionate redirection given.  Continue POC and evaluation of treatment goals. Continue 15' checks for safety. R- Remains safe on the unit.

## 2012-03-15 NOTE — Progress Notes (Signed)
Psychoeducational Group Note  Date: 03/15/2012  Time: 1315  Group Topic/Focus:  Healthy Support Systems  Participation Level: Active  Participation Quality: Appropriate, Attentive and Sharing  Affect: Appropriate  Cognitive: Alert and Appropriate  Insight: Good  Engagement in Group: Good   

## 2012-03-15 NOTE — Progress Notes (Signed)
I agree with this note.  

## 2012-03-16 DIAGNOSIS — F331 Major depressive disorder, recurrent, moderate: Secondary | ICD-10-CM

## 2012-03-16 LAB — T3, FREE: T3, Free: 3.1 pg/mL (ref 2.3–4.2)

## 2012-03-16 LAB — GLUCOSE, CAPILLARY
Glucose-Capillary: 170 mg/dL — ABNORMAL HIGH (ref 70–99)
Glucose-Capillary: 111 mg/dL — ABNORMAL HIGH (ref 70–99)
Glucose-Capillary: 107 mg/dL — ABNORMAL HIGH (ref 70–99)

## 2012-03-16 NOTE — Progress Notes (Signed)
[facility] MD Progress Note  03/16/2012 12:38 PM Phillip Turner  MRN:  [id number]  Diagnosis:   Axis I: Major Depression, moderate, recurrent, Alcohol Abuse Axis II: Deferred Axis III:  Past Medical History  Diagnosis Date  . Diabetes mellitus   . Hypertension   . Hypothyroidism   . Anxiety   . Depression   . Stroke     does not remember when   Axis IV: problems with primary support group Axis V: 51-60 moderate symptoms  ADL's:  Intact  Sleep: Fair  Appetite:  Good  Suicidal Ideation:  Plan:  Denies Intent:  Denies Means:  Denies Homicidal Ideation:  Plan:  Denies Intent:  Denies Means:  Denies  Had a good visit with his family this weekend. Feels better than when he came in. His mood is improved. He is dealing with gout pain.   Mental Status Examination/Evaluation: Objective:  Appearance: Fairly Groomed  Patent attorney::  Fair  Speech:  Normal Rate  Volume:  Normal  Mood:  "feeling better"  Affect:  Restricted  Thought Process:  Coherent and Goal Directed  Orientation:  Full  Thought Content:  WDL  Suicidal Thoughts:  No  Homicidal Thoughts:  No  Memory:  Immediate;   Fair Recent;   Fair Remote;   Fair  Judgement:  Fair  Insight:  Present  Psychomotor Activity:  Normal  Concentration:  Fair  Recall:  Fair  Akathisia:  No  Handed:  Right  AIMS (if indicated):     Assets:  Communication Skills Desire for Improvement  Sleep:  Number of Hours: 4.75    Vital Signs:Blood pressure 179/91, pulse 60, temperature 96.4 F (35.8 C), temperature source Oral, resp. rate 24, height 5\' 10"  (1.778 m), weight 140.615 kg (310 lb). Current Medications: Current Facility-Administered Medications  Medication Dose Route Frequency Provider Last Rate Last Dose  . acetaminophen (TYLENOL) tablet 650 mg  650 mg Oral Q6H PRN Verne Spurr, PA-C   650 mg at 03/09/12 2237  . albuterol (PROVENTIL) (5 MG/ML) 0.5% nebulizer solution 2.5 mg  2.5 mg Nebulization Q6H PRN Verne Spurr, PA-C    2.5 mg at 03/16/12 0946  . alum & mag hydroxide-simeth (MAALOX/MYLANTA) 200-200-20 MG/5ML suspension 30 mL  30 mL Oral Q4H PRN Verne Spurr, PA-C      . aspirin tablet 325 mg  325 mg Oral Daily Verne Spurr, PA-C   325 mg at 03/16/12 0841  . colchicine tablet 0.6 mg  0.6 mg Oral Daily Rachael Fee, MD   0.6 mg at 03/16/12 0841  . febuxostat (ULORIC) tablet 40 mg  40 mg Oral Daily Rachael Fee, MD   40 mg at 03/16/12 0841  . FLUoxetine (PROZAC) capsule 40 mg  40 mg Oral Daily Rachael Fee, MD   40 mg at 03/16/12 0841  . folic acid (FOLVITE) tablet 1 mg  1 mg Oral Daily Rachael Fee, MD   1 mg at 03/16/12 0841  . furosemide (LASIX) tablet 20 mg  20 mg Oral QODAY Rachael Fee, MD   20 mg at 03/15/12 0841  . gabapentin (NEURONTIN) capsule 300 mg  300 mg Oral TID WC & HS Jorje Guild, PA-C   300 mg at 03/16/12 1154  . glyBURIDE (DIABETA) tablet 2.5 mg  2.5 mg Oral BID WC Rachael Fee, MD   2.5 mg at 03/15/12 1702  . insulin aspart (novoLOG) injection 0-9 Units  0-9 Units Subcutaneous TID WC Verne Spurr, PA-C   2 Units  at 03/16/12 1218  . levothyroxine (SYNTHROID, LEVOTHROID) tablet 175 mcg  175 mcg Oral QAC breakfast Henderson Cloud, MD   175 mcg at 03/16/12 0941  . lisinopril (PRINIVIL,ZESTRIL) tablet 10 mg  10 mg Oral Daily Verne Spurr, PA-C   10 mg at 03/16/12 0842  . magnesium hydroxide (MILK OF MAGNESIA) suspension 30 mL  30 mL Oral Daily PRN Verne Spurr, PA-C      . multivitamin with minerals tablet 1 tablet  1 tablet Oral Daily Verne Spurr, PA-C   1 tablet at 03/16/12 0841  . naproxen (NAPROSYN) tablet 500 mg  500 mg Oral BID WC Verne Spurr, PA-C   500 mg at 03/16/12 0841  . polyethylene glycol (MIRALAX / GLYCOLAX) packet 17 g  17 g Oral Daily PRN Verne Spurr, PA-C      . potassium chloride SA (K-DUR,KLOR-CON) CR tablet 20 mEq  20 mEq Oral Daily Estela Isaiah Blakes, MD   20 mEq at 03/16/12 0841  . QUEtiapine (SEROQUEL) tablet 150 mg  150 mg Oral Q2000 Mike Craze, MD   150 mg at 03/15/12 1957  . simvastatin (ZOCOR) tablet 20 mg  20 mg Oral Daily Rachael Fee, MD   20 mg at 03/16/12 0842  . thiamine (VITAMIN B-1) tablet 100 mg  100 mg Oral Daily Verne Spurr, PA-C   100 mg at 03/16/12 1610  . [DISCONTINUED] levothyroxine (SYNTHROID, LEVOTHROID) tablet 200 mcg  200 mcg Oral QAC breakfast Verne Spurr, PA-C   200 mcg at 03/15/12 9604  . [DISCONTINUED] potassium chloride SA (K-DUR,KLOR-CON) CR tablet 20 mEq  20 mEq Oral BID Nanine Means, NP   20 mEq at 03/15/12 1218    Lab Results:  Results for orders placed during the hospital encounter of 03/09/12 (from the past 48 hour(s))  GLUCOSE, CAPILLARY     Status: Abnormal   Collection Time   03/14/12  5:15 PM      Component Value Range Comment   Glucose-Capillary 134 (*) 70 - 99 mg/dL   BASIC METABOLIC PANEL     Status: Abnormal   Collection Time   03/14/12  7:24 PM      Component Value Range Comment   Sodium 140  135 - 145 mEq/L    Potassium 3.9  3.5 - 5.1 mEq/L    Chloride 107  96 - 112 mEq/L    CO2 23  19 - 32 mEq/L    Glucose, Bld 157 (*) 70 - 99 mg/dL    BUN 9  6 - 23 mg/dL    Creatinine, Ser 5.40 (*) 0.50 - 1.35 mg/dL    Calcium 8.7  8.4 - 98.1 mg/dL    GFR calc non Af Amer 57 (*) >90 mL/min    GFR calc Af Amer 66 (*) >90 mL/min   GLUCOSE, CAPILLARY     Status: Abnormal   Collection Time   03/14/12  8:52 PM      Component Value Range Comment   Glucose-Capillary 148 (*) 70 - 99 mg/dL    Comment 1 Notify RN      Comment 2 Documented in Chart     GLUCOSE, CAPILLARY     Status: Abnormal   Collection Time   03/15/12  6:19 AM      Component Value Range Comment   Glucose-Capillary 185 (*) 70 - 99 mg/dL   GLUCOSE, CAPILLARY     Status: Abnormal   Collection Time   03/15/12 11:54 AM  Component Value Range Comment   Glucose-Capillary 200 (*) 70 - 99 mg/dL   GLUCOSE, CAPILLARY     Status: Abnormal   Collection Time   03/15/12  4:59 PM      Component Value Range Comment    Glucose-Capillary 119 (*) 70 - 99 mg/dL   T3, FREE     Status: Normal   Collection Time   03/15/12  7:27 PM      Component Value Range Comment   T3, Free 3.1  2.3 - 4.2 pg/mL   T4, FREE     Status: Normal   Collection Time   03/15/12  7:27 PM      Component Value Range Comment   Free T4 1.24  0.80 - 1.80 ng/dL   GLUCOSE, CAPILLARY     Status: Abnormal   Collection Time   03/15/12  9:19 PM      Component Value Range Comment   Glucose-Capillary 175 (*) 70 - 99 mg/dL    Comment 1 Notify RN     GLUCOSE, CAPILLARY     Status: Abnormal   Collection Time   03/16/12  5:54 AM      Component Value Range Comment   Glucose-Capillary 158 (*) 70 - 99 mg/dL    Comment 1 Notify RN     GLUCOSE, CAPILLARY     Status: Abnormal   Collection Time   03/16/12 12:15 PM      Component Value Range Comment   Glucose-Capillary 170 (*) 70 - 99 mg/dL     Physical Findings: AIMS: Facial and Oral Movements Muscles of Facial Expression: None, normal Lips and Perioral Area: None, normal Jaw: None, normal Tongue: None, normal,Extremity Movements Upper (arms, wrists, hands, fingers): None, normal Lower (legs, knees, ankles, toes): None, normal, Trunk Movements Neck, shoulders, hips: None, normal, Overall Severity Severity of abnormal movements (highest score from questions above): None, normal Incapacitation due to abnormal movements: None, normal Patient's awareness of abnormal movements (rate only patient's report): No Awareness, Dental Status Current problems with teeth and/or dentures?: No Does patient usually wear dentures?: No  CIWA:  CIWA-Ar Total: 0  COWS:  COWS Total Score: 0   Treatment Plan Summary: Daily contact with patient to assess and evaluate symptoms and progress in treatment Medication management  Plan: Supportive approach/coping skills/relapse prevention           Continue actual treatment plan           No changes in medications indicated           Level of Potassium is holding  steady  Kylii Ennis A 03/16/2012, 12:38 PM

## 2012-03-16 NOTE — Tx Team (Signed)
Interdisciplinary Treatment Plan Update (Adult)  Date:  03/16/2012  Time Reviewed:  11:48 AM   Progress in Treatment: Attending groups: Yes Participating in groups:  Yes Taking medication as prescribed: Yes Tolerating medication:  Yes Family/Significant othe contact made:  Yes Patient understands diagnosis:  Yes Discussing patient identified problems/goals with staff:  Yes Medical problems stabilized or resolved:  Yes Denies suicidal/homicidal ideation: Yes Issues/concerns per patient self-inventory:  None identified Other: N/A  New problem(s) identified: None Identified  Reason for Continuation of Hospitalization: Anxiety Depression Medication stabilization  Interventions implemented related to continuation of hospitalization: mood stabilization, medication monitoring and adjustment, group therapy and psycho education, safety checks q 15 mins  Additional comments: N/A  Estimated length of stay: 1-2 days  Discharge Plan: Pt will follow up at [facility] for medication management and therapy.   New goal(s): N/A  Review of initial/current patient goals per problem list:    1.  Goal(s): Address substance use  Met:  Yes  Target date: by discharge  As evidenced by: completing detox protocol and refer to appropriate treatment  2.  Goal (s): Reduce depressive and anxiety symptoms  Met:  Yes  Target date: by discharge  As evidenced by: Reducing depression from a 10 to a 3 as reported by pt.  Pt rates both at a 3 today.    3.  Goal(s): Eliminate SI  Met:  Yes  Target date: by discharge  As evidenced by: Pt denying SI.  Pt has denied SI for more than 48 hours.     Attendees: Patient:     Family:     Physician: Geoffery Lyons, MD 03/16/2012 11:48 AM   Nursing: Rosemarie Beath, RN 03/16/2012 11:48 AM   Clinical Social Worker:  Reyes Ivan, LCSWA 03/16/2012  11:48 AM   Other: Nanine Means, NP 03/16/2012  11:48 AM   Other:  Bubba Camp, Psyc intern 03/16/2012 11:49 AM    Other:     Other:     Other:      Scribe for Treatment Team:   Reyes Ivan 03/16/2012 11:48 AM

## 2012-03-16 NOTE — Progress Notes (Signed)
[facility] Group Notes:  (Counselor/Nursing/MHT/Case Management/Adjunct)  03/16/2012 9:32 PM  Type of Therapy:  Psychoeducational Skills  Participation Level:  Did Not Attend  Participation Quality:     Affect:    Cognitive:    Insight:    Engagement in Group:    Engagement in Therapy:    Modes of Intervention:    Summary of Progress/Problems:   Adelina Mings 03/16/2012, 9:32 PM

## 2012-03-16 NOTE — Progress Notes (Signed)
D: Patient has been irritable, agitated and manipulative towards staff. Patient denies pain and show no s/s of distress. Patient denies SI/HI/AVH. Patient c/o of difficulty breathing and received prn tx. Patient appears depressed, anxious and can be sarcastic at times during interactions between staff and patient. A: Medications administered as ordered per MD. Verbal support given. 15 minute checks performed routinely for safety. Patient encouraged to attend groups. Patient breathing reassessed.  R: Agitated. Irritable. Anxious. Minimal interaction on milieu. Patient breathing stable.

## 2012-03-16 NOTE — Social Work (Signed)
Aftercare Planning Group: 03/16/2012 9:45 AM  Pt attended discharge planning group and actively participated in group.  CSW provided pt with today's workbook.  Pt presents with calm mood and affect.  Pt rates depression at a 0 and anxiety at a 3 today.  Pt denies SI/HI.  Pt states that he feels much better and has a positive outlook on his life now.  Pt states that he doesn't want to go to [facility] anymore for further treatment.  Pt wants to go to [facility] for outpatient services.  CSW secure pt's follow up there.  No further needs voiced by pt at this time.  Safety planning and suicide prevention discussed.  Pt participated in discussion and acknowledged an understanding of the information provided.       [facility] Group Note : Clinical Social Worker Group Therapy  03/16/2012  1:15 PM  Type of Therapy:  Group Therapy  Participation Level:  Appropriate  Participation Quality:  Appropriate   Affect:  Appropriate  Cognitive:  Alert  Insight:  Good  Engagement in Group:  Good  Engagement in Therapy:  Good  Modes of Intervention:  Clarification, Education, Problem-solving, Socialization and Support  Summary of Progress/Problems: The topic for group today was overcoming obstacles.  Pt discussed overcoming obstacles and what this means for pt. Pt states that he has learned a lot from his stay here and is developing ways to overcome his addiction.     Sharla Tankard Horton, LCSWA 03/16/2012 3:00 pm

## 2012-03-17 DIAGNOSIS — F339 Major depressive disorder, recurrent, unspecified: Secondary | ICD-10-CM

## 2012-03-17 LAB — GLUCOSE, CAPILLARY
Glucose-Capillary: 119 mg/dL — ABNORMAL HIGH (ref 70–99)
Glucose-Capillary: 143 mg/dL — ABNORMAL HIGH (ref 70–99)

## 2012-03-17 MED ORDER — METFORMIN HCL 500 MG PO TABS: 1000.0000 mg | ORAL_TABLET | Freq: Every day | ORAL | Status: DC

## 2012-03-17 MED ORDER — FEBUXOSTAT 40 MG PO TABS: 40.0000 mg | ORAL_TABLET | Freq: Every day | ORAL | Status: DC

## 2012-03-17 MED ORDER — ASPIRIN 325 MG PO TABS: 325.0000 mg | ORAL_TABLET | Freq: Every day | ORAL | Status: DC

## 2012-03-17 MED ORDER — LISINOPRIL 10 MG PO TABS: 10.0000 mg | ORAL_TABLET | Freq: Every day | ORAL | Status: DC

## 2012-03-17 MED ORDER — GABAPENTIN 300 MG PO CAPS: 300.0000 mg | ORAL_CAPSULE | Freq: Three times a day (TID) | ORAL | Status: DC

## 2012-03-17 MED ORDER — FUROSEMIDE 20 MG PO TABS: 20.0000 mg | ORAL_TABLET | Freq: Every day | ORAL | Status: DC

## 2012-03-17 MED ORDER — SIMVASTATIN 20 MG PO TABS: 20.0000 mg | ORAL_TABLET | Freq: Every day | ORAL | Status: DC

## 2012-03-17 MED ORDER — QUETIAPINE FUMARATE 50 MG PO TABS: 150.0000 mg | ORAL_TABLET | Freq: Every day | ORAL | Status: DC

## 2012-03-17 MED ORDER — FUROSEMIDE 20 MG PO TABS: 20.0000 mg | ORAL_TABLET | ORAL | Status: DC

## 2012-03-17 MED ORDER — POTASSIUM CHLORIDE CRYS ER 20 MEQ PO TBCR: 20.0000 meq | EXTENDED_RELEASE_TABLET | Freq: Every day | ORAL | Status: DC

## 2012-03-17 MED ORDER — THIAMINE HCL 100 MG PO TABS: 100.0000 mg | ORAL_TABLET | Freq: Every day | ORAL | Status: DC

## 2012-03-17 MED ORDER — FLUOXETINE HCL 40 MG PO CAPS: 40.0000 mg | ORAL_CAPSULE | Freq: Every day | ORAL | Status: DC

## 2012-03-17 MED ORDER — FOLIC ACID 1 MG PO TABS: 1.0000 mg | ORAL_TABLET | Freq: Every day | ORAL | Status: DC

## 2012-03-17 MED ORDER — GLYBURIDE 2.5 MG PO TABS: 2.5000 mg | ORAL_TABLET | Freq: Two times a day (BID) | ORAL | Status: DC

## 2012-03-17 MED ORDER — NAPROXEN 500 MG PO TABS: 500.0000 mg | ORAL_TABLET | Freq: Two times a day (BID) | ORAL | Status: DC

## 2012-03-17 MED ORDER — LEVOTHYROXINE SODIUM 175 MCG PO TABS: 175.0000 ug | ORAL_TABLET | Freq: Every day | ORAL | Status: DC

## 2012-03-17 NOTE — Progress Notes (Signed)
[facility] Group Notes:  (Counselor/Nursing/MHT/Case Management/Adjunct)  03/17/2012 5:29 PM  Type of Therapy:  Psychoeducational Skills  Participation Level:  Active  Participation Quality:  Appropriate, Attentive, Monopolizing and Redirectable  Affect:  Appropriate and Excited  Cognitive:  Alert, Appropriate and Oriented  Insight:  Good  Engagement in Group:  Good  Engagement in Therapy:  n/a  Modes of Intervention:  Activity, Education, Problem-solving, Socialization and Support  Summary of Progress/Problems: Nyzaiah attended a psychoeducational group on labels. 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 Naresh was active while group discussed what labels are, how they change the way we think about and perceive the world, and listed positive and negative labels they have used or been called. Tauheed was given a homework assignment to list 10 words he has been labeled and to find the reality of that situation/label.    Wandra Scot 03/17/2012, 5:29 PM

## 2012-03-17 NOTE — Progress Notes (Signed)
Patient ID: Phillip Turner, male   DOB: April 16, 1961, 51 y.o.   MRN: [id number] He has been discharged home. He was picked up by his wife. He voiced understanding of discharge instruction and of follow up. He stated that he would see his primary physician  as soon as possible for his B/P elevation. He dinies thoughts of SI and all belongings taken home with him.

## 2012-03-17 NOTE — Progress Notes (Signed)
Patient ID: Phillip Turner, male   DOB: 10/05/1960, 51 y.o.   MRN: [id number] He has been up and about and to groups, interacting with peers and staff. Has not  C/o any discomfort. His Self inventory  Today was all 0,s. His B/P at 10:15 was 177/98 p 53 sitting and standing 186/97 p 58.  NP is aware and b/p is being monitored.  Stated that he wanted to leave today.

## 2012-03-17 NOTE — Discharge Summary (Signed)
Physician Discharge Summary Note  Patient:  Phillip Turner is an 51 y.o., male MRN:  [id number] DOB:  1960-05-31 Patient phone:  [phone] (home)  Patient address:   [address] [place] [address],   Date of Admission:  03/09/2012 Date of Discharge: 03/17/2012  Reason for Admission:  Depression and alcohol abuse  Discharge Diagnoses: Principal Problem:  *Major depression, recurrent Active Problems:  Depression  DM (diabetes mellitus), type 2, uncontrolled with complications  Hypothyroidism  Anxiety disorder  HTN (hypertension)   Axis Diagnosis:   AXIS I:  Alcohol Abuse, Anxiety Disorder NOS, Major Depression, Recurrent severe and Substance Abuse AXIS II:  Deferred AXIS III:   Past Medical History  Diagnosis Date  . Diabetes mellitus   . Hypertension   . Hypothyroidism   . Anxiety   . Depression   . Stroke     does not remember when   AXIS IV:  other psychosocial or environmental problems, problems related to social environment and problems with primary support group AXIS V:  61-70 mild symptoms  Level of Care:  OP  Hospital Course:   Patient attended individual and group therapy while inpatient along with attending AA groups, one-one time with MD daily, medications for detox managed during inpatient, follow-up appointments made prior to discharge   Consults:  cardiology  Significant Diagnostic Studies:  labs: Completed and reviewed in ED, stable  Discharge Vitals:   Blood pressure 178/95, pulse 51, temperature 97.1 F (36.2 C), temperature source Oral, resp. rate 22, height 5\' 10"  (1.778 m), weight 140.615 kg (310 lb). Lab Results:   Results for orders placed during the hospital encounter of 03/09/12 (from the past 72 hour(s))  GLUCOSE, CAPILLARY     Status: Abnormal   Collection Time   03/14/12 12:09 PM      Component Value Range Comment   Glucose-Capillary 164 (*) 70 - 99 mg/dL   GLUCOSE, CAPILLARY     Status: Abnormal   Collection Time   03/14/12  5:15 PM      Component Value Range Comment   Glucose-Capillary 134 (*) 70 - 99 mg/dL   BASIC METABOLIC PANEL     Status: Abnormal   Collection Time   03/14/12  7:24 PM      Component Value Range Comment   Sodium 140  135 - 145 mEq/L    Potassium 3.9  3.5 - 5.1 mEq/L    Chloride 107  96 - 112 mEq/L    CO2 23  19 - 32 mEq/L    Glucose, Bld 157 (*) 70 - 99 mg/dL    BUN 9  6 - 23 mg/dL    Creatinine, Ser 1.02 (*) 0.50 - 1.35 mg/dL    Calcium 8.7  8.4 - 72.5 mg/dL    GFR calc non Af Amer 57 (*) >90 mL/min    GFR calc Af Amer 66 (*) >90 mL/min   GLUCOSE, CAPILLARY     Status: Abnormal   Collection Time   03/14/12  8:52 PM      Component Value Range Comment   Glucose-Capillary 148 (*) 70 - 99 mg/dL    Comment 1 Notify RN      Comment 2 Documented in Chart     GLUCOSE, CAPILLARY     Status: Abnormal   Collection Time   03/15/12  6:19 AM      Component Value Range Comment   Glucose-Capillary 185 (*) 70 - 99 mg/dL   GLUCOSE, CAPILLARY     Status:  Abnormal   Collection Time   03/15/12 11:54 AM      Component Value Range Comment   Glucose-Capillary 200 (*) 70 - 99 mg/dL   GLUCOSE, CAPILLARY     Status: Abnormal   Collection Time   03/15/12  4:59 PM      Component Value Range Comment   Glucose-Capillary 119 (*) 70 - 99 mg/dL   T3, FREE     Status: Normal   Collection Time   03/15/12  7:27 PM      Component Value Range Comment   T3, Free 3.1  2.3 - 4.2 pg/mL   T4, FREE     Status: Normal   Collection Time   03/15/12  7:27 PM      Component Value Range Comment   Free T4 1.24  0.80 - 1.80 ng/dL   GLUCOSE, CAPILLARY     Status: Abnormal   Collection Time   03/15/12  9:19 PM      Component Value Range Comment   Glucose-Capillary 175 (*) 70 - 99 mg/dL    Comment 1 Notify RN     GLUCOSE, CAPILLARY     Status: Abnormal   Collection Time   03/16/12  5:54 AM      Component Value Range Comment   Glucose-Capillary 158 (*) 70 - 99 mg/dL    Comment 1 Notify RN     GLUCOSE,  CAPILLARY     Status: Abnormal   Collection Time   03/16/12 12:15 PM      Component Value Range Comment   Glucose-Capillary 170 (*) 70 - 99 mg/dL   GLUCOSE, CAPILLARY     Status: Abnormal   Collection Time   03/16/12  4:43 PM      Component Value Range Comment   Glucose-Capillary 111 (*) 70 - 99 mg/dL   GLUCOSE, CAPILLARY     Status: Abnormal   Collection Time   03/16/12  9:45 PM      Component Value Range Comment   Glucose-Capillary 107 (*) 70 - 99 mg/dL   GLUCOSE, CAPILLARY     Status: Abnormal   Collection Time   03/17/12  6:06 AM      Component Value Range Comment   Glucose-Capillary 119 (*) 70 - 99 mg/dL     Physical Findings: AIMS: Facial and Oral Movements Muscles of Facial Expression: None, normal Lips and Perioral Area: None, normal Jaw: None, normal Tongue: None, normal,Extremity Movements Upper (arms, wrists, hands, fingers): None, normal Lower (legs, knees, ankles, toes): None, normal, Trunk Movements Neck, shoulders, hips: None, normal, Overall Severity Severity of abnormal movements (highest score from questions above): None, normal Incapacitation due to abnormal movements: None, normal Patient's awareness of abnormal movements (rate only patient's report): No Awareness, Dental Status Current problems with teeth and/or dentures?: No Does patient usually wear dentures?: No  CIWA:  CIWA-Ar Total: 0  COWS:  COWS Total Score: 0   Mental Status Exam: See Mental Status Examination and Suicide Risk Assessment completed by Attending Physician prior to discharge.  Discharge destination:  Home  Is patient on multiple antipsychotic therapies at discharge:  No   Has Patient had three or more failed trials of antipsychotic monotherapy by history:  No Recommended Plan for Multiple Antipsychotic Therapies:  N/A  Discharge Orders    Future Orders Please Complete By Expires   Diet - low sodium heart healthy      Diet Carb Modified      Activity as tolerated - No  restrictions          Medication List     As of 03/17/2012 10:17 AM    STOP taking these medications         escitalopram 10 MG tablet   Commonly known as: LEXAPRO      metolazone 5 MG tablet   Commonly known as: ZAROXOLYN      potassium chloride 20 MEQ packet   Commonly known as: KLOR-CON      TAKE these medications      Indication    albuterol (2.5 MG/3ML) 0.083% nebulizer solution   Commonly known as: PROVENTIL   Take 3 mLs (2.5 mg total) by nebulization every 6 (six) hours as needed for wheezing.       aspirin 325 MG tablet   Take 1 tablet (325 mg total) by mouth daily.    Indication: Prevent clots      colchicine 0.6 MG tablet   Take 1 tablet (0.6 mg total) by mouth daily. Gout Flare       febuxostat 40 MG tablet   Commonly known as: ULORIC   Take 1 tablet (40 mg total) by mouth daily.    Indication: High Amount of Uric Acid in the Blood      FLUoxetine 40 MG capsule   Commonly known as: PROZAC   Take 1 capsule (40 mg total) by mouth daily.    Indication: Depression      folic acid 1 MG tablet   Commonly known as: FOLVITE   Take 1 tablet (1 mg total) by mouth daily.    Indication: Deficiency of Folic Acid in the Diet      furosemide 20 MG tablet   Commonly known as: LASIX   Take 1 tablet (20 mg total) by mouth daily.    Indication: High Blood Pressure      gabapentin 300 MG capsule   Commonly known as: NEURONTIN   Take 1 capsule (300 mg total) by mouth 3 (three) times daily.    Indication: Agitation, Neuropathic Pain      glyBURIDE 2.5 MG tablet   Commonly known as: DIABETA   Take 1 tablet (2.5 mg total) by mouth 2 (two) times daily with a meal.    Indication: Type 2 Diabetes      levothyroxine 175 MCG tablet   Commonly known as: SYNTHROID, LEVOTHROID   Take 1 tablet (175 mcg total) by mouth daily before breakfast.    Indication: Underactive Thyroid      lisinopril 10 MG tablet   Commonly known as: PRINIVIL,ZESTRIL   Take 1 tablet (10 mg  total) by mouth daily.    Indication: High Blood Pressure      metFORMIN 500 MG tablet   Commonly known as: GLUCOPHAGE   Take 2 tablets (1,000 mg total) by mouth daily with breakfast.    Indication: Type 2 Diabetes      naproxen 500 MG tablet   Commonly known as: NAPROSYN   Take 1 tablet (500 mg total) by mouth 2 (two) times daily with a meal.    Indication: Acute Joint Inflammation in Gout      potassium chloride SA 20 MEQ tablet   Commonly known as: K-DUR,KLOR-CON   Take 1 tablet (20 mEq total) by mouth daily.    Indication: Low Amount of Potassium in the Blood      QUEtiapine 50 MG tablet   Commonly known as: SEROQUEL   Take 3 tablets (150 mg total) by mouth daily at  8 pm.    Indication: Trouble Sleeping      simvastatin 20 MG tablet   Commonly known as: ZOCOR   Take 1 tablet (20 mg total) by mouth daily.    Indication: hyperlipidemia      thiamine 100 MG tablet   Take 1 tablet (100 mg total) by mouth daily.    Indication: Deficiency in Thiamine or Vitamin B1           Follow-up Information    Follow up with [facility]. On 03/19/2012. (Walk in on this date at 8:00 am with Dr. August Saucer)    Contact information:   [address], [place] [address] [phone]        Follow-up recommendations:  Activity as tolerated, low sodium heart healthy/carb modified diet  Comments:  Patient denied suicidal/homicidal ideations and auditory/visual hallucinations, follow-up appointments encouraged to attend, outside support groups encouraged and information given, plans to follow-up with his primary care provider for his multiple medical issues and [facility] for his psychiatric issues.  SignedNanine Means, NP 03/17/2012, 10:17 AM

## 2012-03-17 NOTE — [facility] Suicide Risk Assessment (Signed)
Suicide Risk Assessment  Discharge Assessment     Demographic Factors:  NA  Mental Status Per Nursing Assessment::   On Admission:  NA  Current Mental Status by Physician: In full contact with reality. There are no suicidal ideas, plans or intent. He feels much better. His mood is stable, his affect is bright, broad. He feels that he can take from here. Committed to abstinence and to pursue outpatient follow up   Loss Factors: NA  Historical Factors: NA  Risk Reduction Factors:   Responsible for children under 67 years of age, Sense of responsibility to family, Living with another person, especially a relative and Positive social support  Continued Clinical Symptoms:  Depression:   Comorbid alcohol abuse/dependence  Cognitive Features That Contribute To Risk: None Identifed   Suicide Risk:  Minimal: No identifiable suicidal ideation.  Patients presenting with no risk factors but with morbid ruminations; may be classified as minimal risk based on the severity of the depressive symptoms  Discharge Diagnoses:   AXIS I:  Major depression recurrent, Alcohol Dependence AXIS II:  Deferred AXIS III:   Past Medical History  Diagnosis Date  . Diabetes mellitus   . Hypertension   . Hypothyroidism   . Anxiety   . Depression   . Stroke     does not remember when   AXIS IV:  None identified AXIS V:  61-70 mild symptoms  Plan Of Care/Follow-up recommendations: Will pursue follow up with [facility], a therapist and will go back to AA. He is going to pursue further follow up for his blood pressure, potassium diabetes wit his internist   Is patient on multiple antipsychotic therapies at discharge:  No   Has Patient had three or more failed trials of antipsychotic monotherapy by history:  No  Recommended Plan for Multiple Antipsychotic Therapies: N/A   Carles Florea A 03/17/2012, 1:01 PM

## 2012-03-17 NOTE — Tx Team (Signed)
Interdisciplinary Treatment Plan Update (Adult)  Date:  03/17/2012  Time Reviewed:  9:25 AM   Progress in Treatment: Attending groups: Yes Participating in groups:  Yes Taking medication as prescribed: Yes Tolerating medication:  Yes Family/Significant othe contact made:  Yes Patient understands diagnosis:  Yes Discussing patient identified problems/goals with staff:  Yes Medical problems stabilized or resolved:  Yes Denies suicidal/homicidal ideation: Yes Issues/concerns per patient self-inventory:  None identified Other: N/A  New problem(s) identified: None Identified  Reason for Continuation of Hospitalization: Stable to d/c  Interventions implemented related to continuation of hospitalization: Stable to d/c  Additional comments: N/A  Estimated length of stay: D/C today  Discharge Plan: Pt will follow up with [facility] for medication management and therapy.    New goal(s): N/A  Review of initial/current patient goals per problem list:    All goals met  Attendees: Patient:   03/17/2012 9:25 AM   Family:     Physician:  Geoffery Lyons, MD 03/17/2012 9:25 AM   Nursing:  03/17/2012 9:25 AM   Clinical Social Worker:  Reyes Ivan, LCSWA 03/17/2012 9:25 AM   Other:  03/17/2012 9:25 AM   Other:     Other:     Other:     Other:      Scribe for Treatment Team:   Reyes Ivan 03/17/2012 9:25 AM

## 2012-03-17 NOTE — Progress Notes (Signed)
[facility] Case Management Discharge Plan:  Will you be returning to the same living situation after discharge: Yes,  returning home At discharge, do you have transportation home?:Yes,  access to transportation Do you have the ability to pay for your medications:Yes,  access to meds   Release of information consent forms completed and in the chart;  Patient's signature needed at discharge.  Patient to Follow up at:  Follow-up Information    Follow up with [facility]. On 03/19/2012. (Walk in on this date at 8:00 am with Dr. August Saucer)    Contact information:   [address], [place] [address] [phone]         Patient denies SI/HI:   Yes,  denies SI/HI    Safety Planning and Suicide Prevention discussed:  Yes,  discussed with pt  Barrier to discharge identified:No.  Summary and Recommendations: Pt attended discharge planning group and actively participated in group.  CSW provided pt with today's workbook.  Pt presents with calm mood and affect.  Pt rates depression and anxiety at a 0 today.  Pt denies SI/HI.  Pt reports feeling stable to d/c today.  CSW provided pt with a list of AA meetings in the area.  No recommendations from CSW.  No further needs voiced by pt.  Pt stable to discharge.     Carmina Miller 03/17/2012, 9:26 AM

## 2012-03-17 NOTE — Progress Notes (Signed)
D: Pt has been moody tonight. Easily irritated. Responds to questions in a sarcastic manner. Did not attend group therapy. Rates his depression 0/10 and hopelessness 0/10.   A: Support given. Verbalization encouraged. Medication given as ordered. Pt encouraged to talk to nurse about any concerns or complaints. Encouraged to attend groups regularly and increase participation.  R: Pt is receptive. No complaints of pain or discomfort. Q15 min safety checks maintained. Will continue to monitor.

## 2012-03-20 ENCOUNTER — Emergency Department (HOSPITAL_COMMUNITY): Payer: No Typology Code available for payment source

## 2012-03-20 ENCOUNTER — Emergency Department (HOSPITAL_COMMUNITY)
Admission: EM | Admit: 2012-03-20 | Discharge: 2012-03-20 | Disposition: A | Discharge status: Home / Self Care | Payer: No Typology Code available for payment source | Attending: Emergency Medicine | Admitting: Emergency Medicine

## 2012-03-20 ENCOUNTER — Encounter (HOSPITAL_COMMUNITY): Payer: Self-pay | Admitting: Emergency Medicine

## 2012-03-20 DIAGNOSIS — Z8673 Personal history of transient ischemic attack (TIA), and cerebral infarction without residual deficits: Secondary | ICD-10-CM | POA: Insufficient documentation

## 2012-03-20 DIAGNOSIS — Z7982 Long term (current) use of aspirin: Secondary | ICD-10-CM | POA: Insufficient documentation

## 2012-03-20 DIAGNOSIS — I10 Essential (primary) hypertension: Secondary | ICD-10-CM | POA: Insufficient documentation

## 2012-03-20 DIAGNOSIS — E039 Hypothyroidism, unspecified: Secondary | ICD-10-CM | POA: Insufficient documentation

## 2012-03-20 DIAGNOSIS — F8081 Childhood onset fluency disorder: Secondary | ICD-10-CM

## 2012-03-20 DIAGNOSIS — F329 Major depressive disorder, single episode, unspecified: Secondary | ICD-10-CM | POA: Insufficient documentation

## 2012-03-20 DIAGNOSIS — Z79899 Other long term (current) drug therapy: Secondary | ICD-10-CM | POA: Insufficient documentation

## 2012-03-20 DIAGNOSIS — F411 Generalized anxiety disorder: Secondary | ICD-10-CM | POA: Insufficient documentation

## 2012-03-20 DIAGNOSIS — Z87891 Personal history of nicotine dependence: Secondary | ICD-10-CM | POA: Insufficient documentation

## 2012-03-20 DIAGNOSIS — F3289 Other specified depressive episodes: Secondary | ICD-10-CM | POA: Insufficient documentation

## 2012-03-20 DIAGNOSIS — E119 Type 2 diabetes mellitus without complications: Secondary | ICD-10-CM | POA: Insufficient documentation

## 2012-03-20 LAB — CBC
HCT: 35.1 % — ABNORMAL LOW (ref 39.0–52.0)
Hemoglobin: 12.1 g/dL — ABNORMAL LOW (ref 13.0–17.0)
MCH: 27.4 pg (ref 26.0–34.0)
MCHC: 34.5 g/dL (ref 30.0–36.0)
MCV: 79.4 fL (ref 78.0–100.0)
Platelets: 327 10*3/uL (ref 150–400)
RBC: 4.42 MIL/uL (ref 4.22–5.81)
RDW: 18.3 % — ABNORMAL HIGH (ref 11.5–15.5)
WBC: 8 10*3/uL (ref 4.0–10.5)

## 2012-03-20 LAB — BASIC METABOLIC PANEL
BUN: 10 mg/dL (ref 6–23)
CO2: 27 mEq/L (ref 19–32)
Calcium: 9.2 mg/dL (ref 8.4–10.5)
Chloride: 109 mEq/L (ref 96–112)
Creatinine, Ser: 1.27 mg/dL (ref 0.50–1.35)
GFR calc Af Amer: 74 mL/min — ABNORMAL LOW (ref >90)
GFR calc non Af Amer: 64 mL/min — ABNORMAL LOW (ref >90)
Glucose, Bld: 174 mg/dL — ABNORMAL HIGH (ref 70–99)
Potassium: 4 mEq/L (ref 3.5–5.1)
Sodium: 143 mEq/L (ref 135–145)

## 2012-03-20 LAB — ETHANOL: Alcohol, Ethyl (B): <11 mg/dL (ref 0–11)

## 2012-03-20 LAB — URINALYSIS, ROUTINE W REFLEX MICROSCOPIC
Bilirubin Urine: NEGATIVE
Glucose, UA: NEGATIVE mg/dL
Hgb urine dipstick: NEGATIVE
Ketones, ur: NEGATIVE mg/dL
Leukocytes, UA: NEGATIVE
Nitrite: NEGATIVE
Protein, ur: NEGATIVE mg/dL
Specific Gravity, Urine: 1.011 (ref 1.005–1.030)
Urobilinogen, UA: 0.2 mg/dL (ref 0.0–1.0)
pH: 5 (ref 5.0–8.0)

## 2012-03-20 LAB — RAPID URINE DRUG SCREEN, HOSP PERFORMED
Amphetamines: NOT DETECTED
Barbiturates: NOT DETECTED
Benzodiazepines: POSITIVE — AB
Cocaine: NOT DETECTED
Opiates: NOT DETECTED
Tetrahydrocannabinol: NOT DETECTED

## 2012-03-20 LAB — GLUCOSE, CAPILLARY: Glucose-Capillary: 106 mg/dL — ABNORMAL HIGH (ref 70–99)

## 2012-03-20 MED ORDER — ASPIRIN 81 MG PO CHEW
324.0000 mg | CHEWABLE_TABLET | Freq: Once | ORAL | Status: DC
  Filled 2012-03-20: qty 4

## 2012-03-20 NOTE — ED Notes (Addendum)
Pt in from home c/o confusion.  Pt reports that he has been confused since Tuesday along with difficulty speaking. Pt is A&Ox 3, NAD. Pt also adds that bilateral feet and legs have been swollen x1 weeks. Pt denies SOB

## 2012-03-20 NOTE — Progress Notes (Signed)
Patient Discharge Instructions:  After Visit Summary (AVS):   Faxed to:  03/20/12 Psychiatric Admission Assessment Note:   Faxed to:  03/20/12 Suicide Risk Assessment - Discharge Assessment:   Faxed to:  03/20/12 Faxed/Sent to the Next Level Care provider:  03/20/12 Faxed to [facility] @ [phone]  Jerelene Redden, 03/20/2012, 4:19 PM

## 2012-03-20 NOTE — ED Notes (Signed)
MD at bedside. 

## 2012-03-20 NOTE — ED Notes (Addendum)
Pt appears more sleepy that when he arrived. Pt states that he's "having trouble staying awake." Pt CBG 106. [place] notified

## 2012-03-20 NOTE — ED Provider Notes (Signed)
History    51 year old male presenting with stuttering and hesitant speech. Onset was approximately 3 days ago. Symptoms have been stable since then. Patient also reports that R side of his face has been swollen intermittently for past couple days. No weakness or numbness. This is currently resolved. Patient reports prior history of stroke and says had stuttering then which resolved over time. With recent hospitalization for hypokalemia and psychiatric reasons. When discharged was started on Celexa, seroquel and Neurontin. No other new medications that he is aware of.   CSN: [id number]  Arrival date & time 03/20/12  1478   First MD Initiated Contact with Patient 03/20/12 [phone]      Chief Complaint  Patient presents with  . Altered Mental Status    (Consider location/radiation/quality/duration/timing/severity/associated sxs/prior treatment) HPI  Past Medical History  Diagnosis Date  . Diabetes mellitus   . Hypertension   . Hypothyroidism   . Anxiety   . Depression   . Stroke     does not remember when    Past Surgical History  Procedure Date  . Tonsillectomy     Family History  Problem Relation Age of Onset  . Coronary artery disease Mother   . Diabetes type II Father   . Hypertension Father     History  Substance Use Topics  . Smoking status: Former Games developer  . Smokeless tobacco: Never Used  . Alcohol Use: 16.8 oz/week    28 Cans of beer per week     Comment: Pt consumes beers occasionally      Review of Systems   Review of symptoms negative unless otherwise noted in HPI.   Allergies  Shellfish allergy  Home Medications   Current Outpatient Rx  Name  Route  Sig  Dispense  Refill  . ALBUTEROL SULFATE (2.5 MG/3ML) 0.083% IN NEBU   Nebulization   Take 3 mLs (2.5 mg total) by nebulization every 6 (six) hours as needed for wheezing.   75 mL   12   . ASPIRIN 325 MG PO TABS   Oral   Take 1 tablet (325 mg total) by mouth daily.   30 tablet   0   .  COLCHICINE 0.6 MG PO TABS   Oral   Take 1 tablet (0.6 mg total) by mouth daily. Gout Flare   10 tablet   0   . FEBUXOSTAT 40 MG PO TABS   Oral   Take 1 tablet (40 mg total) by mouth daily.   10 tablet   0   . FLUOXETINE HCL 40 MG PO CAPS   Oral   Take 1 capsule (40 mg total) by mouth daily.   30 capsule   0   . FOLIC ACID 1 MG PO TABS   Oral   Take 1 tablet (1 mg total) by mouth daily.   30 tablet   0   . FUROSEMIDE 20 MG PO TABS   Oral   Take 1 tablet (20 mg total) by mouth daily.   30 tablet   0   . GABAPENTIN 300 MG PO CAPS   Oral   Take 1 capsule (300 mg total) by mouth 3 (three) times daily.   90 capsule   0     With meals   . GLYBURIDE 2.5 MG PO TABS   Oral   Take 1 tablet (2.5 mg total) by mouth 2 (two) times daily with a meal.   60 tablet   0   . LEVOTHYROXINE SODIUM 175  MCG PO TABS   Oral   Take 1 tablet (175 mcg total) by mouth daily before breakfast.   30 tablet   0   . LISINOPRIL 10 MG PO TABS   Oral   Take 1 tablet (10 mg total) by mouth daily.   10 tablet   0   . METFORMIN HCL 500 MG PO TABS   Oral   Take 2 tablets (1,000 mg total) by mouth daily with breakfast.   20 tablet   0   . NAPROXEN 500 MG PO TABS   Oral   Take 1 tablet (500 mg total) by mouth 2 (two) times daily with a meal.   60 tablet   0   . POTASSIUM CHLORIDE CRYS ER 20 MEQ PO TBCR   Oral   Take 1 tablet (20 mEq total) by mouth daily.   30 tablet   0   . QUETIAPINE FUMARATE 50 MG PO TABS   Oral   Take 3 tablets (150 mg total) by mouth daily at 8 pm.   90 tablet   0   . SIMVASTATIN 20 MG PO TABS   Oral   Take 1 tablet (20 mg total) by mouth daily.   10 tablet   0   . THIAMINE HCL 100 MG PO TABS   Oral   Take 1 tablet (100 mg total) by mouth daily.   10 tablet   0     BP 173/96  Pulse 60  Temp 98 F (36.7 C) (Oral)  Resp 13  SpO2 97%  Physical Exam  Nursing note and vitals reviewed. Constitutional: He is oriented to person, place, and time.  He appears well-developed and well-nourished. No distress.  HENT:  Head: Normocephalic and atraumatic.  Eyes: Conjunctivae normal are normal. Right eye exhibits no discharge. Left eye exhibits no discharge.  Neck: Neck supple.  Cardiovascular: Normal rate, regular rhythm and normal heart sounds.  Exam reveals no gallop and no friction rub.   No murmur heard. Pulmonary/Chest: Effort normal and breath sounds normal. No respiratory distress.  Abdominal: Soft. He exhibits no distension. There is no tenderness.  Musculoskeletal: He exhibits no edema and no tenderness.  Neurological: He is alert and oriented to person, place, and time.       AOx3. Speech slow and hesitant. Understandable though and content appropriate. Abel to correctly identify objects around the room. No facial droop. Tongue midline. Visual fields intact to confrontation. Strength 5/5 b/l u/l extremities. Sensation intact to light touch. Slow but smooth movements with finger to nose testing.  Skin: Skin is warm and dry.  Psychiatric: He has a normal mood and affect. His behavior is normal. Thought content normal.    ED Course  Procedures (including critical care time)  Labs Reviewed  CBC - Abnormal; Notable for the following:    Hemoglobin 12.1 (*)     HCT 35.1 (*)     RDW 18.3 (*)     All other components within normal limits  BASIC METABOLIC PANEL - Abnormal; Notable for the following:    Glucose, Bld 174 (*)     GFR calc non Af Amer 64 (*)     GFR calc Af Amer 74 (*)     All other components within normal limits  URINE RAPID DRUG SCREEN (HOSP PERFORMED) - Abnormal; Notable for the following:    Benzodiazepines POSITIVE (*)     All other components within normal limits  GLUCOSE, CAPILLARY - Abnormal; Notable for the  following:    Glucose-Capillary 106 (*)     All other components within normal limits  URINALYSIS, ROUTINE W REFLEX MICROSCOPIC  ETHANOL   Ct Head Wo Contrast  03/20/2012  *RADIOLOGY REPORT*   Clinical Data: Confusion.  Altered mental status.  Aphasia. History of hypertension, diabetes, anxiety, and depression. Ethanol and cocaine abuse.  Question CVA.  CT HEAD WITHOUT CONTRAST  Technique:  Contiguous axial images were obtained from the base of the skull through the vertex without contrast.  Comparison: None.  Findings: Bone windows demonstrate minimal mucosal thickening in the right maxillary sinus. Clear mastoid air cells.  Soft tissue windows demonstrate subtle low attenuation along the posterior limb of the left internal capsule on image 13 of series 2.  Otherwise, no hemorrhage, hydrocephalus, acute infarct, mass lesion, intra-axial, extra-axial fluid collection.  IMPRESSION:  1.  Low density within the left basil ganglia, favored to represent a dilated perivascular space or possibly a remote lacunar infarct. Subacute ischemia is felt less likely.  Correlate with localizing symptoms. 2.  Otherwise, no acute intracranial abnormality.   Original Report Authenticated By: Jeronimo Greaves, M.D.    EKG:  Rhythm: sinus bradycardia Vent. rate 54 BPM PR interval 181 ms QRS duration 92 ms QT/QTc 436/413 ms ST segments: normal   1. Stuttering   2. LE edema    MDM  51yM with slow and stuttering speech. Head CT with low attention focus posterior limb of internal capsule which could potentially explain this. Will MR to further evaluate. Hx of substance abuse and other psychiatric problems, but I think this is less likely etiology. Consider medication induced, possibly neurontin. UDS positive for benzos and not on med list. Pt was just discharged 11/12 and possibly could have had them during hospitalization. Would expect more globally depressed neurologic state and not specifically stuttering if from benzos though.  12:30 PM With evidence of prior putamen stroke, but no acute or subacute abnormalities. Pt's speech interestingly much better when discussing DC instructions. Possiblly psychogenic. Pt  c/o increasingly LE edema. Cr and K normal. Do not think unreasonable for pt to increase lasix for next 3 days. Instructed to increase L supplementation as well and then to go back to previous dosing. He understands the need to followup with his PCP to discuss this further.     Raeford Razor, MD 03/20/12 [phone]

## 2012-03-20 NOTE — ED Notes (Signed)
Patient transported to MRI 

## 2012-03-25 ENCOUNTER — Emergency Department (HOSPITAL_COMMUNITY)
Admission: EM | Admit: 2012-03-25 | Discharge: 2012-03-25 | Discharge status: Against Medical Advice | Payer: No Typology Code available for payment source | Attending: Emergency Medicine | Admitting: Emergency Medicine

## 2012-03-25 ENCOUNTER — Encounter (HOSPITAL_COMMUNITY): Payer: Self-pay | Admitting: *Deleted

## 2012-03-25 DIAGNOSIS — M7989 Other specified soft tissue disorders: Secondary | ICD-10-CM | POA: Insufficient documentation

## 2012-03-25 DIAGNOSIS — I509 Heart failure, unspecified: Secondary | ICD-10-CM | POA: Insufficient documentation

## 2012-03-25 DIAGNOSIS — E119 Type 2 diabetes mellitus without complications: Secondary | ICD-10-CM | POA: Insufficient documentation

## 2012-03-25 NOTE — ED Notes (Signed)
Pt c/o swelling in his feet, sts he has DM and CHF and has recently increased his lasix and potassium, also sts he has an infected toe on his right foot, big toe

## 2012-03-25 NOTE — ED Notes (Signed)
Pt called x2 with no response °

## 2012-03-29 NOTE — Discharge Summary (Signed)
Agree with assessment and plan Clytee Heinrich A. Tin Engram, M.D. 

## 2012-03-31 ENCOUNTER — Encounter (HOSPITAL_COMMUNITY): Payer: Self-pay | Admitting: Emergency Medicine

## 2012-03-31 ENCOUNTER — Emergency Department (HOSPITAL_COMMUNITY)
Admission: EM | Admit: 2012-03-31 | Discharge: 2012-04-01 | Disposition: A | Discharge status: Other Acute Inpatient Hospital | Payer: Medicare (Managed Care) | Source: Home / Self Care | Attending: Emergency Medicine | Admitting: Emergency Medicine

## 2012-03-31 DIAGNOSIS — E079 Disorder of thyroid, unspecified: Secondary | ICD-10-CM | POA: Insufficient documentation

## 2012-03-31 DIAGNOSIS — F411 Generalized anxiety disorder: Secondary | ICD-10-CM | POA: Insufficient documentation

## 2012-03-31 DIAGNOSIS — Z79899 Other long term (current) drug therapy: Secondary | ICD-10-CM | POA: Insufficient documentation

## 2012-03-31 DIAGNOSIS — F17200 Nicotine dependence, unspecified, uncomplicated: Secondary | ICD-10-CM | POA: Insufficient documentation

## 2012-03-31 DIAGNOSIS — F3289 Other specified depressive episodes: Secondary | ICD-10-CM | POA: Insufficient documentation

## 2012-03-31 DIAGNOSIS — F329 Major depressive disorder, single episode, unspecified: Secondary | ICD-10-CM

## 2012-03-31 DIAGNOSIS — F1910 Other psychoactive substance abuse, uncomplicated: Secondary | ICD-10-CM

## 2012-03-31 DIAGNOSIS — Z8673 Personal history of transient ischemic attack (TIA), and cerebral infarction without residual deficits: Secondary | ICD-10-CM | POA: Insufficient documentation

## 2012-03-31 DIAGNOSIS — E119 Type 2 diabetes mellitus without complications: Secondary | ICD-10-CM | POA: Insufficient documentation

## 2012-03-31 DIAGNOSIS — R45851 Suicidal ideations: Secondary | ICD-10-CM | POA: Insufficient documentation

## 2012-03-31 DIAGNOSIS — I10 Essential (primary) hypertension: Secondary | ICD-10-CM | POA: Insufficient documentation

## 2012-03-31 LAB — GLUCOSE, CAPILLARY
Glucose-Capillary: 78 mg/dL (ref 70–99)
Glucose-Capillary: 77 mg/dL (ref 70–99)

## 2012-03-31 LAB — ETHANOL: Alcohol, Ethyl (B): 60 mg/dL — ABNORMAL HIGH (ref 0–11)

## 2012-03-31 LAB — CBC
MCH: 27.2 pg (ref 26.0–34.0)
MCV: 77.5 fL — ABNORMAL LOW (ref 78.0–100.0)
Platelets: 357 10*3/uL (ref 150–400)
RBC: 5.52 MIL/uL (ref 4.22–5.81)

## 2012-03-31 LAB — COMPREHENSIVE METABOLIC PANEL
AST: 47 U/L — ABNORMAL HIGH (ref 0–37)
CO2: 18 mEq/L — ABNORMAL LOW (ref 19–32)
Calcium: 9.2 mg/dL (ref 8.4–10.5)
Creatinine, Ser: 1.17 mg/dL (ref 0.50–1.35)
GFR calc Af Amer: 82 mL/min — ABNORMAL LOW (ref >90)
GFR calc non Af Amer: 71 mL/min — ABNORMAL LOW (ref >90)

## 2012-03-31 LAB — RAPID URINE DRUG SCREEN, HOSP PERFORMED
Amphetamines: NOT DETECTED
Opiates: NOT DETECTED

## 2012-03-31 LAB — SALICYLATE LEVEL: Salicylate Lvl: <2 mg/dL — ABNORMAL LOW (ref 2.8–20.0)

## 2012-03-31 MED ORDER — ZOLPIDEM TARTRATE 5 MG PO TABS
5.0000 mg | ORAL_TABLET | Freq: Every evening | ORAL | Status: DC | PRN
  Administered 2012-03-31: 5 mg via ORAL
  Filled 2012-03-31: qty 1

## 2012-03-31 MED ORDER — POTASSIUM CHLORIDE CRYS ER 20 MEQ PO TBCR
40.0000 meq | EXTENDED_RELEASE_TABLET | Freq: Four times a day (QID) | ORAL | Status: DC
  Administered 2012-03-31 (×2): 40 meq via ORAL
  Filled 2012-03-31 (×2): qty 2

## 2012-03-31 MED ORDER — LEVOTHYROXINE SODIUM 175 MCG PO TABS
175.0000 ug | ORAL_TABLET | Freq: Every day | ORAL | Status: DC
  Filled 2012-03-31 (×2): qty 1

## 2012-03-31 MED ORDER — ONDANSETRON HCL 4 MG PO TABS: 4.0000 mg | ORAL_TABLET | Freq: Three times a day (TID) | ORAL | Status: DC | PRN

## 2012-03-31 MED ORDER — GLYBURIDE 2.5 MG PO TABS
2.5000 mg | ORAL_TABLET | Freq: Two times a day (BID) | ORAL | Status: DC
  Administered 2012-03-31: 2.5 mg via ORAL
  Filled 2012-03-31 (×4): qty 1

## 2012-03-31 MED ORDER — SIMVASTATIN 20 MG PO TABS
20.0000 mg | ORAL_TABLET | Freq: Every day | ORAL | Status: DC
  Administered 2012-03-31: 20 mg via ORAL
  Filled 2012-03-31 (×3): qty 1

## 2012-03-31 MED ORDER — LORAZEPAM 1 MG PO TABS
1.0000 mg | ORAL_TABLET | Freq: Three times a day (TID) | ORAL | Status: DC | PRN
  Administered 2012-03-31: 1 mg via ORAL
  Filled 2012-03-31: qty 1

## 2012-03-31 MED ORDER — FUROSEMIDE 40 MG PO TABS
40.0000 mg | ORAL_TABLET | Freq: Every day | ORAL | Status: DC
  Administered 2012-03-31: 40 mg via ORAL
  Filled 2012-03-31 (×2): qty 1

## 2012-03-31 MED ORDER — ACETAMINOPHEN 325 MG PO TABS: 650.0000 mg | ORAL_TABLET | ORAL | Status: DC | PRN

## 2012-03-31 MED ORDER — LISINOPRIL 20 MG PO TABS
20.0000 mg | ORAL_TABLET | Freq: Every day | ORAL | Status: DC
  Administered 2012-03-31: 20 mg via ORAL
  Filled 2012-03-31 (×2): qty 1

## 2012-03-31 MED ORDER — METFORMIN HCL 500 MG PO TABS
1000.0000 mg | ORAL_TABLET | Freq: Every day | ORAL | Status: DC
  Filled 2012-03-31 (×2): qty 2
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
## 2012-03-31 NOTE — ED Provider Notes (Signed)
Medical screening examination/treatment/procedure(s) were performed by non-physician practitioner and as supervising physician I was immediately available for consultation/collaboration.  Raeford Razor, MD 03/31/12 [phone]

## 2012-03-31 NOTE — ED Notes (Signed)
Pt states that he has been really depressed the past five days and hasnt been taking his meds because he hasnt wanted to. Pt states that he has been "drinking and drugin", doing alcohol and crack the past five days. Pt also c/o having thoughts of wanting to hurt himself. Thoughts that if he did enough drinking and drugs it will lead him to do something (to hurt himself). Pt states he did wanted to cut himself today but left the knife in the car.

## 2012-03-31 NOTE — ED Provider Notes (Signed)
History     CSN: [id number]  Arrival date & time 03/31/12  1553   First MD Initiated Contact with Patient 03/31/12 1605      No chief complaint on file.   (Consider location/radiation/quality/duration/timing/severity/associated sxs/prior treatment) Patient is a 51 y.o. male presenting with mental health disorder. The history is provided by the patient.  Mental Health Problem The primary symptoms include dysphoric mood. The current episode started more than 2 weeks ago.  The degree of incapacity that he is experiencing as a consequence of his illness is severe. Associated symptoms comments: The patient is tearful and relates severe depression, drug use and neglect regarding taking his usual medications. He reports he hasn't been able to care about anything and hoped that while intoxicated he would have the courage to take his life. No suicide attempt or self injury..    Past Medical History  Diagnosis Date  . Diabetes mellitus   . Hypertension   . Hypothyroidism   . Anxiety   . Depression   . Stroke     does not remember when    Past Surgical History  Procedure Date  . Tonsillectomy     Family History  Problem Relation Age of Onset  . Coronary artery disease Mother   . Diabetes type II Father   . Hypertension Father     History  Substance Use Topics  . Smoking status: Current Every Day Smoker    Types: Cigarettes  . Smokeless tobacco: Never Used  . Alcohol Use: 16.8 oz/week    28 Cans of beer per week     Comment: Pt consumes beers occasionally      Review of Systems  Constitutional: Negative for fever and chills.  Respiratory: Negative.   Cardiovascular: Negative.   Gastrointestinal: Negative.   Musculoskeletal: Negative.   Skin: Negative.   Neurological: Negative.   Psychiatric/Behavioral: Positive for suicidal ideas and dysphoric mood.    Allergies  Shellfish allergy  Home Medications   Current Outpatient Rx  Name  Route  Sig  Dispense  Refill   . ALBUTEROL SULFATE (2.5 MG/3ML) 0.083% IN NEBU   Nebulization   Take 3 mLs (2.5 mg total) by nebulization every 6 (six) hours as needed for wheezing.   75 mL   12   . ASPIRIN 325 MG PO TABS   Oral   Take 1 tablet (325 mg total) by mouth daily.   30 tablet   0   . COLCHICINE 0.6 MG PO TABS   Oral   Take 1 tablet (0.6 mg total) by mouth daily. Gout Flare   10 tablet   0   . FEBUXOSTAT 40 MG PO TABS   Oral   Take 1 tablet (40 mg total) by mouth daily.   10 tablet   0   . FLUOXETINE HCL 40 MG PO CAPS   Oral   Take 1 capsule (40 mg total) by mouth daily.   30 capsule   0   . FOLIC ACID 1 MG PO TABS   Oral   Take 1 tablet (1 mg total) by mouth daily.   30 tablet   0   . FUROSEMIDE 20 MG PO TABS   Oral   Take 40 mg by mouth daily.         . GLYBURIDE 2.5 MG PO TABS   Oral   Take 1 tablet (2.5 mg total) by mouth 2 (two) times daily with a meal.   60 tablet   0   .  LEVOTHYROXINE SODIUM 175 MCG PO TABS   Oral   Take 1 tablet (175 mcg total) by mouth daily before breakfast.   30 tablet   0   . LISINOPRIL 10 MG PO TABS   Oral   Take 20 mg by mouth daily.         Marland Kitchen METFORMIN HCL 500 MG PO TABS   Oral   Take 2 tablets (1,000 mg total) by mouth daily with breakfast.   20 tablet   0   . NAPROXEN 500 MG PO TABS   Oral   Take 1 tablet (500 mg total) by mouth 2 (two) times daily with a meal.   60 tablet   0   . POTASSIUM CHLORIDE CRYS ER 20 MEQ PO TBCR   Oral   Take 40 mEq by mouth 4 (four) times daily.         . QUETIAPINE FUMARATE 50 MG PO TABS   Oral   Take 3 tablets (150 mg total) by mouth daily at 8 pm.   90 tablet   0   . SIMVASTATIN 20 MG PO TABS   Oral   Take 1 tablet (20 mg total) by mouth daily.   10 tablet   0     BP 168/94  Pulse 66  Temp 98.1 F (36.7 C) (Oral)  Resp 18  Ht 5\' 10"  (1.778 m)  SpO2 95%  Physical Exam  Constitutional: He is oriented to person, place, and time. He appears well-developed and well-nourished.    HENT:  Head: Normocephalic.  Neck: Normal range of motion. Neck supple.  Cardiovascular: Normal rate and regular rhythm.   Pulmonary/Chest: Effort normal and breath sounds normal.  Abdominal: Soft. Bowel sounds are normal. There is no tenderness. There is no rebound and no guarding.  Musculoskeletal: Normal range of motion.  Neurological: He is alert and oriented to person, place, and time.  Skin: Skin is warm and dry. No rash noted.  Psychiatric: His speech is normal. He exhibits a depressed mood.    ED Course  Procedures (including critical care time)  Labs Reviewed  CBC - Abnormal; Notable for the following:    MCV 77.5 (*)     RDW 17.5 (*)     All other components within normal limits  COMPREHENSIVE METABOLIC PANEL - Abnormal; Notable for the following:    CO2 18 (*)     Glucose, Bld 69 (*)     AST 47 (*)     ALT 55 (*)     GFR calc non Af Amer 71 (*)     GFR calc Af Amer 82 (*)     All other components within normal limits  ETHANOL - Abnormal; Notable for the following:    Alcohol, Ethyl (B) 60 (*)     All other components within normal limits  SALICYLATE LEVEL - Abnormal; Notable for the following:    Salicylate Lvl <2.0 (*)     All other components within normal limits  URINE RAPID DRUG SCREEN (HOSP PERFORMED) - Abnormal; Notable for the following:    Cocaine POSITIVE (*)     All other components within normal limits  ACETAMINOPHEN LEVEL  GLUCOSE, CAPILLARY   No results found.   No diagnosis found.  1. Depression 2. Polysubstance abuse   MDM  He has apparently been reported as a missing person. He requests that his wife be called, which was done. He is medically cleared and will move to psych holding for [facility]  evaluation and placement.        Rodena Medin, PA-C 03/31/12 1911

## 2012-03-31 NOTE — ED Notes (Signed)
Pt asked that we call his wife to let her know that he is here.   Called pt's wife, Zella Ball, with pt at my side. Informed her that he is here to be treated.

## 2012-04-01 ENCOUNTER — Encounter (HOSPITAL_COMMUNITY): Payer: Self-pay

## 2012-04-01 ENCOUNTER — Inpatient Hospital Stay (HOSPITAL_COMMUNITY)
Admission: EM | Admit: 2012-04-01 | Discharge: 2012-04-09 | DRG: 885 | Disposition: A | Discharge status: Home / Self Care | Payer: Medicare (Managed Care) | Source: Ambulatory Visit | Attending: Psychiatry | Admitting: Psychiatry

## 2012-04-01 DIAGNOSIS — E039 Hypothyroidism, unspecified: Secondary | ICD-10-CM

## 2012-04-01 DIAGNOSIS — Z8673 Personal history of transient ischemic attack (TIA), and cerebral infarction without residual deficits: Secondary | ICD-10-CM

## 2012-04-01 DIAGNOSIS — Z79899 Other long term (current) drug therapy: Secondary | ICD-10-CM

## 2012-04-01 DIAGNOSIS — F1994 Other psychoactive substance use, unspecified with psychoactive substance-induced mood disorder: Secondary | ICD-10-CM

## 2012-04-01 DIAGNOSIS — F1420 Cocaine dependence, uncomplicated: Secondary | ICD-10-CM

## 2012-04-01 DIAGNOSIS — IMO0001 Reserved for inherently not codable concepts without codable children: Secondary | ICD-10-CM | POA: Diagnosis present

## 2012-04-01 DIAGNOSIS — F339 Major depressive disorder, recurrent, unspecified: Principal | ICD-10-CM | POA: Diagnosis present

## 2012-04-01 DIAGNOSIS — I10 Essential (primary) hypertension: Secondary | ICD-10-CM | POA: Diagnosis present

## 2012-04-01 DIAGNOSIS — E119 Type 2 diabetes mellitus without complications: Secondary | ICD-10-CM | POA: Diagnosis present

## 2012-04-01 DIAGNOSIS — F1999 Other psychoactive substance use, unspecified with unspecified psychoactive substance-induced disorder: Secondary | ICD-10-CM | POA: Diagnosis present

## 2012-04-01 DIAGNOSIS — Z7982 Long term (current) use of aspirin: Secondary | ICD-10-CM

## 2012-04-01 DIAGNOSIS — F32A Depression, unspecified: Secondary | ICD-10-CM | POA: Diagnosis present

## 2012-04-01 DIAGNOSIS — F1410 Cocaine abuse, uncomplicated: Secondary | ICD-10-CM | POA: Diagnosis present

## 2012-04-01 DIAGNOSIS — IMO0002 Reserved for concepts with insufficient information to code with codable children: Secondary | ICD-10-CM

## 2012-04-01 DIAGNOSIS — F329 Major depressive disorder, single episode, unspecified: Secondary | ICD-10-CM

## 2012-04-01 DIAGNOSIS — F419 Anxiety disorder, unspecified: Secondary | ICD-10-CM

## 2012-04-01 DIAGNOSIS — E1165 Type 2 diabetes mellitus with hyperglycemia: Secondary | ICD-10-CM | POA: Diagnosis present

## 2012-04-01 DIAGNOSIS — F411 Generalized anxiety disorder: Secondary | ICD-10-CM | POA: Diagnosis present

## 2012-04-01 DIAGNOSIS — F1020 Alcohol dependence, uncomplicated: Secondary | ICD-10-CM | POA: Diagnosis present

## 2012-04-01 LAB — GLUCOSE, CAPILLARY
Glucose-Capillary: 67 mg/dL — ABNORMAL LOW (ref 70–99)
Glucose-Capillary: 159 mg/dL — ABNORMAL HIGH (ref 70–99)

## 2012-04-01 MED ORDER — FLUOXETINE HCL 20 MG PO CAPS
40.0000 mg | ORAL_CAPSULE | Freq: Every day | ORAL | Status: DC
  Administered 2012-04-01 – 2012-04-09 (×9): 40 mg via ORAL
  Filled 2012-04-01 (×10): qty 2

## 2012-04-01 MED ORDER — CLONIDINE HCL 0.1 MG PO TABS
0.1000 mg | ORAL_TABLET | Freq: Four times a day (QID) | ORAL | Status: AC
  Administered 2012-04-01 (×3): 0.1 mg via ORAL
  Filled 2012-04-01 (×7): qty 1

## 2012-04-01 MED ORDER — FUROSEMIDE 20 MG PO TABS
20.0000 mg | ORAL_TABLET | Freq: Every day | ORAL | Status: DC
  Administered 2012-04-01 – 2012-04-09 (×9): 20 mg via ORAL
  Filled 2012-04-01 (×10): qty 1

## 2012-04-01 MED ORDER — LOPERAMIDE HCL 2 MG PO CAPS: 2.0000 mg | ORAL_CAPSULE | ORAL | Status: AC | PRN

## 2012-04-01 MED ORDER — LISINOPRIL 10 MG PO TABS
10.0000 mg | ORAL_TABLET | Freq: Every day | ORAL | Status: DC
  Administered 2012-04-01 – 2012-04-06 (×5): 10 mg via ORAL
  Filled 2012-04-01 (×7): qty 1

## 2012-04-01 MED ORDER — CHLORDIAZEPOXIDE HCL 25 MG PO CAPS: 25.0000 mg | ORAL_CAPSULE | Freq: Four times a day (QID) | ORAL | Status: AC | PRN

## 2012-04-01 MED ORDER — FOLIC ACID 1 MG PO TABS
1.0000 mg | ORAL_TABLET | Freq: Every day | ORAL | Status: DC
  Administered 2012-04-01 – 2012-04-09 (×9): 1 mg via ORAL
  Filled 2012-04-01 (×10): qty 1

## 2012-04-01 MED ORDER — MAGNESIUM HYDROXIDE 400 MG/5ML PO SUSP: 30.0000 mL | Freq: Every day | ORAL | Status: DC | PRN

## 2012-04-01 MED ORDER — FEBUXOSTAT 40 MG PO TABS
40.0000 mg | ORAL_TABLET | Freq: Every day | ORAL | Status: DC
  Administered 2012-04-01 – 2012-04-09 (×9): 40 mg via ORAL
  Filled 2012-04-01 (×10): qty 1

## 2012-04-01 MED ORDER — VITAMIN B-1 100 MG PO TABS
100.0000 mg | ORAL_TABLET | Freq: Every day | ORAL | Status: DC
  Administered 2012-04-02 – 2012-04-09 (×8): 100 mg via ORAL
  Filled 2012-04-01 (×9): qty 1

## 2012-04-01 MED ORDER — CHLORDIAZEPOXIDE HCL 25 MG PO CAPS
25.0000 mg | ORAL_CAPSULE | Freq: Three times a day (TID) | ORAL | Status: AC
  Filled 2012-04-01: qty 1

## 2012-04-01 MED ORDER — CHLORDIAZEPOXIDE HCL 25 MG PO CAPS
25.0000 mg | ORAL_CAPSULE | ORAL | Status: AC
  Administered 2012-04-03: 25 mg via ORAL
  Filled 2012-04-01: qty 1

## 2012-04-01 MED ORDER — ADULT MULTIVITAMIN W/MINERALS CH
1.0000 | ORAL_TABLET | Freq: Every day | ORAL | Status: DC
  Administered 2012-04-01 – 2012-04-09 (×9): 1 via ORAL
  Filled 2012-04-01 (×10): qty 1

## 2012-04-01 MED ORDER — ACETAMINOPHEN 650 MG RE SUPP: 650.0000 mg | Freq: Four times a day (QID) | RECTAL | Status: DC | PRN

## 2012-04-01 MED ORDER — CHLORDIAZEPOXIDE HCL 25 MG PO CAPS
25.0000 mg | ORAL_CAPSULE | Freq: Four times a day (QID) | ORAL | Status: AC
  Administered 2012-04-01 (×3): 25 mg via ORAL
  Filled 2012-04-01 (×3): qty 1

## 2012-04-01 MED ORDER — ONDANSETRON HCL 4 MG PO TABS: 4.0000 mg | ORAL_TABLET | Freq: Four times a day (QID) | ORAL | Status: DC | PRN

## 2012-04-01 MED ORDER — CHLORDIAZEPOXIDE HCL 25 MG PO CAPS: 25.0000 mg | ORAL_CAPSULE | Freq: Once | ORAL | Status: DC

## 2012-04-01 MED ORDER — THIAMINE HCL 100 MG/ML IJ SOLN: 100.0000 mg | Freq: Once | INTRAMUSCULAR | Status: DC

## 2012-04-01 MED ORDER — CHLORDIAZEPOXIDE HCL 25 MG PO CAPS: 25.0000 mg | ORAL_CAPSULE | Freq: Every day | ORAL | Status: AC

## 2012-04-01 MED ORDER — CLONIDINE HCL 0.1 MG PO TABS
0.1000 mg | ORAL_TABLET | Freq: Every day | ORAL | Status: DC
  Filled 2012-04-01 (×2): qty 1

## 2012-04-01 MED ORDER — NAPROXEN 500 MG PO TABS
500.0000 mg | ORAL_TABLET | Freq: Two times a day (BID) | ORAL | Status: DC
  Administered 2012-04-01 – 2012-04-08 (×15): 500 mg via ORAL
  Filled 2012-04-01 (×18): qty 1

## 2012-04-01 MED ORDER — ALUM & MAG HYDROXIDE-SIMETH 200-200-20 MG/5ML PO SUSP
30.0000 mL | ORAL | Status: DC | PRN
Start: 2012-04-01 — End: 2012-04-09

## 2012-04-01 MED ORDER — GABAPENTIN 300 MG PO CAPS
300.0000 mg | ORAL_CAPSULE | Freq: Three times a day (TID) | ORAL | Status: DC
  Filled 2012-04-01 (×6): qty 1

## 2012-04-01 MED ORDER — QUETIAPINE FUMARATE 50 MG PO TABS
150.0000 mg | ORAL_TABLET | Freq: Every day | ORAL | Status: DC
  Administered 2012-04-01 – 2012-04-04 (×4): 150 mg via ORAL
  Filled 2012-04-01 (×5): qty 3

## 2012-04-01 MED ORDER — HYDROXYZINE HCL 25 MG PO TABS: 25.0000 mg | ORAL_TABLET | Freq: Four times a day (QID) | ORAL | Status: AC | PRN

## 2012-04-01 MED ORDER — ACETAMINOPHEN 325 MG PO TABS: 650.0000 mg | ORAL_TABLET | Freq: Four times a day (QID) | ORAL | Status: DC | PRN

## 2012-04-01 MED ORDER — ONDANSETRON 4 MG PO TBDP: 4.0000 mg | ORAL_TABLET | Freq: Four times a day (QID) | ORAL | Status: AC | PRN

## 2012-04-01 MED ORDER — CLONIDINE HCL 0.1 MG PO TABS
0.1000 mg | ORAL_TABLET | ORAL | Status: AC
  Administered 2012-04-03 – 2012-04-04 (×4): 0.1 mg via ORAL
  Filled 2012-04-01 (×4): qty 1

## 2012-04-01 MED ORDER — GLYBURIDE 5 MG PO TABS
2.5000 mg | ORAL_TABLET | Freq: Two times a day (BID) | ORAL | Status: DC
  Administered 2012-04-01 – 2012-04-09 (×15): 2.5 mg via ORAL
  Filled 2012-04-01 (×18): qty 1

## 2012-04-01 MED ORDER — METFORMIN HCL 500 MG PO TABS
1000.0000 mg | ORAL_TABLET | Freq: Two times a day (BID) | ORAL | Status: DC
  Administered 2012-04-01 – 2012-04-05 (×9): 1000 mg via ORAL
  Filled 2012-04-01 (×12): qty 2

## 2012-04-01 MED ORDER — COLCHICINE 0.6 MG PO TABS
0.6000 mg | ORAL_TABLET | Freq: Every day | ORAL | Status: DC
  Administered 2012-04-01 – 2012-04-09 (×9): 0.6 mg via ORAL
  Filled 2012-04-01 (×10): qty 1

## 2012-04-01 MED ORDER — LEVOTHYROXINE SODIUM 175 MCG PO TABS
175.0000 ug | ORAL_TABLET | Freq: Every day | ORAL | Status: DC
  Administered 2012-04-02 – 2012-04-07 (×6): 175 ug via ORAL
  Administered 2012-04-08: 100 ug via ORAL
  Administered 2012-04-09: 175 ug via ORAL
  Filled 2012-04-01 (×9): qty 1

## 2012-04-01 MED ORDER — POTASSIUM CHLORIDE CRYS ER 20 MEQ PO TBCR
20.0000 meq | EXTENDED_RELEASE_TABLET | Freq: Every day | ORAL | Status: DC
  Administered 2012-04-01 – 2012-04-09 (×9): 20 meq via ORAL
  Filled 2012-04-01 (×10): qty 1

## 2012-04-01 MED ORDER — ASPIRIN 325 MG PO TABS
325.0000 mg | ORAL_TABLET | Freq: Every day | ORAL | Status: DC
  Administered 2012-04-01 – 2012-04-09 (×9): 325 mg via ORAL
  Filled 2012-04-01 (×10): qty 1

## 2012-04-01 MED ORDER — ONDANSETRON HCL 4 MG/2ML IJ SOLN: 4.0000 mg | Freq: Four times a day (QID) | INTRAMUSCULAR | Status: DC | PRN

## 2012-04-01 NOTE — [facility] Suicide Risk Assessment (Signed)
Suicide Risk Assessment  Admission Assessment     Nursing information obtained from:  Patient Demographic factors:  Male;Low socioeconomic status Current Mental Status:  NA Loss Factors:  Legal issues Historical Factors:  NA Risk Reduction Factors:  Responsible for children under 51 years of age;Sense of responsibility to family;Living with another person, especially a relative;Positive social support  CLINICAL FACTORS:   Depression:   Comorbid alcohol abuse/dependence Insomnia Obsessive-Compulsive Disorder  COGNITIVE FEATURES THAT CONTRIBUTE TO RISK: None Identified   SUICIDE RISK:   Moderate:  Frequent suicidal ideation with limited intensity, and duration, some specificity in terms of plans, no associated intent, good self-control, limited dysphoria/symptomatology, some risk factors present, and identifiable protective factors, including available and accessible social support.  PLAN OF CARE: Detox, supportive approach/coping skills/relapse prevention                              Consider rehab  Ramey Schiff A 04/01/2012, 2:07 PM

## 2012-04-01 NOTE — H&P (Signed)
Psychiatric Admission Assessment Adult  Patient Identification:  Phillip Turner Date of Evaluation:  04/01/2012 Chief Complaint:  Alcohol Abuse History of Present Illness:: Did not follow up with the plan. He went back to the same situation. He did not follow up with [facility]. Was staying with wife and his kids. He used once, he continued to use for several days. He left the house. Staid here and there. Had a knife he was trying to decide what to do, if he was to pursue recovery or he was going to kill himself. Police was called. Stop taking his medications on the [address] He does not know why. Has been using cocaine, drinking 7 (40 oz.) for the last 6 days Mood Symptoms:  Anhedonia, Appetite, Depression, Energy, Guilt, Helplessness, Hopelessness, Sadness, SI, Sleep, Worthlessness, Depression Symptoms:  depressed mood, anhedonia, insomnia, psychomotor retardation, fatigue, feelings of worthlessness/guilt, difficulty concentrating, hopelessness, impaired memory, recurrent thoughts of death, suicidal thoughts with specific plan, insomnia, loss of energy/fatigue, disturbed sleep, (Hypo) Manic Symptoms:  Denies Anxiety Symptoms:  Excessive Worry, Panic Symptoms, Psychotic Symptoms:  Denies  PTSD Symptoms: Denies   Past Psychiatric History: Diagnosis:Cocaine Dependence, Alcohol Dependence, Subsbtance Induced Mood Disorder  Hospitalizations: [facility]  Outpatient Care: Did not pursue  Substance Abuse Care: Did  Not pursue  Self-Mutilation: Denies  Suicidal Attempts:Was going to cut himself  Violent Behaviors:Denies   Past Medical History:   Past Medical History  Diagnosis Date  . Diabetes mellitus   . Hypertension   . Hypothyroidism   . Anxiety   . Depression   . Stroke     does not remember when    Allergies:   Allergies  Allergen Reactions  . Shellfish Allergy Anaphylaxis and Nausea And Vomiting   PTA Medications: Prescriptions prior to admission  Medication  Sig Dispense Refill  . albuterol (PROVENTIL) (2.5 MG/3ML) 0.083% nebulizer solution Take 3 mLs (2.5 mg total) by nebulization every 6 (six) hours as needed for wheezing.  75 mL  12  . aspirin 325 MG tablet Take 1 tablet (325 mg total) by mouth daily.  30 tablet  0  . colchicine 0.6 MG tablet Take 1 tablet (0.6 mg total) by mouth daily. Gout Flare  10 tablet  0  . febuxostat (ULORIC) 40 MG tablet Take 1 tablet (40 mg total) by mouth daily.  10 tablet  0  . FLUoxetine (PROZAC) 40 MG capsule Take 1 capsule (40 mg total) by mouth daily.  30 capsule  0  . folic acid (FOLVITE) 1 MG tablet Take 1 tablet (1 mg total) by mouth daily.  30 tablet  0  . furosemide (LASIX) 20 MG tablet Take 40 mg by mouth daily.      Marland Kitchen glyBURIDE (DIABETA) 2.5 MG tablet Take 1 tablet (2.5 mg total) by mouth 2 (two) times daily with a meal.  60 tablet  0  . levothyroxine (SYNTHROID, LEVOTHROID) 175 MCG tablet Take 1 tablet (175 mcg total) by mouth daily before breakfast.  30 tablet  0  . lisinopril (PRINIVIL,ZESTRIL) 10 MG tablet Take 20 mg by mouth daily.      . metFORMIN (GLUCOPHAGE) 500 MG tablet Take 2 tablets (1,000 mg total) by mouth daily with breakfast.  20 tablet  0  . naproxen (NAPROSYN) 500 MG tablet Take 1 tablet (500 mg total) by mouth 2 (two) times daily with a meal.  60 tablet  0  . potassium chloride SA (K-DUR,KLOR-CON) 20 MEQ tablet Take 40 mEq by mouth 4 (four) times daily.      Marland Kitchen  QUEtiapine (SEROQUEL) 50 MG tablet Take 3 tablets (150 mg total) by mouth daily at 8 pm.  90 tablet  0  . simvastatin (ZOCOR) 20 MG tablet Take 1 tablet (20 mg total) by mouth daily.  10 tablet  0    Previous Psychotropic Medications:  Medication/Dose  Prozac, Neurontin, Seroquel               Substance Abuse History in the last 12 months: Substance Age of 1st Use Last Use Amount Specific Type  Nicotine      Alcohol 22 Before he came here 7 (40 OZ) Beer  Cannabis      Opiates      Cocaine  Before he came here  Crack    Methamphetamines      LSD      Ecstasy      Benzodiazepines      Caffeine      Inhalants      Others:                         Consequences of Substance Abuse: Depression, with SI   Social History: Current Place of Residence:   Place of Birth:   Family Members: Marital Status:  Married Children:  Sons:One   Daughters:Three Relationships: Education:  GED Educational Problems/Performance: Religious Beliefs/Practices: History of Abuse (Emotional/Phsycial/Sexual) Occupational Experiences; On disability after the stroke Military History:  None. Legal History: Hobbies/Interests:  Family History:   Family History  Problem Relation Age of Onset  . Coronary artery disease Mother   . Diabetes type II Father   . Hypertension Father     Mental Status Examination/Evaluation: Objective:  Appearance: Disheveled  Eye Contact::  Minimal  Speech:  Clear and Coherent, Slow and Not spontaneous  Volume:  Decreased  Mood:  Depressed  Affect:  Restricted  Thought Process:  Coherent, Goal Directed and Not spontaneous  Orientation:  Full  Thought Content:  Shame, guilt, worthlessness, helplessness  Suicidal Thoughts:  Yes.  with intent/plan  Homicidal Thoughts:  No  Memory:  Immediate;   Fair Recent;   Fair Remote;   Fair  Judgement:  Fair  Insight:  Present  Psychomotor Activity:  Decreased  Concentration:  Fair  Recall:  Fair  Akathisia:  No  Handed:  Right  AIMS (if indicated):     Assets:  Desire for Improvement Housing Social Support  Sleep:       Laboratory/X-Ray Psychological Evaluation(s)      Assessment:    AXIS I:  Alcohol Dependence, Cocaine Dependence, Substance Induced Mood Disorder AXIS II:  Deferred AXIS III:   Past Medical History  Diagnosis Date  . Diabetes mellitus   . Hypertension   . Hypothyroidism   . Anxiety   . Depression   . Stroke     does not remember when   AXIS IV:  Consequences of Addiction AXIS V:  51-60 moderate  symptoms  Treatment Plan/Recommendations: Reassess medications after detox  Treatment Plan Summary: Daily contact with patient to assess and evaluate symptoms and progress in treatment Medication management Current Medications:  Current Facility-Administered Medications  Medication Dose Route Frequency Provider Last Rate Last Dose  . acetaminophen (TYLENOL) tablet 650 mg  650 mg Oral Q6H PRN Nanine Means, NP      . alum & mag hydroxide-simeth (MAALOX/MYLANTA) 200-200-20 MG/5ML suspension 30 mL  30 mL Oral Q4H PRN Nanine Means, NP      . aspirin tablet 325 mg  325  mg Oral Daily Nanine Means, NP      . chlordiazePOXIDE (LIBRIUM) capsule 25 mg  25 mg Oral Q6H PRN Nanine Means, NP      . chlordiazePOXIDE (LIBRIUM) capsule 25 mg  25 mg Oral QID Rachael Fee, MD       Followed by  . chlordiazePOXIDE (LIBRIUM) capsule 25 mg  25 mg Oral TID Rachael Fee, MD       Followed by  . chlordiazePOXIDE (LIBRIUM) capsule 25 mg  25 mg Oral BH-qamhs Rachael Fee, MD       Followed by  . chlordiazePOXIDE (LIBRIUM) capsule 25 mg  25 mg Oral Daily Rachael Fee, MD      . cloNIDine (CATAPRES) tablet 0.1 mg  0.1 mg Oral QID Nanine Means, NP       Followed by  . cloNIDine (CATAPRES) tablet 0.1 mg  0.1 mg Oral BH-qamhs Nanine Means, NP       Followed by  . cloNIDine (CATAPRES) tablet 0.1 mg  0.1 mg Oral QAC breakfast Nanine Means, NP      . colchicine tablet 0.6 mg  0.6 mg Oral Daily Nanine Means, NP      . febuxostat (ULORIC) tablet 40 mg  40 mg Oral Daily Nanine Means, NP      . FLUoxetine (PROZAC) capsule 40 mg  40 mg Oral Daily Nanine Means, NP      . folic acid (FOLVITE) tablet 1 mg  1 mg Oral Daily Nanine Means, NP      . furosemide (LASIX) tablet 20 mg  20 mg Oral Daily Nanine Means, NP      . gabapentin (NEURONTIN) capsule 300 mg  300 mg Oral TID Nanine Means, NP      . glyBURIDE (DIABETA) tablet 2.5 mg  2.5 mg Oral BID WC Nanine Means, NP      . hydrOXYzine (ATARAX/VISTARIL) tablet 25 mg  25 mg  Oral Q6H PRN Nanine Means, NP      . levothyroxine (SYNTHROID, LEVOTHROID) tablet 175 mcg  175 mcg Oral QAC breakfast Nanine Means, NP      . lisinopril (PRINIVIL,ZESTRIL) tablet 10 mg  10 mg Oral Daily Nanine Means, NP      . loperamide (IMODIUM) capsule 2-4 mg  2-4 mg Oral PRN Nanine Means, NP      . magnesium hydroxide (MILK OF MAGNESIA) suspension 30 mL  30 mL Oral Daily PRN Nanine Means, NP      . metFORMIN (GLUCOPHAGE) tablet 1,000 mg  1,000 mg Oral BID WC Nanine Means, NP      . multivitamin with minerals tablet 1 tablet  1 tablet Oral Daily Nanine Means, NP      . naproxen (NAPROSYN) tablet 500 mg  500 mg Oral BID WC Nanine Means, NP      . ondansetron (ZOFRAN-ODT) disintegrating tablet 4 mg  4 mg Oral Q6H PRN Nanine Means, NP      . potassium chloride SA (K-DUR,KLOR-CON) CR tablet 20 mEq  20 mEq Oral Daily Nanine Means, NP      . QUEtiapine (SEROQUEL) tablet 150 mg  150 mg Oral QHS Nanine Means, NP      . thiamine (B-1) injection 100 mg  100 mg Intramuscular Once Nanine Means, NP      . thiamine (VITAMIN B-1) tablet 100 mg  100 mg Oral Daily Nanine Means, NP      . [DISCONTINUED] acetaminophen (TYLENOL) suppository 650 mg  650 mg Rectal Q6H PRN  Verne Spurr, PA-C      . [DISCONTINUED] acetaminophen (TYLENOL) tablet 650 mg  650 mg Oral Q6H PRN Verne Spurr, PA-C      . [DISCONTINUED] chlordiazePOXIDE (LIBRIUM) capsule 25 mg  25 mg Oral Once Nanine Means, NP      . [DISCONTINUED] ondansetron [facility]) injection 4 mg  4 mg Intravenous Q6H PRN Verne Spurr, PA-C      . [DISCONTINUED] ondansetron [facility]) tablet 4 mg  4 mg Oral Q6H PRN Verne Spurr, PA-C       Facility-Administered Medications Ordered in Other Encounters  Medication Dose Route Frequency Provider Last Rate Last Dose  . [DISCONTINUED] acetaminophen (TYLENOL) tablet 650 mg  650 mg Oral Q4H PRN Rodena Medin, PA-C      . [DISCONTINUED] furosemide (LASIX) tablet 40 mg  40 mg Oral Daily Rodena Medin, PA-C   40 mg at  03/31/12 1656  . [DISCONTINUED] glyBURIDE (DIABETA) tablet 2.5 mg  2.5 mg Oral BID WC Rodena Medin, PA-C   2.5 mg at 03/31/12 1701  . [DISCONTINUED] levothyroxine (SYNTHROID, LEVOTHROID) tablet 175 mcg  175 mcg Oral QAC breakfast Rodena Medin, PA-C      . [DISCONTINUED] lisinopril (PRINIVIL,ZESTRIL) tablet 20 mg  20 mg Oral Daily Rodena Medin, PA-C   20 mg at 03/31/12 1655  . [DISCONTINUED] LORazepam (ATIVAN) tablet 1 mg  1 mg Oral Q8H PRN Rodena Medin, PA-C   1 mg at 03/31/12 2126  . [DISCONTINUED] metFORMIN (GLUCOPHAGE) tablet 1,000 mg  1,000 mg Oral Q breakfast Rodena Medin, PA-C      . [DISCONTINUED] ondansetron (ZOFRAN) tablet 4 mg  4 mg Oral Q8H PRN Rodena Medin, PA-C      . [DISCONTINUED] potassium chloride SA (K-DUR,KLOR-CON) CR tablet 40 mEq  40 mEq Oral QID Rodena Medin, PA-C   40 mEq at 03/31/12 2126  . [DISCONTINUED] simvastatin (ZOCOR) tablet 20 mg  20 mg Oral q1800 Rodena Medin, PA-C   20 mg at 03/31/12 1846  . [DISCONTINUED] zolpidem (AMBIEN) tablet 5 mg  5 mg Oral QHS PRN Rodena Medin, PA-C   5 mg at 03/31/12 2126    Observation Level/Precautions:  Detox  Laboratory:  As per the ED  Psychotherapy:    Medications:  Detox, reassess medications  Routine PRN Medications:  No  Consultations:    Discharge Concerns:    Other:     Oval Cavazos A 11/27/20131:46 PM

## 2012-04-01 NOTE — Clinical Social Work Note (Signed)
[facility] Group Note : Clinical Social Worker Group Therapy   04/01/2012 1:15 PM   Type of Therapy: Group Therapy - Process Group   Participation Level: Appropriate   Participation Quality: Appropriate   Affect: Appropriate   Cognitive: Alert   Insight: Good   Engagement in Group: Good   Engagement in Therapy: Good   Modes of Intervention: Clarification, Education, Problem-solving, Socialization and Support   Summary of Progress/Problems: The topic for group today was emotional regulation. Pt participated in the discussion regarding what emotional regulation is and how it affects their life, positive and negative. Pt discussed coping skills and ways they can regulate their emotions in a positive manner. Pt shared that he is disappointed in himself for coming back here.    Rexford Prevo Horton, LCSWA  04/01/2012 3:00 pm

## 2012-04-01 NOTE — Progress Notes (Signed)
D: Patient in dayroom on approach.  Patient states that his day was not so good.  Patient states he is depressed.  Patient appears flat and blunted.  Patient rates depression 10/10.  Patient denies SI/ HI and denies AVH.  Patient states as long as he is here he feels better and does not feel suicidal.  Patient states he has a family but does not know their plans for the holidays.  Patient states it is good to be in a place where people have similar problems and you don't feel like the only one.  Patient states he would like to learn how to regulate his emotions.  A: Staff to monitor Q 15 mins for safety.  Encouragement and support offered.  Scheduled medications administered per orders. R: Patient remains safe on the unit.  Patient calm, cooperative and taking administered medications.   Patient cbg rechecked tonight at 2256 and cbg was 159.

## 2012-04-01 NOTE — Tx Team (Signed)
Interdisciplinary Treatment Plan Update (Adult)  Date:  04/01/2012  Time Reviewed:  7:23 AM   Progress in Treatment: Attending groups: Yes Participating in groups:  Yes Taking medication as prescribed: Yes Tolerating medication:  Yes Family/Significant othe contact made:  no Patient understands diagnosis:  Yes Discussing patient identified problems/goals with staff:  Yes Medical problems stabilized or resolved:  Yes Denies suicidal/homicidal ideation: Yes Issues/concerns per patient self-inventory:  None identified Other: N/A  New problem(s) identified: None Identified  Reason for Continuation of Hospitalization: Anxiety Depression Medication stabilization  Interventions implemented related to continuation of hospitalization: mood stabilization, medication monitoring and adjustment, group therapy and psycho education, suicide risk assessment, collateral contact, aftercare planning, ongoing physician assessments and safety checks q 15 mins  Additional comments: N/A  Estimated length of stay: 3-5 days  Discharge Plan: CSW is assessing for appropriate referrals.    New goal(s): N/A  Review of initial/current patient goals per problem list:    1.  Goal(s): Address substance use by completing detox protocol  Met:  No  Target date: 04/05/12  As evidenced by: complete [facility] detox  2.  Goal (s): Reduce depressive symptoms from a 10 to a 3  Met:  No  Target date: 3-5 days  As evidenced by: Pt rates at a 9  3.  Goal (s): Reduce anxiety symptoms from a 10 to a 3  Met:  No  Target date:  3-5 days  As evidenced by: Pt rates at a  9  4.  Goal(s):  Met:  No  Target date:   As evidenced by:   Attendees: Patient:     Family:     Physician: Geoffery Lyons, MD 04/01/2012 7:23 AM   Nursing: Roswell Miners, RN 04/01/2012 7:23 AM   Clinical Social Worker:  Reyes Ivan, LCSWA 04/01/2012  7:23 AM   Other: Clinical Social Worker: Toll Brothers, LCSW 04/01/2012  7:23  AM   Other:     Other:     Other:     Other:      Scribe for Treatment Team:   Reyes Ivan 04/01/2012 7:23 AM

## 2012-04-01 NOTE — Progress Notes (Signed)
Adult Psychosocial Assessment Update Interdisciplinary Team  Previous [facility] admissions/discharges:  Admissions Discharges  Date:03/09/12 Date:03/17/12  Date: Date:  Date: Date:  Date: Date:  Date: Date:   Changes since the last Psychosocial Assessment (including adherence to outpatient mental health and/or substance abuse treatment, situational issues contributing to decompensation and/or relapse). Patient appeared flat, depressed.Discussed feeling that he let his family down.  Patient states that he didn't use the coping skills that he learned when he was here before. Patient states that he went home after previous admit and went back to his of routine of making his family friends the priority. Patient stated that he didn't follow up with [facility] that was previously arranged and did not obtain a sponsor. Per patient's report, he began to feeling increasingly depressed trying to handle all of his problems on his own and the cocaine and alcohol was not so much to make him feel better but to make it easier to self harm.             Discharge Plan 1. Will you be returning to the same living situation after discharge?   Yes: No:      If no, what is your plan?    Patient will return home with wife.  He plans to follow up at [facility] for outpatient follow up.       2. Would you like a referral for services when you are discharged? Yes:     If yes, for what services?  No:       Patietnt would like to follow up at [facility] for outpatient medication management and therapy.       Summary and Recommendations (to be completed by the evaluator) Increase stabilization of patient's mood, reduce potential for self-harm, assess for medication trial, address substance abuse issues and improve insight into his high risk behaviors.                       Signature:  Verna Czech Williams, 04/01/2012 10:04 AM

## 2012-04-01 NOTE — Progress Notes (Signed)
Psychoeducational Group Note  Date:  04/01/2012 Time:  2000  Group Topic/Focus:  Wrap-Up Group:   The focus of this group is to help patients review their daily goal of treatment and discuss progress on daily workbooks.  Participation Level:  Active  Participation Quality:  Appropriate and Sharing  Affect:  Appropriate and Depressed  Cognitive:  Appropriate  Insight:  Good  Engagement in Group:  Good  Additional Comments:  Patient appeared to have plenty to share about his struggle with recovery. Patient was appropriate.   Lyndee Hensen 04/01/2012, 11:09 PM

## 2012-04-01 NOTE — Progress Notes (Signed)
Patient came in requesting alcohol detox drinking about an 18 pack over a few days and reports to using crack cocaine. Patient reports decreased appetite and depression. Patient currently denies si/hi/a/v hallucinations. Medical hx include HTN, hypothyroidism, depression, anxiety and stroke.  Patient oriented to unit, safety maintained with 15 min checks.

## 2012-04-01 NOTE — Progress Notes (Signed)
Patient ID: Phillip Turner, male   DOB: Dec 16, 1960, 51 y.o.   MRN: [id number] He had been in bed first part of day and has been up and showered after lunch.  He spoke of not sleeping for 5 days prior to coming into here. Spoke of his not following discharge plan and not taking his medications.

## 2012-04-01 NOTE — Tx Team (Signed)
Initial Interdisciplinary Treatment Plan  PATIENT STRENGTHS: (choose at least two) Ability for insight Supportive family/friends  PATIENT STRESSORS: Medication change or noncompliance Substance abuse   PROBLEM LIST: Problem List/Patient Goals Date to be addressed Date deferred Reason deferred Estimated date of resolution  Depression 04/01/2012     Alcohol abuse      Crack abuse                                           DISCHARGE CRITERIA:  Ability to meet basic life and health needs Adequate post-discharge living arrangements Improved stabilization in mood, thinking, and/or behavior Need for constant or close observation no longer present Verbal commitment to aftercare and medication compliance  PRELIMINARY DISCHARGE PLAN: Return to previous living arrangement  PATIENT/FAMIILY INVOLVEMENT: This treatment plan has been presented to and reviewed with the patient, Phillip Turner, and/or family member.  The patient and family have been given the opportunity to ask questions and make suggestions.  Floyce Stakes 04/01/2012, 5:25 AM

## 2012-04-01 NOTE — Progress Notes (Signed)
Aftercare Planning Group 04/01/12 9:45am Patient did not attend aftercare planning group     Aris Georgia 04/01/2012, 7:16 AM

## 2012-04-02 DIAGNOSIS — F329 Major depressive disorder, single episode, unspecified: Secondary | ICD-10-CM

## 2012-04-02 LAB — GLUCOSE, CAPILLARY: Glucose-Capillary: 129 mg/dL — ABNORMAL HIGH (ref 70–99)

## 2012-04-02 LAB — BASIC METABOLIC PANEL
CO2: 17 mEq/L — ABNORMAL LOW (ref 19–32)
Chloride: 105 mEq/L (ref 96–112)
Creatinine, Ser: 1.54 mg/dL — ABNORMAL HIGH (ref 0.50–1.35)
Glucose, Bld: 65 mg/dL — ABNORMAL LOW (ref 70–99)

## 2012-04-02 NOTE — Progress Notes (Addendum)
D.  Pt in dayroom reading bible and talking with visitor.  No complaints voiced.  Flat affect but brightens with conversation.  Denies SI/HI/hallucinations at this time.  Interacting appropriately within milieu.  A.  Support and encouragement offered, medications given as ordered R.  In dayroom, no distress noted, will continue to monitor.  Pt did attend evening karoake group

## 2012-04-02 NOTE — Progress Notes (Signed)
[facility] MD Progress Note  04/02/2012 12:21 PM [address]  MRN:  [id number]  Diagnosis:  Alcohol Dependence, Cocaine Dependence, Major Depression, Substance Induced Mood Disorder  ADL's:  Intact  Sleep: Fair  Appetite:  Fair  Suicidal Ideation:  Plan:  Fleeting thoughts, no plans Intent:  Denies Means:  Denies Homicidal Ideation:  Plan:  Denies Intent:  Denies Means:  Denies  Sivan endorses that he has realized that he is not going to make it unless he speaks up when he needs help. Says he like to be in control. Asking for help means he gives control away. He admits the needs to work on humility, recognizing when he needs help. Does say this last experience was very dysphoric Mental Status Examination/Evaluation: Objective:  Appearance: Fairly Groomed  Eye Contact::  Minimal  Speech:  Clear and Coherent, Slow and not spontanous  Volume:  Decreased  Mood:  Depressed  Affect:  Restricted  Thought Process:  Coherent and Goal Directed  Orientation:  Full  Thought Content:  Rumination and shame, guilt for the relapse  Suicidal Thoughts:  Yes.  without intent/plan  Homicidal Thoughts:  No  Memory:  Immediate;   Fair Recent;   Fair Remote;   Fair  Judgement:  Intact  Insight:  Present  Psychomotor Activity:  Decreased  Concentration:  Fair  Recall:  Fair  Akathisia:  No  Handed:  Right  AIMS (if indicated):     Assets:  Communication Skills Desire for Improvement Social Support  Sleep:      Vital Signs:Blood pressure 136/85, pulse 52, temperature 97.3 F (36.3 C), temperature source Oral, resp. rate 16, height 5\' 9"  (1.753 m), weight 142.429 kg (314 lb). Current Medications: Current Facility-Administered Medications  Medication Dose Route Frequency Provider Last Rate Last Dose  . acetaminophen (TYLENOL) tablet 650 mg  650 mg Oral Q6H PRN Nanine Means, NP      . alum & mag hydroxide-simeth (MAALOX/MYLANTA) 200-200-20 MG/5ML suspension 30 mL  30 mL Oral Q4H PRN  Nanine Means, NP      . aspirin tablet 325 mg  325 mg Oral Daily Nanine Means, NP   325 mg at 04/02/12 0842  . chlordiazePOXIDE (LIBRIUM) capsule 25 mg  25 mg Oral Q6H PRN Nanine Means, NP      . [COMPLETED] chlordiazePOXIDE (LIBRIUM) capsule 25 mg  25 mg Oral QID Rachael Fee, MD   25 mg at 04/01/12 2140   Followed by  . chlordiazePOXIDE (LIBRIUM) capsule 25 mg  25 mg Oral TID Rachael Fee, MD       Followed by  . chlordiazePOXIDE (LIBRIUM) capsule 25 mg  25 mg Oral BH-qamhs Rachael Fee, MD       Followed by  . chlordiazePOXIDE (LIBRIUM) capsule 25 mg  25 mg Oral Daily Rachael Fee, MD      . cloNIDine (CATAPRES) tablet 0.1 mg  0.1 mg Oral QID Nanine Means, NP   0.1 mg at 04/01/12 2139   Followed by  . cloNIDine (CATAPRES) tablet 0.1 mg  0.1 mg Oral BH-qamhs Nanine Means, NP       Followed by  . cloNIDine (CATAPRES) tablet 0.1 mg  0.1 mg Oral QAC breakfast Nanine Means, NP      . colchicine tablet 0.6 mg  0.6 mg Oral Daily Nanine Means, NP   0.6 mg at 04/02/12 0842  . febuxostat (ULORIC) tablet 40 mg  40 mg Oral Daily Nanine Means, NP   40 mg at 04/02/12  8295  . FLUoxetine (PROZAC) capsule 40 mg  40 mg Oral Daily Nanine Means, NP   40 mg at 04/02/12 0842  . folic acid (FOLVITE) tablet 1 mg  1 mg Oral Daily Nanine Means, NP   1 mg at 04/02/12 0842  . furosemide (LASIX) tablet 20 mg  20 mg Oral Daily Nanine Means, NP   20 mg at 04/02/12 0842  . glyBURIDE (DIABETA) tablet 2.5 mg  2.5 mg Oral BID WC Nanine Means, NP   2.5 mg at 04/02/12 6213  . hydrOXYzine (ATARAX/VISTARIL) tablet 25 mg  25 mg Oral Q6H PRN Nanine Means, NP      . levothyroxine (SYNTHROID, LEVOTHROID) tablet 175 mcg  175 mcg Oral QAC breakfast Nanine Means, NP   175 mcg at 04/02/12 0636  . lisinopril (PRINIVIL,ZESTRIL) tablet 10 mg  10 mg Oral Daily Nanine Means, NP   10 mg at 04/01/12 1300  . loperamide (IMODIUM) capsule 2-4 mg  2-4 mg Oral PRN Nanine Means, NP      . magnesium hydroxide (MILK OF MAGNESIA) suspension 30 mL   30 mL Oral Daily PRN Nanine Means, NP      . metFORMIN (GLUCOPHAGE) tablet 1,000 mg  1,000 mg Oral BID WC Nanine Means, NP   1,000 mg at 04/02/12 0842  . multivitamin with minerals tablet 1 tablet  1 tablet Oral Daily Nanine Means, NP   1 tablet at 04/02/12 0843  . naproxen (NAPROSYN) tablet 500 mg  500 mg Oral BID WC Nanine Means, NP   500 mg at 04/02/12 0843  . ondansetron (ZOFRAN-ODT) disintegrating tablet 4 mg  4 mg Oral Q6H PRN Nanine Means, NP      . potassium chloride SA (K-DUR,KLOR-CON) CR tablet 20 mEq  20 mEq Oral Daily Nanine Means, NP   20 mEq at 04/02/12 0843  . QUEtiapine (SEROQUEL) tablet 150 mg  150 mg Oral QHS Nanine Means, NP   150 mg at 04/01/12 2139  . thiamine (B-1) injection 100 mg  100 mg Intramuscular Once Nanine Means, NP      . thiamine (VITAMIN B-1) tablet 100 mg  100 mg Oral Daily Nanine Means, NP   100 mg at 04/02/12 0842  . [DISCONTINUED] chlordiazePOXIDE (LIBRIUM) capsule 25 mg  25 mg Oral Once Nanine Means, NP      . [DISCONTINUED] gabapentin (NEURONTIN) capsule 300 mg  300 mg Oral TID Nanine Means, NP        Lab Results:  Results for orders placed during the hospital encounter of 04/01/12 (from the past 48 hour(s))  GLUCOSE, CAPILLARY     Status: Normal   Collection Time   04/01/12  4:46 AM      Component Value Range Comment   Glucose-Capillary 84  70 - 99 mg/dL   GLUCOSE, CAPILLARY     Status: Abnormal   Collection Time   04/01/12  9:11 PM      Component Value Range Comment   Glucose-Capillary 67 (*) 70 - 99 mg/dL    Comment 1 Notify RN     GLUCOSE, CAPILLARY     Status: Abnormal   Collection Time   04/01/12 10:56 PM      Component Value Range Comment   Glucose-Capillary 159 (*) 70 - 99 mg/dL   GLUCOSE, CAPILLARY     Status: Abnormal   Collection Time   04/02/12  6:44 AM      Component Value Range Comment   Glucose-Capillary 115 (*) 70 - 99 mg/dL  GLUCOSE, CAPILLARY     Status: Abnormal   Collection Time   04/02/12 11:10 AM      Component  Value Range Comment   Glucose-Capillary 135 (*) 70 - 99 mg/dL    Comment 1 Documented in Chart      Comment 2 Notify RN       Physical Findings: AIMS: Facial and Oral Movements Muscles of Facial Expression: None, normal Lips and Perioral Area: None, normal Jaw: None, normal Tongue: None, normal,Extremity Movements Upper (arms, wrists, hands, fingers): None, normal Lower (legs, knees, ankles, toes): None, normal, Trunk Movements Neck, shoulders, hips: None, normal, Overall Severity Severity of abnormal movements (highest score from questions above): None, normal Incapacitation due to abnormal movements: None, normal Patient's awareness of abnormal movements (rate only patient's report): No Awareness, Dental Status Current problems with teeth and/or dentures?: No Does patient usually wear dentures?: No  CIWA:  CIWA-Ar Total: 0  COWS:  COWS Total Score: 0   Treatment Plan Summary: Daily contact with patient to assess and evaluate symptoms and progress in treatment Medication management  Plan: Supportive approach/coping skills/relapse prevention           Reassess medications           Monitor BP/Pulse/K closely  Christepher Melchior A 04/02/2012, 12:21 PM

## 2012-04-02 NOTE — Progress Notes (Signed)
  D) Patient irritable and sullen upon my assessment. Patient states "I am too sleepy to go to groups today." Patient encouraged to attend groups "as he is able" patient verbalizes understanding. Patient states slept "fair," and  appetite is "good." Patient rates depression as   9/10, patient rates hopeless feelings as  9/10. Patient denies SI/HI, denies A/V hallucinations.   A) Patient offered support and encouragement, patient encouraged to discuss feelings/concerns with staff. Patient verbalized understanding. Patient monitored Q15 minutes for safety. Patient met with MD to discuss today's goals and plan of care.  R) Patient isolates to room at times, refuses to attend groups in day room also refused to attend breakfast in dining room this morning.  Patient taking medications as ordered. Will continue to monitor.

## 2012-04-02 NOTE — Progress Notes (Signed)
Pediatric orthopedic consult placed but notified by answering service that the MDs in the service do not do this, patient will go for an xray of his left shoulder, and further follow-up based on results

## 2012-04-03 DIAGNOSIS — F339 Major depressive disorder, recurrent, unspecified: Principal | ICD-10-CM

## 2012-04-03 LAB — GLUCOSE, CAPILLARY
Glucose-Capillary: 105 mg/dL — ABNORMAL HIGH (ref 70–99)
Glucose-Capillary: 89 mg/dL (ref 70–99)

## 2012-04-03 NOTE — Progress Notes (Signed)
Psychoeducational Group Note  Date:  04/03/2012 Time:  2000  Group Topic/Focus:  AA group  Participation Level:  Active  Participation Quality:  Appropriate  Affect:  Appropriate  Cognitive:  Alert  Insight:  Good  Engagement in Group:  Good  Additional Comments:    Reynol Arnone R 04/03/2012, 8:43 PM

## 2012-04-03 NOTE — Progress Notes (Signed)
[facility] Group Notes:  (Counselor/Nursing/MHT/Case Management/Adjunct)  04/03/2012 11:54 AM  Type of Therapy:  Psychoeducational Skills  Participation Level:  Did Not Attend  Modes of Intervention:  Activity, Education, Problem-solving and Socialization  Summary of Progress/Problems:Pt did not participate in activity called "Picture not so perfect".   Dalia Heading 04/03/2012, 11:54 AM

## 2012-04-03 NOTE — Progress Notes (Signed)
D Pt is seen OOB [facility] at 0900...he is quiet, keeps to himself  And cooperates with his POC.  A HE completes his AM self inventory, on it he writes he denies feeling SI within the past 24 hrs, and he rates his feeligns of depression and hopelessness "9/9" and states his DC plan is: " action for sobriety".  R Safety is in place and POC fostered via therapeutic relationship.

## 2012-04-03 NOTE — Clinical Social Work Note (Signed)
Aftercare Planning Group: 04/03/2012 9:45 AM  Pt attended discharge planning group and actively participated in group.  CSW provided pt with today's workbook.  Pt presents with flat affect and depressed mood.  Pt rates depression and anxiety at a 9 today.  Pt denies SI/HI.  When asked about his d/c plans, pt states that he has a plan but there is no point in sharing it because he needs to just do it.  Pt states that he will follow up at [facility] for medication management and therapy.  CSW will secure pt's follow up.  No further needs voiced by pt at this time.  Safety planning and suicide prevention discussed.  Pt participated in discussion and acknowledged an understanding of the information provided.       [facility] Group Note : Clinical Social Worker Group Therapy  04/03/2012  1:15 PM  Type of Therapy:  Group Therapy - Process Group  Participation Level:  Appropriate  Participation Quality:  Appropriate   Affect:  Depressed and Flat  Cognitive:  Alert  Insight:  Good  Engagement in Group:  Good  Engagement in Therapy:  Good  Modes of Intervention:  Support  Summary of Progress/Problems: The topic of today's group was "relapse prevention" . Patient discussed a learned childhood belief that one should always be strong and never show any signes of weakness.  Patient discussed how this view has had a negative impact on his adult life as he has learned to not show weakness by trying to handle his problems on his own and self-medicating to cope. Patient discussed a plan to be open about his addiction and not hide his feelings anymore. Patient emphasized the fact that he will follow up with his outpatient appointments and obtain a sponsor, something that he did not do in the past.  Phillip Turner, LCSWA 04/03/2012 3:00 pm

## 2012-04-03 NOTE — Tx Team (Signed)
Interdisciplinary Treatment Plan Update (Adult)  Date:  04/03/2012  Time Reviewed:  9:45 AM   Progress in Treatment: Attending groups: Yes Participating in groups:  Yes Taking medication as prescribed: Yes Tolerating medication:  Yes Family/Significant othe contact made:  CSW will assess for appropriate referrals Patient understands diagnosis:  Yes Discussing patient identified problems/goals with staff:  Yes Medical problems stabilized or resolved:  Yes Denies suicidal/homicidal ideation: Yes Issues/concerns per patient self-inventory:  None identified Other: N/A  New problem(s) identified: None Identified  Reason for Continuation of Hospitalization: Anxiety Depression Medication stabilization Withdrawal symptoms  Interventions implemented related to continuation of hospitalization: mood stabilization, medication monitoring and adjustment, group therapy and psycho education, suicide risk assessment, collateral contact, aftercare planning, ongoing physician assessments and safety checks q 15 mins  Additional comments: N/A  Estimated length of stay: 3-4 days  Discharge Plan: CSW is assessing for appropriate referrals.      New goal(s): N/A  Review of initial/current patient goals per problem list:    1.  Goal(s): Address substance use by completing detox protocol  Met:  No  Target date: 4 days  As evidenced by: Completes detox on 12/1  2.  Goal (s): Reduce depressive symptoms from a 10 to a 3  Met:  No  Target date: 3-5 days  As evidenced by: Pt rates at a 9 today.   3.  Goal (s): Reduce anxiety symptoms from a 10 to a 3  Met:  No  Target date:  3-5 days  As evidenced by: Pt rates at an 9 today.     Attendees: Patient:     Family:     Physician: Geoffery Lyons, MD 04/03/2012 9:45 AM   Nursing: Alease Frame, RN 04/03/2012 9:45 AM   Clinical Social Worker:  Reyes Ivan, LCSWA 04/03/2012  9:45 AM   Other: Shelda Jakes, RN 04/03/2012  9:45 AM   Other:      Other:     Other:     Other:      Scribe for Treatment Team:   Reyes Ivan 04/03/2012 9:45 AM

## 2012-04-03 NOTE — Progress Notes (Signed)
Patient ID: Phillip Turner, male   DOB: Aug 18, 1960, 51 y.o.   MRN: [id number] D: Patient in bed sleeping. Respiration regular and unlabored. No sign of distress noted at this time A: 15 mins checks for  safety R: Patient remains asleep.

## 2012-04-03 NOTE — Progress Notes (Signed)
Patient ID: Phillip Turner, male   DOB: [birth year], 51 y.o.   MRN: [id number] D: Patient in dayroom on approach. Pt presented with depressed mood and sad affect. Pt interacting appropriately with peers. Evening CBG was 68. No acute distressed noted. Denies SI/HI/AVH and pain. Cooperative with assessment.  A: Met with pt 1:1. Medications administered as prescribed. Pt given of apple juice. Rechecked CBG was 89. Pt educated about hypoglycemic symptoms. Safety has been maintained with Q15 minutes observation. Supported and encouragement provided.  Pt encouraged to come to staff with any question or concerns  R: Patient remains safe. He is complaint with medications and group programming. Safety has been maintained Q15 and continue current POC.

## 2012-04-03 NOTE — Progress Notes (Signed)
Phillip Turner Progress Note  04/03/2012 12:35 PM Phillip Turner  MRN:  [id number]  Diagnosis:  Alcohol, Cocaine Dependence, Major Depression recurrent  ADL's:  Intact  Sleep: Fair  Appetite:  Poor  Suicidal Ideation:  Plan:  Denies Intent:  Denies Means:  Denies Homicidal Ideation:  Plan:  Denies Intent:  Denies Means:  Denies  Still endorsing feeling down. He had a good visit with his wife who noted that he does not allow her be part of hid recovery. He admits that he was brought up by his father to not express feelings, or ask for help. Says that last time he did not tell his wife how he was feeling before the relapse. She is asking him to tell her when he is feeling like that. He says that he is willing to do it as his way has not help him be successful, so he is willing to try a different one  Mental Status Examination/Evaluation: Objective:  Appearance: Fairly Groomed  Patent attorney::  Fair  Speech:  Clear and Coherent and Slow  Volume:  Decreased  Mood:  Depressed  Affect:  Restricted  Thought Process:  Coherent and Goal Directed  Orientation:  Full  Thought Content:  Examining the way he was brought up, the messages he got fro his father and how this is affecting his ability to stay sober.  Suicidal Thoughts:  No  Homicidal Thoughts:  No  Memory:  Immediate;   Fair Recent;   Fair Remote;   Fair  Judgement:  Fair  Insight:  Present  Psychomotor Activity:  Decreased  Concentration:  Fair  Recall:  Fair  Akathisia:  No  Handed:  Right  AIMS (if indicated):     Assets:  Desire for Improvement Housing Social Support  Sleep:  Number of Hours: 5    Vital Signs:Blood pressure 164/88, pulse 67, temperature 97.3 F (36.3 C), temperature source Oral, resp. rate 20, height 5\' 9"  (1.753 m), weight 142.429 kg (314 lb). Current Medications: Current Facility-Administered Medications  Medication Dose Route Frequency Provider Last Rate Last Dose  . acetaminophen (TYLENOL) tablet  650 mg  650 mg Oral Q6H PRN Nanine Means, NP      . alum & mag hydroxide-simeth (MAALOX/MYLANTA) 200-200-20 MG/5ML suspension 30 mL  30 mL Oral Q4H PRN Nanine Means, NP      . aspirin tablet 325 mg  325 mg Oral Daily Nanine Means, NP   325 mg at 04/03/12 0811  . chlordiazePOXIDE (LIBRIUM) capsule 25 mg  25 mg Oral Q6H PRN Nanine Means, NP      . [EXPIRED] chlordiazePOXIDE (LIBRIUM) capsule 25 mg  25 mg Oral TID Rachael Fee, Turner       Followed by  . chlordiazePOXIDE (LIBRIUM) capsule 25 mg  25 mg Oral BH-qamhs Rachael Fee, Turner   25 mg at 04/03/12 0813   Followed by  . chlordiazePOXIDE (LIBRIUM) capsule 25 mg  25 mg Oral Daily Rachael Fee, Turner      . [EXPIRED] cloNIDine (CATAPRES) tablet 0.1 mg  0.1 mg Oral QID Nanine Means, NP   0.1 mg at 04/01/12 2139   Followed by  . cloNIDine (CATAPRES) tablet 0.1 mg  0.1 mg Oral BH-qamhs Nanine Means, NP   0.1 mg at 04/03/12 3235   Followed by  . cloNIDine (CATAPRES) tablet 0.1 mg  0.1 mg Oral QAC breakfast Nanine Means, NP      . colchicine tablet 0.6 mg  0.6 mg Oral Daily Catha Nottingham  Lord, NP   0.6 mg at 04/03/12 0811  . febuxostat (ULORIC) tablet 40 mg  40 mg Oral Daily Nanine Means, NP   40 mg at 04/03/12 0811  . FLUoxetine (PROZAC) capsule 40 mg  40 mg Oral Daily Nanine Means, NP   40 mg at 04/03/12 0811  . folic acid (FOLVITE) tablet 1 mg  1 mg Oral Daily Nanine Means, NP   1 mg at 04/03/12 0813  . furosemide (LASIX) tablet 20 mg  20 mg Oral Daily Nanine Means, NP   20 mg at 04/03/12 0811  . glyBURIDE (DIABETA) tablet 2.5 mg  2.5 mg Oral BID WC Nanine Means, NP   2.5 mg at 04/03/12 1610  . hydrOXYzine (ATARAX/VISTARIL) tablet 25 mg  25 mg Oral Q6H PRN Nanine Means, NP      . levothyroxine (SYNTHROID, LEVOTHROID) tablet 175 mcg  175 mcg Oral QAC breakfast Nanine Means, NP   175 mcg at 04/03/12 [phone]  . lisinopril (PRINIVIL,ZESTRIL) tablet 10 mg  10 mg Oral Daily Nanine Means, NP   10 mg at 04/03/12 5409  . loperamide (IMODIUM) capsule 2-4 mg  2-4 mg Oral  PRN Nanine Means, NP      . magnesium hydroxide (MILK OF MAGNESIA) suspension 30 mL  30 mL Oral Daily PRN Nanine Means, NP      . metFORMIN (GLUCOPHAGE) tablet 1,000 mg  1,000 mg Oral BID WC Nanine Means, NP   1,000 mg at 04/03/12 0811  . multivitamin with minerals tablet 1 tablet  1 tablet Oral Daily Nanine Means, NP   1 tablet at 04/03/12 0811  . naproxen (NAPROSYN) tablet 500 mg  500 mg Oral BID WC Nanine Means, NP   500 mg at 04/03/12 0811  . ondansetron (ZOFRAN-ODT) disintegrating tablet 4 mg  4 mg Oral Q6H PRN Nanine Means, NP      . potassium chloride SA (K-DUR,KLOR-CON) CR tablet 20 mEq  20 mEq Oral Daily Nanine Means, NP   20 mEq at 04/03/12 0811  . QUEtiapine (SEROQUEL) tablet 150 mg  150 mg Oral QHS Nanine Means, NP   150 mg at 04/02/12 2140  . thiamine (B-1) injection 100 mg  100 mg Intramuscular Once Nanine Means, NP      . thiamine (VITAMIN B-1) tablet 100 mg  100 mg Oral Daily Nanine Means, NP   100 mg at 04/03/12 8119    Lab Results:  Results for orders placed during the hospital encounter of 04/01/12 (from the past 48 hour(s))  GLUCOSE, CAPILLARY     Status: Abnormal   Collection Time   04/01/12  9:11 PM      Component Value Range Comment   Glucose-Capillary 67 (*) 70 - 99 mg/dL    Comment 1 Notify RN     GLUCOSE, CAPILLARY     Status: Abnormal   Collection Time   04/01/12 10:56 PM      Component Value Range Comment   Glucose-Capillary 159 (*) 70 - 99 mg/dL   GLUCOSE, CAPILLARY     Status: Abnormal   Collection Time   04/02/12  6:44 AM      Component Value Range Comment   Glucose-Capillary 115 (*) 70 - 99 mg/dL   GLUCOSE, CAPILLARY     Status: Abnormal   Collection Time   04/02/12 11:10 AM      Component Value Range Comment   Glucose-Capillary 135 (*) 70 - 99 mg/dL    Comment 1 Documented in Chart  Comment 2 Notify RN     GLUCOSE, CAPILLARY     Status: Abnormal   Collection Time   04/02/12  4:59 PM      Component Value Range Comment   Glucose-Capillary  129 (*) 70 - 99 mg/dL    Comment 1 Notify RN     BASIC METABOLIC PANEL     Status: Abnormal   Collection Time   04/02/12  8:11 PM      Component Value Range Comment   Sodium 144  135 - 145 mEq/L    Potassium 3.7  3.5 - 5.1 mEq/L    Chloride 105  96 - 112 mEq/L    CO2 17 (*) 19 - 32 mEq/L    Glucose, Bld 65 (*) 70 - 99 mg/dL    BUN 11  6 - 23 mg/dL    Creatinine, Ser 1.61 (*) 0.50 - 1.35 mg/dL    Calcium 9.5  8.4 - 09.6 mg/dL    GFR calc non Af Amer 51 (*) >90 mL/min    GFR calc Af Amer 59 (*) >90 mL/min   GLUCOSE, CAPILLARY     Status: Normal   Collection Time   04/02/12  9:43 PM      Component Value Range Comment   Glucose-Capillary 80  70 - 99 mg/dL    Comment 1 Notify RN     GLUCOSE, CAPILLARY     Status: Abnormal   Collection Time   04/03/12  6:01 AM      Component Value Range Comment   Glucose-Capillary 105 (*) 70 - 99 mg/dL     Physical Findings: AIMS: Facial and Oral Movements Muscles of Facial Expression: None, normal Lips and Perioral Area: None, normal Jaw: None, normal Tongue: None, normal,Extremity Movements Upper (arms, wrists, hands, fingers): None, normal Lower (legs, knees, ankles, toes): None, normal, Trunk Movements Neck, shoulders, hips: None, normal, Overall Severity Severity of abnormal movements (highest score from questions above): None, normal Incapacitation due to abnormal movements: None, normal Patient's awareness of abnormal movements (rate only patient's report): No Awareness, Dental Status Current problems with teeth and/or dentures?: No Does patient usually wear dentures?: No  CIWA:  CIWA-Ar Total: 0  COWS:  COWS Total Score: 0   Treatment Plan Summary: Daily contact with patient to assess and evaluate symptoms and progress in treatment Medication management  Plan: Supportive approach/coping skills/relapse prevention           Challenge the distortions/CBT           Keep an eye on his BP  Kennan Detter A 04/03/2012, 12:35 PM

## 2012-04-04 LAB — GLUCOSE, CAPILLARY
Glucose-Capillary: 124 mg/dL — ABNORMAL HIGH (ref 70–99)
Glucose-Capillary: 76 mg/dL (ref 70–99)
Glucose-Capillary: 62 mg/dL — ABNORMAL LOW (ref 70–99)

## 2012-04-04 MED ORDER — QUETIAPINE FUMARATE 50 MG PO TABS
150.0000 mg | ORAL_TABLET | Freq: Every day | ORAL | Status: DC
  Administered 2012-04-05 – 2012-04-06 (×2): 150 mg via ORAL
  Filled 2012-04-04 (×4): qty 1

## 2012-04-04 NOTE — Progress Notes (Signed)
Patient ID: Phillip Turner, male   DOB: June 28, 1960, 51 y.o.   MRN: [id number] [facility] MD Progress Note  04/04/2012 12:54 PM Olee Diclemente  MRN:  [id number]  Diagnosis:  Alcohol, Cocaine Dependence, Major Depression recurrent Subjective: Met with the patient 1:1 today to discuss his care. He has multiple concerns about his metformin as his blood sugars are coming down. He states that the glyburide works better.  He also feels that his lisinopril needs to be increased because the clonidine is being decreased. ADL's:  Intact  Sleep: Fair  Appetite:  Poor  Suicidal Ideation:  Plan:  Denies Intent:  Denies Means:  Denies Homicidal Ideation:  Plan:  Denies Intent:  Denies Means:  Denies  Mental Status Examination/Evaluation: Objective:  Appearance: Fairly Groomed  Patent attorney::  Fair  Speech:  Clear and goal directed  Volume:  Decreased  Mood:  Depressed and a little irritable  Affect:  Restricted  Thought Process:  Coherent and Goal Directed  Orientation:  Full  Thought Content: focused on his medication and his overall health  Suicidal Thoughts:  No  Homicidal Thoughts:  No  Memory:  Immediate;   Fair Recent;   Fair Remote;   Fair  Judgement:  Fair  Insight:  Present  Psychomotor Activity:  Decreased  Concentration:  Fair  Recall:  Fair  Akathisia:  No  Handed:  Right  AIMS (if indicated):     Assets:  Desire for Improvement Housing Social Support  Sleep:  Number of Hours: 5.75    Vital Signs:Blood pressure 141/69, pulse 51, temperature 98 F (36.7 C), temperature source Oral, resp. rate 18, height 5\' 9"  (1.753 m), weight 142.429 kg (314 lb). Current Medications: Current Facility-Administered Medications  Medication Dose Route Frequency Provider Last Rate Last Dose  . acetaminophen (TYLENOL) tablet 650 mg  650 mg Oral Q6H PRN Nanine Means, NP      . alum & mag hydroxide-simeth (MAALOX/MYLANTA) 200-200-20 MG/5ML suspension 30 mL  30 mL Oral Q4H PRN Nanine Means, NP       . aspirin tablet 325 mg  325 mg Oral Daily Nanine Means, NP   325 mg at 04/04/12 0816  . [EXPIRED] chlordiazePOXIDE (LIBRIUM) capsule 25 mg  25 mg Oral Q6H PRN Nanine Means, NP      . [EXPIRED] chlordiazePOXIDE (LIBRIUM) capsule 25 mg  25 mg Oral BH-qamhs Rachael Fee, MD   25 mg at 04/03/12 0813   Followed by  . chlordiazePOXIDE (LIBRIUM) capsule 25 mg  25 mg Oral Daily Rachael Fee, MD      . cloNIDine (CATAPRES) tablet 0.1 mg  0.1 mg Oral BH-qamhs Nanine Means, NP   0.1 mg at 04/04/12 2951   Followed by  . cloNIDine (CATAPRES) tablet 0.1 mg  0.1 mg Oral QAC breakfast Nanine Means, NP      . colchicine tablet 0.6 mg  0.6 mg Oral Daily Nanine Means, NP   0.6 mg at 04/04/12 0823  . febuxostat (ULORIC) tablet 40 mg  40 mg Oral Daily Nanine Means, NP   40 mg at 04/04/12 0823  . FLUoxetine (PROZAC) capsule 40 mg  40 mg Oral Daily Nanine Means, NP   40 mg at 04/04/12 0824  . folic acid (FOLVITE) tablet 1 mg  1 mg Oral Daily Nanine Means, NP   1 mg at 04/04/12 8841  . furosemide (LASIX) tablet 20 mg  20 mg Oral Daily Nanine Means, NP   20 mg at 04/04/12 0824  . glyBURIDE (DIABETA)  tablet 2.5 mg  2.5 mg Oral BID WC Nanine Means, NP   2.5 mg at 04/04/12 0830  . [EXPIRED] hydrOXYzine (ATARAX/VISTARIL) tablet 25 mg  25 mg Oral Q6H PRN Nanine Means, NP      . levothyroxine (SYNTHROID, LEVOTHROID) tablet 175 mcg  175 mcg Oral QAC breakfast Nanine Means, NP   175 mcg at 04/04/12 0659  . lisinopril (PRINIVIL,ZESTRIL) tablet 10 mg  10 mg Oral Daily Nanine Means, NP   10 mg at 04/04/12 0830  . [EXPIRED] loperamide (IMODIUM) capsule 2-4 mg  2-4 mg Oral PRN Nanine Means, NP      . magnesium hydroxide (MILK OF MAGNESIA) suspension 30 mL  30 mL Oral Daily PRN Nanine Means, NP      . metFORMIN (GLUCOPHAGE) tablet 1,000 mg  1,000 mg Oral BID WC Nanine Means, NP   1,000 mg at 04/04/12 0830  . multivitamin with minerals tablet 1 tablet  1 tablet Oral Daily Nanine Means, NP   1 tablet at 04/04/12 0830  . naproxen  (NAPROSYN) tablet 500 mg  500 mg Oral BID WC Nanine Means, NP   500 mg at 04/04/12 0830  . [EXPIRED] ondansetron (ZOFRAN-ODT) disintegrating tablet 4 mg  4 mg Oral Q6H PRN Nanine Means, NP      . potassium chloride SA (K-DUR,KLOR-CON) CR tablet 20 mEq  20 mEq Oral Daily Nanine Means, NP   20 mEq at 04/04/12 0830  . QUEtiapine (SEROQUEL) tablet 150 mg  150 mg Oral QHS Nanine Means, NP   150 mg at 04/03/12 2116  . thiamine (B-1) injection 100 mg  100 mg Intramuscular Once Nanine Means, NP      . thiamine (VITAMIN B-1) tablet 100 mg  100 mg Oral Daily Nanine Means, NP   100 mg at 04/04/12 0830    Lab Results:  Results for orders placed during the hospital encounter of 04/01/12 (from the past 48 hour(s))  GLUCOSE, CAPILLARY     Status: Abnormal   Collection Time   04/02/12  4:59 PM      Component Value Range Comment   Glucose-Capillary 129 (*) 70 - 99 mg/dL    Comment 1 Notify RN     BASIC METABOLIC PANEL     Status: Abnormal   Collection Time   04/02/12  8:11 PM      Component Value Range Comment   Sodium 144  135 - 145 mEq/L    Potassium 3.7  3.5 - 5.1 mEq/L    Chloride 105  96 - 112 mEq/L    CO2 17 (*) 19 - 32 mEq/L    Glucose, Bld 65 (*) 70 - 99 mg/dL    BUN 11  6 - 23 mg/dL    Creatinine, Ser 4.78 (*) 0.50 - 1.35 mg/dL    Calcium 9.5  8.4 - 29.5 mg/dL    GFR calc non Af Amer 51 (*) >90 mL/min    GFR calc Af Amer 59 (*) >90 mL/min   GLUCOSE, CAPILLARY     Status: Normal   Collection Time   04/02/12  9:43 PM      Component Value Range Comment   Glucose-Capillary 80  70 - 99 mg/dL    Comment 1 Notify RN     GLUCOSE, CAPILLARY     Status: Abnormal   Collection Time   04/03/12  6:01 AM      Component Value Range Comment   Glucose-Capillary 105 (*) 70 - 99 mg/dL   GLUCOSE, CAPILLARY  Status: Abnormal   Collection Time   04/03/12  5:05 PM      Component Value Range Comment   Glucose-Capillary 117 (*) 70 - 99 mg/dL   GLUCOSE, CAPILLARY     Status: Abnormal   Collection Time    04/03/12  9:06 PM      Component Value Range Comment   Glucose-Capillary 68 (*) 70 - 99 mg/dL   GLUCOSE, CAPILLARY     Status: Normal   Collection Time   04/03/12 11:06 PM      Component Value Range Comment   Glucose-Capillary 89  70 - 99 mg/dL   GLUCOSE, CAPILLARY     Status: Normal   Collection Time   04/04/12  6:14 AM      Component Value Range Comment   Glucose-Capillary 92  70 - 99 mg/dL   GLUCOSE, CAPILLARY     Status: Normal   Collection Time   04/04/12  7:55 AM      Component Value Range Comment   Glucose-Capillary 95  70 - 99 mg/dL   GLUCOSE, CAPILLARY     Status: Abnormal   Collection Time   04/04/12 11:59 AM      Component Value Range Comment   Glucose-Capillary 124 (*) 70 - 99 mg/dL    Comment 1 Notify RN       Physical Findings: AIMS: Facial and Oral Movements Muscles of Facial Expression: None, normal Lips and Perioral Area: None, normal Jaw: None, normal Tongue: None, normal,Extremity Movements Upper (arms, wrists, hands, fingers): None, normal Lower (legs, knees, ankles, toes): None, normal, Trunk Movements Neck, shoulders, hips: None, normal, Overall Severity Severity of abnormal movements (highest score from questions above): None, normal Incapacitation due to abnormal movements: None, normal Patient's awareness of abnormal movements (rate only patient's report): No Awareness, Dental Status Current problems with teeth and/or dentures?: No Does patient usually wear dentures?: No  CIWA:  CIWA-Ar Total: 0  COWS:  COWS Total Score: 0   Treatment Plan Summary: Daily contact with patient to assess and evaluate symptoms and progress in treatment Medication management  Plan: Supportive approach/coping skills/relapse prevention 1. Labs and medications reviewed.  His last HgbA1c was 8.0 done on 03/08/12 so will not repeat this. 2. Will continue to monitor CBGs at this time with no further changes. 3. Will monitor.     Rona Ravens. Khristian Phillippi PAC        04/04/2012, 12:54 PM

## 2012-04-04 NOTE — Progress Notes (Signed)
D.  Pt pleasant and bright on approach.  Denies SI/HI/hallucinations at this time.  Interacting appropriately within milieu.  Positive for evening AA group.  Pt stated that he would like to take his seroquel at 2000 instead of 2200 because it takes too long to take effect and he ends up lying in bed with racing thoughts and unable to sleep for first part of night.  Pt also would like the doctor to review his diabetic and BP medications since his blood sugar has been low and his blood pressure has been high consistently.  A.  Doctor on call notified of Pt's medication requests and orders received.  Support and encouragement offered  R.  Pt pleased with medication change, in dayroom watching TV with peers, no distress noted.  Will continue to monitor.

## 2012-04-04 NOTE — Progress Notes (Signed)
Group Topic/Focus:  Identifying Needs:   The focus of this group is to help patients identify their personal needs that have been historically problematic and identify healthy behaviors to address their needs.  Participation Level:  Active  Participation Quality:  Appropriate  Affect:  Appropriate  Cognitive:  Appropriate  Insight:  Good  Engagement in Group:  Good  Additional Comments:    Edwing Figley A  

## 2012-04-04 NOTE — Progress Notes (Addendum)
Pt chose to sleep in this am and did not want breakfast. Instructed pt of the importance of eating three meals a day while taking meds for diabetes. Pt concerned that metformin really does not work and stated he thinks if he goes off metformin and just takes -an increased dose glyberide   his sugars would be regulated.Pt also expressed concern about his BP as well stating his meds need to be adjusted and he does not like the  way his BP numbers are. Pt denies any chest pain .He does appear very labile and speech is very slow. Good eye contact. Pt contracts for safety. No Si or hI. Pts FSBS this am, was 92. Pt refused libruim this am but would not tell the nurse why he chose not to take it. Will continue to monitor-pt remains safe with q15 minute checks. Pt participated in  The life skills group and acknowledged that he feels society labels people with drug and ETOH problems.Pt interacted with the other pts and appeared to enjoy the group. He later asked the group leader to let him back in the room as he wanted to write down everything that was  On the board. The nurse assisted pt in doing this so he did not miss lunch . The pt stated he had suffered from a stroke in the past and it is much easier for him to understand things once they are written down so he can visually see them.Pt is very pleasant and cooperative and appears vested in his treatment here.

## 2012-04-04 NOTE — Clinical Social Work Note (Signed)
[facility] Group Notes:  (Clinical Social Work)  04/04/2012  10:00-11:00AM  Summary of Progress/Problems:   The main focus of today's process group was for the patient to identify ways in which they have in the past sabotaged their own recovery and reasons they may have done this/what they received from doing it.  We then worked to identify a specific plan to avoid doing this when discharged from the hospital for this admission.  The patient expressed that when he was here two weeks ago, he was "just going through the motions."  He said that when he handles the finances, he will always save money to spend on drugs.  Therefore, he is turning over the finances to his wife as long as is needed.  He feels this makes him rely on her like a child, but he has placed himself in this position.  He will also not go anywhere by himself, because when he drives the car, he goes to the neighborhoods where he knows he can purchase drugs, almost as a test to himself.  He will be transported around, instead, by his wife or daughter.  Type of Therapy:  Group Therapy - Process  Participation Level:  Active  Participation Quality:  Appropriate, Attentive, Sharing and Supportive  Affect:  Blunted  Cognitive:  Alert, Appropriate and Oriented  Insight:  Good  Engagement in Group:  Good  Engagement in Therapy:  Good  Modes of Intervention:  Clarification, Education, Limit-setting, Problem-solving, Socialization, Support and Processing   Ambrose Mantle, LCSW 04/04/2012

## 2012-04-05 DIAGNOSIS — F1010 Alcohol abuse, uncomplicated: Secondary | ICD-10-CM

## 2012-04-05 DIAGNOSIS — F1999 Other psychoactive substance use, unspecified with unspecified psychoactive substance-induced disorder: Secondary | ICD-10-CM

## 2012-04-05 DIAGNOSIS — F1910 Other psychoactive substance abuse, uncomplicated: Secondary | ICD-10-CM

## 2012-04-05 LAB — GLUCOSE, CAPILLARY
Glucose-Capillary: 89 mg/dL (ref 70–99)
Glucose-Capillary: 71 mg/dL (ref 70–99)
Glucose-Capillary: 69 mg/dL — ABNORMAL LOW (ref 70–99)
Glucose-Capillary: 80 mg/dL (ref 70–99)

## 2012-04-05 MED ORDER — CLONIDINE HCL 0.1 MG PO TABS
0.1000 mg | ORAL_TABLET | Freq: Once | ORAL | Status: AC
  Administered 2012-04-05: 0.1 mg via ORAL
  Filled 2012-04-05: qty 1

## 2012-04-05 NOTE — Progress Notes (Signed)
D.  Pt pleasant on approach, very pleased to learn that his Seroquel is scheduled for 2000.  Pt is concerned about his continued high BP and low HR. Pt states that he was told to no longer take Clonidine by his home provider due to low HR.  Pt would like it to be considered by doctor to increase his Lisinopril as this worked for him at home.   Pt is not symptomatic.  Denies SI/HI/hallucinations at this time.  Interacting appropriately within milieu.  Positive for evening AA group with good participation.  A.  Doctor [place] notified of high BP with orders received.  Support and encouragement offered.  R.  Pt in not apparent distress in dayroom watching TV.  Will continue to monitor.

## 2012-04-05 NOTE — Progress Notes (Addendum)
Pt s clonidine was held due to low BP this am and low heart rate. Pt denies any chest pain and states he feel good. Pt seen by two NP's this am regarding his BP and his diabetic meds. Pt denies any SI or HI and contracts for safety. Pt states his depression is a 7/10 today. He does c/o feeling some agitation and was given his am meds. Pt presently is in the goals group. Refused to go to breakfast this am the patient does appear vested inn his treatment and does attend all of his groups. Remains on q15 minute checks and remains safe. NP made aware pt is refusing to eat any meals stating ,"I am not hungry and my sugars are fine,." Pt was in his room extremely short of breath and stated ,"I am fine I have been exercising." Pt appears very arrogant now and refused to even go sit in the cafeteria even though Bernerd Limbo , [place] wanted the pt to go down with the other pts. Pt made aware with taking diabetes medication it is important to eat regular meals. Pt would not tell the nurse why he did not want to go to the cafeteria other  Than to say ,"I am not hungry and you can not make me eat."nurse Thayer Ohm went to talk to pt and he told her he would plan to go to the cafeteria for dinner.

## 2012-04-05 NOTE — Clinical Social Work Note (Signed)
[facility] Group Notes:  (Clinical Social Work)  04/05/2012  10:00-11:00AM  Summary of Progress/Problems:   The main focus of today's process group was for the patient to define "support" and describe what healthy supports are, then to identify the patient's current support system and decide on other supports that can be put in place to prevent future hospitalizations.   An emphasis was placed on using therapist, doctor and problem-specific support groups to expand supports.  The patient expressed that the last time he was at the hospital, he received the tools for sobriety but did not use them.  This time he is actively seeking sobriety.  He will go to [employer] daily, sometimes twice daily if needed.  He will get a sponsor.  He has always tried to be his own sponsor in the past.  He has told his wife everything.  There was a great deal of discussion about adding church, services to the AA group and/or community, psychiatrist, support groups, and patient was open and enthusiastic about all.  Type of Therapy:  Group Therapy  Participation Level:  Active  Participation Quality:  Appropriate, Attentive, Sharing and Supportive  Affect:  Appropriate  Cognitive:  Alert, Appropriate and Oriented  Insight:  Good  Engagement in Group:  Good  Engagement in Therapy:  Good  Modes of Intervention:  Clarification, Education, Limit-setting, Problem-solving, Socialization, Support and Processing   Phillip Mantle, LCSW 04/05/2012,

## 2012-04-05 NOTE — Progress Notes (Signed)
Psychoeducational Group Note  Date:  04/05/2012 Time:  1015  Group Topic/Focus:  Making Healthy Choices:   The focus of this group is to help patients identify negative/unhealthy choices they were using prior to admission and identify positive/healthier coping strategies to replace them upon discharge.  Participation Level:  Did not attend  Jamaiyah Pyle A  

## 2012-04-05 NOTE — Progress Notes (Signed)
[facility] Group Notes:  (Counselor/Nursing/MHT/Case Management/Adjunct)  04/04/2012 2100 Type of Therapy:  wrap up group  Participation Level:  Active  Participation Quality:  Appropriate, Attentive, Sharing and Supportive  Affect:  Appropriate  Cognitive:  Alert and Appropriate  Insight:  Good  Engagement in Group:  Good  Engagement in Therapy:  Good  Modes of Intervention:  Clarification, Education and Support  Summary of Progress/Problems:Pt reports that his family is beginning to " accept that I am only human".  Pt shares that he has had an attitude change resulting in "loosing confidence" in a good way.  Pt reports being someone whom you could tell nothing to because pt already knew everything.  Pt is learning to communicate by "talking with people and not at them" and also "listening to what others have to say".     Shelah Lewandowsky 04/05/2012, 3:12 AM

## 2012-04-05 NOTE — Progress Notes (Signed)
[facility] MD Progress Note  04/05/2012 1:00 PM Phillip Turner  MRN:  [id number]  Diagnosis:   Axis I: Alcohol Abuse, Depressive Disorder NOS, Substance Abuse and Substance Induced Mood Disorder Axis II: Deferred Axis III:  Past Medical History  Diagnosis Date  . Diabetes mellitus   . Hypertension   . Hypothyroidism   . Anxiety   . Depression   . Stroke     does not remember when   Axis IV: economic problems, other psychosocial or environmental problems, problems related to social environment and problems with primary support group Axis V: 41-50 serious symptoms  ADL's:  Intact  Sleep: Good  Appetite:  Poor  Suicidal Ideation:  Denies Homicidal Ideation:  Denies  Mental Status Examination/Evaluation: Objective:  Appearance: Casual  Eye Contact::  Good  Speech:  Normal Rate  Volume:  Normal  Mood:  Euthymic  Affect:  Congruent  Thought Process:  Intact and Logical  Orientation:  Full  Thought Content:  WDL  Suicidal Thoughts:  No  Homicidal Thoughts:  No  Memory:  Immediate;   Fair Recent;   Fair Remote;   Fair  Judgement:  Fair  Insight:  Fair  Psychomotor Activity:  Normal  Concentration:  Fair  Recall:  Fair  Akathisia:  No  Handed:  Right  AIMS (if indicated):     Assets:  Communication Skills Housing Leisure Time Social Support  Sleep:  Number of Hours: 5.25    Vital Signs:Blood pressure 137/71, pulse 43, temperature 97.5 F (36.4 C), temperature source Oral, resp. rate 18, height 5\' 9"  (1.753 m), weight 142.429 kg (314 lb). Current Medications: Current Facility-Administered Medications  Medication Dose Route Frequency Provider Last Rate Last Dose  . acetaminophen (TYLENOL) tablet 650 mg  650 mg Oral Q6H PRN Nanine Means, NP      . alum & mag hydroxide-simeth (MAALOX/MYLANTA) 200-200-20 MG/5ML suspension 30 mL  30 mL Oral Q4H PRN Nanine Means, NP      . aspirin tablet 325 mg  325 mg Oral Daily Nanine Means, NP   325 mg at 04/05/12 0849  . [EXPIRED]  chlordiazePOXIDE (LIBRIUM) capsule 25 mg  25 mg Oral Daily Rachael Fee, MD      . [address] cloNIDine (CATAPRES) tablet 0.1 mg  0.1 mg Oral BH-qamhs Nanine Means, NP   0.1 mg at 04/04/12 2149   Followed by  . cloNIDine (CATAPRES) tablet 0.1 mg  0.1 mg Oral QAC breakfast Nanine Means, NP      . colchicine tablet 0.6 mg  0.6 mg Oral Daily Nanine Means, NP   0.6 mg at 04/05/12 0851  . febuxostat (ULORIC) tablet 40 mg  40 mg Oral Daily Nanine Means, NP   40 mg at 04/05/12 0851  . FLUoxetine (PROZAC) capsule 40 mg  40 mg Oral Daily Nanine Means, NP   40 mg at 04/05/12 0851  . folic acid (FOLVITE) tablet 1 mg  1 mg Oral Daily Nanine Means, NP   1 mg at 04/05/12 4098  . furosemide (LASIX) tablet 20 mg  20 mg Oral Daily Nanine Means, NP   20 mg at 04/05/12 0852  . glyBURIDE (DIABETA) tablet 2.5 mg  2.5 mg Oral BID WC Nanine Means, NP   2.5 mg at 04/05/12 0852  . levothyroxine (SYNTHROID, LEVOTHROID) tablet 175 mcg  175 mcg Oral QAC breakfast Nanine Means, NP   175 mcg at 04/05/12 0612  . lisinopril (PRINIVIL,ZESTRIL) tablet 10 mg  10 mg Oral Daily Nanine Means, NP  10 mg at 04/05/12 0853  . magnesium hydroxide (MILK OF MAGNESIA) suspension 30 mL  30 mL Oral Daily PRN Nanine Means, NP      . metFORMIN (GLUCOPHAGE) tablet 1,000 mg  1,000 mg Oral BID WC Nanine Means, NP   1,000 mg at 04/05/12 0853  . multivitamin with minerals tablet 1 tablet  1 tablet Oral Daily Nanine Means, NP   1 tablet at 04/05/12 0853  . naproxen (NAPROSYN) tablet 500 mg  500 mg Oral BID WC Nanine Means, NP   500 mg at 04/05/12 0854  . potassium chloride SA (K-DUR,KLOR-CON) CR tablet 20 mEq  20 mEq Oral Daily Nanine Means, NP   20 mEq at 04/05/12 0854  . QUEtiapine (SEROQUEL) tablet 150 mg  150 mg Oral Q2000 Nanine Means, NP      . thiamine (B-1) injection 100 mg  100 mg Intramuscular Once Nanine Means, NP      . thiamine (VITAMIN B-1) tablet 100 mg  100 mg Oral Daily Nanine Means, NP   100 mg at 04/05/12 0854  . [DISCONTINUED]  QUEtiapine (SEROQUEL) tablet 150 mg  150 mg Oral QHS Nanine Means, NP   150 mg at 04/04/12 2119    Lab Results:  Results for orders placed during the hospital encounter of 04/01/12 (from the past 48 hour(s))  GLUCOSE, CAPILLARY     Status: Abnormal   Collection Time   04/03/12  5:05 PM      Component Value Range Comment   Glucose-Capillary 117 (*) 70 - 99 mg/dL   GLUCOSE, CAPILLARY     Status: Abnormal   Collection Time   04/03/12  9:06 PM      Component Value Range Comment   Glucose-Capillary 68 (*) 70 - 99 mg/dL   GLUCOSE, CAPILLARY     Status: Normal   Collection Time   04/03/12 11:06 PM      Component Value Range Comment   Glucose-Capillary 89  70 - 99 mg/dL   GLUCOSE, CAPILLARY     Status: Normal   Collection Time   04/04/12  6:14 AM      Component Value Range Comment   Glucose-Capillary 92  70 - 99 mg/dL   GLUCOSE, CAPILLARY     Status: Normal   Collection Time   04/04/12  7:55 AM      Component Value Range Comment   Glucose-Capillary 95  70 - 99 mg/dL   GLUCOSE, CAPILLARY     Status: Abnormal   Collection Time   04/04/12 11:59 AM      Component Value Range Comment   Glucose-Capillary 124 (*) 70 - 99 mg/dL    Comment 1 Notify RN     GLUCOSE, CAPILLARY     Status: Normal   Collection Time   04/04/12  5:20 PM      Component Value Range Comment   Glucose-Capillary 76  70 - 99 mg/dL    Comment 1 Notify RN     GLUCOSE, CAPILLARY     Status: Abnormal   Collection Time   04/04/12  9:23 PM      Component Value Range Comment   Glucose-Capillary 62 (*) 70 - 99 mg/dL    Comment 1 Notify RN      Comment 2 Documented in Chart     GLUCOSE, CAPILLARY     Status: Normal   Collection Time   04/04/12  9:43 PM      Component Value Range Comment   Glucose-Capillary 77  70 - 99 mg/dL    Comment 1 Notify RN      Comment 2 Documented in Chart     GLUCOSE, CAPILLARY     Status: Normal   Collection Time   04/05/12  6:09 AM      Component Value Range Comment   Glucose-Capillary  86  70 - 99 mg/dL   GLUCOSE, CAPILLARY     Status: Normal   Collection Time   04/05/12 12:07 PM      Component Value Range Comment   Glucose-Capillary 89  70 - 99 mg/dL     Physical Findings: AIMS: Facial and Oral Movements Muscles of Facial Expression: None, normal Lips and Perioral Area: None, normal Jaw: None, normal Tongue: None, normal,Extremity Movements Upper (arms, wrists, hands, fingers): None, normal Lower (legs, knees, ankles, toes): None, normal, Trunk Movements Neck, shoulders, hips: None, normal, Overall Severity Severity of abnormal movements (highest score from questions above): None, normal Incapacitation due to abnormal movements: None, normal Patient's awareness of abnormal movements (rate only patient's report): No Awareness, Dental Status Current problems with teeth and/or dentures?: No Does patient usually wear dentures?: No  CIWA:  CIWA-Ar Total: 0  COWS:  COWS Total Score: 0   Treatment Plan Summary: Daily contact with patient to assess and evaluate symptoms and progress in treatment Medication management  Plan:  Patient had a bright affect, reading his Bible in his room, states 5/10 on the depression scale but denies suicidal/homicidal ideations and auditory/visual hallucinations, stated he slept well, appetite poor--refused to go to lunch despite encouraging from multiple staff, stated he was not hungry--staff later saw him exercising in his room, charge nurse went to speak to him about the dangers of hypoglycemia, stated to this clinician that he was not hungry and was trying to get healthy, "I know when my sugar is low and I eat something."  Diabetic medications will be held until he begins eating explained this to the patient and the reasons--he tends to have low blood glucose levels, close monitoring continues, states he is participating in groups during this admission and plans to continue his treatment by keeping his outside appointments and getting a  sponsor to help his sobriety.  Nanine Means, PMH-NP 04/05/2012, 1:00 PM

## 2012-04-06 DIAGNOSIS — F1410 Cocaine abuse, uncomplicated: Secondary | ICD-10-CM

## 2012-04-06 LAB — GLUCOSE, CAPILLARY
Glucose-Capillary: 91 mg/dL (ref 70–99)
Glucose-Capillary: 70 mg/dL (ref 70–99)

## 2012-04-06 MED ORDER — LISINOPRIL 10 MG PO TABS
10.0000 mg | ORAL_TABLET | Freq: Once | ORAL | Status: AC
  Administered 2012-04-06: 10 mg via ORAL
  Filled 2012-04-06: qty 1

## 2012-04-06 MED ORDER — LISINOPRIL 20 MG PO TABS
20.0000 mg | ORAL_TABLET | Freq: Every day | ORAL | Status: DC
  Administered 2012-04-07 – 2012-04-09 (×3): 20 mg via ORAL
  Filled 2012-04-06 (×4): qty 1

## 2012-04-06 MED ORDER — METFORMIN HCL 500 MG PO TABS
1000.0000 mg | ORAL_TABLET | Freq: Every day | ORAL | Status: DC
  Filled 2012-04-06 (×2): qty 2

## 2012-04-06 NOTE — Progress Notes (Signed)
Psychoeducational Group Note  Date:  04/06/2012 Time:  1105  Group Topic/Focus:  Developing a Wellness Toolbox:   The focus of this group is to help patients develop a "wellness toolbox" with skills and strategies to promote recovery upon discharge.  Participation Level:  Active  Participation Quality:  Appropriate, Attentive, Sharing and Supportive  Affect:  Appropriate  Cognitive:  Alert and Appropriate  Insight:  Good  Engagement in Group:  Good  Additional Comments:  Pt participated in group discussion about wellness and balance and how important it is to incorporate it into his daily life.   Dalia Heading 04/06/2012, 7:04 PM

## 2012-04-06 NOTE — Progress Notes (Signed)
Patient did attend the evening speaker AA meeting.  

## 2012-04-06 NOTE — Tx Team (Addendum)
Interdisciplinary Treatment Plan Update (Adult)  Date:  04/06/2012  Time Reviewed:  8:25 PM   Progress in Treatment: Attending groups: Yes Participating in groups:  Yes Taking medication as prescribed: Yes Tolerating medication:  Yes Family/Significant othe contact made:   Patient understands diagnosis:  Yes Discussing patient identified problems/goals with staff:  Yes Medical problems stabilized or resolved:  Yes Denies suicidal/homicidal ideation: Yes Issues/concerns per patient self-inventory:  None identified Other: N/A  New problem(s) identified: None Identified  Reason for Continuation of Hospitalization: Medication stabilization  Interventions implemented related to continuation of hospitalization: mood stabilization, medication monitoring and adjustment, group therapy and psycho education, suicide risk assessment, collateral contact, aftercare planning, ongoing physician assessments and safety checks q 15 mins  Additional comments: N/A  Estimated length of stay: 3-5 days  Discharge Plan: CSW will refer patient to [facility] for outpatient follow up.    New goal(s): N/A  Review of initial/current patient goals per problem list:    1.  Goal(s): Address substance use by completing detox protocol  Met:  yes  Target date: 04/05/12  As evidenced by: completed detox 04/05/12  2.  Goal (s): Reduce depressive symptoms from a 10 to a 3  Met:  yes  Target date: 3-5 days  As evidenced by: Pt rates at a 3  3.  Goal (s): Reduce anxiety symptoms from a 10 to a 3  Met:  yes  Target date:  3-5 days  As evidenced by: Pt rates at a  0  4.  Goal(s):No reports of SI  Met:  No  Target date: 04/07/12  As evidenced [id number] to report no fleeting thoughts of SI   Attendees: Patient:     Family:     Physician: Geoffery Lyons, MD 04/06/2012 8:25 PM   Nursing: Robbie Louis, RN 04/06/2012 8:25 PM   Clinical Social Worker:  [place], LCSW 04/06/2012  8:25 PM   Other:  Alease Frame, RN 04/06/2012  8:25 PM   Other:  Nanine Means, NP   Other:     Other:     Other:      Scribe for Treatment Team:   Reyes Ivan 04/06/2012 8:25 PM

## 2012-04-06 NOTE — Progress Notes (Signed)
[facility] MD Progress Note  04/06/2012 3:08 PM Phillip Turner  MRN:  [id number]  Diagnosis:  Alcohol Dependence, Cocaine Abuse  ADL's:  Intact  Sleep: Fair  Appetite:  Fair  Suicidal Ideation:  Plan:  Denies Intent:  denies Means:  Denies Homicidal Ideation:  Plan:  Denies Intent:  Denies Means:  Denies  Phillip Turner is still having concerns about his BP, DM management. He would like to decrease, go off the Metformin, feels the Glyburide is the one that controls the glucose better. He would like to increase the lisinopril as at home he was controlling the BP with up to 30 mg (not as prescribed) He is working on increasing his network for support. He got a temporary sponsor and he is going to introduce him to other people in the [employer]. He is keeping his wife more in the loop as far as how he is feeling, etc. He still worries that he might relapse as he did last time, for what he has exploring all the possible options to prevent relapse  Mental Status Examination/Evaluation: Objective:  Appearance: Fairly Groomed  Patent attorney::  Fair  Speech:  Clear and Coherent, Slow and not spontaneous  Volume:  Normal  Mood:  worried  Affect:  Restricted  Thought Process:  Coherent and Goal Directed  Orientation:  Full  Thought Content:  worries and concerns  Suicidal Thoughts:  No  Homicidal Thoughts:  No  Memory:  Immediate;   Fair Recent;   Fair Remote;   Fair  Judgement:  Fair  Insight:  Fair  Psychomotor Activity:  Decreased  Concentration:  Fair  Recall:  Fair  Akathisia:  No  Handed:  Right  AIMS (if indicated):     Assets:  Desire for Improvement Housing Social Support  Sleep:  Number of Hours: 5.5    Vital Signs:Blood pressure 160/90, pulse 82, temperature 97.7 F (36.5 C), temperature source Oral, resp. rate 20, height 5\' 9"  (1.753 m), weight 142.429 kg (314 lb). Current Medications: Current Facility-Administered Medications  Medication Dose Route Frequency Provider Last  Rate Last Dose  . acetaminophen (TYLENOL) tablet 650 mg  650 mg Oral Q6H PRN Nanine Means, NP      . alum & mag hydroxide-simeth (MAALOX/MYLANTA) 200-200-20 MG/5ML suspension 30 mL  30 mL Oral Q4H PRN Nanine Means, NP      . aspirin tablet 325 mg  325 mg Oral Daily Nanine Means, NP   325 mg at 04/06/12 0831  . [COMPLETED] cloNIDine (CATAPRES) tablet 0.1 mg  0.1 mg Oral Once Rachael Fee, MD   0.1 mg at 04/05/12 2145  . colchicine tablet 0.6 mg  0.6 mg Oral Daily Nanine Means, NP   0.6 mg at 04/06/12 0830  . febuxostat (ULORIC) tablet 40 mg  40 mg Oral Daily Nanine Means, NP   40 mg at 04/06/12 0829  . FLUoxetine (PROZAC) capsule 40 mg  40 mg Oral Daily Nanine Means, NP   40 mg at 04/06/12 0830  . folic acid (FOLVITE) tablet 1 mg  1 mg Oral Daily Nanine Means, NP   1 mg at 04/06/12 0830  . furosemide (LASIX) tablet 20 mg  20 mg Oral Daily Nanine Means, NP   20 mg at 04/06/12 0829  . glyBURIDE (DIABETA) tablet 2.5 mg  2.5 mg Oral BID WC Nanine Means, NP   2.5 mg at 04/05/12 1849  . levothyroxine (SYNTHROID, LEVOTHROID) tablet 175 mcg  175 mcg Oral QAC breakfast Nanine Means, NP   175  mcg at 04/06/12 0606  . [COMPLETED] lisinopril (PRINIVIL,ZESTRIL) tablet 10 mg  10 mg Oral Once Rachael Fee, MD   10 mg at 04/06/12 1107  . lisinopril (PRINIVIL,ZESTRIL) tablet 20 mg  20 mg Oral Daily Rachael Fee, MD      . magnesium hydroxide (MILK OF MAGNESIA) suspension 30 mL  30 mL Oral Daily PRN Nanine Means, NP      . multivitamin with minerals tablet 1 tablet  1 tablet Oral Daily Nanine Means, NP   1 tablet at 04/06/12 0830  . naproxen (NAPROSYN) tablet 500 mg  500 mg Oral BID WC Nanine Means, NP   500 mg at 04/06/12 0831  . potassium chloride SA (K-DUR,KLOR-CON) CR tablet 20 mEq  20 mEq Oral Daily Nanine Means, NP   20 mEq at 04/06/12 0830  . QUEtiapine (SEROQUEL) tablet 150 mg  150 mg Oral Q2000 Nanine Means, NP   150 mg at 04/05/12 1950  . thiamine (B-1) injection 100 mg  100 mg Intramuscular Once Nanine Means, NP      . thiamine (VITAMIN B-1) tablet 100 mg  100 mg Oral Daily Nanine Means, NP   100 mg at 04/06/12 0830  . [DISCONTINUED] cloNIDine (CATAPRES) tablet 0.1 mg  0.1 mg Oral QAC breakfast Nanine Means, NP      . [DISCONTINUED] lisinopril (PRINIVIL,ZESTRIL) tablet 10 mg  10 mg Oral Daily Nanine Means, NP   10 mg at 04/06/12 0830  . [DISCONTINUED] metFORMIN (GLUCOPHAGE) tablet 1,000 mg  1,000 mg Oral BID WC Nanine Means, NP   1,000 mg at 04/05/12 1850  . [DISCONTINUED] metFORMIN (GLUCOPHAGE) tablet 1,000 mg  1,000 mg Oral Q breakfast Rachael Fee, MD        Lab Results:  Results for orders placed during the hospital encounter of 04/01/12 (from the past 48 hour(s))  GLUCOSE, CAPILLARY     Status: Normal   Collection Time   04/04/12  5:20 PM      Component Value Range Comment   Glucose-Capillary 76  70 - 99 mg/dL    Comment 1 Notify RN     GLUCOSE, CAPILLARY     Status: Abnormal   Collection Time   04/04/12  9:23 PM      Component Value Range Comment   Glucose-Capillary 62 (*) 70 - 99 mg/dL    Comment 1 Notify RN      Comment 2 Documented in Chart     GLUCOSE, CAPILLARY     Status: Normal   Collection Time   04/04/12  9:43 PM      Component Value Range Comment   Glucose-Capillary 77  70 - 99 mg/dL    Comment 1 Notify RN      Comment 2 Documented in Chart     GLUCOSE, CAPILLARY     Status: Normal   Collection Time   04/05/12  6:09 AM      Component Value Range Comment   Glucose-Capillary 86  70 - 99 mg/dL   GLUCOSE, CAPILLARY     Status: Normal   Collection Time   04/05/12 12:07 PM      Component Value Range Comment   Glucose-Capillary 89  70 - 99 mg/dL   GLUCOSE, CAPILLARY     Status: Normal   Collection Time   04/05/12  5:04 PM      Component Value Range Comment   Glucose-Capillary 71  70 - 99 mg/dL    Comment 1 Documented in Chart  Comment 2 Notify RN     GLUCOSE, CAPILLARY     Status: Abnormal   Collection Time   04/05/12  9:15 PM      Component Value Range  Comment   Glucose-Capillary 69 (*) 70 - 99 mg/dL    Comment 1 Notify RN      Comment 2 Documented in Chart     GLUCOSE, CAPILLARY     Status: Normal   Collection Time   04/05/12  9:21 PM      Component Value Range Comment   Glucose-Capillary 80  70 - 99 mg/dL    Comment 1 Notify RN      Comment 2 Documented in Chart     GLUCOSE, CAPILLARY     Status: Normal   Collection Time   04/06/12  6:28 AM      Component Value Range Comment   Glucose-Capillary 89  70 - 99 mg/dL   GLUCOSE, CAPILLARY     Status: Normal   Collection Time   04/06/12 12:12 PM      Component Value Range Comment   Glucose-Capillary 91  70 - 99 mg/dL     Physical Findings: AIMS: Facial and Oral Movements Muscles of Facial Expression: None, normal Lips and Perioral Area: None, normal Jaw: None, normal Tongue: None, normal,Extremity Movements Upper (arms, wrists, hands, fingers): None, normal Lower (legs, knees, ankles, toes): None, normal, Trunk Movements Neck, shoulders, hips: None, normal, Overall Severity Severity of abnormal movements (highest score from questions above): None, normal Incapacitation due to abnormal movements: None, normal Patient's awareness of abnormal movements (rate only patient's report): No Awareness, Dental Status Current problems with teeth and/or dentures?: No Does patient usually wear dentures?: No  CIWA:  CIWA-Ar Total: 1  COWS:  COWS Total Score: 0   Treatment Plan Summary: Daily contact with patient to assess and evaluate symptoms and progress in treatment Medication management  Plan: Supportive approach/coping skills/relapse prevention           Reassess his medications for BP/DM  Phillip Turner A 04/06/2012, 3:08 PM

## 2012-04-06 NOTE — Progress Notes (Addendum)
After Care Planning Group 9:45am Patient actively participated in after care planning group. CSW provided pt with today's workbook. Patient presents with a restricted affect.  Rates his depression at a 3 and anxiety at a 0.  Reports obtaining a sponsor yesterday and continues with plan to follow up with [facility] for outpatient treatment.  Patient plans to continue to build his network up of positive support people.  Patient to discharge home to his wife and has no issues with transportation at this time. Safety planning and suicide prevention discussed. Pt. Participated in discussion and understanding of the information provided.      [facility] Group Notes:  (Counselor/Nursing/MHT/Case Management/Adjunct)  04/06/2012 1:15pm  Type of Therapy:  Group Therapy  Participation Level:  Active  Participation Quality:  Attentive and Sharing  Affect:  Blunted  Cognitive:  Alert  Insight:  Good  Engagement in Group:  Good  Engagement in Therapy:  Good  Modes of Intervention:  Support  Summary of Progress/Problems: The topic of today's group was overcoming obstacles. Patient discussed overcoming obstacles and what that meant for him.  Patient discussed surrendering his control and accepting help from others. Specifically his wife.  Patient further discussed maintaining the plan of obtaining a sponsor and going to NA meetings.  Verna Czech [place] 04/06/2012, 6:55 PM

## 2012-04-06 NOTE — Progress Notes (Addendum)
Pt has been up for most groups today.  He rated his sleep as fair appetite good energy normal and ability to pay attention as improving.  He rated his depression a 6 hopelessness a 3 and denied any anxiety on his self-inventory. He denied any symptoms of withdrawal no thoughts of hurting himself or others.  He plans to go to [place] at discharge and "work the program" He was found to have about 10 packs of cookies in his room and an apple and 2 pears.  Discussed with him about rules of the snacks and if he was to get hungry let staff know that we would give him food.  He voiced understanding.  We also talked about the ant problem that we run into at times and again he voiced understanding.  His metformin was d/c'd today and his lisinopril was raised to 20 mg daily he received an extra 10 mg before lunch.  He denied any S/H ideation or A/V hallucinations.

## 2012-04-07 DIAGNOSIS — E118 Type 2 diabetes mellitus with unspecified complications: Secondary | ICD-10-CM

## 2012-04-07 DIAGNOSIS — F1020 Alcohol dependence, uncomplicated: Secondary | ICD-10-CM

## 2012-04-07 DIAGNOSIS — E1165 Type 2 diabetes mellitus with hyperglycemia: Secondary | ICD-10-CM

## 2012-04-07 DIAGNOSIS — Z8673 Personal history of transient ischemic attack (TIA), and cerebral infarction without residual deficits: Secondary | ICD-10-CM

## 2012-04-07 DIAGNOSIS — I10 Essential (primary) hypertension: Secondary | ICD-10-CM

## 2012-04-07 DIAGNOSIS — F329 Major depressive disorder, single episode, unspecified: Secondary | ICD-10-CM

## 2012-04-07 LAB — GLUCOSE, CAPILLARY
Glucose-Capillary: 80 mg/dL (ref 70–99)
Glucose-Capillary: 72 mg/dL (ref 70–99)

## 2012-04-07 MED ORDER — TRAZODONE HCL 100 MG PO TABS
100.0000 mg | ORAL_TABLET | Freq: Every evening | ORAL | Status: DC | PRN
  Administered 2012-04-07 (×2): 100 mg via ORAL
  Filled 2012-04-07 (×6): qty 1

## 2012-04-07 MED ORDER — AMLODIPINE BESYLATE 10 MG PO TABS
10.0000 mg | ORAL_TABLET | Freq: Every day | ORAL | Status: DC
  Administered 2012-04-07 – 2012-04-09 (×3): 10 mg via ORAL
  Filled 2012-04-07 (×2): qty 1
  Filled 2012-04-07 (×2): qty 2
  Filled 2012-04-07 (×2): qty 1

## 2012-04-07 MED ORDER — TRAZODONE HCL 100 MG PO TABS
100.0000 mg | ORAL_TABLET | Freq: Every evening | ORAL | Status: DC | PRN
  Filled 2012-04-07 (×4): qty 1

## 2012-04-07 NOTE — Progress Notes (Signed)
  D: Pt was talking, laughing and smiling appropriately with his peers. However once writer approaches, pt's behavior darkens. However, pt voiced no questions or concerns.  A: Support and encouragement was offered. 15 min checks continued for safety.  R: Pt remains safe.

## 2012-04-07 NOTE — Progress Notes (Signed)
Psychoeducational Group Note  Date:  04/07/2012 Time:  1100am  Group Topic/Focus:  Recovery Goals:   The focus of this group is to identify appropriate goals for recovery and establish a plan to achieve them.  Participation Level:  Active  Participation Quality:  Appropriate and Attentive  Affect:  Appropriate  Cognitive:  Appropriate  Insight:  Engaged  Engagement in Group:  Engaged  Additional Comments:  Staff explained to the patient that the purpose of this group is to educate them on recovery, and on setting mid-range to long-term personal goals for their recovery process.  Staff also explained that the group will also address topics including what recovery is, who goes through recovery, the first steps toward recovery, and setting realistic goals for recovery. Patient was asked to identify two areas of their life in which they would like to make a change toward recovery, and set a specific measurable goal to address each change identified. Patient was provided with a homework assignment regarding how this goal impacts their personal recovery. Staff concluded the group by encouraging the patient to take a proactive approach to accomplishing their recovery goals.  Ardelle Park O 04/07/2012, 2:57 PM

## 2012-04-07 NOTE — Clinical Social Work Note (Signed)
Aftercare Planning Group: 04/07/2012 9:45 AM  Pt attended discharge planning group and actively participated in group.  CSW provided pt with today's workbook.  Pt presents with calm mood and affect.  Pt rates depression at a 3 and anxiety at a 2 today.  Pt denies SI/HI.  Pt states that he feels positive about his d/c plans and plans to follow through this time.  Pt states that he plans to call his sponsor today.  CSW will secure pt's follow up at [facility] for medication management and therapy.  No further needs voiced by pt at this time.    [facility] Group Note : Clinical Social Worker Group Therapy  04/07/2012  1:15 PM  Type of Therapy:  Group Therapy - Process Group  Participation Level:  Appropriate  Participation Quality:  Appropriate   Affect:  Blunted and Depressed  Cognitive:  Alert  Insight:  Engaged  Engagement in Group:  Engaged  Engagement in Therapy:  Engaged  Modes of Intervention:  Support  Summary of Progress/Problems: Patient was attentive and engaged with speaker from [employer].  Patient expressed interest in their programs and services.  Patient processed ways they can relate to the speaker.     Chelsea Horton, LCSWA 04/07/2012 3:00 pm

## 2012-04-07 NOTE — Progress Notes (Signed)
[facility] Group Notes:  (Counselor/Nursing/MHT/Case Management/Adjunct)  04/07/2012 3:55 PM  Type of Therapy:  Psychoeducational Skills  Participation Level:  Did Not Attend  Summary of Progress/Problems: Phillip Turner did not attend psychoeducation group that focused on using quality time with support systems/individuals to engage in healthy coping skills.    Wandra Scot 04/07/2012, 3:55 PM

## 2012-04-07 NOTE — Progress Notes (Deleted)
Patient ID: Phillip Turner, male   DOB: [birth year], 51 y.o.   MRN: [id number]  D: Pt was talking, laughing and smiling appropriately with his peers. However once writer approaches, pt's behavior darkens. Internal med consult was done and orders were written for increased bp.   A: Support and encouragement was offered. 15 min checks continued for safety.  R: Pt remains safe.

## 2012-04-07 NOTE — Consult Note (Signed)
[facility] Medical Consultation  Phillip Turner ZOX:[id number] DOB: October 08, 1960 DOA: 04/01/2012 PCP: Dorrene German, MD    Date of consultation: 04/07/12 Reason for consultation: hypertension  Impression/Recommendations Principal Problem:  *Substance-induced disorder Active Problems:  Alcohol dependence  Cocaine abuse  Depression  DM (diabetes mellitus), type 2, uncontrolled with complications  HTN (hypertension)    1. Hypertension: suboptimally controlled with lisinopril and lasix. Will add norvasc and monitor.  2. Diabetes Mellitus: CBG (last 3)   Basename 04/07/12 1704 04/07/12 1147 04/07/12 0600  GLUCAP 95 97 80    His CBG's have been well controlled with glyburide, his  HGBA1C is 8. 3. CVA: on full dose aspirin 325 md daily.  4. Gout: no flare up. On Uloric.   I will followup again tomorrow. Please contact me if I can be of assistance in the meanwhile. Thank you for this consultation.  Chief Complaint: alcohol abuse  HPI:  51 year old gentleman with h/o Hypertension, DM, alcohol dependence, cocaine abuse, CVA in the past, Bradycardia was admitted for detox. He was found to have elevated BP' parameters and we were called   Review of Systems:    Past Medical History  Diagnosis Date  . Diabetes mellitus   . Hypertension   . Hypothyroidism   . Anxiety   . Depression   . Stroke     does not remember when   Past Surgical History  Procedure Date  . Tonsillectomy    Social History:  reports that he has quit smoking. His smoking use included Cigarettes. He quit after 5 years of use. He has never used smokeless tobacco. He reports that he drinks about 10.8 ounces of alcohol per week. He reports that he uses illicit drugs ("Crack" cocaine).  Allergies  Allergen Reactions  . Shellfish Allergy Anaphylaxis and Nausea And Vomiting   Family History  Problem Relation Age of Onset  . Coronary artery disease Mother   . Diabetes type II Father   .  Hypertension Father     Prior to Admission medications   Medication Sig Start Date End Date Taking? Authorizing Provider  albuterol (PROVENTIL) (2.5 MG/3ML) 0.083% nebulizer solution Take 3 mLs (2.5 mg total) by nebulization every 6 (six) hours as needed for wheezing. 12/11/11 12/10/12 Yes Loren Racer, MD  aspirin 325 MG tablet Take 1 tablet (325 mg total) by mouth daily. 03/17/12  Yes Nanine Means, NP  colchicine 0.6 MG tablet Take 1 tablet (0.6 mg total) by mouth daily. Gout Flare 03/09/12  Yes Cristal Ford, MD  febuxostat (ULORIC) 40 MG tablet Take 1 tablet (40 mg total) by mouth daily. 03/17/12  Yes Nanine Means, NP  FLUoxetine (PROZAC) 40 MG capsule Take 1 capsule (40 mg total) by mouth daily. 03/17/12  Yes Nanine Means, NP  folic acid (FOLVITE) 1 MG tablet Take 1 tablet (1 mg total) by mouth daily. 03/17/12  Yes Nanine Means, NP  furosemide (LASIX) 20 MG tablet Take 40 mg by mouth daily. 03/17/12  Yes Nanine Means, NP  glyBURIDE (DIABETA) 2.5 MG tablet Take 1 tablet (2.5 mg total) by mouth 2 (two) times daily with a meal. 03/17/12  Yes Nanine Means, NP  levothyroxine (SYNTHROID, LEVOTHROID) 175 MCG tablet Take 1 tablet (175 mcg total) by mouth daily before breakfast. 03/17/12  Yes Nanine Means, NP  lisinopril (PRINIVIL,ZESTRIL) 10 MG tablet Take 20 mg by mouth daily. 03/17/12  Yes Nanine Means, NP  metFORMIN (GLUCOPHAGE) 500 MG tablet Take 2 tablets (1,000 mg total) by mouth daily  with breakfast. 03/17/12  Yes Nanine Means, NP  naproxen (NAPROSYN) 500 MG tablet Take 1 tablet (500 mg total) by mouth 2 (two) times daily with a meal. 03/17/12  Yes Nanine Means, NP  potassium chloride SA (K-DUR,KLOR-CON) 20 MEQ tablet Take 40 mEq by mouth 4 (four) times daily. 03/17/12  Yes Nanine Means, NP  QUEtiapine (SEROQUEL) 50 MG tablet Take 3 tablets (150 mg total) by mouth daily at 8 pm. 03/17/12  Yes Nanine Means, NP  simvastatin (ZOCOR) 20 MG tablet Take 1 tablet (20 mg total) by mouth daily. 03/17/12   Yes Nanine Means, NP   Physical Exam: Blood pressure 169/95, pulse 54, temperature 97.4 F (36.3 C), temperature source Oral, resp. rate 24, height 5\' 9"  (1.753 m), weight 142.429 kg (314 lb). Filed Vitals:   04/07/12 0601 04/07/12 0658 04/07/12 1148 04/07/12 1151  BP: 167/102 110/101 164/90 169/95  Pulse: 60  55 54  Temp:      TempSrc:      Resp:      Height:      Weight:        Constitutional: Vital signs reviewed.  Patient is a well-developed and well-nourished  in no acute distress and cooperative with exam. Alert and oriented x3.  Head: Normocephalic and atraumatic Ear: TM normal bilaterally Mouth: no erythema or exudates, MMM Eyes: PERRL, EOMI, conjunctivae normal, No scleral icterus.  Neck: Supple, Trachea midline normal ROM, No JVD, mass, thyromegaly, or carotid bruit present.  Cardiovascular: RRR, S1 normal, S2 normal, no MRG, pulses symmetric and intact bilaterally Pulmonary/Chest: CTAB, no wheezes, rales, or rhonchi Abdominal: Soft. Non-tender, non-distended, bowel sounds are normal, no masses, organomegaly, or guarding present.  GU: no CVA tenderness Musculoskeletal: No joint deformities, erythema, or stiffness, ROM full and no nontender, ankle edema present.  Hematology: no cervical, inginal, or axillary adenopathy.  Neurological: A&O x3, Strength is normal and symmetric bilaterally, cranial nerve II-XII are grossly intact, no focal motor deficit, sensory intact to light touch bilaterally.  Skin: Warm, dry and intact. No rash, cyanosis, or clubbing.  Psychiatric: Normal mood and affect. speech and behavior is normal. Judgment and thought content normal. Cognition and memory are normal.     Labs on Admission:  Basic Metabolic Panel:  Lab 04/02/12 5621  NA 144  K 3.7  CL 105  CO2 17*  GLUCOSE 65*  BUN 11  CREATININE 1.54*  CALCIUM 9.5  MG --  PHOS --   Liver Function Tests: No results found for this basename:  AST:5,ALT:5,ALKPHOS:5,BILITOT:5,PROT:5,ALBUMIN:5 in the last 168 hours No results found for this basename: LIPASE:5,AMYLASE:5 in the last 168 hours No results found for this basename: AMMONIA:5 in the last 168 hours CBC: No results found for this basename: WBC:5,NEUTROABS:5,HGB:5,HCT:5,MCV:5,PLT:5 in the last 168 hours Cardiac Enzymes: No results found for this basename: CKTOTAL:5,CKMB:5,CKMBINDEX:5,TROPONINI:5 in the last 168 hours BNP: No components found with this basename: POCBNP:5 CBG:  Lab 04/07/12 1147 04/07/12 0600 04/06/12 2110 04/06/12 1713 04/06/12 1212  GLUCAP 97 80 70 80 91    Radiological Exams on Admission: No results found.  EKG: nSR on 11/16    [facility] Pager [phone]  If 7PM-7AM, please contact night-coverage [email or web address] Password [facility] 04/07/2012, 4:46 PM

## 2012-04-07 NOTE — Progress Notes (Signed)
[facility] MD Progress Note  04/07/2012 11:25 AM Phillip Turner  MRN:  [id number]  Diagnosis:  Major depression, Alcohol Dependence, Cocaine Abuse  ADL's:  Intact  Sleep: Fair  Appetite:  Fair  Suicidal Ideation:  Plan:  Denies Intent:  Denies Means:  Denies Homicidal Ideation:  Plan:  Denies Intent:  Denies Means:  Denies  Phillip Turner is having a hard time worrying about his BP, side effects of his medications. He is still working on building up a support system by means of having a sponsor, identifying other people he can count on and having his wife as a Theatre stage manager. He is committed to abstain from drinking and using crack. Says this time around he is feeling better about his ability to abstain  Mental Status Examination/Evaluation: Objective:  Appearance: Fairly Groomed  Patent attorney::  Fair  Speech:  Clear and Coherent and Slow  Volume:  Decreased  Mood:  worried  Affect:  worried  Thought Process:  Coherent and Goal Directed  Orientation:  Full  Thought Content:  concerns about his medications, BP  Suicidal Thoughts:  No  Homicidal Thoughts:  No  Memory:  Immediate;   Fair Recent;   Fair Remote;   Fair  Judgement:  Intact  Insight:  Present  Psychomotor Activity:  Decreased  Concentration:  Fair  Recall:  Fair  Akathisia:  No  Handed:  Right  AIMS (if indicated):     Assets:  Communication Skills Desire for Improvement Social Support  Sleep:  Number of Hours: 6    Vital Signs:Blood pressure 110/101, pulse 60, temperature 97.4 F (36.3 C), temperature source Oral, resp. rate 24, height 5\' 9"  (1.753 m), weight 142.429 kg (314 lb). Current Medications: Current Facility-Administered Medications  Medication Dose Route Frequency Provider Last Rate Last Dose  . acetaminophen (TYLENOL) tablet 650 mg  650 mg Oral Q6H PRN Nanine Means, NP      . alum & mag hydroxide-simeth (MAALOX/MYLANTA) 200-200-20 MG/5ML suspension 30 mL  30 mL Oral Q4H PRN Nanine Means, NP      . aspirin  tablet 325 mg  325 mg Oral Daily Nanine Means, NP   325 mg at 04/07/12 0758  . colchicine tablet 0.6 mg  0.6 mg Oral Daily Nanine Means, NP   0.6 mg at 04/07/12 0759  . febuxostat (ULORIC) tablet 40 mg  40 mg Oral Daily Nanine Means, NP   40 mg at 04/07/12 0758  . FLUoxetine (PROZAC) capsule 40 mg  40 mg Oral Daily Nanine Means, NP   40 mg at 04/07/12 0759  . folic acid (FOLVITE) tablet 1 mg  1 mg Oral Daily Nanine Means, NP   1 mg at 04/07/12 0759  . furosemide (LASIX) tablet 20 mg  20 mg Oral Daily Nanine Means, NP   20 mg at 04/07/12 0758  . glyBURIDE (DIABETA) tablet 2.5 mg  2.5 mg Oral BID WC Nanine Means, NP   2.5 mg at 04/07/12 0758  . levothyroxine (SYNTHROID, LEVOTHROID) tablet 175 mcg  175 mcg Oral QAC breakfast Nanine Means, NP   175 mcg at 04/07/12 [phone]  . lisinopril (PRINIVIL,ZESTRIL) tablet 20 mg  20 mg Oral Daily Rachael Fee, MD   20 mg at 04/07/12 0701  . magnesium hydroxide (MILK OF MAGNESIA) suspension 30 mL  30 mL Oral Daily PRN Nanine Means, NP      . multivitamin with minerals tablet 1 tablet  1 tablet Oral Daily Nanine Means, NP   1 tablet at 04/07/12 0759  .  naproxen (NAPROSYN) tablet 500 mg  500 mg Oral BID WC Nanine Means, NP   500 mg at 04/07/12 0759  . potassium chloride SA (K-DUR,KLOR-CON) CR tablet 20 mEq  20 mEq Oral Daily Nanine Means, NP   20 mEq at 04/07/12 0759  . thiamine (B-1) injection 100 mg  100 mg Intramuscular Once Nanine Means, NP      . thiamine (VITAMIN B-1) tablet 100 mg  100 mg Oral Daily Nanine Means, NP   100 mg at 04/07/12 0759  . traZODone (DESYREL) tablet 100 mg  100 mg Oral QHS,MR X 1 Rachael Fee, MD      . [DISCONTINUED] metFORMIN (GLUCOPHAGE) tablet 1,000 mg  1,000 mg Oral Q breakfast Rachael Fee, MD      . [DISCONTINUED] QUEtiapine (SEROQUEL) tablet 150 mg  150 mg Oral Q2000 Nanine Means, NP   150 mg at 04/06/12 2006    Lab Results:  Results for orders placed during the hospital encounter of 04/01/12 (from the past 48 hour(s))   GLUCOSE, CAPILLARY     Status: Normal   Collection Time   04/05/12 12:07 PM      Component Value Range Comment   Glucose-Capillary 89  70 - 99 mg/dL   GLUCOSE, CAPILLARY     Status: Normal   Collection Time   04/05/12  5:04 PM      Component Value Range Comment   Glucose-Capillary 71  70 - 99 mg/dL    Comment 1 Documented in Chart      Comment 2 Notify RN     GLUCOSE, CAPILLARY     Status: Abnormal   Collection Time   04/05/12  9:15 PM      Component Value Range Comment   Glucose-Capillary 69 (*) 70 - 99 mg/dL    Comment 1 Notify RN      Comment 2 Documented in Chart     GLUCOSE, CAPILLARY     Status: Normal   Collection Time   04/05/12  9:21 PM      Component Value Range Comment   Glucose-Capillary 80  70 - 99 mg/dL    Comment 1 Notify RN      Comment 2 Documented in Chart     GLUCOSE, CAPILLARY     Status: Normal   Collection Time   04/06/12  6:28 AM      Component Value Range Comment   Glucose-Capillary 89  70 - 99 mg/dL   GLUCOSE, CAPILLARY     Status: Normal   Collection Time   04/06/12 12:12 PM      Component Value Range Comment   Glucose-Capillary 91  70 - 99 mg/dL   GLUCOSE, CAPILLARY     Status: Normal   Collection Time   04/06/12  5:13 PM      Component Value Range Comment   Glucose-Capillary 80  70 - 99 mg/dL   GLUCOSE, CAPILLARY     Status: Normal   Collection Time   04/06/12  9:10 PM      Component Value Range Comment   Glucose-Capillary 70  70 - 99 mg/dL    Comment 1 Notify RN     GLUCOSE, CAPILLARY     Status: Normal   Collection Time   04/07/12  6:00 AM      Component Value Range Comment   Glucose-Capillary 80  70 - 99 mg/dL     Physical Findings: AIMS: Facial and Oral Movements Muscles of Facial Expression: None, normal Lips  and Perioral Area: None, normal Jaw: None, normal Tongue: None, normal,Extremity Movements Upper (arms, wrists, hands, fingers): None, normal Lower (legs, knees, ankles, toes): None, normal, Trunk Movements Neck, shoulders,  hips: None, normal, Overall Severity Severity of abnormal movements (highest score from questions above): None, normal Incapacitation due to abnormal movements: None, normal Patient's awareness of abnormal movements (rate only patient's report): No Awareness, Dental Status Current problems with teeth and/or dentures?: No Does patient usually wear dentures?: No  CIWA:  CIWA-Ar Total: 1  COWS:  COWS Total Score: 0   Treatment Plan Summary: Daily contact with patient to assess and evaluate symptoms and progress in treatment Medication management  Plan: Supportive approach/coping skills           Will D/C the Seroquel due to possibility that is causing the ankle swelling           Trazodone 100 mg HS PRN sleep repeat X 1           Will consult Internal Medicine to help manage his BP Ansar Skoda A 04/07/2012, 11:25 AM

## 2012-04-07 NOTE — Progress Notes (Signed)
Pt reported his sleep as fair appetite good energy level normal and ability to pay attention is improving.  He rated his depression a 2 denied feeling hopeless and denied any anxiety.  He denies any symptoms of withdrawal denies any S/H ideation he plans go to [employer] for f/u and getting a sponsor a getting back into [employer]

## 2012-04-08 DIAGNOSIS — E039 Hypothyroidism, unspecified: Secondary | ICD-10-CM

## 2012-04-08 DIAGNOSIS — F332 Major depressive disorder, recurrent severe without psychotic features: Secondary | ICD-10-CM

## 2012-04-08 LAB — GLUCOSE, CAPILLARY

## 2012-04-08 MED ORDER — LEVOTHYROXINE SODIUM 75 MCG PO TABS
75.0000 ug | ORAL_TABLET | Freq: Once | ORAL | Status: AC
  Administered 2012-04-08: 75 ug via ORAL
  Filled 2012-04-08 (×2): qty 1

## 2012-04-08 MED ORDER — TRAZODONE HCL 100 MG PO TABS
200.0000 mg | ORAL_TABLET | Freq: Every day | ORAL | Status: DC
  Administered 2012-04-08: 200 mg via ORAL
  Filled 2012-04-08 (×2): qty 2

## 2012-04-08 NOTE — Progress Notes (Signed)
Patient ID: Phillip Turner, male   DOB: [birth year], 51 y.o.   MRN: [id number]  D:  Pt was smiling and admitted to having a "great day". Pt stated he was excited that he has 2 sponsors with AA, and is anxious and excited to get started with his treatment. Stated that the last time he was here he didn't take treatment seriously. States that he has all the literature and plans to take it home and make "a game" out of learning about his illness.  A: Encouragement and support offered. 15 min checks continued for safety.  R:Pt remains safe.

## 2012-04-08 NOTE — Progress Notes (Signed)
[facility] Group Notes:  (Counselor/Nursing/MHT/Case Management/Adjunct)  04/08/2012 3:03 PM  Type of Therapy:  Group Therapy  Participation Level:  Active  Participation Quality:  Appropriate, Sharing and Supportive  Affect:  Blunted  Cognitive:  Appropriate  Insight:  Engaged  Engagement in Group:  Engaged  Engagement in Therapy:  Engaged  Modes of Intervention:  Support  Summary of Progress/Problems: The topic for group today was emotional regulation.  Pt participated in the discussion regarding what emotional regulation is and how it affects their life, positive and negative.  Pt discussed coping skills and ways they can regulate their emotions in a positive manner. Patient discussed staying in the moment and not allowing himself to get overwhelmed by thinking too far into the future.  Patient remains committed to obtaining a sponsor and allowing himself to receive support from families and friends    Aris Georgia 04/08/2012, 3:03 PM

## 2012-04-08 NOTE — Progress Notes (Signed)
Consulted for diabetes mellitus and hypertension. Blood pressure and blood sugars reviewed. Blood sugars remain very well controlled on glyburide. Patient's pressures this morning prior medications were in the 150s. Norvasc started yesterday. Continue to follow blood pressure. Continue aspirin and Uloric for history of CVA and gout respectively. Stable. Continue to follow.

## 2012-04-08 NOTE — Clinical Social Work Note (Signed)
[facility] LCSW Aftercare Discharge Planning Group Note  04/08/2012 10:26 AM  Participation Quality:  Appropriate and Attentive  Affect:  Appropriate, Calm  Cognitive:  Alert and Appropriate  Insight:  Engaged  Engagement in Group:  Engaged  Modes of Intervention:  Clarification, Discussion, Education, Exploration, Rapport Building, Socialization and Support  Summary of Progress/Problems: Pt attended discharge planning group and actively participated in group.  CSW provided pt with today's workbook.  Pt presents with calm mood and affect.  Pt rates depression at a 0 and anxiety at a 1 today.  Pt denies SI/HI.  Pt reports feeling positive about his d/c plans.  Pt has follow up scheduled at [facility] for medication management and therapy.  No further needs voiced by pt at this time.    Carmina Miller 04/08/2012, 10:26 AM

## 2012-04-08 NOTE — Progress Notes (Signed)
Did not attend 11 am group

## 2012-04-08 NOTE — Progress Notes (Signed)
[facility] MD Progress Note  04/08/2012 11:24 AM Phillip Turner  MRN:  [id number]  Diagnosis:   Axis I: Alcohol Abuse, Major Depression, Recurrent severe, Substance Abuse and Substance Induced Mood Disorder Axis II: Deferred Axis III:  Past Medical History  Diagnosis Date  . Diabetes mellitus   . Hypertension   . Hypothyroidism   . Anxiety   . Depression   . Stroke     does not remember when   Axis IV: other psychosocial or environmental problems, problems related to social environment and problems with primary support group Axis V: 61-70 mild symptoms  ADL's:  Intact  Sleep: Good  Appetite:  Good  Suicidal Ideation:  Denies Homicidal Ideation:  Denies  Mental Status Examination/Evaluation: Objective:  Appearance: Casual  Eye Contact::  Good  Speech:  Normal Rate  Volume:  Normal  Mood:  Euthymic  Affect:  Appropriate  Thought Process:  Logical  Orientation:  Full  Thought Content:  WDL  Suicidal Thoughts:  No  Homicidal Thoughts:  No  Memory:  Immediate;   Fair Recent;   Fair Remote;   Fair  Judgement:  Fair  Insight:  Fair  Psychomotor Activity:  Normal  Concentration:  Fair  Recall:  Fair  Akathisia:  No  Handed:  Right  AIMS (if indicated):     Assets:  Communication Skills Desire for Improvement Social Support  Sleep:  Number of Hours: 6.75    Vital Signs:Blood pressure 145/87, pulse 75, temperature 97.9 F (36.6 C), temperature source Oral, resp. rate 16, height 5\' 9"  (1.753 m), weight 142.429 kg (314 lb). Current Medications: Current Facility-Administered Medications  Medication Dose Route Frequency Provider Last Rate Last Dose  . acetaminophen (TYLENOL) tablet 650 mg  650 mg Oral Q6H PRN Nanine Means, NP      . alum & mag hydroxide-simeth (MAALOX/MYLANTA) 200-200-20 MG/5ML suspension 30 mL  30 mL Oral Q4H PRN Nanine Means, NP      . amLODipine (NORVASC) tablet 10 mg  10 mg Oral Daily Kathlen Mody, MD   10 mg at 04/08/12 8657  . aspirin tablet 325  mg  325 mg Oral Daily Nanine Means, NP   325 mg at 04/08/12 8469  . colchicine tablet 0.6 mg  0.6 mg Oral Daily Nanine Means, NP   0.6 mg at 04/08/12 0823  . febuxostat (ULORIC) tablet 40 mg  40 mg Oral Daily Nanine Means, NP   40 mg at 04/08/12 0819  . FLUoxetine (PROZAC) capsule 40 mg  40 mg Oral Daily Nanine Means, NP   40 mg at 04/08/12 6295  . folic acid (FOLVITE) tablet 1 mg  1 mg Oral Daily Nanine Means, NP   1 mg at 04/08/12 0820  . furosemide (LASIX) tablet 20 mg  20 mg Oral Daily Nanine Means, NP   20 mg at 04/08/12 0820  . glyBURIDE (DIABETA) tablet 2.5 mg  2.5 mg Oral BID WC Nanine Means, NP   2.5 mg at 04/08/12 0819  . levothyroxine (SYNTHROID, LEVOTHROID) tablet 175 mcg  175 mcg Oral QAC breakfast Nanine Means, NP   100 mcg at 04/08/12 0618  . [COMPLETED] levothyroxine (SYNTHROID, LEVOTHROID) tablet 75 mcg  75 mcg Oral Once Rachael Fee, MD   75 mcg at 04/08/12 [phone]  . lisinopril (PRINIVIL,ZESTRIL) tablet 20 mg  20 mg Oral Daily Rachael Fee, MD   20 mg at 04/08/12 3244  . magnesium hydroxide (MILK OF MAGNESIA) suspension 30 mL  30 mL Oral Daily PRN Catha Nottingham  Lord, NP      . multivitamin with minerals tablet 1 tablet  1 tablet Oral Daily Nanine Means, NP   1 tablet at 04/08/12 0820  . naproxen (NAPROSYN) tablet 500 mg  500 mg Oral BID WC Nanine Means, NP   500 mg at 04/08/12 0819  . potassium chloride SA (K-DUR,KLOR-CON) CR tablet 20 mEq  20 mEq Oral Daily Nanine Means, NP   20 mEq at 04/08/12 1610  . thiamine (B-1) injection 100 mg  100 mg Intramuscular Once Nanine Means, NP      . thiamine (VITAMIN B-1) tablet 100 mg  100 mg Oral Daily Nanine Means, NP   100 mg at 04/08/12 9604  . traZODone (DESYREL) tablet 100 mg  100 mg Oral QHS,MR X 1 Rachael Fee, MD   100 mg at 04/07/12 2148  . [DISCONTINUED] traZODone (DESYREL) tablet 100 mg  100 mg Oral QHS,MR X 1 Rachael Fee, MD        Lab Results:  Results for orders placed during the hospital encounter of 04/01/12 (from the past 48  hour(s))  GLUCOSE, CAPILLARY     Status: Normal   Collection Time   04/06/12 12:12 PM      Component Value Range Comment   Glucose-Capillary 91  70 - 99 mg/dL   GLUCOSE, CAPILLARY     Status: Normal   Collection Time   04/06/12  5:13 PM      Component Value Range Comment   Glucose-Capillary 80  70 - 99 mg/dL   GLUCOSE, CAPILLARY     Status: Normal   Collection Time   04/06/12  9:10 PM      Component Value Range Comment   Glucose-Capillary 70  70 - 99 mg/dL    Comment 1 Notify RN     GLUCOSE, CAPILLARY     Status: Normal   Collection Time   04/07/12  6:00 AM      Component Value Range Comment   Glucose-Capillary 80  70 - 99 mg/dL   GLUCOSE, CAPILLARY     Status: Normal   Collection Time   04/07/12 11:47 AM      Component Value Range Comment   Glucose-Capillary 97  70 - 99 mg/dL   GLUCOSE, CAPILLARY     Status: Normal   Collection Time   04/07/12  5:04 PM      Component Value Range Comment   Glucose-Capillary 95  70 - 99 mg/dL   GLUCOSE, CAPILLARY     Status: Normal   Collection Time   04/07/12  9:29 PM      Component Value Range Comment   Glucose-Capillary 72  70 - 99 mg/dL    Comment 1 Notify RN     GLUCOSE, CAPILLARY     Status: Normal   Collection Time   04/08/12  6:07 AM      Component Value Range Comment   Glucose-Capillary 86  70 - 99 mg/dL     Physical Findings: AIMS: Facial and Oral Movements Muscles of Facial Expression: None, normal Lips and Perioral Area: None, normal Jaw: None, normal Tongue: None, normal,Extremity Movements Upper (arms, wrists, hands, fingers): None, normal Lower (legs, knees, ankles, toes): None, normal, Trunk Movements Neck, shoulders, hips: None, normal, Overall Severity Severity of abnormal movements (highest score from questions above): None, normal Incapacitation due to abnormal movements: None, normal Patient's awareness of abnormal movements (rate only patient's report): No Awareness, Dental Status Current problems with teeth  and/or dentures?: No  Does patient usually wear dentures?: No  CIWA:  CIWA-Ar Total: 1  COWS:  COWS Total Score: 0   Treatment Plan Summary: Daily contact with patient to assess and evaluate symptoms and progress in treatment Medication management  Plan:  Patient has a bright affect and denies depression, sleep and appetite good, denies suicidal/homicidal ideations and auditory/visual hallucinations, patient is participating in groups and taking notes, he has obtained a sponsor and plans to go to AA groups, family is a good support system, his wife is going to take over the finances, positive outlook, Lindsey wants to continue the program and has develop appropriate coping skills.  Nanine Means, PMH-NP 04/08/2012, 11:24 AM

## 2012-04-08 NOTE — Progress Notes (Signed)
D: Patient appropriate and cooperative with staff and peers. Attending groups. Patient reported on self inventory sheet that his energy level is normal and ability to pay attention is good. He rated depression and feelings of hopelessness "0".  A: Support and encouragement provided to patient. Scheduled medications administered per MD orders. Monitor 15 minute observation for safety.  R: Patient receptive. Denies SI/HI. Patient remains safe.

## 2012-04-09 LAB — GLUCOSE, CAPILLARY: Glucose-Capillary: 106 mg/dL — ABNORMAL HIGH (ref 70–99)

## 2012-04-09 MED ORDER — ALBUTEROL SULFATE (2.5 MG/3ML) 0.083% IN NEBU: 2.5000 mg | INHALATION_SOLUTION | Freq: Four times a day (QID) | RESPIRATORY_TRACT | Status: DC | PRN

## 2012-04-09 MED ORDER — FLUOXETINE HCL 40 MG PO CAPS: 40.0000 mg | ORAL_CAPSULE | Freq: Every day | ORAL | Status: DC

## 2012-04-09 MED ORDER — TRAZODONE HCL 100 MG PO TABS: 200.0000 mg | ORAL_TABLET | Freq: Every day | ORAL | Status: DC

## 2012-04-09 MED ORDER — FOLIC ACID 1 MG PO TABS: 1.0000 mg | ORAL_TABLET | Freq: Every day | ORAL | Status: DC

## 2012-04-09 MED ORDER — FUROSEMIDE 20 MG PO TABS: 20.0000 mg | ORAL_TABLET | Freq: Every day | ORAL | Status: DC

## 2012-04-09 MED ORDER — ASPIRIN 325 MG PO TABS: 325.0000 mg | ORAL_TABLET | Freq: Every day | ORAL | Status: DC

## 2012-04-09 MED ORDER — POTASSIUM CHLORIDE CRYS ER 20 MEQ PO TBCR: 40.0000 meq | EXTENDED_RELEASE_TABLET | Freq: Four times a day (QID) | ORAL | Status: DC

## 2012-04-09 MED ORDER — AMLODIPINE BESYLATE 10 MG PO TABS: 10.0000 mg | ORAL_TABLET | Freq: Every day | ORAL | Status: DC

## 2012-04-09 MED ORDER — GLYBURIDE 2.5 MG PO TABS: 2.5000 mg | ORAL_TABLET | Freq: Two times a day (BID) | ORAL | Status: DC

## 2012-04-09 MED ORDER — LEVOTHYROXINE SODIUM 175 MCG PO TABS: 175.0000 ug | ORAL_TABLET | Freq: Every day | ORAL | Status: DC

## 2012-04-09 MED ORDER — NAPROXEN 500 MG PO TABS: 500.0000 mg | ORAL_TABLET | Freq: Two times a day (BID) | ORAL | Status: DC

## 2012-04-09 MED ORDER — COLCHICINE 0.6 MG PO TABS: 0.6000 mg | ORAL_TABLET | Freq: Every day | ORAL | Status: DC

## 2012-04-09 MED ORDER — FEBUXOSTAT 40 MG PO TABS: 40.0000 mg | ORAL_TABLET | Freq: Every day | ORAL | Status: DC

## 2012-04-09 MED ORDER — LISINOPRIL 20 MG PO TABS: 20.0000 mg | ORAL_TABLET | Freq: Every day | ORAL | Status: DC

## 2012-04-09 NOTE — Progress Notes (Signed)
D: Patient affect brighter today, as opposed to previous shift; his mood was appropriate to the circumstance. He reported on self inventory sheet that his appetite is good, energy level is normal, and ability to pay attention is good. Patient rated depression "0" and feelings of hopelessness "1".  A: Support and encouragement provided. Administered scheduled medications per MD orders. Discharge instructions/prescriptions/medication samples given to patient. Returned belongings to patient.  R: Patient receptive. Patient verbalized understanding of discharge instructions and prescriptions. Denies SI/HI. Patient d/c without incident.

## 2012-04-09 NOTE — Progress Notes (Signed)
[facility] INPATIENT:  Family/Significant Other Suicide Prevention Education  Suicide Prevention Education:  Education Completed; Zella Ball Eppard-[phone],  (name of family member/significant other) has been identified by the patient as the family member/significant other with whom the patient will be residing, and identified as the person(s) who will aid the patient in the event of a mental health crisis (suicidal ideations/suicide attempt).  With written consent from the patient, the family member/significant other has been provided the following suicide prevention education, prior to the and/or following the discharge of the patient.  The suicide prevention education provided includes the following:  Suicide risk factors  Suicide prevention and interventions  National Suicide Hotline telephone number  [facility] assessment telephone number  [facility] Emergency Assistance 911  [facility] and/or Residential Mobile Crisis Unit telephone number  Request made of family/significant other to:  Remove weapons (e.g., guns, rifles, knives), all items previously/currently identified as safety concern.    Remove drugs/medications (over-the-counter, prescriptions, illicit drugs), all items previously/currently identified as a safety concern.  The family member/significant other verbalizes understanding of the suicide prevention education information provided.  The family member/significant other agrees to remove the items of safety concern listed above.  Makelle Marrone, [place] 04/09/2012, 11:00 AM

## 2012-04-09 NOTE — Progress Notes (Signed)
[facility] Adult Case Management Discharge Plan :  Will you be returning to the same living situation after discharge: Yes,    At discharge, do you have transportation home?:Yes,    Do you have the ability to pay for your medications:Yes,     Release of information consent forms completed and in the chart;  Patient's signature needed at discharge.  Patient to Follow up at: Follow-up Information    Follow up with [facility]. On 04/10/2012. (Walk-in appointment scheduled for Friday, 12./6/13 at 8:00am)    Contact information:   [address], [place] [address] [phone] Fax [phone]         Patient denies SI/HI:   Yes,       Safety Planning and Suicide Prevention discussed:  Nino Glow 04/09/2012, 10:35 AM

## 2012-04-09 NOTE — Clinical Social Work Note (Signed)
[facility] LCSW Aftercare Discharge Planning Group Note  04/09/2012 8:45 AM   Participation Quality: Appropriate, Attentive, Sharing and Supportive   Affect: Appropriate   Cognitive: Alert and Appropriate   Insight: Engaged   Engagement in Group: Engaged   Modes of Intervention: Clarification, Discussion, Education, Exploration, Orientation, Problem-solving, Rapport Building, Socialization and Support   Summary of Progress/Problems: Pt attended discharge planning group and actively participated in group. CSW provided pt with today's workbook. Pt presents with bright affect and mood.  Pt rates depression and anxiety at a 0 today.  Pt denies SI/HI.  Pt is hopeful he can go home today.  Pt states that he feels ready to d/c and plans to follow through this time.  Pt will [facility] up at [facility] for medication management and therapy.  No further needs voiced by pt at this time.    Phillip Turner, LCSWA  04/09/2012 9:14 AM

## 2012-04-09 NOTE — [facility] Suicide Risk Assessment (Signed)
Suicide Risk Assessment  Discharge Assessment     Demographic Factors:  Male  Mental Status Per Nursing Assessment::   On Admission:  NA  Current Mental Status by Physician: In full contact with reality. There are no suicidal ideas, plans or intent. His mood is euthymic. He is in a whole different space than he was when he was here before. He has developed a better support system, he is committed to abstinence.   Loss Factors: Decline in physical health  Historical Factors: NA  Risk Reduction Factors:   Responsible for children under 73 years of age, Sense of responsibility to family, Living with another person, especially a relative and Positive social support  Continued Clinical Symptoms:  Depression:   Comorbid alcohol abuse/dependence Alcohol/Substance Abuse/Dependencies  Cognitive Features That Contribute To Risk: No evidence   Suicide Risk:  Minimal: No identifiable suicidal ideation.  Patients presenting with no risk factors but with morbid ruminations; may be classified as minimal risk based on the severity of the depressive symptoms  Discharge Diagnoses:   AXIS I:  Major Depressions, Alcohol Dependence, Cocaine abuse AXIS II:  Deferred AXIS III:   Past Medical History  Diagnosis Date  . Diabetes mellitus   . Hypertension   . Hypothyroidism   . Anxiety   . Depression   . Stroke     does not remember when   AXIS IV:  None identified AXIS V:  61-70 mild symptoms  Plan Of Care/Follow-up recommendations:  Activity:  Increase physical activity as tolerated Diet:  Diabetic/low salt Will follow up with [facility], and go to AA/NA Is patient on multiple antipsychotic therapies at discharge:  No   Has Patient had three or more failed trials of antipsychotic monotherapy by history:  No  Recommended Plan for Multiple Antipsychotic Therapies: N/A   Jalesha Plotz A 04/09/2012, 11:24 AM

## 2012-04-10 NOTE — Progress Notes (Signed)
Agree with assesment and plan  Phillip Turner A. Iliany Losier, M.D. 

## 2012-04-10 NOTE — Progress Notes (Signed)
Agree with assessment and plan Phillip Turner Phillip Turner, M.D. 

## 2012-04-10 NOTE — Progress Notes (Signed)
[facility] LCSW Group Therapy  04/10/2012 5:16 PM  Type of Therapy:  Group Therapy  Participation Level:  Active  Participation Quality:  Attentive, Sharing and Supportive  Affect:  Appropriate  Cognitive:  Oriented  Insight:  Engaged  Engagement in Therapy:  Engaged  Modes of Intervention:  Discussion, Exploration, Problem-solving and Support  Summary of Progress/Problems: The topic for group was balance in life.  Pt participated in the discussion about when their life was in balance and out of balance and how this feels.  Pt discussed ways to get back in balance and short term goals they can work on to get where they want to be. Patient discussed being discharged today and the importance of following up with his outpatient providers as well as opening up to others as ways to allow balance back into his life.  Verna Czech [place] 04/10/2012, 5:16 PM

## 2012-04-13 NOTE — Progress Notes (Signed)
Patient Discharge Instructions:  After Visit Summary (AVS):   Faxed to:  04/13/12 Psychiatric Admission Assessment Note:   Faxed to:  04/13/12 Suicide Risk Assessment - Discharge Assessment:   Faxed to:  04/13/12 Faxed/Sent to the Next Level Care provider:  04/13/12 Faxed to [facility] @ [phone]  Jerelene Redden, 04/13/2012, 3:54 PM

## 2012-04-21 NOTE — Discharge Summary (Signed)
Physician Discharge Summary Note  Patient:  Phillip Turner is an 51 y.o., male MRN:  [id number] DOB:  October 19, 1960 Patient phone:  [phone] (home)  Patient address:   [address] [place] [address],   Date of Admission:  04/01/2012 Date of Discharge: 04/09/2012  Reason for Admission:  Relapsed on Alcohol and Cocaine. Became acutely suicidal, got a knife and was contemplating killing himself.  Discharge Diagnoses: Principal Problem:  *Substance-induced disorder Active Problems:  Alcohol dependence  Cocaine abuse  Depression  DM (diabetes mellitus), type 2, uncontrolled with complications  HTN (hypertension)  Review of Systems  Constitutional: Negative.   HENT: Negative.   Eyes: Negative.   Respiratory: Negative.   Cardiovascular: Positive for leg swelling.  Gastrointestinal: Negative.   Genitourinary: Negative.   Musculoskeletal: Negative.   Skin: Negative.   Neurological: Negative.   Endo/Heme/Allergies: Negative.   Psychiatric/Behavioral: Negative.    Axis Diagnosis:   AXIS I:  Alcohol Dependence, Cocaine Abuse, Major Depression recurrent, Substance Induced Mood Disorder AXIS II:  Deferred AXIS III:   Past Medical History  Diagnosis Date  . Diabetes mellitus   . Hypertension   . Hypothyroidism   . Anxiety   . Depression   . Stroke     does not remember when   AXIS IV:  None identified AXIS V:  61-70 mild symptoms  Level of Care:  OP  Hospital Course: He was admitted, started in individual and group psychotherapy. He was detox with Librium. As the hospitalization progressed, he became more insightful in terms of needing to follow up on an outpatient basis. He neglected to do that after his last discharge. He also identified the need to have his wife informed of changes in his mood or cravings  that might trigger relapse. He had not done that as did not want to worry her. He also worked towards increasing his support system. While in the unit we  modified his medication regime to manage his Diabetes Mellitus and internal medicine was called in to help manage his BP.   Consults:  internal medicine  Significant Diagnostic Studies:  labs: as per the chart  Discharge Vitals:   Blood pressure 143/80, pulse 60, temperature 97.4 F (36.3 C), temperature source Oral, resp. rate 20, height 5\' 9"  (1.753 m), weight 142.429 kg (314 lb). Body mass index is 46.37 kg/(m^2). Lab Results:   No results found for this or any previous visit (from the past 72 hour(s)).  Physical Findings: AIMS: Facial and Oral Movements Muscles of Facial Expression: None, normal Lips and Perioral Area: None, normal Jaw: None, normal Tongue: None, normal,Extremity Movements Upper (arms, wrists, hands, fingers): None, normal Lower (legs, knees, ankles, toes): None, normal, Trunk Movements Neck, shoulders, hips: None, normal, Overall Severity Severity of abnormal movements (highest score from questions above): None, normal Incapacitation due to abnormal movements: None, normal Patient's awareness of abnormal movements (rate only patient's report): No Awareness, Dental Status Current problems with teeth and/or dentures?: No Does patient usually wear dentures?: No  CIWA:  CIWA-Ar Total: 1  COWS:  COWS Total Score: 0   Psychiatric Specialty Exam: See Psychiatric Specialty Exam and Suicide Risk Assessment completed by Attending Physician prior to discharge.  Discharge destination:  Home  Is patient on multiple antipsychotic therapies at discharge:  No   Has Patient had three or more failed trials of antipsychotic monotherapy by history:  No  Recommended Plan for Multiple Antipsychotic Therapies: N/A      Medication List  As of 04/21/2012  5:57 PM    STOP taking these medications         metFORMIN 500 MG tablet   Commonly known as: GLUCOPHAGE      QUEtiapine 50 MG tablet   Commonly known as: SEROQUEL      simvastatin 20 MG tablet   Commonly  known as: ZOCOR      TAKE these medications      Indication    albuterol (2.5 MG/3ML) 0.083% nebulizer solution   Commonly known as: PROVENTIL   Take 3 mLs (2.5 mg total) by nebulization every 6 (six) hours as needed for wheezing or shortness of breath. For shortness of breath       amLODipine 10 MG tablet   Commonly known as: NORVASC   Take 1 tablet (10 mg total) by mouth daily. For high blood pressure control       aspirin 325 MG tablet   Take 1 tablet (325 mg total) by mouth daily. For heart heart/stroke prevention    Indication: Prevent clots      colchicine 0.6 MG tablet   Take 1 tablet (0.6 mg total) by mouth daily. For gout flare-ups    Indication: Joint Inflammation due to Gout      febuxostat 40 MG tablet   Commonly known as: ULORIC   Take 1 tablet (40 mg total) by mouth daily. For hyperurecemia    Indication: High Amount of Uric Acid in the Blood      FLUoxetine 40 MG capsule   Commonly known as: PROZAC   Take 1 capsule (40 mg total) by mouth daily. For depression    Indication: Depression      folic acid 1 MG tablet   Commonly known as: FOLVITE   Take 1 tablet (1 mg total) by mouth daily. For folic acid supplement    Indication: Deficiency of Folic Acid in the Diet      furosemide 20 MG tablet   Commonly known as: LASIX   Take 1 tablet (20 mg total) by mouth daily. For swelling    Indication: High Blood Pressure      glyBURIDE 2.5 MG tablet   Commonly known as: DIABETA   Take 1 tablet (2.5 mg total) by mouth 2 (two) times daily with a meal. For high blood sugar control       levothyroxine 175 MCG tablet   Commonly known as: SYNTHROID, LEVOTHROID   Take 1 tablet (175 mcg total) by mouth daily before breakfast. For thyroid hormone replacement    Indication: Underactive Thyroid      lisinopril 20 MG tablet   Commonly known as: PRINIVIL,ZESTRIL   Take 1 tablet (20 mg total) by mouth daily. For high blood pressure control    Indication: High Blood Pressure       naproxen 500 MG tablet   Commonly known as: NAPROSYN   Take 1 tablet (500 mg total) by mouth 2 (two) times daily with a meal. For gouty arthritic pain    Indication: Acute Joint Inflammation in Gout      potassium chloride SA 20 MEQ tablet   Commonly known as: K-DUR,KLOR-CON   Take 2 tablets (40 mEq total) by mouth 4 (four) times daily. For potassium supplement    Indication: Low Amount of Potassium in the Blood      traZODone 100 MG tablet   Commonly known as: DESYREL   Take 2 tablets (200 mg total) by mouth at bedtime. For sleep/depression  Indication: Trouble Sleeping           Follow-up Information    Follow up with [facility]. On 04/10/2012. (Walk-in appointment scheduled for Friday, 12./6/13 at 8:00am)    Contact information:   [address], [place] [address] [phone] Fax [phone]         Follow-up recommendations:  Follow up with [facility]/ pursue AA and regular contact with his sponsor Follow up with PCP  Total Discharge Time:  Greater than 30 min  Signed: Amarionna Arca A 04/21/2012, 5:57 PM

## 2013-06-09 ENCOUNTER — Ambulatory Visit: Payer: Self-pay | Admitting: Physician Assistant

## 2013-06-23 ENCOUNTER — Encounter: Payer: Self-pay | Admitting: Physician Assistant

## 2013-06-23 ENCOUNTER — Other Ambulatory Visit (INDEPENDENT_AMBULATORY_CARE_PROVIDER_SITE_OTHER): Payer: Medicare HMO

## 2013-06-23 ENCOUNTER — Other Ambulatory Visit: Payer: Self-pay | Admitting: Physician Assistant

## 2013-06-23 ENCOUNTER — Ambulatory Visit (HOSPITAL_COMMUNITY)
Admission: RE | Admit: 2013-06-23 | Discharge: 2013-06-23 | Disposition: A | Discharge status: Home / Self Care | Payer: Medicare HMO | Source: Ambulatory Visit | Attending: Physician Assistant | Admitting: Physician Assistant

## 2013-06-23 ENCOUNTER — Ambulatory Visit (INDEPENDENT_AMBULATORY_CARE_PROVIDER_SITE_OTHER): Payer: Managed Care, Other (non HMO) | Admitting: Physician Assistant

## 2013-06-23 ENCOUNTER — Ambulatory Visit: Payer: Self-pay | Admitting: Physician Assistant

## 2013-06-23 VITALS — BP 150/96 | HR 56 | Temp 97.8°F | Ht 70.0 in | Wt 305.0 lb

## 2013-06-23 DIAGNOSIS — M1610 Unilateral primary osteoarthritis, unspecified hip: Secondary | ICD-10-CM | POA: Insufficient documentation

## 2013-06-23 DIAGNOSIS — I10 Essential (primary) hypertension: Secondary | ICD-10-CM

## 2013-06-23 DIAGNOSIS — M25559 Pain in unspecified hip: Secondary | ICD-10-CM | POA: Insufficient documentation

## 2013-06-23 DIAGNOSIS — E039 Hypothyroidism, unspecified: Secondary | ICD-10-CM

## 2013-06-23 DIAGNOSIS — I6350 Cerebral infarction due to unspecified occlusion or stenosis of unspecified cerebral artery: Secondary | ICD-10-CM

## 2013-06-23 DIAGNOSIS — M109 Gout, unspecified: Secondary | ICD-10-CM

## 2013-06-23 DIAGNOSIS — R7989 Other specified abnormal findings of blood chemistry: Secondary | ICD-10-CM

## 2013-06-23 DIAGNOSIS — M169 Osteoarthritis of hip, unspecified: Secondary | ICD-10-CM | POA: Insufficient documentation

## 2013-06-23 DIAGNOSIS — M25552 Pain in left hip: Secondary | ICD-10-CM

## 2013-06-23 DIAGNOSIS — I639 Cerebral infarction, unspecified: Secondary | ICD-10-CM

## 2013-06-23 DIAGNOSIS — Z0000 Encounter for general adult medical examination without abnormal findings: Secondary | ICD-10-CM

## 2013-06-23 DIAGNOSIS — E119 Type 2 diabetes mellitus without complications: Secondary | ICD-10-CM

## 2013-06-23 DIAGNOSIS — N529 Male erectile dysfunction, unspecified: Secondary | ICD-10-CM

## 2013-06-23 DIAGNOSIS — Z125 Encounter for screening for malignant neoplasm of prostate: Secondary | ICD-10-CM

## 2013-06-23 LAB — CBC WITH DIFFERENTIAL/PLATELET
Basophils Absolute: 0 K/uL (ref 0.0–0.1)
Basophils Relative: 0.3 % (ref 0.0–3.0)
Eosinophils Absolute: 0.2 K/uL (ref 0.0–0.7)
Eosinophils Relative: 2.7 % (ref 0.0–5.0)
HCT: 42.7 % (ref 39.0–52.0)
Hemoglobin: 14 g/dL (ref 13.0–17.0)
Lymphocytes Relative: 35.5 % (ref 12.0–46.0)
Lymphs Abs: 2.8 K/uL (ref 0.7–4.0)
MCHC: 32.7 g/dL (ref 30.0–36.0)
MCV: 87.6 fl (ref 78.0–100.0)
Monocytes Absolute: 0.9 K/uL (ref 0.1–1.0)
Monocytes Relative: 11 % (ref 3.0–12.0)
Neutro Abs: 4 K/uL (ref 1.4–7.7)
Neutrophils Relative %: 50.5 % (ref 43.0–77.0)
Platelets: 254 K/uL (ref 150.0–400.0)
RBC: 4.87 Mil/uL (ref 4.22–5.81)
RDW: 15 % — ABNORMAL HIGH (ref 11.5–14.6)
WBC: 7.8 K/uL (ref 4.5–10.5)

## 2013-06-23 LAB — URINALYSIS, ROUTINE W REFLEX MICROSCOPIC
BILIRUBIN URINE: NEGATIVE
HGB URINE DIPSTICK: NEGATIVE
KETONES UR: NEGATIVE
LEUKOCYTES UA: NEGATIVE
Nitrite: NEGATIVE
RBC / HPF: NONE SEEN (ref 0–?)
Specific Gravity, Urine: 1.01 (ref 1.000–1.030)
Total Protein, Urine: NEGATIVE
UROBILINOGEN UA: 0.2 (ref 0.0–1.0)
Urine Glucose: NEGATIVE
WBC, UA: NONE SEEN (ref 0–?)
pH: 5.5 (ref 5.0–8.0)

## 2013-06-23 LAB — HEMOGLOBIN A1C: HEMOGLOBIN A1C: 6.9 % — AB (ref 4.6–6.5)

## 2013-06-23 LAB — BASIC METABOLIC PANEL WITH GFR
BUN: 8 mg/dL (ref 6–23)
CO2: 24 meq/L (ref 19–32)
Calcium: 9.1 mg/dL (ref 8.4–10.5)
Chloride: 107 meq/L (ref 96–112)
Creatinine, Ser: 1.1 mg/dL (ref 0.4–1.5)
GFR: 90.21 mL/min
Glucose, Bld: 101 mg/dL — ABNORMAL HIGH (ref 70–99)
Potassium: 3.4 meq/L — ABNORMAL LOW (ref 3.5–5.1)
Sodium: 139 meq/L (ref 135–145)

## 2013-06-23 LAB — MICROALBUMIN / CREATININE URINE RATIO
Creatinine,U: 35.5 mg/dL
MICROALB/CREAT RATIO: 1.4 mg/g (ref 0.0–30.0)
Microalb, Ur: 0.5 mg/dL (ref 0.0–1.9)

## 2013-06-23 LAB — HEPATIC FUNCTION PANEL
ALT: 76 U/L — ABNORMAL HIGH (ref 0–53)
AST: 64 U/L — ABNORMAL HIGH (ref 0–37)
Albumin: 3.6 g/dL (ref 3.5–5.2)
Alkaline Phosphatase: 77 U/L (ref 39–117)
Bilirubin, Direct: 0.1 mg/dL (ref 0.0–0.3)
Total Bilirubin: 0.8 mg/dL (ref 0.3–1.2)
Total Protein: 6.4 g/dL (ref 6.0–8.3)

## 2013-06-23 LAB — TSH: TSH: 0.46 u[IU]/mL (ref 0.35–5.50)

## 2013-06-23 LAB — LIPID PANEL
Cholesterol: 230 mg/dL — ABNORMAL HIGH (ref 0–200)
HDL: 85.3 mg/dL
Total CHOL/HDL Ratio: 3
Triglycerides: 182 mg/dL — ABNORMAL HIGH (ref 0.0–149.0)
VLDL: 36.4 mg/dL (ref 0.0–40.0)

## 2013-06-23 LAB — LDL CHOLESTEROL, DIRECT: LDL DIRECT: 117.5 mg/dL

## 2013-06-23 LAB — PSA: PSA: 0.78 ng/mL (ref 0.10–4.00)

## 2013-06-23 MED ORDER — TADALAFIL 20 MG PO TABS: 10.0000 mg | ORAL_TABLET | ORAL | Status: DC | PRN

## 2013-06-23 MED ORDER — NAPROXEN 500 MG PO TABS: 500.0000 mg | ORAL_TABLET | Freq: Two times a day (BID) | ORAL | Status: DC

## 2013-06-23 NOTE — Progress Notes (Signed)
Patient ID: Phillip Turner is a 53 y.o. male DOB: [phone] MRN: [id number]     HPI: Patient is a 52 year old male who presents to the clinic to establish care. Previous provider at [facility]. Reports history of HTN, DM, Hypothyroidism, gout and history of stroke. Patient reports chronic conditions well controlled with current medications. Explains some difficulty with fasting blood glucose in the morning, states at times fbg is 176 and on those days he uses his wifes Novalog 70/30, 20 units, and numbers get better. Patient also reports history of ED, has had samples of Cialis 20 mg in past, with good success. Reports history of left hip pain. Pain does not radiate, worse with going up steps. Has used nothing for relief of pain. Reports history of depression, states it was after his stroke, much better now, states has had to learn to understand and accept his limitations regarding the speed at which he does things now and the difficulty he has expressing himself. States has not taken medication for depression in a while. Request refill for Naprosyn today. Denies chest pain/palpitations, change in bowel/bladder habits, visual changes/disturbances, SOB, cough, N/V/F, numbness or weakness.     Influenza: no  Tetanus:unknown Colonoscopy: Insure FIT: negative 02/02/13  ROS: As stated in HPI. All other systems negative  Past Medical History  Diagnosis Date  . Diabetes mellitus   . Hypertension   . Hypothyroidism   . Anxiety   . Depression   . Stroke     does not remember when   Family History  Problem Relation Age of Onset  . Coronary artery disease Mother   . Diabetes type II Father   . Hypertension Father    History   Social History  . Marital Status: Married    Spouse Name: N/A    Number of Children: N/A  . Years of Education: N/A   Social History Main Topics  . Smoking status: Former Smoker -- 5 years    Types: Cigarettes  . Smokeless tobacco: Never Used  .  Alcohol Use: 10.8 oz/week    18 Cans of beer per week     Comment: Pt consumes beers occasionally  . Drug Use: Yes    Special: "Crack" cocaine     Comment: Cocaine, crack   . Sexual Activity: Yes   Other Topics Concern  . None   Social History Narrative   Married   11th grade education   Not employed    Past Surgical History  Procedure Laterality Date  . Tonsillectomy     Current Outpatient Prescriptions on File Prior to Visit  Medication Sig Dispense Refill  . amLODipine (NORVASC) 10 MG tablet Take 1 tablet (10 mg total) by mouth daily. For high blood pressure control  30 tablet  0  . aspirin 325 MG tablet Take 1 tablet (325 mg total) by mouth daily. For heart heart/stroke prevention  30 tablet  0  . colchicine 0.6 MG tablet Take 1 tablet (0.6 mg total) by mouth daily. For gout flare-ups  30 tablet  0  . FLUoxetine (PROZAC) 40 MG capsule Take 1 capsule (40 mg total) by mouth daily. For depression  40 capsule  0  . folic acid (FOLVITE) 1 MG tablet Take 1 tablet (1 mg total) by mouth daily. For folic acid supplement  30 tablet  0  . furosemide (LASIX) 20 MG tablet Take 1 tablet (20 mg total) by mouth daily. For swelling  30 tablet  0  .  glyBURIDE (DIABETA) 2.5 MG tablet Take 1 tablet (2.5 mg total) by mouth 2 (two) times daily with a meal. For high blood sugar control  60 tablet  0  . levothyroxine (SYNTHROID, LEVOTHROID) 175 MCG tablet Take 1 tablet (175 mcg total) by mouth daily before breakfast. For thyroid hormone replacement  30 tablet  0  . lisinopril (PRINIVIL,ZESTRIL) 20 MG tablet Take 1 tablet (20 mg total) by mouth daily. For high blood pressure control  30 tablet  0  . naproxen (NAPROSYN) 500 MG tablet Take 1 tablet (500 mg total) by mouth 2 (two) times daily with a meal. For gouty arthritic pain  60 tablet  0  . potassium chloride SA (K-DUR,KLOR-CON) 20 MEQ tablet Take 2 tablets (40 mEq total) by mouth 4 (four) times daily. For potassium supplement      . traZODone  (DESYREL) 100 MG tablet Take 2 tablets (200 mg total) by mouth at bedtime. For sleep/depression  30 tablet  0  . albuterol (PROVENTIL) (2.5 MG/3ML) 0.083% nebulizer solution Take 3 mLs (2.5 mg total) by nebulization every 6 (six) hours as needed for wheezing or shortness of breath. For shortness of breath  75 mL  12   No current facility-administered medications on file prior to visit.   Allergies  Allergen Reactions  . Shellfish Allergy Anaphylaxis and Nausea And Vomiting  . Metformin And Related     Patient states it makes him sick    PE: CONSTITUTIONAL: Well developed, well nourished, pleasant, appears stated age, in NAD HEENT: normocephalic, atraumatic, bilateral ext/int canals normal. Bilateral TM's without injections, bulging, erythema. Nose normal, uvula midline, oropharynx clear and moist. EYES: PERRLA, bilateral EOM and conjunctiva normal NECK: FROM, supple, without thyromegaly or mass, no adenopathy noted.  CARDIO: RRR, normal S1 and S2, distal pulses intact. PULM/CHEST CTA bilateral, no wheezes, rales or rhonchi. Non tender. ABD: appearance normal, soft, nontender. Normal bowel sounds x 4 quadrants GU: no anal fissures or hemorrhoids noted. Prostate symmetrical, boggy, non tender, without palpable nodules. No guaiac done. Exam chaperoned by Erline Levine, CMA. MUSC: FROM U/LE bilateral, unable to reproduce pain with palpation or ROM.  diabetic foot exam complete. LYMPH: no cervical, supraclavicular adenopathy NEURO: alert and oriented x 3, no cranial nerve deficit, motor strength and coordination NL. Negative romberg. Gait normal. SKIN: warm, dry, no rash or lesions noted. PSYCH: Mood and affect normal, speech normal.   Lab Results  Component Value Date   WBC 9.3 03/31/2012   HGB 15.0 03/31/2012   HCT 42.8 03/31/2012   PLT 357 03/31/2012   GLUCOSE 65* 04/02/2012   ALT 55* 03/31/2012   AST 47* 03/31/2012   NA 144 04/02/2012   K 3.7 04/02/2012   CL 105 04/02/2012    CREATININE 1.54* 04/02/2012   BUN 11 04/02/2012   CO2 17* 04/02/2012   TSH 0.170* 03/12/2012   HGBA1C 8.0* 03/08/2012   ECG: Sinus Rhythm, rate 70 bpm.  ASSESSMENT and PLAN   CPX/v70.0 - Patient has been counseled on age-appropriate routine health concerns for screening and prevention. These are reviewed and up-to-date. Immunizations are up-to-date or declined. Labs ordered and ECG reviewed.  HTN: Controlled with Lasix, amlodipine, Lisinopril, meds not taken prior to todays appointment.  ECG today.  DM: Controlled with Glyburide and Novolog (as historical medication used PRN )  Hypothyroidism Well controlled on Levothyroxine  Gout: Well controlled with colchicine, Uloric and Naprosyn  ED: Rx for Cialis  Hip pain, left Hip xray today Naprosyn for pain relief.

## 2013-06-23 NOTE — Progress Notes (Signed)
Pre visit review using our clinic review tool, if applicable. No additional management support is needed unless otherwise documented below in the visit note. 

## 2013-06-23 NOTE — Patient Instructions (Signed)
It was great meeting you today Mr.  Phillip Turner!  Labs have been ordered for you, when you report to lab please be fasting.    Health Maintenance, Males A healthy lifestyle and preventative care can promote health and wellness.  Maintain regular health, dental, and eye exams.  Eat a healthy diet. Foods like vegetables, fruits, whole grains, low-fat dairy products, and lean protein foods contain the nutrients you need and are low in calories. Decrease your intake of foods high in solid fats, added sugars, and salt. Get information about a proper diet from your health care provider, if necessary.  Regular physical exercise is one of the most important things you can do for your health. Most adults should get at least 150 minutes of moderate-intensity exercise (any activity that increases your heart rate and causes you to sweat) each week. In addition, most adults need muscle-strengthening exercises on 2 or more days a week.   Maintain a healthy weight. The body mass index (BMI) is a screening tool to identify possible weight problems. It provides an estimate of body fat based on height and weight. Your health care provider can find your BMI and can help you achieve or maintain a healthy weight. For males 20 years and older:  A BMI below 18.5 is considered underweight.  A BMI of 18.5 to 24.9 is normal.  A BMI of 25 to 29.9 is considered overweight.  A BMI of 30 and above is considered obese.  Maintain normal blood lipids and cholesterol by exercising and minimizing your intake of saturated fat. Eat a balanced diet with plenty of fruits and vegetables. Blood tests for lipids and cholesterol should begin at age 61 and be repeated every 5 years. If your lipid or cholesterol levels are high, you are over 50, or you are at high risk for heart disease, you may need your cholesterol levels checked more frequently.Ongoing high lipid and cholesterol levels should be treated with medicines, if diet and  exercise are not working.  If you smoke, find out from your health care provider how to quit. If you do not use tobacco, do not start.  Lung cancer screening is recommended for adults aged 73 80 years who are at high risk for developing lung cancer because of a history of smoking. A yearly low-dose CT scan of the lungs is recommended for people who have at least a 30-pack-year history of smoking and are a current smoker or have quit within the past 15 years. A pack year of smoking is smoking an average of 1 pack of cigarettes a day for 1 year (for example, a 30-pack-year history of smoking could mean smoking 1 pack a day for 30 years or 2 packs a day for 15 years). Yearly screening should continue until the smoker has stopped smoking for at least 15 years. Yearly screening should be stopped for people who develop a health problem that would prevent them from having lung cancer treatment.  If you choose to drink alcohol, do not have more than 2 drinks per day. One drink is considered to be 12 oz (360 mL) of beer, 5 oz (150 mL) of wine, or 1.5 oz (45 mL) of liquor.  Avoid use of street drugs. Do not share needles with anyone. Ask for help if you need support or instructions about stopping the use of drugs.  High blood pressure causes heart disease and increases the risk of stroke. Blood pressure should be checked at least every 1 2 years. Ongoing  high blood pressure should be treated with medicines if weight loss and exercise are not effective.  If you are 96 53 years old, ask your health care provider if you should take aspirin to prevent heart disease.  Diabetes screening involves taking a blood sample to check your fasting blood sugar level. This should be done once every 3 years after age 75, if you are at a normal weight and without risk factors for diabetes. Testing should be considered at a younger age or be carried out more frequently if you are overweight and have at least 1 risk factor for  diabetes.  Colorectal cancer can be detected and often prevented. Most routine colorectal cancer screening begins at the age of 67 and continues through age 24. However, your health care provider may recommend screening at an earlier age if you have risk factors for colon cancer. On a yearly basis, your health care provider may provide home test kits to check for hidden blood in the stool. A small camera at the end of a tube may be used to directly examine the colon (sigmoidoscopy or colonoscopy) to detect the earliest forms of colorectal cancer. Talk to your health care provider about this at age 16, when routine screening begins. A direct exam of the colon should be repeated every 5 10 years through age 74, unless early forms of pre-cancerous polyps or small growths are found.  People who are at an increased risk for hepatitis B should be screened for this virus. You are considered at high risk for hepatitis B if:  You were born in a country where hepatitis B occurs often. Talk with your health care provider about which countries are considered high-risk.  Your parents were born in a high-risk country and you have not received a shot to protect against hepatitis B (hepatitis B vaccine).  You have HIV or AIDS.  You use needles to inject street drugs.  You live with, or have sex with, someone who has hepatitis B.  You are a man who has sex with other men (MSM).  You get hemodialysis treatment.  You take certain medicines for conditions like cancer, organ transplantation, and autoimmune conditions.  Hepatitis C blood testing is recommended for all people born from [birth year] through 1965 and any individual with known risk factors for hepatitis C.  Healthy men should no longer receive prostate-specific antigen (PSA) blood tests as part of routine cancer screening. Talk to your health care provider about prostate cancer screening.  Testicular cancer screening is not recommended for adolescents or  adult males who have no symptoms. Screening includes self-exam, a health care provider exam, and other screening tests. Consult with your health care provider about any symptoms you have or any concerns you have about testicular cancer.  Practice safe sex. Use condoms and avoid high-risk sexual practices to reduce the spread of sexually transmitted infections (STIs).  Use sunscreen. Apply sunscreen liberally and repeatedly throughout the day. You should seek shade when your shadow is shorter than you. Protect yourself by wearing long sleeves, pants, a wide-brimmed hat, and sunglasses year round, whenever you are outdoors.  Tell your health care provider of new moles or changes in moles, especially if there is a change in shape or color. Also tell your provider if a mole is larger than the size of a pencil eraser.  A one-time screening for abdominal aortic aneurysm (AAA) and surgical repair of large AAAs by ultrasound is recommended for men aged 67 75 years who  are current or former smokers.  Stay current with your vaccines (immunizations). Document Released: 10/19/2007 Document Revised: 02/10/2013 Document Reviewed: 09/17/2010 [facility] Patient Information 2014 [place], [place].

## 2013-06-24 ENCOUNTER — Other Ambulatory Visit: Payer: Self-pay | Admitting: Physician Assistant

## 2013-06-24 DIAGNOSIS — E119 Type 2 diabetes mellitus without complications: Secondary | ICD-10-CM

## 2013-06-24 DIAGNOSIS — E7800 Pure hypercholesterolemia, unspecified: Secondary | ICD-10-CM

## 2013-06-24 MED ORDER — INSULIN ASPART 100 UNIT/ML ~~LOC~~ SOLN: 20.0000 [IU] | Freq: Two times a day (BID) | SUBCUTANEOUS | Status: DC | PRN

## 2013-06-24 MED ORDER — ALLOPURINOL 300 MG PO TABS: 300.0000 mg | ORAL_TABLET | Freq: Every day | ORAL | Status: DC

## 2013-07-29 ENCOUNTER — Telehealth: Payer: Self-pay | Admitting: *Deleted

## 2013-07-29 NOTE — Telephone Encounter (Signed)
Pt called states since starting the Allopurinol he has began having ankle and calf swelling.  Please advise

## 2013-07-30 NOTE — Telephone Encounter (Signed)
Please phone patient and inform him to stop taking the Allopurinol and to schedule a follow up appointment for evaluation.

## 2013-07-30 NOTE — Telephone Encounter (Signed)
Phone call from patient today at 10:59 stating he had not heard back regarding his msg yesterday regarding his ankles and calves swelling since he started taking Allopurinol. Please advise.

## 2013-07-30 NOTE — Telephone Encounter (Signed)
[email or web address]

## 2013-08-04 ENCOUNTER — Other Ambulatory Visit: Payer: Self-pay | Admitting: *Deleted

## 2013-08-04 MED ORDER — GLYBURIDE 2.5 MG PO TABS: 2.5000 mg | ORAL_TABLET | Freq: Two times a day (BID) | ORAL | Status: DC

## 2013-08-04 NOTE — Telephone Encounter (Signed)
Pt.notified

## 2013-08-05 ENCOUNTER — Other Ambulatory Visit: Payer: Self-pay | Admitting: *Deleted

## 2013-08-05 MED ORDER — FUROSEMIDE 20 MG PO TABS: 20.0000 mg | ORAL_TABLET | Freq: Every day | ORAL | Status: DC

## 2013-08-05 MED ORDER — LISINOPRIL 20 MG PO TABS: 20.0000 mg | ORAL_TABLET | Freq: Every day | ORAL | Status: DC

## 2013-08-09 ENCOUNTER — Ambulatory Visit: Payer: Self-pay | Admitting: Physician Assistant

## 2013-08-13 ENCOUNTER — Ambulatory Visit: Payer: Self-pay | Admitting: Internal Medicine

## 2013-08-18 ENCOUNTER — Other Ambulatory Visit: Payer: Self-pay | Admitting: *Deleted

## 2013-08-18 MED ORDER — LEVOTHYROXINE SODIUM 175 MCG PO TABS: 175.0000 ug | ORAL_TABLET | Freq: Every day | ORAL | Status: DC

## 2013-08-23 ENCOUNTER — Ambulatory Visit: Payer: Self-pay | Admitting: Internal Medicine

## 2013-08-31 ENCOUNTER — Other Ambulatory Visit: Payer: Self-pay | Admitting: Internal Medicine

## 2013-09-01 ENCOUNTER — Encounter (HOSPITAL_COMMUNITY): Payer: Self-pay | Admitting: Emergency Medicine

## 2013-09-01 ENCOUNTER — Emergency Department (HOSPITAL_COMMUNITY)
Admission: EM | Admit: 2013-09-01 | Discharge: 2013-09-01 | Disposition: A | Discharge status: Home / Self Care | Payer: Medicare HMO | Attending: Emergency Medicine | Admitting: Emergency Medicine

## 2013-09-01 DIAGNOSIS — E039 Hypothyroidism, unspecified: Secondary | ICD-10-CM | POA: Insufficient documentation

## 2013-09-01 DIAGNOSIS — Z87891 Personal history of nicotine dependence: Secondary | ICD-10-CM | POA: Insufficient documentation

## 2013-09-01 DIAGNOSIS — E119 Type 2 diabetes mellitus without complications: Secondary | ICD-10-CM | POA: Insufficient documentation

## 2013-09-01 DIAGNOSIS — F3289 Other specified depressive episodes: Secondary | ICD-10-CM | POA: Insufficient documentation

## 2013-09-01 DIAGNOSIS — F329 Major depressive disorder, single episode, unspecified: Secondary | ICD-10-CM | POA: Insufficient documentation

## 2013-09-01 DIAGNOSIS — Z8673 Personal history of transient ischemic attack (TIA), and cerebral infarction without residual deficits: Secondary | ICD-10-CM | POA: Insufficient documentation

## 2013-09-01 DIAGNOSIS — M109 Gout, unspecified: Secondary | ICD-10-CM | POA: Insufficient documentation

## 2013-09-01 DIAGNOSIS — Z794 Long term (current) use of insulin: Secondary | ICD-10-CM | POA: Insufficient documentation

## 2013-09-01 DIAGNOSIS — F411 Generalized anxiety disorder: Secondary | ICD-10-CM | POA: Insufficient documentation

## 2013-09-01 DIAGNOSIS — Z79899 Other long term (current) drug therapy: Secondary | ICD-10-CM | POA: Insufficient documentation

## 2013-09-01 DIAGNOSIS — Z7982 Long term (current) use of aspirin: Secondary | ICD-10-CM | POA: Insufficient documentation

## 2013-09-01 DIAGNOSIS — I10 Essential (primary) hypertension: Secondary | ICD-10-CM | POA: Insufficient documentation

## 2013-09-01 HISTORY — DX: Gout, unspecified: M10.9

## 2013-09-01 MED ORDER — OXYCODONE-ACETAMINOPHEN 5-325 MG PO TABS: 1.0000 | ORAL_TABLET | ORAL | Status: DC | PRN

## 2013-09-01 MED ORDER — PREDNISONE 20 MG PO TABS: 40.0000 mg | ORAL_TABLET | Freq: Every day | ORAL | Status: DC

## 2013-09-01 NOTE — ED Provider Notes (Signed)
Medical screening examination/treatment/procedure(s) were performed by non-physician practitioner and as supervising physician I was immediately available for consultation/collaboration.   EKG Interpretation None        Julianne Rice, MD 09/01/13 [phone]

## 2013-09-01 NOTE — ED Provider Notes (Signed)
CSN: [id number]     Arrival date & time 09/01/13  0540 History   First MD Initiated Contact with Patient 09/01/13 [phone]     Chief Complaint  Patient presents with  . Gout     (Consider location/radiation/quality/duration/timing/severity/associated sxs/prior Treatment) The history is provided by the patient and medical records.   This is a 53 year old male with past medical history significant for hypertension, diabetes, hyperthyroidism, anxiety, depression, gout, presenting to the ED for gout flare. Patient states he's had pain and swelling of his right ankle and foot for the past 10 days.  No recent injury, trauma, or falls.  States he changed PCP's, and was switched from colchicine to allopurinol but was having adverse reactions to allopurinol. He states he called his PCP and was instructed to stop both medications until FU next week but has since developed gout flare worse than normal. States he did restart his colchicine 3 days ago without significant improvement.   States gout was well controlled on colchicine previously.  No recent fevers or chills.  VS stable on arrival.  Past Medical History  Diagnosis Date  . Diabetes mellitus   . Hypertension   . Hypothyroidism   . Anxiety   . Depression   . Stroke     does not remember when  . Gout    Past Surgical History  Procedure Laterality Date  . Tonsillectomy     Family History  Problem Relation Age of Onset  . Coronary artery disease Mother   . Diabetes type II Father   . Hypertension Father    History  Substance Use Topics  . Smoking status: Former Smoker -- 5 years    Types: Cigarettes  . Smokeless tobacco: Never Used  . Alcohol Use: 10.8 oz/week    18 Cans of beer per week     Comment: Pt consumes beers occasionally    Review of Systems  Musculoskeletal: Positive for arthralgias and joint swelling.  All other systems reviewed and are negative.     Allergies  Shellfish allergy and Metformin and related  Home  Medications   Prior to Admission medications   Medication Sig Start Date End Date Taking? Authorizing Provider  acetaminophen (TYLENOL 8 HOUR) 650 MG CR tablet Take 1,950 mg by mouth every 8 (eight) hours as needed for pain.   Yes Historical Provider, MD  acetaminophen (TYLENOL) 500 MG tablet Take 1,500 mg by mouth every 8 (eight) hours as needed for mild pain.   Yes Historical Provider, MD  amLODipine (NORVASC) 10 MG tablet Take 1 tablet (10 mg total) by mouth daily. For high blood pressure control 04/09/12  Yes Encarnacion Slates, NP  aspirin 325 MG tablet Take 1 tablet (325 mg total) by mouth daily. For heart heart/stroke prevention 04/09/12  Yes Encarnacion Slates, NP  colchicine 0.6 MG tablet Take 1 tablet (0.6 mg total) by mouth daily. For gout flare-ups 04/09/12  Yes Encarnacion Slates, NP  furosemide (LASIX) 20 MG tablet Take 1 tablet (20 mg total) by mouth daily. For swelling 08/05/13  Yes Evie Lacks Plotnikov, MD  glyBURIDE (DIABETA) 2.5 MG tablet Take 1 tablet (2.5 mg total) by mouth 2 (two) times daily with a meal. For high blood sugar control 08/04/13  Yes Evie Lacks Plotnikov, MD  insulin aspart (NOVOLOG) 100 UNIT/ML injection Inject 20 Units into the skin 2 (two) times daily as needed for high blood sugar. Inject into the skin 2 (two) times daily as needed for blood sugar greater  than 160. 06/24/13  Yes Stacy Gardner, PA-C  levothyroxine (SYNTHROID, LEVOTHROID) 175 MCG tablet Take 1 tablet (175 mcg total) by mouth daily before breakfast. For thyroid hormone replacement 08/18/13  Yes Evie Lacks Plotnikov, MD  lisinopril (PRINIVIL,ZESTRIL) 20 MG tablet Take 1 tablet (20 mg total) by mouth daily. For high blood pressure control 08/05/13  Yes Evie Lacks Plotnikov, MD  naphazoline-pheniramine (NAPHCON-A) 0.025-0.3 % ophthalmic solution Place 1 drop into both eyes 4 (four) times daily as needed for irritation.   Yes Historical Provider, MD  potassium chloride SA (K-DUR,KLOR-CON) 20 MEQ tablet Take 2 tablets (40 mEq total)  by mouth 4 (four) times daily. For potassium supplement 04/09/12  Yes Encarnacion Slates, NP  traZODone (DESYREL) 100 MG tablet Take 2 tablets (200 mg total) by mouth at bedtime. For sleep/depression 04/09/12  Yes Encarnacion Slates, NP  albuterol (PROVENTIL) (2.5 MG/3ML) 0.083% nebulizer solution Take 3 mLs (2.5 mg total) by nebulization every 6 (six) hours as needed for wheezing or shortness of breath. For shortness of breath 04/09/12 04/09/13  Encarnacion Slates, NP   BP 167/87  Pulse 70  Temp(Src) 97.9 F (36.6 C) (Oral)  Resp 20  Ht 5\' 11"  (1.803 m)  Wt 300 lb (136.079 kg)  BMI 41.86 kg/m2  SpO2 97%  Physical Exam  Nursing note and vitals reviewed. Constitutional: He is oriented to person, place, and time. He appears well-developed and well-nourished. No distress.  HENT:  Head: Normocephalic and atraumatic.  Mouth/Throat: Oropharynx is clear and moist.  Eyes: Conjunctivae and EOM are normal. Pupils are equal, round, and reactive to light.  Neck: Normal range of motion. Neck supple.  Cardiovascular: Normal rate, regular rhythm and normal heart sounds.   Pulmonary/Chest: Effort normal and breath sounds normal. No respiratory distress. He has no wheezes.  Musculoskeletal:       Right ankle: He exhibits decreased range of motion and swelling.       Right foot: He exhibits tenderness and swelling.  Diffuse swelling and erythema of right ankle and foot; warmth to touch when compared with left; limited ROM due to pain; no gross deformities; strong distal pulse and cap refill; sensation intact diffusely; moving all toes appropriately  Neurological: He is alert and oriented to person, place, and time.  Skin: Skin is warm and dry. He is not diaphoretic.  Psychiatric: He has a normal mood and affect.    ED Course  Procedures (including critical care time) Labs Review Labs Reviewed - No data to display  Imaging Review No results found.   EKG Interpretation None      MDM   Final diagnoses:   Gout flare   Sx likely related to gout flare, doubt septic joint.  No recent injuries or trauma to suggest fx.  Pt will continue home colchicine, add prednisone and percocet.  Pt has previously scheduled FU with PCP next week.  Discussed plan with patient, he/she acknowledged understanding and agreed with plan of care.  Return precautions given for new or worsening symptoms.  Larene Pickett, PA-C 09/01/13 [phone]

## 2013-09-01 NOTE — Discharge Instructions (Signed)
Take the prescribed medication as directed.  Continue taking your colchicine. Follow-up with your primary care physician as previously scheduled. Return to the ED for new or worsening symptoms.

## 2013-09-01 NOTE — ED Notes (Signed)
Patient is alert and oriented x3.  He was given DC instructions and follow up visit instructions.  Patient gave verbal understanding.  He was DC ambulatory under his own power to home.  V/S stable.  He was not showing any signs of distress on DC 

## 2013-09-01 NOTE — ED Notes (Signed)
Pt reports R ankle and foot pain that has lasted 7 days. Pt reports having a hx of gout and had a recent medication switched that was then stopped. Pt noticed that he had increased swelling in the mornings and that this time the swelling and pain lasted longer. Pt alert and oriented sitting in wheelchair. Pt states that he is unable to bear weight to R foot.

## 2013-09-02 ENCOUNTER — Telehealth: Payer: Self-pay | Admitting: *Deleted

## 2013-09-02 ENCOUNTER — Other Ambulatory Visit: Payer: Self-pay | Admitting: *Deleted

## 2013-09-02 MED ORDER — GLYBURIDE 2.5 MG PO TABS: 2.5000 mg | ORAL_TABLET | Freq: Two times a day (BID) | ORAL | Status: DC

## 2013-09-02 MED ORDER — AMLODIPINE BESYLATE 10 MG PO TABS: 10.0000 mg | ORAL_TABLET | Freq: Every day | ORAL | Status: DC

## 2013-09-02 NOTE — Telephone Encounter (Signed)
Rf req for Trazodone 100 mg 2 po qhs prn sleep. # 60. Ok to Rf?

## 2013-09-03 MED ORDER — TRAZODONE HCL 100 MG PO TABS: 200.0000 mg | ORAL_TABLET | Freq: Every day | ORAL | Status: DC

## 2013-09-03 NOTE — Telephone Encounter (Signed)
OK to fill this prescription with additional refills x5 Thank you!  

## 2013-09-03 NOTE — Telephone Encounter (Signed)
Prescription has been routed electronically.

## 2013-09-08 ENCOUNTER — Other Ambulatory Visit (INDEPENDENT_AMBULATORY_CARE_PROVIDER_SITE_OTHER): Payer: Managed Care, Other (non HMO)

## 2013-09-08 ENCOUNTER — Ambulatory Visit (INDEPENDENT_AMBULATORY_CARE_PROVIDER_SITE_OTHER): Payer: Managed Care, Other (non HMO) | Admitting: Internal Medicine

## 2013-09-08 ENCOUNTER — Encounter: Payer: Self-pay | Admitting: Internal Medicine

## 2013-09-08 VITALS — BP 170/90 | HR 80 | Temp 98.8°F | Resp 16 | Ht 71.0 in | Wt 305.0 lb

## 2013-09-08 DIAGNOSIS — E1165 Type 2 diabetes mellitus with hyperglycemia: Secondary | ICD-10-CM

## 2013-09-08 DIAGNOSIS — F1410 Cocaine abuse, uncomplicated: Secondary | ICD-10-CM

## 2013-09-08 DIAGNOSIS — E118 Type 2 diabetes mellitus with unspecified complications: Secondary | ICD-10-CM

## 2013-09-08 DIAGNOSIS — I6350 Cerebral infarction due to unspecified occlusion or stenosis of unspecified cerebral artery: Secondary | ICD-10-CM

## 2013-09-08 DIAGNOSIS — I639 Cerebral infarction, unspecified: Secondary | ICD-10-CM

## 2013-09-08 DIAGNOSIS — I10 Essential (primary) hypertension: Secondary | ICD-10-CM

## 2013-09-08 DIAGNOSIS — E039 Hypothyroidism, unspecified: Secondary | ICD-10-CM

## 2013-09-08 DIAGNOSIS — IMO0002 Reserved for concepts with insufficient information to code with codable children: Secondary | ICD-10-CM

## 2013-09-08 DIAGNOSIS — F1020 Alcohol dependence, uncomplicated: Secondary | ICD-10-CM

## 2013-09-08 LAB — GLUCOSE, POCT (MANUAL RESULT ENTRY): POC Glucose: 199 mg/dl — AB (ref 70–99)

## 2013-09-08 LAB — CBC WITH DIFFERENTIAL/PLATELET
BASOS PCT: 1.3 % (ref 0.0–3.0)
Basophils Absolute: 0.1 10*3/uL (ref 0.0–0.1)
Eosinophils Absolute: 0 10*3/uL (ref 0.0–0.7)
Eosinophils Relative: 0.3 % (ref 0.0–5.0)
HEMATOCRIT: 44.3 % (ref 39.0–52.0)
Hemoglobin: 14.8 g/dL (ref 13.0–17.0)
Lymphocytes Relative: 20.9 % (ref 12.0–46.0)
Lymphs Abs: 2.4 10*3/uL (ref 0.7–4.0)
MCHC: 33.3 g/dL (ref 30.0–36.0)
MCV: 83.2 fl (ref 78.0–100.0)
MONO ABS: 0.4 10*3/uL (ref 0.1–1.0)
MONOS PCT: 3.4 % (ref 3.0–12.0)
Neutro Abs: 8.6 10*3/uL — ABNORMAL HIGH (ref 1.4–7.7)
Neutrophils Relative %: 74.1 % (ref 43.0–77.0)
Platelets: 498 10*3/uL — ABNORMAL HIGH (ref 150.0–400.0)
RBC: 5.33 Mil/uL (ref 4.22–5.81)
RDW: 15.9 % — ABNORMAL HIGH (ref 11.5–15.5)
WBC: 11.6 10*3/uL — AB (ref 4.0–10.5)

## 2013-09-08 LAB — HEMOGLOBIN A1C: HEMOGLOBIN A1C: 7.5 % — AB (ref 4.6–6.5)

## 2013-09-08 LAB — URINALYSIS
Bilirubin Urine: NEGATIVE
HGB URINE DIPSTICK: NEGATIVE
Ketones, ur: NEGATIVE
LEUKOCYTES UA: NEGATIVE
NITRITE: NEGATIVE
Specific Gravity, Urine: 1.015 (ref 1.000–1.030)
URINE GLUCOSE: NEGATIVE
UROBILINOGEN UA: 0.2 (ref 0.0–1.0)
pH: 7 (ref 5.0–8.0)

## 2013-09-08 LAB — BASIC METABOLIC PANEL
BUN: 10 mg/dL (ref 6–23)
CHLORIDE: 102 meq/L (ref 96–112)
CO2: 27 mEq/L (ref 19–32)
Calcium: 9.3 mg/dL (ref 8.4–10.5)
Creatinine, Ser: 1.1 mg/dL (ref 0.4–1.5)
GFR: 88.28 mL/min (ref >60.00)
Glucose, Bld: 188 mg/dL — ABNORMAL HIGH (ref 70–99)
POTASSIUM: 4.1 meq/L (ref 3.5–5.1)
Sodium: 138 mEq/L (ref 135–145)

## 2013-09-08 LAB — HEPATIC FUNCTION PANEL
ALBUMIN: 4 g/dL (ref 3.5–5.2)
ALK PHOS: 115 U/L (ref 39–117)
ALT: 42 U/L (ref 0–53)
AST: 30 U/L (ref 0–37)
BILIRUBIN DIRECT: 0.1 mg/dL (ref 0.0–0.3)
Total Bilirubin: 0.7 mg/dL (ref 0.2–1.2)
Total Protein: 7.5 g/dL (ref 6.0–8.3)

## 2013-09-08 LAB — TSH: TSH: 0.11 u[IU]/mL — ABNORMAL LOW (ref 0.35–4.50)

## 2013-09-08 LAB — MICROALBUMIN / CREATININE URINE RATIO
CREATININE, U: 92.1 mg/dL
MICROALB UR: 5.6 mg/dL — AB (ref 0.0–1.9)
MICROALB/CREAT RATIO: 6.1 mg/g (ref 0.0–30.0)

## 2013-09-08 LAB — URIC ACID: Uric Acid, Serum: 8.4 mg/dL — ABNORMAL HIGH (ref 4.0–7.8)

## 2013-09-08 LAB — SEDIMENTATION RATE: Sed Rate: 10 mm/hr (ref 0–22)

## 2013-09-08 MED ORDER — COLCHICINE 0.6 MG PO TABS: 0.6000 mg | ORAL_TABLET | Freq: Every day | ORAL | Status: DC

## 2013-09-08 MED ORDER — LEVOTHYROXINE SODIUM 175 MCG PO TABS: 175.0000 ug | ORAL_TABLET | Freq: Every day | ORAL | Status: DC

## 2013-09-08 MED ORDER — COLCHICINE 0.6 MG PO TABS: 0.6000 mg | ORAL_TABLET | Freq: Two times a day (BID) | ORAL | Status: DC

## 2013-09-08 MED ORDER — FUROSEMIDE 40 MG PO TABS: 40.0000 mg | ORAL_TABLET | Freq: Every day | ORAL | Status: DC | PRN

## 2013-09-08 MED ORDER — DICLOFENAC SODIUM 75 MG PO TBEC: 75.0000 mg | DELAYED_RELEASE_TABLET | Freq: Two times a day (BID) | ORAL | Status: DC | PRN

## 2013-09-08 MED ORDER — INSULIN ASPART PROT & ASPART (70-30 MIX) 100 UNIT/ML PEN: PEN_INJECTOR | SUBCUTANEOUS | Status: DC

## 2013-09-08 MED ORDER — VITAMIN D 1000 UNITS PO TABS: 1000.0000 [IU] | ORAL_TABLET | Freq: Every day | ORAL | Status: DC

## 2013-09-08 NOTE — Assessment & Plan Note (Signed)
BP Readings from Last 3 Encounters:  09/08/13 170/90  09/01/13 167/87  06/23/13 150/96

## 2013-09-08 NOTE — Assessment & Plan Note (Signed)
In remission since 2013 

## 2013-09-08 NOTE — Assessment & Plan Note (Signed)
Dry since 2013 

## 2013-09-08 NOTE — Progress Notes (Signed)
Pre visit review using our clinic review tool, if applicable. No additional management support is needed unless otherwise documented below in the visit note. 

## 2013-09-08 NOTE — Progress Notes (Signed)
   Subjective:    HPI Transfer from Stacy Gardner  C/o gout in R foot/ankle - Colcrys helped  The patient presents for a follow-up of  chronic hypertension, CVA, chronic dyslipidemia, type 2 diabetes controlled with medicines  He is an alcoholic - dry since 2778 Denies drugs x 2 years  BP Readings from Last 3 Encounters:  09/08/13 170/90  09/01/13 167/87  06/23/13 150/96    Wt Readings from Last 3 Encounters:  09/08/13 305 lb (138.347 kg)  09/01/13 300 lb (136.079 kg)  06/23/13 305 lb (138.347 kg)    Review of Systems  Constitutional: Negative for appetite change, fatigue and unexpected weight change.  HENT: Negative for congestion, nosebleeds, sneezing, sore throat and trouble swallowing.   Eyes: Negative for itching and visual disturbance.  Respiratory: Negative for cough.   Cardiovascular: Negative for chest pain, palpitations and leg swelling.  Gastrointestinal: Negative for nausea, diarrhea, blood in stool and abdominal distention.  Genitourinary: Negative for frequency and hematuria.  Musculoskeletal: Negative for back pain, gait problem, joint swelling and neck pain.  Skin: Negative for rash.  Neurological: Negative for dizziness, tremors, speech difficulty and weakness.  Psychiatric/Behavioral: Negative for sleep disturbance, self-injury, dysphoric mood and agitation. The patient is not nervous/anxious.        Objective:   Physical Exam  Constitutional: He is oriented to person, place, and time. No distress.  Obese   Eyes: No scleral icterus.  Pulmonary/Chest: No stridor. He has no wheezes.  Musculoskeletal: He exhibits edema (1+ B) and tenderness (R ankle).  Neurological: He is alert and oriented to person, place, and time. No cranial nerve deficit. He exhibits normal muscle tone. Coordination abnormal.  dysarthric  Psychiatric: Judgment normal.   Lab Results  Component Value Date   WBC 11.6* 09/08/2013   HGB 14.8 09/08/2013   HCT 44.3 09/08/2013   PLT  498.0* 09/08/2013   GLUCOSE 188* 09/08/2013   CHOL 230* 06/23/2013   TRIG 182.0* 06/23/2013   HDL 85.30 06/23/2013   LDLDIRECT 117.5 06/23/2013   ALT 42 09/08/2013   AST 30 09/08/2013   NA 138 09/08/2013   K 4.1 09/08/2013   CL 102 09/08/2013   CREATININE 1.1 09/08/2013   BUN 10 09/08/2013   CO2 27 09/08/2013   TSH 0.11* 09/08/2013   PSA 0.78 06/23/2013   HGBA1C 7.5* 09/08/2013   MICROALBUR 5.6* 09/08/2013        Assessment & Plan:

## 2013-09-08 NOTE — Assessment & Plan Note (Signed)
Continue with current prescription therapy as reflected on the Med list.  

## 2013-09-08 NOTE — Assessment & Plan Note (Addendum)
Remote ?2005 dysarthric Brain MRI 2013 IMPRESSION:  Chronic infarct left putamen. No acute abnormality.  Original Report Authenticated By: Carl Best, M.D.

## 2013-09-13 ENCOUNTER — Telehealth: Payer: Self-pay | Admitting: *Deleted

## 2013-09-13 ENCOUNTER — Other Ambulatory Visit: Payer: Self-pay | Admitting: Internal Medicine

## 2013-09-13 DIAGNOSIS — E039 Hypothyroidism, unspecified: Secondary | ICD-10-CM

## 2013-09-13 NOTE — Telephone Encounter (Signed)
Phillip Turner, please, inform patient that all labs are ok except for elevated sugar and a gout test. Cut back on carbs. Do labs before your next appointment please. Thx!

## 2013-09-13 NOTE — Telephone Encounter (Signed)
Pt called requesting lab results. Please advise.  

## 2013-09-22 ENCOUNTER — Emergency Department (HOSPITAL_COMMUNITY)
Admission: EM | Admit: 2013-09-22 | Discharge: 2013-09-22 | Disposition: A | Discharge status: Home / Self Care | Payer: Medicare HMO | Attending: Emergency Medicine | Admitting: Emergency Medicine

## 2013-09-22 ENCOUNTER — Encounter (HOSPITAL_COMMUNITY): Payer: Self-pay | Admitting: Emergency Medicine

## 2013-09-22 DIAGNOSIS — Z791 Long term (current) use of non-steroidal anti-inflammatories (NSAID): Secondary | ICD-10-CM | POA: Insufficient documentation

## 2013-09-22 DIAGNOSIS — Z8673 Personal history of transient ischemic attack (TIA), and cerebral infarction without residual deficits: Secondary | ICD-10-CM | POA: Insufficient documentation

## 2013-09-22 DIAGNOSIS — E119 Type 2 diabetes mellitus without complications: Secondary | ICD-10-CM | POA: Insufficient documentation

## 2013-09-22 DIAGNOSIS — M76892 Other specified enthesopathies of left lower limb, excluding foot: Secondary | ICD-10-CM

## 2013-09-22 DIAGNOSIS — F3289 Other specified depressive episodes: Secondary | ICD-10-CM | POA: Insufficient documentation

## 2013-09-22 DIAGNOSIS — Z87891 Personal history of nicotine dependence: Secondary | ICD-10-CM | POA: Insufficient documentation

## 2013-09-22 DIAGNOSIS — E039 Hypothyroidism, unspecified: Secondary | ICD-10-CM | POA: Insufficient documentation

## 2013-09-22 DIAGNOSIS — F329 Major depressive disorder, single episode, unspecified: Secondary | ICD-10-CM | POA: Insufficient documentation

## 2013-09-22 DIAGNOSIS — M109 Gout, unspecified: Secondary | ICD-10-CM | POA: Insufficient documentation

## 2013-09-22 DIAGNOSIS — Z79899 Other long term (current) drug therapy: Secondary | ICD-10-CM | POA: Insufficient documentation

## 2013-09-22 DIAGNOSIS — F411 Generalized anxiety disorder: Secondary | ICD-10-CM | POA: Insufficient documentation

## 2013-09-22 DIAGNOSIS — Z794 Long term (current) use of insulin: Secondary | ICD-10-CM | POA: Insufficient documentation

## 2013-09-22 DIAGNOSIS — I10 Essential (primary) hypertension: Secondary | ICD-10-CM | POA: Insufficient documentation

## 2013-09-22 DIAGNOSIS — M6580 Other synovitis and tenosynovitis, unspecified site: Secondary | ICD-10-CM | POA: Insufficient documentation

## 2013-09-22 DIAGNOSIS — Z7982 Long term (current) use of aspirin: Secondary | ICD-10-CM | POA: Insufficient documentation

## 2013-09-22 MED ORDER — OXYCODONE HCL 5 MG PO TABS: 5.0000 mg | ORAL_TABLET | ORAL | Status: DC | PRN

## 2013-09-22 MED ORDER — NAPROXEN 500 MG PO TABS
500.0000 mg | ORAL_TABLET | Freq: Once | ORAL | Status: AC
  Administered 2013-09-22: 500 mg via ORAL
  Filled 2013-09-22: qty 1

## 2013-09-22 MED ORDER — NAPROXEN 500 MG PO TABS: 500.0000 mg | ORAL_TABLET | Freq: Two times a day (BID) | ORAL | Status: DC

## 2013-09-22 MED ORDER — OXYCODONE-ACETAMINOPHEN 5-325 MG PO TABS
2.0000 | ORAL_TABLET | Freq: Once | ORAL | Status: AC
  Administered 2013-09-22: 2 via ORAL
  Filled 2013-09-22: qty 2

## 2013-09-22 NOTE — ED Provider Notes (Signed)
CSN: [id number]     Arrival date & time 09/22/13  0135 History   First MD Initiated Contact with Patient 09/22/13 0158     Chief Complaint  Patient presents with  . Hip Pain     (Consider location/radiation/quality/duration/timing/severity/associated sxs/prior Treatment) HPI 53 year old male presents to emergency room home with complaint of left hip pain.  Patient reports over the last 3-4 days he has had increasing left hip pain.  He reports that he has had pain in his hip ongoing for some time, but usually resolves within a day or 2.  Over the last 3 days he he reports that after taking 5-6 steps, the dull pain becomes very sharp and he is unable to stand up.  Patient recently had gout flare in left foot and ankle.  He denies any trauma to his left hip.  No weakness or numbness.  No bowel or bladder incontinence.  No back pain.  Patient has history of diabetes, hypertension, hypothyroidism CVA with memory loss, and gout. Past Medical History  Diagnosis Date  . Diabetes mellitus   . Hypertension   . Hypothyroidism   . Anxiety   . Depression   . Stroke     does not remember when  . Gout    Past Surgical History  Procedure Laterality Date  . Tonsillectomy     Family History  Problem Relation Age of Onset  . Coronary artery disease Mother   . Diabetes type II Father   . Hypertension Father    History  Substance Use Topics  . Smoking status: Former Smoker -- 5 years    Types: Cigarettes  . Smokeless tobacco: Never Used  . Alcohol Use: 10.8 oz/week    18 Cans of beer per week     Comment: Pt consumes beers occasionally    Review of Systems  See History of Present Illness; otherwise all other systems are reviewed and negative  Allergies  Shellfish allergy and Metformin and related  Home Medications   Prior to Admission medications   Medication Sig Start Date End Date Taking? Authorizing Provider  acetaminophen (TYLENOL) 500 MG tablet Take 1,500 mg by mouth every 8  (eight) hours as needed for mild pain.    Historical Provider, MD  amLODipine (NORVASC) 10 MG tablet TAKE 1 TABLET BY MOUTH DAILY FOR BLOOD PRESSURE 09/13/13   Evie Lacks Plotnikov, MD  aspirin 325 MG tablet Take 1 tablet (325 mg total) by mouth daily. For heart heart/stroke prevention 04/09/12   Encarnacion Slates, NP  cholecalciferol (VITAMIN D) 1000 UNITS tablet Take 1 tablet (1,000 Units total) by mouth daily. 09/08/13 09/08/14  Evie Lacks Plotnikov, MD  colchicine 0.6 MG tablet Take 1 tablet (0.6 mg total) by mouth 2 (two) times daily. For gout flare-ups 09/08/13   Cassandria Anger, MD  diclofenac (VOLTAREN) 75 MG EC tablet Take 1 tablet (75 mg total) by mouth 2 (two) times daily as needed (gout attack). 09/08/13   Evie Lacks Plotnikov, MD  furosemide (LASIX) 40 MG tablet Take 1 tablet (40 mg total) by mouth daily as needed for edema. 09/08/13   Evie Lacks Plotnikov, MD  glyBURIDE (DIABETA) 2.5 MG tablet TAKE 1 TABLET BY MOUTH TWICE DAILY 09/13/13   Cassandria Anger, MD  Insulin Aspart Prot & Aspart (NOVOLOG MIX 70/30 FLEXPEN) (70-30) 100 UNIT/ML Pen 24 units sq bid 09/08/13   Cassandria Anger, MD  levothyroxine (SYNTHROID, LEVOTHROID) 175 MCG tablet Take 1 tablet (175 mcg total) by mouth daily  before breakfast. 09/08/13   Cassandria Anger, MD  lisinopril (PRINIVIL,ZESTRIL) 20 MG tablet Take 1 tablet (20 mg total) by mouth daily. For high blood pressure control 08/05/13   Evie Lacks Plotnikov, MD  naproxen (NAPROSYN) 500 MG tablet Take 1 tablet (500 mg total) by mouth 2 (two) times daily. 09/22/13   Kalman Drape, MD  oxyCODONE (ROXICODONE) 5 MG immediate release tablet Take 1 tablet (5 mg total) by mouth every 4 (four) hours as needed for severe pain. Take 1-2 tab po q 4-6 hours prn pain 09/22/13   Kalman Drape, MD  potassium chloride SA (K-DUR,KLOR-CON) 20 MEQ tablet Take 2 tablets (40 mEq total) by mouth 4 (four) times daily. For potassium supplement 04/09/12   Encarnacion Slates, NP  traZODone (DESYREL) 100 MG tablet  Take 2 tablets (200 mg total) by mouth at bedtime. For sleep/depression 09/03/13   Cassandria Anger, MD   BP 150/81  Pulse 63  Temp(Src) 97.9 F (36.6 C) (Oral)  Resp 20  Ht 5\' 11"  (1.803 m)  Wt 305 lb (138.347 kg)  BMI 42.56 kg/m2  SpO2 94% Physical Exam  Nursing note and vitals reviewed. Constitutional: He is oriented to person, place, and time. He appears well-developed and well-nourished.  HENT:  Head: Normocephalic and atraumatic.  Right Ear: External ear normal.  Left Ear: External ear normal.  Nose: Nose normal.  Mouth/Throat: Oropharynx is clear and moist.  Eyes: Conjunctivae and EOM are normal. Pupils are equal, round, and reactive to light.  Neck: Normal range of motion. Neck supple. No JVD present. No tracheal deviation present. No thyromegaly present.  Cardiovascular: Normal rate, regular rhythm, normal heart sounds and intact distal pulses.  Exam reveals no gallop and no friction rub.   No murmur heard. Pulmonary/Chest: Effort normal and breath sounds normal. No stridor. No respiratory distress. He has no wheezes. He has no rales. He exhibits no tenderness.  Abdominal: Soft. Bowel sounds are normal. He exhibits no distension and no mass. There is no tenderness. There is no rebound and no guarding.  Musculoskeletal: Normal range of motion. He exhibits tenderness (patient has tenderness over the lateral left hip in the region of the tensor fascia lata and upper iliotibial tract). He exhibits no edema.  Negative straight leg raise.  No pain with flexion or extension at the hip.  Pain with rotation and abduction.  Lymphadenopathy:    He has no cervical adenopathy.  Neurological: He is alert and oriented to person, place, and time. He has normal reflexes. No cranial nerve deficit. He exhibits normal muscle tone. Coordination normal.  Skin: Skin is warm and dry. No rash noted. No erythema. No pallor.  Psychiatric: He has a normal mood and affect. His behavior is normal.  Judgment and thought content normal.    ED Course  Procedures (including critical care time) Labs Review Labs Reviewed - No data to display  Imaging Review No results found.   EKG Interpretation None      MDM   Final diagnoses:  Tendonitis of left hip    53 year old male with left hip pain.  Pain does not seem to arise from the hip joints, more from the lateral hip compartment, in the upper iliotibial tract and tensor fascia lata region.  Suspect tendinitis given his recent gout flare in his left leg, suspect abnormal use of the hip favoring his lower limb.  Patient has assistive device at home that he can use.  Plan for Naprosyn and Roxicodone  and close followup with orthopedics.   Kalman Drape, MD 09/22/13 [phone]

## 2013-09-22 NOTE — ED Notes (Signed)
Pt states he has had hip pain for quite some time but this time it is sharp in nature and makes him buckle down to his knees  Pt states he has fell 3 times in the last four days   Pt states he had a hip xray in April but has not heard anything from it  Pt states the pain is unbearable and makes his leg give out  Pt states pain is on the left

## 2013-09-22 NOTE — Discharge Instructions (Signed)
Please contact the orthopedic number listed above for further evaluation of your hip pain.  Take medications as prescribed.  Return to the ER for worsening pain, weakness, numbness, loss of bowel or bladder, or other new concerns.   Repetitive Strain Injuries Repetitive strain injuries (RSIs) result from overuse or misuse of soft tissues including muscles, tendons, or nerves. Tendons are the cord-like structures that attach muscles to bones. RSIs can affect almost any part of the body. However, RSIs are most common in the arms (thumbs, wrists, elbows, shoulders) and legs (ankles, knees). Common medical conditions that are often caused by repetitive strain include carpal tunnel syndrome, tennis or golfer's elbow, bursitis, and tendonitis. If RSIs are treated early, and therepeated activity is reduced or removed, the severity and length of your problems can usually be reduced. RSIs are also called cumulative trauma disorders (CTD).  CAUSES  Many RSIs occur due to repeating the same activity at work over weeks or months without sufficient rest, such as prolonged typing. RSIs also commonly occur when a hobby or sport is done repeatedly without sufficient rest. RSIs can also occur due to repeated strain or stress on a body part in someone who has one or more risk factors for RSIs. RISK FACTORS Workplace risk factors  Frequent computer use, especially if your workstation is not adjusted for your body type.  Infrequent rest breaks.  Working in a high-pressure environment.  Working at a [employer].  Repeating the same motion, such as frequent typing.  Working in an awkward position or holding the same position for a long time.  Forceful movements such as lifting, pulling, or pushing.  Vibration caused by using power tools.  Working in cold temperatures.  Job stress. Personal risk factors  Poor posture.  Being loose-jointed.  Not exercising regularly.  Being overweight.  Arthritis,  diabetes, thyroid problems, or other long-term (chronic)medical conditions.  Vitamin deficiencies.  Keeping your fingernails long.  An unhealthy, stressful, or inactive lifestyle.  Not sleeping well. SYMPTOMS  Symptoms often begin at work but become more noticeable after the repeated stress has ended. For example, you may develop fatigue or soreness in your wrist while typingat work, and at night you may develop numbness and tingling in your fingers. Common symptoms include:   Burning, shooting, or aching pain, especially in the fingers, palms, wrists, forearms, or shoulders.  Tenderness.  Swelling.  Tingling, numbness, or loss of feeling.  Pain with certain activities, such as turning a doorknob or reaching above your head.  Weakness, heaviness, or loss of coordination in yourhand.  Muscle spasms or tightness. In some cases, symptoms can become so intense that it is difficult to perform everyday tasks. Symptoms that do not improve with rest may indicate a more serious condition.  DIAGNOSIS  Your caregiver may determine the type ofRSI you have based on your medical evaluation and a description of your activities.  TREATMENT  Treatment depends on the severity and type of RSI you have. Your caregiver may recommend rest for the affected body part, medicines, and physical or occupational therapy to reduce pain, swelling, and soreness. Discuss the activities you do repeatedly with your caregiver. Your caregiver can help you decide whether you need to change your activities. An RSI may take months or years to heal, especially if the affected body part gets insufficient rest. In some cases, such as severe carpal tunnel syndrome, surgery may be recommended. PREVENTION  Talk with your supervisor to make sure you have the proper equipment for  your work station.  Maintain good posture at your desk or work station with:  Feet flat on the floor.  Knees directly over the feet, bent at a  right angle.  Lower back supported by your chair or a cushion in the curve of your lower back.  Shoulders and arms relaxed and at your sides.  Neck relaxed and not bent forwards or backwards.  Your desk and computer workstation properly adjusted to your body type.  Your chair adjusted so there is no excess pressure on the back of your thighs.  The keyboard resting above your thighs. You should be able to reach the keys with your elbows at your side, bent at a right angle. Your arms should be supported on forearm rests, with your forearms parallel to the ground.  The computer mouse within easy reach.  The monitor directly in front of you, so that your eyes are aligned with the top of the screen. The screen should be about 15 to 25 inches from your eyes.  While typing, keep your wrist straight, in a neutral position. Move your entire arm when you move your mouse or when typing hard-to-reach keys.  Only use your computer as much as you need to for work. Do not use it during breaks.  Take breaks often from any repeated activity. Alternate with another task which requires you to use different muscles, or rest at least once every hour.  Change positions regularly. If you spend a lot of time sitting, get up, walk around, and stretch.  Do not hold pens or pencils tightly when writing.  Exercise regularly.  Maintain a normal weight.  Eat a diet with plenty of vegetables, whole grains, and fruit.  Get sufficient, restful sleep. HOME CARE INSTRUCTIONS  If your caregiver prescribed medicine to help reduce swelling, take it as directed.  Only take over-the-counter or prescription medicines for pain, discomfort, or fever as directed by your caregiver.  Reduce, and if needed, stopthe activities that are causing your problems until you have no further symptoms.If your symptoms are work-related, you may need to talk to your supervisor about changing your activities.  When symptoms  develop, put ice or a cold pack on the aching area.  Put ice in a plastic bag.  Place a towel between your skin and the bag.  Leave the ice on for 15-20 minutes.  If you were given a splint to keep your wrist from bending, wear it as instructed. It is important to wear the splint at night. Use the splint for as long as your caregiver recommends. SEEK MEDICAL CARE IF:  You develop new problems.  Your problems do not get better with medicine. MAKE SURE YOU:  Understand these instructions.  Will watch your condition.  Will get help right away if you are not doing well or get worse. Document Released: 04/12/2002 Document Revised: 10/22/2011 Document Reviewed: 06/13/2011 [facility] Patient Information 2014 [place], [place].  Tendinitis Tendinitis is swelling and inflammation of the tendons. Tendons are band-like tissues that connect muscle to bone. Tendinitis commonly occurs in the:   Shoulders (rotator cuff).  Heels (Achilles tendon).  Elbows (triceps tendon). CAUSES Tendinitis is usually caused by overusing the tendon, muscles, and joints involved. When the tissue surrounding a tendon (synovium) becomes inflamed, it is called tenosynovitis. Tendinitis commonly develops in people whose jobs require repetitive motions. SYMPTOMS  Pain.  Tenderness.  Mild swelling. DIAGNOSIS Tendinitis is usually diagnosed by physical exam. Your caregiver may also order X-rays or other imaging  tests. TREATMENT Your caregiver may recommend certain medicines or exercises for your treatment. HOME CARE INSTRUCTIONS   Use a sling or splint for as long as directed by your caregiver until the pain decreases.  Put ice on the injured area.  Put ice in a plastic bag.  Place a towel between your skin and the bag.  Leave the ice on for 15-20 minutes, 03-04 times a day.  Avoid using the limb while the tendon is painful. Perform gentle range of motion exercises only as directed by your caregiver.  Stop exercises if pain or discomfort increase, unless directed otherwise by your caregiver.  Only take over-the-counter or prescription medicines for pain, discomfort, or fever as directed by your caregiver. SEEK MEDICAL CARE IF:   Your pain and swelling increase.  You develop new, unexplained symptoms, especially increased numbness in the hands. MAKE SURE YOU:   Understand these instructions.  Will watch your condition.  Will get help right away if you are not doing well or get worse. Document Released: 04/19/2000 Document Revised: 07/15/2011 Document Reviewed: 07/09/2010 [facility] Patient Information 2014 [place], [place].

## 2013-10-11 ENCOUNTER — Ambulatory Visit: Payer: Self-pay | Admitting: Internal Medicine

## 2013-10-25 ENCOUNTER — Ambulatory Visit: Payer: Self-pay | Admitting: Internal Medicine

## 2013-11-08 ENCOUNTER — Encounter: Payer: Self-pay | Admitting: Internal Medicine

## 2013-11-08 ENCOUNTER — Ambulatory Visit (INDEPENDENT_AMBULATORY_CARE_PROVIDER_SITE_OTHER): Payer: Managed Care, Other (non HMO) | Admitting: Internal Medicine

## 2013-11-08 VITALS — BP 158/100 | HR 76 | Temp 98.7°F | Resp 16 | Wt 301.0 lb

## 2013-11-08 DIAGNOSIS — E039 Hypothyroidism, unspecified: Secondary | ICD-10-CM

## 2013-11-08 DIAGNOSIS — IMO0002 Reserved for concepts with insufficient information to code with codable children: Secondary | ICD-10-CM

## 2013-11-08 DIAGNOSIS — E1165 Type 2 diabetes mellitus with hyperglycemia: Secondary | ICD-10-CM

## 2013-11-08 DIAGNOSIS — I63519 Cerebral infarction due to unspecified occlusion or stenosis of unspecified middle cerebral artery: Secondary | ICD-10-CM

## 2013-11-08 DIAGNOSIS — F1021 Alcohol dependence, in remission: Secondary | ICD-10-CM

## 2013-11-08 DIAGNOSIS — E118 Type 2 diabetes mellitus with unspecified complications: Secondary | ICD-10-CM

## 2013-11-08 DIAGNOSIS — I6350 Cerebral infarction due to unspecified occlusion or stenosis of unspecified cerebral artery: Secondary | ICD-10-CM

## 2013-11-08 DIAGNOSIS — I10 Essential (primary) hypertension: Secondary | ICD-10-CM

## 2013-11-08 DIAGNOSIS — F1410 Cocaine abuse, uncomplicated: Secondary | ICD-10-CM

## 2013-11-08 MED ORDER — LISINOPRIL 20 MG PO TABS: 20.0000 mg | ORAL_TABLET | Freq: Two times a day (BID) | ORAL | Status: DC

## 2013-11-08 NOTE — Assessment & Plan Note (Signed)
Dry since 2013

## 2013-11-08 NOTE — Assessment & Plan Note (Signed)
In remission since 2013 

## 2013-11-08 NOTE — Assessment & Plan Note (Signed)
Continue with current prescription therapy as reflected on the Med list.  

## 2013-11-08 NOTE — Assessment & Plan Note (Signed)
No relapse Continue with current prescription therapy as reflected on the Med list.  

## 2013-11-08 NOTE — Assessment & Plan Note (Signed)
Lisinopril 20 mg - increase to bid Norvasc Loose wt NAS diet

## 2013-11-08 NOTE — Progress Notes (Signed)
Patient ID: Phillip Turner, male   DOB: 1960-05-28, 53 y.o.   MRN: [id number]   Subjective:    HPI   C/o gout in R foot/ankle - Colcrys helped - better  C/o LBP, L hip pain  The patient presents for a follow-up of  chronic hypertension, CVA, chronic dyslipidemia, type 2 diabetes controlled with medicines  He is an alcoholic - dry since 3428 Denies drugs x 2 years  BP Readings from Last 3 Encounters:  11/08/13 158/100  09/22/13 150/81  09/08/13 170/90    Wt Readings from Last 3 Encounters:  11/08/13 301 lb (136.533 kg)  09/22/13 305 lb (138.347 kg)  09/08/13 305 lb (138.347 kg)    Review of Systems  Constitutional: Negative for appetite change, fatigue and unexpected weight change.  HENT: Negative for congestion, nosebleeds, sneezing, sore throat and trouble swallowing.   Eyes: Negative for itching and visual disturbance.  Respiratory: Negative for cough.   Cardiovascular: Negative for chest pain, palpitations and leg swelling.  Gastrointestinal: Negative for nausea, diarrhea, blood in stool and abdominal distention.  Genitourinary: Negative for frequency and hematuria.  Musculoskeletal: Positive for back pain. Negative for gait problem, joint swelling and neck pain.  Skin: Negative for rash.  Neurological: Negative for dizziness, tremors, speech difficulty and weakness.  Psychiatric/Behavioral: Negative for sleep disturbance, self-injury, dysphoric mood and agitation. The patient is not nervous/anxious.        Objective:   Physical Exam  Constitutional: He is oriented to person, place, and time. No distress.  Obese   Eyes: No scleral icterus.  Pulmonary/Chest: No stridor. He has no wheezes.  Musculoskeletal: He exhibits no edema (1+ B) and no tenderness (R ankle is NT).  Neurological: He is alert and oriented to person, place, and time. No cranial nerve deficit. He exhibits normal muscle tone. Coordination abnormal.  He is dysarthric  Psychiatric: Judgment normal.     Lab Results  Component Value Date   WBC 11.6* 09/08/2013   HGB 14.8 09/08/2013   HCT 44.3 09/08/2013   PLT 498.0* 09/08/2013   GLUCOSE 188* 09/08/2013   CHOL 230* 06/23/2013   TRIG 182.0* 06/23/2013   HDL 85.30 06/23/2013   LDLDIRECT 117.5 06/23/2013   ALT 42 09/08/2013   AST 30 09/08/2013   NA 138 09/08/2013   K 4.1 09/08/2013   CL 102 09/08/2013   CREATININE 1.1 09/08/2013   BUN 10 09/08/2013   CO2 27 09/08/2013   TSH 0.11* 09/08/2013   PSA 0.78 06/23/2013   HGBA1C 7.5* 09/08/2013   MICROALBUR 5.6* 09/08/2013        Assessment & Plan:

## 2013-11-09 ENCOUNTER — Telehealth: Payer: Self-pay | Admitting: Internal Medicine

## 2013-11-09 NOTE — Telephone Encounter (Signed)
Relevant patient education assigned to patient using Emmi. ° °

## 2013-12-03 ENCOUNTER — Other Ambulatory Visit: Payer: Self-pay | Admitting: Internal Medicine

## 2014-05-03 ENCOUNTER — Emergency Department (HOSPITAL_COMMUNITY)
Admission: EM | Admit: 2014-05-03 | Discharge: 2014-05-03 | Disposition: A | Discharge status: Home / Self Care | Payer: Commercial Managed Care - HMO | Attending: Emergency Medicine | Admitting: Emergency Medicine

## 2014-05-03 ENCOUNTER — Emergency Department (HOSPITAL_COMMUNITY): Payer: Commercial Managed Care - HMO

## 2014-05-03 ENCOUNTER — Encounter (HOSPITAL_COMMUNITY): Payer: Self-pay | Admitting: Emergency Medicine

## 2014-05-03 DIAGNOSIS — F419 Anxiety disorder, unspecified: Secondary | ICD-10-CM | POA: Insufficient documentation

## 2014-05-03 DIAGNOSIS — J159 Unspecified bacterial pneumonia: Secondary | ICD-10-CM | POA: Insufficient documentation

## 2014-05-03 DIAGNOSIS — E119 Type 2 diabetes mellitus without complications: Secondary | ICD-10-CM | POA: Diagnosis not present

## 2014-05-03 DIAGNOSIS — Z8673 Personal history of transient ischemic attack (TIA), and cerebral infarction without residual deficits: Secondary | ICD-10-CM | POA: Insufficient documentation

## 2014-05-03 DIAGNOSIS — I10 Essential (primary) hypertension: Secondary | ICD-10-CM | POA: Insufficient documentation

## 2014-05-03 DIAGNOSIS — Z87891 Personal history of nicotine dependence: Secondary | ICD-10-CM | POA: Insufficient documentation

## 2014-05-03 DIAGNOSIS — E039 Hypothyroidism, unspecified: Secondary | ICD-10-CM | POA: Insufficient documentation

## 2014-05-03 DIAGNOSIS — Z79899 Other long term (current) drug therapy: Secondary | ICD-10-CM | POA: Insufficient documentation

## 2014-05-03 DIAGNOSIS — Z794 Long term (current) use of insulin: Secondary | ICD-10-CM | POA: Diagnosis not present

## 2014-05-03 DIAGNOSIS — F329 Major depressive disorder, single episode, unspecified: Secondary | ICD-10-CM | POA: Insufficient documentation

## 2014-05-03 DIAGNOSIS — E876 Hypokalemia: Secondary | ICD-10-CM | POA: Insufficient documentation

## 2014-05-03 DIAGNOSIS — M109 Gout, unspecified: Secondary | ICD-10-CM | POA: Insufficient documentation

## 2014-05-03 DIAGNOSIS — Z7982 Long term (current) use of aspirin: Secondary | ICD-10-CM | POA: Diagnosis not present

## 2014-05-03 DIAGNOSIS — J189 Pneumonia, unspecified organism: Secondary | ICD-10-CM

## 2014-05-03 DIAGNOSIS — R05 Cough: Secondary | ICD-10-CM | POA: Diagnosis present

## 2014-05-03 LAB — COMPREHENSIVE METABOLIC PANEL
ALT: 39 U/L (ref 0–53)
AST: 43 U/L — ABNORMAL HIGH (ref 0–37)
Albumin: 4.2 g/dL (ref 3.5–5.2)
Alkaline Phosphatase: 80 U/L (ref 39–117)
Anion gap: 9 (ref 5–15)
BILIRUBIN TOTAL: 0.7 mg/dL (ref 0.3–1.2)
BUN: 8 mg/dL (ref 6–23)
CALCIUM: 8.7 mg/dL (ref 8.4–10.5)
CO2: 28 mmol/L (ref 19–32)
CREATININE: 0.98 mg/dL (ref 0.50–1.35)
Chloride: 106 mEq/L (ref 96–112)
GFR calc Af Amer: >90 mL/min (ref >90)
Glucose, Bld: 120 mg/dL — ABNORMAL HIGH (ref 70–99)
Potassium: 2.3 mmol/L — CL (ref 3.5–5.1)
Sodium: 143 mmol/L (ref 135–145)
Total Protein: 7.2 g/dL (ref 6.0–8.3)

## 2014-05-03 LAB — CBC
HEMATOCRIT: 40.4 % (ref 39.0–52.0)
Hemoglobin: 13.4 g/dL (ref 13.0–17.0)
MCH: 28.1 pg (ref 26.0–34.0)
MCHC: 33.2 g/dL (ref 30.0–36.0)
MCV: 84.7 fL (ref 78.0–100.0)
Platelets: 260 10*3/uL (ref 150–400)
RBC: 4.77 MIL/uL (ref 4.22–5.81)
RDW: 14.4 % (ref 11.5–15.5)
WBC: 7 10*3/uL (ref 4.0–10.5)

## 2014-05-03 LAB — BRAIN NATRIURETIC PEPTIDE: B NATRIURETIC PEPTIDE 5: 30.4 pg/mL (ref 0.0–100.0)

## 2014-05-03 LAB — I-STAT TROPONIN, ED: Troponin i, poc: 0.02 ng/mL (ref 0.00–0.08)

## 2014-05-03 MED ORDER — POTASSIUM CHLORIDE CRYS ER 20 MEQ PO TBCR: 40.0000 meq | EXTENDED_RELEASE_TABLET | Freq: Two times a day (BID) | ORAL | Status: DC

## 2014-05-03 MED ORDER — DEXAMETHASONE 6 MG PO TABS
10.0000 mg | ORAL_TABLET | Freq: Once | ORAL | Status: AC
  Administered 2014-05-03: 10 mg via ORAL
  Filled 2014-05-03: qty 1

## 2014-05-03 MED ORDER — LEVOFLOXACIN 750 MG PO TABS: 750.0000 mg | ORAL_TABLET | Freq: Every day | ORAL | Status: DC

## 2014-05-03 MED ORDER — BENZONATATE 100 MG PO CAPS: 100.0000 mg | ORAL_CAPSULE | Freq: Two times a day (BID) | ORAL | Status: DC | PRN

## 2014-05-03 MED ORDER — BENZONATATE 100 MG PO CAPS: 100.0000 mg | ORAL_CAPSULE | Freq: Three times a day (TID) | ORAL | Status: DC

## 2014-05-03 MED ORDER — BENZONATATE 100 MG PO CAPS
100.0000 mg | ORAL_CAPSULE | Freq: Once | ORAL | Status: AC
  Administered 2014-05-03: 100 mg via ORAL
  Filled 2014-05-03: qty 1

## 2014-05-03 MED ORDER — POTASSIUM CHLORIDE CRYS ER 20 MEQ PO TBCR
40.0000 meq | EXTENDED_RELEASE_TABLET | Freq: Once | ORAL | Status: AC
  Administered 2014-05-03: 40 meq via ORAL
  Filled 2014-05-03: qty 2

## 2014-05-03 MED ORDER — LEVOFLOXACIN 750 MG PO TABS
750.0000 mg | ORAL_TABLET | Freq: Once | ORAL | Status: AC
Start: 2014-05-03 — End: 2014-05-03
  Administered 2014-05-03: 750 mg via ORAL
  Filled 2014-05-03: qty 1

## 2014-05-03 NOTE — ED Notes (Signed)
Pt takes potassium supplements at home. Has not taken in week due to financial issues. Denies any tingling in legs which is his usual sign of low potassium. Pt ambulatory and denies any acute distress.

## 2014-05-03 NOTE — Discharge Instructions (Signed)
Hypokalemia Hypokalemia means that the amount of potassium in the blood is lower than normal.Potassium is a chemical, called an electrolyte, that helps regulate the amount of fluid in the body. It also stimulates muscle contraction and helps nerves function properly.Most of the body's potassium is inside of cells, and only a very small amount is in the blood. Because the amount in the blood is so small, minor changes can be life-threatening. CAUSES  Antibiotics.  Diarrhea or vomiting.  Using laxatives too much, which can cause diarrhea.  Chronic kidney disease.  Water pills (diuretics).  Eating disorders (bulimia).  Low magnesium level.  Sweating a lot. SIGNS AND SYMPTOMS  Weakness.  Constipation.  Fatigue.  Muscle cramps.  Mental confusion.  Skipped heartbeats or irregular heartbeat (palpitations).  Tingling or numbness. DIAGNOSIS  Your health care provider can diagnose hypokalemia with blood tests. In addition to checking your potassium level, your health care provider may also check other lab tests. TREATMENT Hypokalemia can be treated with potassium supplements taken by mouth or adjustments in your current medicines. If your potassium level is very low, you may need to get potassium through a vein (IV) and be monitored in the hospital. A diet high in potassium is also helpful. Foods high in potassium are:  Nuts, such as peanuts and pistachios.  Seeds, such as sunflower seeds and pumpkin seeds.  Peas, lentils, and lima beans.  Whole grain and bran cereals and breads.  Fresh fruit and vegetables, such as apricots, avocado, bananas, cantaloupe, kiwi, oranges, tomatoes, asparagus, and potatoes.  Orange and tomato juices.  Red meats.  Fruit yogurt. HOME CARE INSTRUCTIONS  Take all medicines as prescribed by your health care provider.  Maintain a healthy diet by including nutritious food, such as fruits, vegetables, nuts, whole grains, and lean meats.  If  you are taking a laxative, be sure to follow the directions on the label. SEEK MEDICAL CARE IF:  Your weakness gets worse.  You feel your heart pounding or racing.  You are vomiting or having diarrhea.  You are diabetic and having trouble keeping your blood glucose in the normal range. SEEK IMMEDIATE MEDICAL CARE IF:  You have chest pain, shortness of breath, or dizziness.  You are vomiting or having diarrhea for more than 2 days.  You faint. MAKE SURE YOU:   Understand these instructions.  Will watch your condition.  Will get help right away if you are not doing well or get worse. Document Released: 04/22/2005 Document Revised: 02/10/2013 Document Reviewed: 10/23/2012 [facility] Patient Information 2015 [place], [place]. This information is not intended to replace advice given to you by your health care provider. Make sure you discuss any questions you have with your health care provider.  Pneumonia Pneumonia is an infection of the lungs.  CAUSES Pneumonia may be caused by bacteria or a virus. Usually, these infections are caused by breathing infectious particles into the lungs (respiratory tract). SIGNS AND SYMPTOMS   Cough.  Fever.  Chest pain.  Increased rate of breathing.  Wheezing.  Mucus production. DIAGNOSIS  If you have the common symptoms of pneumonia, your health care provider will typically confirm the diagnosis with a chest X-ray. The X-ray will show an abnormality in the lung (pulmonary infiltrate) if you have pneumonia. Other tests of your blood, urine, or sputum may be done to find the specific cause of your pneumonia. Your health care provider may also do tests (blood gases or pulse oximetry) to see how well your lungs are working. TREATMENT  Some forms of pneumonia may be spread to other people when you cough or sneeze. You may be asked to wear a mask before and during your exam. Pneumonia that is caused by bacteria is treated with antibiotic medicine.  Pneumonia that is caused by the influenza virus may be treated with an antiviral medicine. Most other viral infections must run their course. These infections will not respond to antibiotics.  HOME CARE INSTRUCTIONS   Cough suppressants may be used if you are losing too much rest. However, coughing protects you by clearing your lungs. You should avoid using cough suppressants if you can.  Your health care provider may have prescribed medicine if he or she thinks your pneumonia is caused by bacteria or influenza. Finish your medicine even if you start to feel better.  Your health care provider may also prescribe an expectorant. This loosens the mucus to be coughed up.  Take medicines only as directed by your health care provider.  Do not smoke. Smoking is a common cause of bronchitis and can contribute to pneumonia. If you are a smoker and continue to smoke, your cough may last several weeks after your pneumonia has cleared.  A cold steam vaporizer or humidifier in your room or home may help loosen mucus.  Coughing is often worse at night. Sleeping in a semi-upright position in a recliner or using a couple pillows under your head will help with this.  Get rest as you feel it is needed. Your body will usually let you know when you need to rest. PREVENTION A pneumococcal shot (vaccine) is available to prevent a common bacterial cause of pneumonia. This is usually suggested for:  People over 42 years old.  Patients on chemotherapy.  People with chronic lung problems, such as bronchitis or emphysema.  People with immune system problems. If you are over 65 or have a high risk condition, you may receive the pneumococcal vaccine if you have not received it before. In some countries, a routine influenza vaccine is also recommended. This vaccine can help prevent some cases of pneumonia.You may be offered the influenza vaccine as part of your care. If you smoke, it is time to quit. You may receive  instructions on how to stop smoking. Your health care provider can provide medicines and counseling to help you quit. SEEK MEDICAL CARE IF: You have a fever. SEEK IMMEDIATE MEDICAL CARE IF:   Your illness becomes worse. This is especially true if you are elderly or weakened from any other disease.  You cannot control your cough with suppressants and are losing sleep.  You begin coughing up blood.  You develop pain which is getting worse or is uncontrolled with medicines.  Any of the symptoms which initially brought you in for treatment are getting worse rather than better.  You develop shortness of breath or chest pain. MAKE SURE YOU:   Understand these instructions.  Will watch your condition.  Will get help right away if you are not doing well or get worse. Document Released: 04/22/2005 Document Revised: 09/06/2013 Document Reviewed: 07/12/2010 [facility] Patient Information 2015 [place], [place]. This information is not intended to replace advice given to you by your health care provider. Make sure you discuss any questions you have with your health care provider.

## 2014-05-03 NOTE — ED Notes (Signed)
MD at bedside. 

## 2014-05-03 NOTE — ED Notes (Signed)
Pt c/o cough and SOB, c/o pain in chest, head and throat from coughing. Pt states this has been going on 4 days.

## 2014-05-03 NOTE — ED Provider Notes (Signed)
CSN: [id number]     Arrival date & time 05/03/14  1533 History   First MD Initiated Contact with Patient 05/03/14 1737     Chief Complaint  Patient presents with  . Cough  . Shortness of Breath     (Consider location/radiation/quality/duration/timing/severity/associated sxs/prior Treatment) Patient is a 53 y.o. male presenting with cough and shortness of breath. The history is provided by the patient.  Cough Cough characteristics:  Non-productive Severity:  Moderate Onset quality:  Gradual Duration:  4 days Timing:  Constant Progression:  Worsening Chronicity:  New Smoker: no   Context: sick contacts   Relieved by:  Nothing Ineffective treatments:  Beta-agonist inhaler Associated symptoms: shortness of breath and sore throat   Associated symptoms: no chest pain, no chills, no fever, no rash, no rhinorrhea and no wheezing   Shortness of Breath Associated symptoms: cough and sore throat   Associated symptoms: no abdominal pain, no chest pain, no fever, no rash, no vomiting and no wheezing     Past Medical History  Diagnosis Date  . Diabetes mellitus   . Hypertension   . Hypothyroidism   . Anxiety   . Depression   . Stroke     does not remember when  . Gout    Past Surgical History  Procedure Laterality Date  . Tonsillectomy     Family History  Problem Relation Age of Onset  . Coronary artery disease Mother   . Diabetes type II Father   . Hypertension Father    History  Substance Use Topics  . Smoking status: Former Smoker -- 5 years    Types: Cigarettes  . Smokeless tobacco: Never Used  . Alcohol Use: 10.8 oz/week    18 Cans of beer per week     Comment: Pt consumes beers occasionally    Review of Systems  Constitutional: Negative for fever and chills.  HENT: Positive for sore throat. Negative for rhinorrhea.   Respiratory: Positive for cough and shortness of breath. Negative for wheezing.   Cardiovascular: Negative for chest pain and leg swelling.   Gastrointestinal: Negative for vomiting and abdominal pain.  Skin: Negative for rash.  All other systems reviewed and are negative.     Allergies  Shellfish allergy; Metformin and related; and Naproxen  Home Medications   Prior to Admission medications   Medication Sig Start Date End Date Taking? Authorizing Provider  acetaminophen (TYLENOL) 500 MG tablet Take 1,500 mg by mouth every 8 (eight) hours as needed for mild pain.    Historical Provider, MD  amLODipine (NORVASC) 10 MG tablet TAKE 1 TABLET BY MOUTH EVERY DAY FOR BLOOD PRESSURE    Aleksei Plotnikov V, MD  aspirin 325 MG tablet Take 1 tablet (325 mg total) by mouth daily. For heart heart/stroke prevention 04/09/12   Encarnacion Slates, NP  cholecalciferol (VITAMIN D) 1000 UNITS tablet Take 1 tablet (1,000 Units total) by mouth daily. 09/08/13 09/08/14  Aleksei Plotnikov V, MD  colchicine 0.6 MG tablet Take 1 tablet (0.6 mg total) by mouth 2 (two) times daily. For gout flare-ups 09/08/13   Aleksei Plotnikov V, MD  diclofenac (VOLTAREN) 75 MG EC tablet Take 1 tablet (75 mg total) by mouth 2 (two) times daily as needed (gout attack). 09/08/13   Aleksei Plotnikov V, MD  furosemide (LASIX) 40 MG tablet Take 1 tablet (40 mg total) by mouth daily as needed for edema. 09/08/13   Aleksei Plotnikov V, MD  glyBURIDE (DIABETA) 2.5 MG tablet TAKE 1 TABLET BY  MOUTH TWICE DAILY 09/13/13   Cassandria Anger, MD  Insulin Aspart Prot & Aspart (NOVOLOG MIX 70/30 FLEXPEN) (70-30) 100 UNIT/ML Pen 24 units sq bid 09/08/13   Aleksei Plotnikov V, MD  levothyroxine (SYNTHROID, LEVOTHROID) 175 MCG tablet TAKE 1 TABLET BY MOUTH DAILY BEFORE BREAKFAST    Aleksei Plotnikov V, MD  lisinopril (PRINIVIL,ZESTRIL) 20 MG tablet Take 1 tablet (20 mg total) by mouth 2 (two) times daily. For high blood pressure control 11/08/13   Aleksei Plotnikov V, MD  oxyCODONE (ROXICODONE) 5 MG immediate release tablet Take 1 tablet (5 mg total) by mouth every 4 (four) hours as needed for severe pain.  Take 1-2 tab po q 4-6 hours prn pain 09/22/13   Kalman Drape, MD  potassium chloride (KLOR-CON) 20 MEQ packet USE 2 PACKETS BY MOUTH THREE TIMES DAILY    Aleksei Plotnikov V, MD  potassium chloride SA (K-DUR,KLOR-CON) 20 MEQ tablet Take 2 tablets (40 mEq total) by mouth 4 (four) times daily. For potassium supplement 04/09/12   Encarnacion Slates, NP  traZODone (DESYREL) 100 MG tablet TAKE 2 TABLETS BY MOUTH EVERY NIGHT AT BEDTIME FOR SLEEP/DEPRESSION    Aleksei Plotnikov V, MD   BP 172/71 mmHg  Pulse 73  Temp(Src) 98.6 F (37 C) (Oral)  Resp 24  SpO2 100% Physical Exam  Constitutional: He is oriented to person, place, and time. He appears well-developed and well-nourished. No distress.  HENT:  Head: Normocephalic and atraumatic.  Mouth/Throat: Oropharynx is clear and moist. No oropharyngeal exudate.  Eyes: EOM are normal. Pupils are equal, round, and reactive to light.  Neck: Normal range of motion. Neck supple.  Cardiovascular: Normal rate and regular rhythm.  Exam reveals no friction rub.   No murmur heard. Pulmonary/Chest: Effort normal and breath sounds normal. No respiratory distress. He has no wheezes. He has no rales.  Abdominal: Soft. He exhibits no distension. There is no tenderness. There is no rebound.  Musculoskeletal: Normal range of motion. He exhibits no edema.  Neurological: He is alert and oriented to person, place, and time. No cranial nerve deficit. He exhibits normal muscle tone. Coordination normal.  Skin: No rash noted. He is not diaphoretic.  Nursing note and vitals reviewed.   ED Course  Procedures (including critical care time) Labs Review Labs Reviewed  COMPREHENSIVE METABOLIC PANEL - Abnormal; Notable for the following:    Potassium 2.3 (*)    Glucose, Bld 120 (*)    AST 43 (*)    All other components within normal limits  BRAIN NATRIURETIC PEPTIDE  CBC  I-STAT TROPOININ, ED    Imaging Review Dg Chest 2 View  05/03/2014   CLINICAL DATA:  Cough and  shortness of breath. Headache. Chest tightness.  EXAM: CHEST  2 VIEW  COMPARISON:  12/11/2011  FINDINGS: There is faint area of infiltrate in the left midzone which probably represents pneumonia. There is peribronchial thickening bilaterally. Heart size and vascularity are normal. No effusions. No acute osseous abnormality.  IMPRESSION: Bronchitic changes with a faint infiltrate in the left midzone which probably represents pneumonia. Followup Chest x-ray is recommended to ensure clearing in 4 weeks.   Electronically Signed   By: Rozetta Nunnery M.D.   On: 05/03/2014 16:15     EKG Interpretation None      MDM   Final diagnoses:  Community acquired pneumonia  Hypokalemia    33M here with cough for past 4 days. Associated sore throat. Multiple sick contacts during the holidays. No fevers,  no productivity to the cough. No vomiting. Associated hoarseness. On exam, vitals stable. Lungs clear.  CXR shows L sided pneumonia. Labs show hypokalemia - 2.3. Hx of same, take potassium supplement, but has been out recent. States he doesn't feel low at this time, normally he has cramps when his potassium is low, no cramps today. Patient will get his prescriptions filled in 2 days once his disability check comes in tomorrow. Will give small amount of potassium supplement Rx to go home. 40 mEq given here.  I have reviewed all labs and imaging and considered them in my medical decision making.   Evelina Bucy, MD 05/03/14 [phone]

## 2014-05-13 DIAGNOSIS — F1020 Alcohol dependence, uncomplicated: Secondary | ICD-10-CM | POA: Diagnosis not present

## 2014-05-18 DIAGNOSIS — F1020 Alcohol dependence, uncomplicated: Secondary | ICD-10-CM | POA: Diagnosis not present

## 2014-05-20 DIAGNOSIS — F1020 Alcohol dependence, uncomplicated: Secondary | ICD-10-CM | POA: Diagnosis not present

## 2014-05-25 DIAGNOSIS — F1020 Alcohol dependence, uncomplicated: Secondary | ICD-10-CM | POA: Diagnosis not present

## 2014-05-30 DIAGNOSIS — F1020 Alcohol dependence, uncomplicated: Secondary | ICD-10-CM | POA: Diagnosis not present

## 2014-06-01 DIAGNOSIS — F1020 Alcohol dependence, uncomplicated: Secondary | ICD-10-CM | POA: Diagnosis not present

## 2014-06-03 DIAGNOSIS — F1020 Alcohol dependence, uncomplicated: Secondary | ICD-10-CM | POA: Diagnosis not present

## 2014-06-06 ENCOUNTER — Other Ambulatory Visit: Payer: Self-pay | Admitting: Internal Medicine

## 2014-06-06 DIAGNOSIS — F1020 Alcohol dependence, uncomplicated: Secondary | ICD-10-CM | POA: Diagnosis not present

## 2014-06-08 DIAGNOSIS — F1020 Alcohol dependence, uncomplicated: Secondary | ICD-10-CM | POA: Diagnosis not present

## 2014-06-10 DIAGNOSIS — F1020 Alcohol dependence, uncomplicated: Secondary | ICD-10-CM | POA: Diagnosis not present

## 2014-06-13 DIAGNOSIS — F1020 Alcohol dependence, uncomplicated: Secondary | ICD-10-CM | POA: Diagnosis not present

## 2014-06-15 DIAGNOSIS — F1020 Alcohol dependence, uncomplicated: Secondary | ICD-10-CM | POA: Diagnosis not present

## 2014-06-22 DIAGNOSIS — F1020 Alcohol dependence, uncomplicated: Secondary | ICD-10-CM | POA: Diagnosis not present

## 2014-06-27 DIAGNOSIS — F1020 Alcohol dependence, uncomplicated: Secondary | ICD-10-CM | POA: Diagnosis not present

## 2014-06-27 DIAGNOSIS — R69 Illness, unspecified: Secondary | ICD-10-CM | POA: Diagnosis not present

## 2014-07-01 DIAGNOSIS — R69 Illness, unspecified: Secondary | ICD-10-CM | POA: Diagnosis not present

## 2014-07-01 DIAGNOSIS — F1020 Alcohol dependence, uncomplicated: Secondary | ICD-10-CM | POA: Diagnosis not present

## 2014-07-04 DIAGNOSIS — F1020 Alcohol dependence, uncomplicated: Secondary | ICD-10-CM | POA: Diagnosis not present

## 2014-07-04 DIAGNOSIS — R69 Illness, unspecified: Secondary | ICD-10-CM | POA: Diagnosis not present

## 2014-07-05 ENCOUNTER — Other Ambulatory Visit: Payer: Self-pay | Admitting: Internal Medicine

## 2014-07-06 DIAGNOSIS — R69 Illness, unspecified: Secondary | ICD-10-CM | POA: Diagnosis not present

## 2014-07-06 DIAGNOSIS — F1020 Alcohol dependence, uncomplicated: Secondary | ICD-10-CM | POA: Diagnosis not present

## 2014-07-08 DIAGNOSIS — F1020 Alcohol dependence, uncomplicated: Secondary | ICD-10-CM | POA: Diagnosis not present

## 2014-07-08 DIAGNOSIS — R69 Illness, unspecified: Secondary | ICD-10-CM | POA: Diagnosis not present

## 2014-07-11 DIAGNOSIS — I10 Essential (primary) hypertension: Secondary | ICD-10-CM | POA: Diagnosis not present

## 2014-07-11 DIAGNOSIS — E876 Hypokalemia: Secondary | ICD-10-CM | POA: Diagnosis not present

## 2014-07-11 DIAGNOSIS — F1020 Alcohol dependence, uncomplicated: Secondary | ICD-10-CM | POA: Diagnosis not present

## 2014-07-11 DIAGNOSIS — E039 Hypothyroidism, unspecified: Secondary | ICD-10-CM | POA: Diagnosis not present

## 2014-07-11 DIAGNOSIS — E1165 Type 2 diabetes mellitus with hyperglycemia: Secondary | ICD-10-CM | POA: Diagnosis not present

## 2014-07-11 DIAGNOSIS — Z1211 Encounter for screening for malignant neoplasm of colon: Secondary | ICD-10-CM | POA: Diagnosis not present

## 2014-07-11 DIAGNOSIS — M1A9XX0 Chronic gout, unspecified, without tophus (tophi): Secondary | ICD-10-CM | POA: Diagnosis not present

## 2014-07-11 DIAGNOSIS — F1420 Cocaine dependence, uncomplicated: Secondary | ICD-10-CM | POA: Diagnosis not present

## 2014-07-11 DIAGNOSIS — N528 Other male erectile dysfunction: Secondary | ICD-10-CM | POA: Diagnosis not present

## 2014-07-11 DIAGNOSIS — M1A09X0 Idiopathic chronic gout, multiple sites, without tophus (tophi): Secondary | ICD-10-CM | POA: Diagnosis not present

## 2014-07-12 ENCOUNTER — Telehealth: Payer: Self-pay | Admitting: *Deleted

## 2014-07-12 NOTE — Telephone Encounter (Signed)
Rec fax stating pt's potassium chloride 20 meq powder packets cost over $300 with insurance. Ok to take tablets? If yes, please advise sig/quantity.

## 2014-07-13 DIAGNOSIS — R69 Illness, unspecified: Secondary | ICD-10-CM | POA: Diagnosis not present

## 2014-07-13 DIAGNOSIS — F1020 Alcohol dependence, uncomplicated: Secondary | ICD-10-CM | POA: Diagnosis not present

## 2014-07-13 MED ORDER — POTASSIUM CHLORIDE CRYS ER 20 MEQ PO TBCR: 40.0000 meq | EXTENDED_RELEASE_TABLET | Freq: Two times a day (BID) | ORAL | Status: DC

## 2014-07-13 NOTE — Telephone Encounter (Signed)
Ok tablets Thx

## 2014-07-13 NOTE — Telephone Encounter (Signed)
See meds 

## 2014-07-15 DIAGNOSIS — F1020 Alcohol dependence, uncomplicated: Secondary | ICD-10-CM | POA: Diagnosis not present

## 2014-07-15 DIAGNOSIS — R69 Illness, unspecified: Secondary | ICD-10-CM | POA: Diagnosis not present

## 2014-07-18 DIAGNOSIS — F1020 Alcohol dependence, uncomplicated: Secondary | ICD-10-CM | POA: Diagnosis not present

## 2014-07-18 DIAGNOSIS — R69 Illness, unspecified: Secondary | ICD-10-CM | POA: Diagnosis not present

## 2014-07-20 DIAGNOSIS — F1020 Alcohol dependence, uncomplicated: Secondary | ICD-10-CM | POA: Diagnosis not present

## 2014-07-20 DIAGNOSIS — R69 Illness, unspecified: Secondary | ICD-10-CM | POA: Diagnosis not present

## 2014-07-29 DIAGNOSIS — F1020 Alcohol dependence, uncomplicated: Secondary | ICD-10-CM | POA: Diagnosis not present

## 2014-08-01 DIAGNOSIS — F1020 Alcohol dependence, uncomplicated: Secondary | ICD-10-CM | POA: Diagnosis not present

## 2014-08-03 DIAGNOSIS — F1020 Alcohol dependence, uncomplicated: Secondary | ICD-10-CM | POA: Diagnosis not present

## 2014-08-05 DIAGNOSIS — F1020 Alcohol dependence, uncomplicated: Secondary | ICD-10-CM | POA: Diagnosis not present

## 2014-08-08 DIAGNOSIS — F1020 Alcohol dependence, uncomplicated: Secondary | ICD-10-CM | POA: Diagnosis not present

## 2014-08-10 DIAGNOSIS — F1020 Alcohol dependence, uncomplicated: Secondary | ICD-10-CM | POA: Diagnosis not present

## 2014-08-10 DIAGNOSIS — E1165 Type 2 diabetes mellitus with hyperglycemia: Secondary | ICD-10-CM | POA: Diagnosis not present

## 2014-08-10 DIAGNOSIS — I6789 Other cerebrovascular disease: Secondary | ICD-10-CM | POA: Diagnosis not present

## 2014-08-10 DIAGNOSIS — E039 Hypothyroidism, unspecified: Secondary | ICD-10-CM | POA: Diagnosis not present

## 2014-08-10 DIAGNOSIS — I10 Essential (primary) hypertension: Secondary | ICD-10-CM | POA: Diagnosis not present

## 2014-08-10 DIAGNOSIS — M7060 Trochanteric bursitis, unspecified hip: Secondary | ICD-10-CM | POA: Diagnosis not present

## 2014-08-11 ENCOUNTER — Ambulatory Visit (AMBULATORY_SURGERY_CENTER): Payer: Self-pay | Admitting: *Deleted

## 2014-08-11 VITALS — Ht 70.0 in | Wt 286.0 lb

## 2014-08-11 DIAGNOSIS — Z1211 Encounter for screening for malignant neoplasm of colon: Secondary | ICD-10-CM

## 2014-08-11 NOTE — Progress Notes (Signed)
No egg or soy allergy  Pt doesn't think he's ever been intubated- no troubles moving neck  No diet medications taken  Registered in [facility]

## 2014-08-12 DIAGNOSIS — F1020 Alcohol dependence, uncomplicated: Secondary | ICD-10-CM | POA: Diagnosis not present

## 2014-08-15 DIAGNOSIS — F1020 Alcohol dependence, uncomplicated: Secondary | ICD-10-CM | POA: Diagnosis not present

## 2014-08-16 ENCOUNTER — Encounter: Payer: Self-pay | Admitting: Internal Medicine

## 2014-08-25 ENCOUNTER — Encounter: Payer: Self-pay | Admitting: Internal Medicine

## 2014-08-25 ENCOUNTER — Ambulatory Visit (AMBULATORY_SURGERY_CENTER): Payer: Commercial Managed Care - HMO | Admitting: Internal Medicine

## 2014-08-25 VITALS — BP 159/97 | HR 50 | Temp 96.9°F | Resp 17 | Ht 70.0 in | Wt 301.0 lb

## 2014-08-25 DIAGNOSIS — K621 Rectal polyp: Secondary | ICD-10-CM

## 2014-08-25 DIAGNOSIS — D128 Benign neoplasm of rectum: Secondary | ICD-10-CM

## 2014-08-25 DIAGNOSIS — E119 Type 2 diabetes mellitus without complications: Secondary | ICD-10-CM | POA: Diagnosis not present

## 2014-08-25 DIAGNOSIS — Z1211 Encounter for screening for malignant neoplasm of colon: Secondary | ICD-10-CM | POA: Diagnosis present

## 2014-08-25 DIAGNOSIS — I10 Essential (primary) hypertension: Secondary | ICD-10-CM | POA: Diagnosis not present

## 2014-08-25 DIAGNOSIS — E039 Hypothyroidism, unspecified: Secondary | ICD-10-CM | POA: Diagnosis not present

## 2014-08-25 DIAGNOSIS — Z8673 Personal history of transient ischemic attack (TIA), and cerebral infarction without residual deficits: Secondary | ICD-10-CM | POA: Diagnosis not present

## 2014-08-25 DIAGNOSIS — F341 Dysthymic disorder: Secondary | ICD-10-CM | POA: Diagnosis not present

## 2014-08-25 MED ORDER — SODIUM CHLORIDE 0.9 % IV SOLN: 500.0000 mL | INTRAVENOUS | Status: DC

## 2014-08-25 NOTE — Patient Instructions (Addendum)
I found and removed one tiny rectal polyp. I will let you know pathology results and when to have another routine colonoscopy by mail.  You also have a condition called diverticulosis - common and not usually a problem. Please read the handout provided.  I appreciate the opportunity to care for you. Gatha Mayer, MD, FACG  YOU HAD AN ENDOSCOPIC PROCEDURE TODAY AT [facility]:   Refer to the procedure report that was given to you for any specific questions about what was found during the examination.  If the procedure report does not answer your questions, please call your gastroenterologist to clarify.  If you requested that your care partner not be given the details of your procedure findings, then the procedure report has been included in a sealed envelope for you to review at your convenience later.  YOU SHOULD EXPECT: Some feelings of bloating in the abdomen. Passage of more gas than usual.  Walking can help get rid of the air that was put into your GI tract during the procedure and reduce the bloating. If you had a lower endoscopy (such as a colonoscopy or flexible sigmoidoscopy) you may notice spotting of blood in your stool or on the toilet paper. If you underwent a bowel prep for your procedure, you may not have a normal bowel movement for a few days.  Please Note:  You might notice some irritation and congestion in your nose or some drainage.  This is from the oxygen used during your procedure.  There is no need for concern and it should clear up in a day or so.  SYMPTOMS TO REPORT IMMEDIATELY:   Following lower endoscopy (colonoscopy or flexible sigmoidoscopy):  Excessive amounts of blood in the stool  Significant tenderness or worsening of abdominal pains  Swelling of the abdomen that is new, acute  Fever of 100F or higher   Following upper endoscopy (EGD)  Vomiting of blood or coffee ground material  New chest pain or pain under the shoulder  blades  Painful or persistently difficult swallowing  New shortness of breath  Fever of 100F or higher  Black, tarry-looking stools  For urgent or emergent issues, a gastroenterologist can be reached at any hour by calling [phone].   DIET: Your first meal following the procedure should be a small meal and then it is ok to progress to your normal diet. Heavy or fried foods are harder to digest and may make you feel nauseous or bloated.  Likewise, meals heavy in dairy and vegetables can increase bloating.  Drink plenty of fluids but you should avoid alcoholic beverages for 24 hours.  ACTIVITY:  You should plan to take it easy for the rest of today and you should NOT DRIVE or use heavy machinery until tomorrow (because of the sedation medicines used during the test).    FOLLOW UP: Our staff will call the number listed on your records the next business day following your procedure to check on you and address any questions or concerns that you may have regarding the information given to you following your procedure. If we do not reach you, we will leave a message.  However, if you are feeling well and you are not experiencing any problems, there is no need to return our call.  We will assume that you have returned to your regular daily activities without incident.  If any biopsies were taken you will be contacted by phone or by letter within the next 1-3 weeks.  Please call us at [phone] if you have not heard about the biopsies in 3 weeks.    SIGNATURES/CONFIDENTIALITY: You and/or your care partner have signed paperwork which will be entered into your electronic medical record.  These signatures attest to the fact that that the information above on your After Visit Summary has been reviewed and is understood.  Full responsibility of the confidentiality of this discharge information lies with you and/or your care-partner.  Polyp and diverticulosis information given.

## 2014-08-25 NOTE — Progress Notes (Signed)
Report to PACU, RN, vss, BBS= Clear.  

## 2014-08-25 NOTE — Progress Notes (Signed)
Called to room to assist during endoscopic procedure.  Patient ID and intended procedure confirmed with present staff. Received instructions for my participation in the procedure from the performing physician.  

## 2014-08-25 NOTE — Op Note (Signed)
[place]  [address] [place], [id number]   COLONOSCOPY PROCEDURE REPORT  PATIENT: Phillip Turner, Phillip Turner  MR#: [id number] BIRTHDATE: 1960/05/11 , 53  yrs. old GENDER: male ENDOSCOPIST: Gatha Mayer, MD, [facility] PROCEDURE DATE:  08/25/2014 PROCEDURE:   Colonoscopy with biopsy First Screening Colonoscopy - Avg.  risk and is 50 yrs.  old or older Yes.  Prior Negative Screening - Now for repeat screening. N/A  History of Adenoma - Now for follow-up colonoscopy & has been > or = to 3 yrs.  N/A ASA CLASS:   Class III INDICATIONS:Screening for colonic neoplasia and Colorectal Neoplasm Risk Assessment for this procedure is average risk. MEDICATIONS: Propofol 400 mg IV and Monitored anesthesia care  DESCRIPTION OF PROCEDURE:   After the risks benefits and alternatives of the procedure were thoroughly explained, informed consent was obtained.  The digital rectal exam revealed no abnormalities of the rectum, revealed no prostatic nodules, and revealed the prostate was not enlarged.   The LB [id number] endoscope was introduced through the anus and advanced to the cecum, which was identified by both the appendix and ileocecal valve. No adverse events experienced.   The quality of the prep was excellent.  (MiraLax was used)  The instrument was then slowly withdrawn as the colon was fully examined.      COLON FINDINGS: A sessile polyp measuring 2 mm in size was found in the rectum.  A polypectomy was performed with cold forceps.  The resection was complete, the polyp tissue was completely retrieved and sent to histology.   There was mild diverticulosis noted in the sigmoid colon.   The examination was otherwise normal.  Retroflexed views revealed no abnormalities. The time to cecum = 2.5 Withdrawal time = 11.2   The scope was withdrawn and the procedure completed. COMPLICATIONS: There were no immediate complications.  ENDOSCOPIC IMPRESSION: 1.   Sessile polyp was  found in the rectum; polypectomy was performed with cold forceps 2.   There was mild diverticulosis noted in the sigmoid colon 3.   The examination was otherwise normal - excellent prep - first colonoscopy  RECOMMENDATIONS: Timing of repeat colonoscopy will be determined by pathology findings.  eSigned:  Gatha Mayer, MD, [facility] 08/25/2014 9:26 AM   cc: Nolene Ebbs, MD   The Patient

## 2014-08-26 ENCOUNTER — Telehealth: Payer: Self-pay | Admitting: *Deleted

## 2014-08-26 DIAGNOSIS — F1020 Alcohol dependence, uncomplicated: Secondary | ICD-10-CM | POA: Diagnosis not present

## 2014-08-26 DIAGNOSIS — Z79899 Other long term (current) drug therapy: Secondary | ICD-10-CM | POA: Diagnosis not present

## 2014-08-26 NOTE — Telephone Encounter (Signed)
  Follow up Call-  Call back number 08/25/2014  Post procedure Call Back phone  # [phone]  Permission to leave phone message Yes    Spoke with wife Patient questions:  Do you have a fever, pain , or abdominal swelling? No. Pain Score  0 *  Have you tolerated food without any problems? Yes.    Have you been able to return to your normal activities? Yes.    Do you have any questions about your discharge instructions: Diet   No. Medications  No. Follow up visit  No.  Do you have questions or concerns about your Care? No.  Actions: * If pain score is 4 or above: No action needed, pain <4.

## 2014-09-04 ENCOUNTER — Encounter: Payer: Self-pay | Admitting: Internal Medicine

## 2014-09-04 NOTE — Progress Notes (Signed)
Quick Note:  Benign prolapse polyp Repeat colon 2026 ______

## 2014-09-09 ENCOUNTER — Other Ambulatory Visit: Payer: Self-pay | Admitting: Internal Medicine

## 2014-11-04 ENCOUNTER — Other Ambulatory Visit: Payer: Self-pay | Admitting: Internal Medicine

## 2014-11-10 ENCOUNTER — Encounter (HOSPITAL_COMMUNITY): Payer: Self-pay

## 2014-11-10 ENCOUNTER — Emergency Department (HOSPITAL_COMMUNITY): Payer: Commercial Managed Care - HMO

## 2014-11-10 ENCOUNTER — Emergency Department (HOSPITAL_COMMUNITY)
Admission: EM | Admit: 2014-11-10 | Discharge: 2014-11-10 | Disposition: A | Discharge status: Home / Self Care | Payer: Commercial Managed Care - HMO | Attending: Emergency Medicine | Admitting: Emergency Medicine

## 2014-11-10 DIAGNOSIS — M1611 Unilateral primary osteoarthritis, right hip: Secondary | ICD-10-CM | POA: Diagnosis not present

## 2014-11-10 DIAGNOSIS — M109 Gout, unspecified: Secondary | ICD-10-CM | POA: Insufficient documentation

## 2014-11-10 DIAGNOSIS — Z87891 Personal history of nicotine dependence: Secondary | ICD-10-CM | POA: Insufficient documentation

## 2014-11-10 DIAGNOSIS — E785 Hyperlipidemia, unspecified: Secondary | ICD-10-CM | POA: Diagnosis not present

## 2014-11-10 DIAGNOSIS — Z79899 Other long term (current) drug therapy: Secondary | ICD-10-CM | POA: Diagnosis not present

## 2014-11-10 DIAGNOSIS — M25551 Pain in right hip: Secondary | ICD-10-CM | POA: Diagnosis present

## 2014-11-10 DIAGNOSIS — Z7982 Long term (current) use of aspirin: Secondary | ICD-10-CM | POA: Insufficient documentation

## 2014-11-10 DIAGNOSIS — F329 Major depressive disorder, single episode, unspecified: Secondary | ICD-10-CM | POA: Diagnosis not present

## 2014-11-10 DIAGNOSIS — F419 Anxiety disorder, unspecified: Secondary | ICD-10-CM | POA: Insufficient documentation

## 2014-11-10 DIAGNOSIS — I10 Essential (primary) hypertension: Secondary | ICD-10-CM | POA: Insufficient documentation

## 2014-11-10 DIAGNOSIS — E039 Hypothyroidism, unspecified: Secondary | ICD-10-CM | POA: Diagnosis not present

## 2014-11-10 DIAGNOSIS — M169 Osteoarthritis of hip, unspecified: Secondary | ICD-10-CM | POA: Diagnosis not present

## 2014-11-10 DIAGNOSIS — Z862 Personal history of diseases of the blood and blood-forming organs and certain disorders involving the immune mechanism: Secondary | ICD-10-CM | POA: Insufficient documentation

## 2014-11-10 DIAGNOSIS — E119 Type 2 diabetes mellitus without complications: Secondary | ICD-10-CM | POA: Diagnosis not present

## 2014-11-10 DIAGNOSIS — Z8673 Personal history of transient ischemic attack (TIA), and cerebral infarction without residual deficits: Secondary | ICD-10-CM | POA: Insufficient documentation

## 2014-11-10 MED ORDER — DIAZEPAM 5 MG/ML IJ SOLN
2.5000 mg | Freq: Once | INTRAMUSCULAR | Status: AC
  Administered 2014-11-10: 2.5 mg via INTRAMUSCULAR
  Filled 2014-11-10: qty 2

## 2014-11-10 MED ORDER — IBUPROFEN 600 MG PO TABS: 600.0000 mg | ORAL_TABLET | Freq: Four times a day (QID) | ORAL | Status: DC | PRN

## 2014-11-10 MED ORDER — HYDROMORPHONE HCL 1 MG/ML IJ SOLN
0.5000 mg | Freq: Once | INTRAMUSCULAR | Status: AC | PRN
  Administered 2014-11-10: 0.5 mg via INTRAMUSCULAR
  Filled 2014-11-10: qty 1

## 2014-11-10 MED ORDER — METHOCARBAMOL 500 MG PO TABS: 500.0000 mg | ORAL_TABLET | Freq: Two times a day (BID) | ORAL | Status: DC

## 2014-11-10 MED ORDER — HYDROMORPHONE HCL 1 MG/ML IJ SOLN
1.0000 mg | Freq: Once | INTRAMUSCULAR | Status: AC
  Administered 2014-11-10: 1 mg via INTRAMUSCULAR
  Filled 2014-11-10: qty 1

## 2014-11-10 MED ORDER — OXYCODONE-ACETAMINOPHEN 5-325 MG PO TABS: 1.0000 | ORAL_TABLET | Freq: Four times a day (QID) | ORAL | Status: DC | PRN

## 2014-11-10 NOTE — Discharge Instructions (Signed)
Arthritis, Nonspecific °Arthritis is inflammation of a joint. This usually means pain, redness, warmth or swelling are present. One or more joints may be involved. There are a number of types of arthritis. Your caregiver may not be able to tell what type of arthritis you have right away. °CAUSES  °The most common cause of arthritis is the wear and tear on the joint (osteoarthritis). This causes damage to the cartilage, which can break down over time. The knees, hips, back and neck are most often affected by this type of arthritis. °Other types of arthritis and common causes of joint pain include: °· Sprains and other injuries near the joint. Sometimes minor sprains and injuries cause pain and swelling that develop hours later. °· Rheumatoid arthritis. This affects hands, feet and knees. It usually affects both sides of your body at the same time. It is often associated with chronic ailments, fever, weight loss and general weakness. °· Crystal arthritis. Gout and pseudo gout can cause occasional acute severe pain, redness and swelling in the foot, ankle, or knee. °· Infectious arthritis. Bacteria can get into a joint through a break in overlying skin. This can cause infection of the joint. Bacteria and viruses can also spread through the blood and affect your joints. °· Drug, infectious and allergy reactions. Sometimes joints can become mildly painful and slightly swollen with these types of illnesses. °SYMPTOMS  °· Pain is the main symptom. °· Your joint or joints can also be red, swollen and warm or hot to the touch. °· You may have a fever with certain types of arthritis, or even feel overall ill. °· The joint with arthritis will hurt with movement. Stiffness is present with some types of arthritis. °DIAGNOSIS  °Your caregiver will suspect arthritis based on your description of your symptoms and on your exam. Testing may be needed to find the type of arthritis: °· Blood and sometimes urine tests. °· X-ray tests  and sometimes CT or MRI scans. °· Removal of fluid from the joint (arthrocentesis) is done to check for bacteria, crystals or other causes. Your caregiver (or a specialist) will numb the area over the joint with a local anesthetic, and use a needle to remove joint fluid for examination. This procedure is only minimally uncomfortable. °· Even with these tests, your caregiver may not be able to tell what kind of arthritis you have. Consultation with a specialist (rheumatologist) may be helpful. °TREATMENT  °Your caregiver will discuss with you treatment specific to your type of arthritis. If the specific type cannot be determined, then the following general recommendations may apply. °Treatment of severe joint pain includes: °· Rest. °· Elevation. °· Anti-inflammatory medication (for example, ibuprofen) may be prescribed. Avoiding activities that cause increased pain. °· Only take over-the-counter or prescription medicines for pain and discomfort as recommended by your caregiver. °· Cold packs over an inflamed joint may be used for 10 to 15 minutes every hour. Hot packs sometimes feel better, but do not use overnight. Do not use hot packs if you are diabetic without your caregiver's permission. °· A cortisone shot into arthritic joints may help reduce pain and swelling. °· Any acute arthritis that gets worse over the next 1 to 2 days needs to be looked at to be sure there is no joint infection. °Long-term arthritis treatment involves modifying activities and lifestyle to reduce joint stress jarring. This can include weight loss. Also, exercise is needed to nourish the joint cartilage and remove waste. This helps keep the muscles   around the joint strong. °HOME CARE INSTRUCTIONS  °· Do not take aspirin to relieve pain if gout is suspected. This elevates uric acid levels. °· Only take over-the-counter or prescription medicines for pain, discomfort or fever as directed by your caregiver. °· Rest the joint as much as  possible. °· If your joint is swollen, keep it elevated. °· Use crutches if the painful joint is in your leg. °· Drinking plenty of fluids may help for certain types of arthritis. °· Follow your caregiver's dietary instructions. °· Try low-impact exercise such as: °¨ Swimming. °¨ Water aerobics. °¨ Biking. °¨ Walking. °· Morning stiffness is often relieved by a warm shower. °· Put your joints through regular range-of-motion. °SEEK MEDICAL CARE IF:  °· You do not feel better in 24 hours or are getting worse. °· You have side effects to medications, or are not getting better with treatment. °SEEK IMMEDIATE MEDICAL CARE IF:  °· You have a fever. °· You develop severe joint pain, swelling or redness. °· Many joints are involved and become painful and swollen. °· There is severe back pain and/or leg weakness. °· You have loss of bowel or bladder control. °Document Released: 05/30/2004 Document Revised: 07/15/2011 Document Reviewed: 06/15/2008 °[employer]® Patient Information ©2015 [employer]. This information is not intended to replace advice given to you by your health care provider. Make sure you discuss any questions you have with your health care provider. ° °

## 2014-11-10 NOTE — ED Notes (Signed)
Patient transported to X-ray 

## 2014-11-10 NOTE — ED Provider Notes (Signed)
CSN: [id number]     Arrival date & time 11/10/14  1275 History   First MD Initiated Contact with Patient 11/10/14 [phone]     Chief Complaint  Patient presents with  . Hip Pain     (Consider location/radiation/quality/duration/timing/severity/associated sxs/prior Treatment) HPI Comments: 54 year old male with history of diabetes mellitus, hypertension, anxiety, depression, arthritis, dyslipidemia, sickle cell trait, and a history of cocaine abuse presents to the emergency department for further evaluation of hip pain. Patient reports that he began noticing that he was favoring his right hip 2 weeks ago. He felt as though he was "swinging to get momentum" on certain occasions. He states that he has a known history of left hip arthritis. He states that this usually bothers him, but he had no pain in his left hip after mowing the lawn yesterday. Patient finished his yardwork at noon time yesterday. He began noticing a cramping in his right hip shortly after. Pain would radiate from his right groin laterally and down his right leg. He states that pain worsened between noon and 2300 yesterday. He took Motrin 2 without relief. Pain has continued to worsen which prompted patient to seek evaluation in the emergency department. He denies any associated fever, extremity numbness, and bowel/bladder incontinence. Patient denies direct trauma or injury to his right hip.  Patient is a 54 y.o. male presenting with hip pain. The history is provided by the patient. No language interpreter was used.  Hip Pain Associated symptoms include arthralgias.    Past Medical History  Diagnosis Date  . Diabetes mellitus   . Hypertension   . Hypothyroidism   . Anxiety   . Depression   . Gout   . Arthritis   . Hyperlipidemia   . Rheumatic fever     as a teenager  . Stroke     does not remember when  . 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 Substance abuse     former cocaine user- not since March 2014   Past  Surgical History  Procedure Laterality Date  . Tonsillectomy    . Boil removed from tailbone      Family History  Problem Relation Age of Onset  . Coronary artery disease Mother   . Diabetes type II Father   . Hypertension Father   . Colon cancer Neg Hx   . Esophageal cancer Neg Hx   . Rectal cancer Neg Hx   . Stomach cancer Neg Hx    History  Substance Use Topics  . Smoking status: Former Smoker -- 5 years    Types: Cigarettes  . Smokeless tobacco: Never Used  . Alcohol Use: Yes     Comment: Pt consumes beers occasionally-2 beers weekly    Review of Systems  Musculoskeletal: Positive for arthralgias.  All other systems reviewed and are negative.   Allergies  Shellfish allergy; Clonidine derivatives; Metformin and related; and Naproxen  Home Medications   Prior to Admission medications   Medication Sig Start Date End Date Taking? Authorizing Provider  amLODipine (NORVASC) 10 MG tablet TAKE 1 TABLET BY MOUTH EVERY DAY FOR BLOOD PRESSURE 06/06/14  Yes Aleksei Plotnikov V, MD  aspirin 325 MG tablet Take 1 tablet (325 mg total) by mouth daily. For heart heart/stroke prevention Patient taking differently: Take 325 mg by mouth 2 (two) times daily. For heart heart/stroke prevention 04/09/12  Yes Encarnacion Slates, NP  colchicine 0.6 MG tablet TAKE 1 TABLET BY MOUTH TWICE DAILY FOR GOUT FLARE-UPS  09/12/14  Yes Aleksei Plotnikov V, MD  empagliflozin (JARDIANCE) 10 MG TABS tablet Take 10 mg by mouth daily.   Yes Historical Provider, MD  furosemide (LASIX) 40 MG tablet Take 1 tablet (40 mg total) by mouth daily as needed for edema. Patient taking differently: Take 40 mg by mouth daily as needed for fluid or edema.  09/08/13  Yes Aleksei Plotnikov V, MD  glipiZIDE (GLUCOTROL) 5 MG tablet Take by mouth 2 (two) times daily before a meal.   Yes Historical Provider, MD  hydrochlorothiazide (HYDRODIURIL) 25 MG tablet Take 25 mg by mouth daily.   Yes Historical Provider, MD  levothyroxine (SYNTHROID,  LEVOTHROID) 88 MCG tablet Take 88 mcg by mouth daily before breakfast.   Yes Historical Provider, MD  lisinopril (PRINIVIL,ZESTRIL) 20 MG tablet TAKE 1 TABLET BY MOUTH TWICE DAILY FOR HIGH BLOOD PRESSURE CONTROL 11/04/14  Yes Aleksei Plotnikov V, MD  potassium chloride SA (K-DUR,KLOR-CON) 20 MEQ tablet Take 2 tablets (40 mEq total) by mouth 2 (two) times daily. 07/13/14  Yes Aleksei Plotnikov V, MD  Sildenafil Citrate (VIAGRA PO) Take by mouth as needed.   Yes Historical Provider, MD  simvastatin (ZOCOR) 10 MG tablet Take 1 tablet by mouth daily. 11/04/14  Yes Historical Provider, MD  traZODone (DESYREL) 100 MG tablet TAKE 2 TABLETS BY MOUTH EVERY NIGHT AT BEDTIME FOR SLEEP/DEPRESSION 07/07/14  Yes Aleksei Plotnikov V, MD  ibuprofen (ADVIL,MOTRIN) 600 MG tablet Take 1 tablet (600 mg total) by mouth every 6 (six) hours as needed. 11/10/14   Antonietta Breach, PA-C  levothyroxine (SYNTHROID, LEVOTHROID) 175 MCG tablet TAKE 1 TABLET BY MOUTH DAILY BEFORE BREAKFAST Patient not taking: Reported on 11/10/2014    Lew Dawes V, MD  methocarbamol (ROBAXIN) 500 MG tablet Take 1 tablet (500 mg total) by mouth 2 (two) times daily. 11/10/14   Antonietta Breach, PA-C  oxyCODONE-acetaminophen (PERCOCET/ROXICET) 5-325 MG per tablet Take 1-2 tablets by mouth every 6 (six) hours as needed for severe pain. 11/10/14   Antonietta Breach, PA-C   BP 121/68 mmHg  Pulse 67  Temp(Src) 98.1 F (36.7 C)  Resp 18  Ht 5\' 10"  (1.778 m)  Wt 286 lb (129.729 kg)  BMI 41.04 kg/m2  SpO2 98%   Physical Exam  Constitutional: He is oriented to person, place, and time. He appears well-developed and well-nourished. No distress.  HENT:  Head: Normocephalic and atraumatic.  Eyes: Conjunctivae and EOM are normal. No scleral icterus.  Neck: Normal range of motion.  Cardiovascular: Normal rate, regular rhythm and intact distal pulses.   DP and PT pulses 2+ in the right lower extremity  Pulmonary/Chest: Effort normal. No respiratory distress.  Respirations  even and unlabored  Musculoskeletal: Normal range of motion.       Right hip: He exhibits tenderness. He exhibits normal range of motion, normal strength, no bony tenderness, no swelling and no deformity.       Legs: No right leg shortening or malrotation  Neurological: He is alert and oriented to person, place, and time. He exhibits normal muscle tone. Coordination normal.  GCS 15. Sensation to light touch intact in all extremities. Patient moves extremities without ataxia.  Skin: Skin is warm and dry. No rash noted. He is not diaphoretic. No erythema. No pallor.  Psychiatric: He has a normal mood and affect. His behavior is normal.  Nursing note and vitals reviewed.   ED Course  Procedures (including critical care time) Labs Review Labs Reviewed - No data to display  Imaging Review  Dg Hip Unilat With Pelvis 2-3 Views Right  11/10/2014   CLINICAL DATA:  Initial evaluation for acute right hip pain after hemo Ing grass history day. No trauma.  EXAM: RIGHT HIP (WITH PELVIS) 2-3 VIEWS  COMPARISON:  None.  FINDINGS: No acute fracture or dislocation. Femoral heads are normally position within the acetabula. Femoral head heights preserved. Femoral neck is intact. Visualized bony pelvis intact.  Severe degenerative osteoarthritic changes present about both hips, left worse than right. Degenerative facet arthrosis noted within the lower lumbar spine.  Osseous mineralization normal.  No soft tissue abnormality.  IMPRESSION: 1. No acute fracture or dislocation. 2. Moderate to severe osteoarthrosis about the hips bilaterally, left worse than right.   Electronically Signed   By: Jeannine Boga M.D.   On: 11/10/2014 05:28     EKG Interpretation None      MDM   Final diagnoses:  Localized osteoarthrosis of right hip    54 year old nontoxic-appearing male presents to the emergency department for further evaluation of right hip pain which began after 2 hours of mowing his lawn yesterday.  Symptoms worsening throughout the evening. Patient neurovascularly intact on exam. He is afebrile. No evidence of septic joint. No red flags or signs concerning for cauda equina. No history of falls, trauma, or other injury.  X-ray negative for acute fracture or dislocation. There is moderate to severe osteoarthrosis noted to bilateral hips. Suspect that symptoms are secondary to this. Patient also reports a cramping sensation which may be secondary to muscle spasm. Symptoms controlled in ED with Valium and Dilaudid. Patient able to ambulate in the emergency department. Given his reassuring physical examination, I do not place further emergent workup is indicated. Will manage symptoms further as outpatient. Primary care follow-up advised and return precautions given. Patient agreeable to plan with no unaddressed concerns. Patient discharged in good condition.   Filed Vitals:   11/10/14 0241  BP: 121/68  Pulse: 67  Temp: 98.1 F (36.7 C)  Resp: 18  Height: 5\' 10"  (1.778 m)  Weight: 286 lb (129.729 kg)  SpO2: 98%     Antonietta Breach, PA-C 11/10/14 0600  Julianne Rice, MD 11/10/14 [phone]

## 2014-11-10 NOTE — ED Notes (Signed)
Patient is alert and oriented x3.  He was given DC instructions and follow up visit instructions.  Patient gave verbal understanding.  He was DC ambulatory under his own power to home.  V/S stable.  He was not showing any signs of distress on DC 

## 2014-11-10 NOTE — ED Notes (Signed)
Patient requested pain medications.  Patient informed that he needs to see a doctor first

## 2014-11-10 NOTE — ED Notes (Signed)
Pt complains of right hip pain, no injury, he states that he noticed he was dragging the right leg two weeks ago and not lifting it all the way, last night he had a severe cramp on the right side from the middle thigh through the hip

## 2014-11-13 ENCOUNTER — Encounter (HOSPITAL_COMMUNITY): Payer: Self-pay | Admitting: Emergency Medicine

## 2014-11-13 ENCOUNTER — Emergency Department (HOSPITAL_COMMUNITY)
Admission: EM | Admit: 2014-11-13 | Discharge: 2014-11-13 | Disposition: A | Discharge status: Home / Self Care | Payer: Commercial Managed Care - HMO | Attending: Emergency Medicine | Admitting: Emergency Medicine

## 2014-11-13 DIAGNOSIS — F329 Major depressive disorder, single episode, unspecified: Secondary | ICD-10-CM | POA: Diagnosis not present

## 2014-11-13 DIAGNOSIS — M109 Gout, unspecified: Secondary | ICD-10-CM | POA: Diagnosis not present

## 2014-11-13 DIAGNOSIS — Z862 Personal history of diseases of the blood and blood-forming organs and certain disorders involving the immune mechanism: Secondary | ICD-10-CM | POA: Insufficient documentation

## 2014-11-13 DIAGNOSIS — Z8673 Personal history of transient ischemic attack (TIA), and cerebral infarction without residual deficits: Secondary | ICD-10-CM | POA: Diagnosis not present

## 2014-11-13 DIAGNOSIS — Z79899 Other long term (current) drug therapy: Secondary | ICD-10-CM | POA: Insufficient documentation

## 2014-11-13 DIAGNOSIS — Z7982 Long term (current) use of aspirin: Secondary | ICD-10-CM | POA: Diagnosis not present

## 2014-11-13 DIAGNOSIS — F419 Anxiety disorder, unspecified: Secondary | ICD-10-CM | POA: Insufficient documentation

## 2014-11-13 DIAGNOSIS — Z87891 Personal history of nicotine dependence: Secondary | ICD-10-CM | POA: Insufficient documentation

## 2014-11-13 DIAGNOSIS — E039 Hypothyroidism, unspecified: Secondary | ICD-10-CM | POA: Diagnosis not present

## 2014-11-13 DIAGNOSIS — M1990 Unspecified osteoarthritis, unspecified site: Secondary | ICD-10-CM | POA: Insufficient documentation

## 2014-11-13 DIAGNOSIS — M5416 Radiculopathy, lumbar region: Secondary | ICD-10-CM | POA: Insufficient documentation

## 2014-11-13 DIAGNOSIS — E785 Hyperlipidemia, unspecified: Secondary | ICD-10-CM | POA: Diagnosis not present

## 2014-11-13 DIAGNOSIS — I10 Essential (primary) hypertension: Secondary | ICD-10-CM | POA: Diagnosis not present

## 2014-11-13 DIAGNOSIS — E119 Type 2 diabetes mellitus without complications: Secondary | ICD-10-CM | POA: Insufficient documentation

## 2014-11-13 DIAGNOSIS — M25559 Pain in unspecified hip: Secondary | ICD-10-CM | POA: Diagnosis present

## 2014-11-13 MED ORDER — METHOCARBAMOL 500 MG PO TABS: 500.0000 mg | ORAL_TABLET | Freq: Two times a day (BID) | ORAL | Status: DC

## 2014-11-13 MED ORDER — OXYCODONE-ACETAMINOPHEN 5-325 MG PO TABS: 1.0000 | ORAL_TABLET | Freq: Four times a day (QID) | ORAL | Status: DC | PRN

## 2014-11-13 NOTE — ED Notes (Signed)
Pt c/o hip pain and right upper leg pain onset 11/10/14 at which time pt was seen and Dxed with osteoarthritis, pt states that pain has traveled lower into right lower anterior leg and pt is experiencing numbness in his right leg. Pt ambulatory with mild limp, no difficulty. Pt denies bowel or bladder incontinence.

## 2014-11-13 NOTE — Discharge Instructions (Signed)

## 2014-11-13 NOTE — ED Provider Notes (Signed)
CSN: [id number]     Arrival date & time 11/13/14  1001 History   First MD Initiated Contact with Patient 11/13/14 1007     Chief Complaint  Patient presents with  . Hip Pain  . Leg Pain     (Consider location/radiation/quality/duration/timing/severity/associated sxs/prior Treatment) HPI Comments: Pt comes in with c/o leg pain and numbness symptoms have seen going on for 5 days. He states that he was seen in the er 3 days ago and diagnosed with osteoarthritis of his hip. He states that the symptoms started after mowing the lawn. Denies redness, swelling or warmth to the area. He denies history of similar symptoms.  The history is provided by the patient. No language interpreter was used.    Past Medical History  Diagnosis Date  . Diabetes mellitus   . Hypertension   . Hypothyroidism   . Anxiety   . Depression   . Gout   . Arthritis   . Hyperlipidemia   . Rheumatic fever     as a teenager  . Stroke     does not remember when  . 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 Substance abuse     former cocaine user- not since March 2014   Past Surgical History  Procedure Laterality Date  . Tonsillectomy    . Boil removed from tailbone      Family History  Problem Relation Age of Onset  . Coronary artery disease Mother   . Diabetes type II Father   . Hypertension Father   . Colon cancer Neg Hx   . Esophageal cancer Neg Hx   . Rectal cancer Neg Hx   . Stomach cancer Neg Hx    History  Substance Use Topics  . Smoking status: Former Smoker -- 5 years    Types: Cigarettes  . Smokeless tobacco: Never Used  . Alcohol Use: Yes     Comment: Pt consumes beers occasionally-2 beers weekly    Review of Systems  All other systems reviewed and are negative.     Allergies  Shellfish allergy; Clonidine derivatives; Metformin and related; and Naproxen  Home Medications   Prior to Admission medications   Medication Sig Start Date End Date Taking? Authorizing Provider   amLODipine (NORVASC) 10 MG tablet TAKE 1 TABLET BY MOUTH EVERY DAY FOR BLOOD PRESSURE 06/06/14   Lew Dawes V, MD  aspirin 325 MG tablet Take 1 tablet (325 mg total) by mouth daily. For heart heart/stroke prevention Patient taking differently: Take 325 mg by mouth 2 (two) times daily. For heart heart/stroke prevention 04/09/12   Encarnacion Slates, NP  colchicine 0.6 MG tablet TAKE 1 TABLET BY MOUTH TWICE DAILY FOR GOUT FLARE-UPS 09/12/14   Lew Dawes V, MD  empagliflozin (JARDIANCE) 10 MG TABS tablet Take 10 mg by mouth daily.    Historical Provider, MD  furosemide (LASIX) 40 MG tablet Take 1 tablet (40 mg total) by mouth daily as needed for edema. Patient taking differently: Take 40 mg by mouth daily as needed for fluid or edema.  09/08/13   Aleksei Plotnikov V, MD  glipiZIDE (GLUCOTROL) 5 MG tablet Take by mouth 2 (two) times daily before a meal.    Historical Provider, MD  hydrochlorothiazide (HYDRODIURIL) 25 MG tablet Take 25 mg by mouth daily.    Historical Provider, MD  ibuprofen (ADVIL,MOTRIN) 600 MG tablet Take 1 tablet (600 mg total) by mouth every 6 (six) hours as needed. 11/10/14   Antonietta Breach,  PA-C  levothyroxine (SYNTHROID, LEVOTHROID) 175 MCG tablet TAKE 1 TABLET BY MOUTH DAILY BEFORE BREAKFAST Patient not taking: Reported on 11/10/2014    Aleksei Plotnikov V, MD  levothyroxine (SYNTHROID, LEVOTHROID) 88 MCG tablet Take 88 mcg by mouth daily before breakfast.    Historical Provider, MD  lisinopril (PRINIVIL,ZESTRIL) 20 MG tablet TAKE 1 TABLET BY MOUTH TWICE DAILY FOR HIGH BLOOD PRESSURE CONTROL 11/04/14   Lew Dawes V, MD  methocarbamol (ROBAXIN) 500 MG tablet Take 1 tablet (500 mg total) by mouth 2 (two) times daily. 11/10/14   Antonietta Breach, PA-C  methocarbamol (ROBAXIN) 500 MG tablet Take 1 tablet (500 mg total) by mouth 2 (two) times daily. 11/13/14   Glendell Docker, NP  oxyCODONE-acetaminophen (PERCOCET/ROXICET) 5-325 MG per tablet Take 1-2 tablets by mouth every 6 (six) hours  as needed for severe pain. 11/10/14   Antonietta Breach, PA-C  oxyCODONE-acetaminophen (PERCOCET/ROXICET) 5-325 MG per tablet Take 1-2 tablets by mouth every 6 (six) hours as needed for severe pain. 11/13/14   Glendell Docker, NP  potassium chloride SA (K-DUR,KLOR-CON) 20 MEQ tablet Take 2 tablets (40 mEq total) by mouth 2 (two) times daily. 07/13/14   Aleksei Plotnikov V, MD  Sildenafil Citrate (VIAGRA PO) Take by mouth as needed.    Historical Provider, MD  simvastatin (ZOCOR) 10 MG tablet Take 1 tablet by mouth daily. 11/04/14   Historical Provider, MD  traZODone (DESYREL) 100 MG tablet TAKE 2 TABLETS BY MOUTH EVERY NIGHT AT BEDTIME FOR SLEEP/DEPRESSION 07/07/14   Aleksei Plotnikov V, MD   BP 121/78 mmHg  Pulse 64  Temp(Src) 98 F (36.7 C) (Oral)  Resp 18  SpO2 98% Physical Exam  Constitutional: He is oriented to person, place, and time. He appears well-developed and well-nourished.  Cardiovascular: Normal rate and regular rhythm.   Pulmonary/Chest: Effort normal and breath sounds normal.  Musculoskeletal: Normal range of motion.  Normal rom of right hip. No swelling or deformity noted. Pt has good pulses and sensation to the area  Neurological: He is alert and oriented to person, place, and time. He exhibits normal muscle tone. Coordination normal.  Skin: Skin is dry.  Nursing note and vitals reviewed.   ED Course  Procedures (including critical care time) Labs Review Labs Reviewed - No data to display  Imaging Review No results found.   EKG Interpretation None      MDM   Final diagnoses:  Lumbar back pain with radiculopathy affecting left lower extremity    Will treat with oxycodone and methocarbamal. Pt is neurologically intact. Don't think imagining is needed at this time. Discussed with pt that he needs to follow up with ortho and neuro. Sound consistent with parathesia    Glendell Docker, NP 11/13/14 [place], MD 11/13/14 [phone]

## 2014-11-17 ENCOUNTER — Emergency Department (HOSPITAL_COMMUNITY)
Admission: EM | Admit: 2014-11-17 | Discharge: 2014-11-17 | Disposition: A | Discharge status: Home / Self Care | Payer: Commercial Managed Care - HMO | Attending: Emergency Medicine | Admitting: Emergency Medicine

## 2014-11-17 ENCOUNTER — Encounter (HOSPITAL_COMMUNITY): Payer: Self-pay | Admitting: Family Medicine

## 2014-11-17 DIAGNOSIS — F329 Major depressive disorder, single episode, unspecified: Secondary | ICD-10-CM | POA: Diagnosis not present

## 2014-11-17 DIAGNOSIS — Z79899 Other long term (current) drug therapy: Secondary | ICD-10-CM | POA: Insufficient documentation

## 2014-11-17 DIAGNOSIS — I10 Essential (primary) hypertension: Secondary | ICD-10-CM | POA: Diagnosis not present

## 2014-11-17 DIAGNOSIS — E119 Type 2 diabetes mellitus without complications: Secondary | ICD-10-CM | POA: Insufficient documentation

## 2014-11-17 DIAGNOSIS — M109 Gout, unspecified: Secondary | ICD-10-CM | POA: Diagnosis not present

## 2014-11-17 DIAGNOSIS — Z862 Personal history of diseases of the blood and blood-forming organs and certain disorders involving the immune mechanism: Secondary | ICD-10-CM | POA: Diagnosis not present

## 2014-11-17 DIAGNOSIS — Z8673 Personal history of transient ischemic attack (TIA), and cerebral infarction without residual deficits: Secondary | ICD-10-CM | POA: Diagnosis not present

## 2014-11-17 DIAGNOSIS — Z7982 Long term (current) use of aspirin: Secondary | ICD-10-CM | POA: Diagnosis not present

## 2014-11-17 DIAGNOSIS — M25551 Pain in right hip: Secondary | ICD-10-CM | POA: Insufficient documentation

## 2014-11-17 DIAGNOSIS — E785 Hyperlipidemia, unspecified: Secondary | ICD-10-CM | POA: Diagnosis not present

## 2014-11-17 DIAGNOSIS — E039 Hypothyroidism, unspecified: Secondary | ICD-10-CM | POA: Diagnosis not present

## 2014-11-17 DIAGNOSIS — F419 Anxiety disorder, unspecified: Secondary | ICD-10-CM | POA: Insufficient documentation

## 2014-11-17 DIAGNOSIS — Z87891 Personal history of nicotine dependence: Secondary | ICD-10-CM | POA: Insufficient documentation

## 2014-11-17 DIAGNOSIS — M1990 Unspecified osteoarthritis, unspecified site: Secondary | ICD-10-CM | POA: Diagnosis not present

## 2014-11-17 MED ORDER — HYDROCODONE-ACETAMINOPHEN 5-325 MG PO TABS
2.0000 | ORAL_TABLET | Freq: Once | ORAL | Status: AC
  Administered 2014-11-17: 2 via ORAL
  Filled 2014-11-17: qty 2

## 2014-11-17 MED ORDER — IBUPROFEN 800 MG PO TABS
800.0000 mg | ORAL_TABLET | Freq: Once | ORAL | Status: AC
  Administered 2014-11-17: 800 mg via ORAL
  Filled 2014-11-17: qty 1

## 2014-11-17 MED ORDER — KETOROLAC TROMETHAMINE 60 MG/2ML IM SOLN: 60.0000 mg | Freq: Once | INTRAMUSCULAR | Status: DC

## 2014-11-17 MED ORDER — HYDROCODONE-ACETAMINOPHEN 5-325 MG PO TABS: 2.0000 | ORAL_TABLET | Freq: Two times a day (BID) | ORAL | Status: DC | PRN

## 2014-11-17 MED ORDER — IBUPROFEN 800 MG PO TABS: 800.0000 mg | ORAL_TABLET | Freq: Three times a day (TID) | ORAL | Status: DC | PRN

## 2014-11-17 NOTE — ED Provider Notes (Addendum)
CSN: [id number]     Arrival date & time 11/17/14  0555 History   First MD Initiated Contact with Patient 11/17/14 [phone]     Chief Complaint  Patient presents with  . Hip Pain     (Consider location/radiation/quality/duration/timing/severity/associated sxs/prior Treatment) HPI  Phillip Turner is a 54 y.o. male with past medical history of hypertension, diabetes, hyperlipidemia presenting today with right-sided hip pain. Patient has been seen emergency department twice in the past 6 months for similar complaints. He states his pain is throbbing and sharp with radiation down to his ankle. He has been given Norco in the past with mild relief. He has follow-up with his primary care physician on the 20th and has not been able to get an earlier appointment. Patient is also taking a muscle relaxer daily. He states he has an allergy to NSAIDs. He denies any fevers, chills, urinary retention, or neurological complaints. Patient has no further complaints.  10 Systems reviewed and are negative for acute change except as noted in the HPI.    Past Medical History  Diagnosis Date  . Diabetes mellitus   . Hypertension   . Hypothyroidism   . Anxiety   . Depression   . Gout   . Arthritis   . Hyperlipidemia   . Rheumatic fever     as a teenager  . Stroke     does not remember when  . 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 Substance abuse     former cocaine user- not since March 2014   Past Surgical History  Procedure Laterality Date  . Tonsillectomy    . Boil removed from tailbone      Family History  Problem Relation Age of Onset  . Coronary artery disease Mother   . Diabetes type II Father   . Hypertension Father   . Colon cancer Neg Hx   . Esophageal cancer Neg Hx   . Rectal cancer Neg Hx   . Stomach cancer Neg Hx    History  Substance Use Topics  . Smoking status: Former Smoker -- 5 years    Types: Cigarettes  . Smokeless tobacco: Never Used  . Alcohol Use: Yes      Comment: Pt consumes beers occasionally-2 beers weekly    Review of Systems    Allergies  Shellfish allergy; Clonidine derivatives; Metformin and related; and Naproxen  Home Medications   Prior to Admission medications   Medication Sig Start Date End Date Taking? Authorizing Provider  amLODipine (NORVASC) 10 MG tablet TAKE 1 TABLET BY MOUTH EVERY DAY FOR BLOOD PRESSURE 06/06/14   Lew Dawes V, MD  aspirin 325 MG tablet Take 1 tablet (325 mg total) by mouth daily. For heart heart/stroke prevention Patient taking differently: Take 325 mg by mouth 2 (two) times daily. For heart heart/stroke prevention 04/09/12   Encarnacion Slates, NP  colchicine 0.6 MG tablet TAKE 1 TABLET BY MOUTH TWICE DAILY FOR GOUT FLARE-UPS 09/12/14   Lew Dawes V, MD  empagliflozin (JARDIANCE) 10 MG TABS tablet Take 10 mg by mouth daily.    Historical Provider, MD  furosemide (LASIX) 40 MG tablet Take 1 tablet (40 mg total) by mouth daily as needed for edema. Patient taking differently: Take 40 mg by mouth daily as needed for fluid or edema.  09/08/13   Aleksei Plotnikov V, MD  glipiZIDE (GLUCOTROL) 5 MG tablet Take by mouth 2 (two) times daily before a meal.    Historical Provider,  MD  hydrochlorothiazide (HYDRODIURIL) 25 MG tablet Take 25 mg by mouth daily.    Historical Provider, MD  ibuprofen (ADVIL,MOTRIN) 600 MG tablet Take 1 tablet (600 mg total) by mouth every 6 (six) hours as needed. 11/10/14   Antonietta Breach, PA-C  levothyroxine (SYNTHROID, LEVOTHROID) 175 MCG tablet TAKE 1 TABLET BY MOUTH DAILY BEFORE BREAKFAST Patient not taking: Reported on 11/10/2014    Aleksei Plotnikov V, MD  levothyroxine (SYNTHROID, LEVOTHROID) 88 MCG tablet Take 88 mcg by mouth daily before breakfast.    Historical Provider, MD  lisinopril (PRINIVIL,ZESTRIL) 20 MG tablet TAKE 1 TABLET BY MOUTH TWICE DAILY FOR HIGH BLOOD PRESSURE CONTROL 11/04/14   Lew Dawes V, MD  methocarbamol (ROBAXIN) 500 MG tablet Take 1 tablet (500 mg total)  by mouth 2 (two) times daily. 11/10/14   Antonietta Breach, PA-C  methocarbamol (ROBAXIN) 500 MG tablet Take 1 tablet (500 mg total) by mouth 2 (two) times daily. 11/13/14   Glendell Docker, NP  oxyCODONE-acetaminophen (PERCOCET/ROXICET) 5-325 MG per tablet Take 1-2 tablets by mouth every 6 (six) hours as needed for severe pain. 11/10/14   Antonietta Breach, PA-C  oxyCODONE-acetaminophen (PERCOCET/ROXICET) 5-325 MG per tablet Take 1-2 tablets by mouth every 6 (six) hours as needed for severe pain. 11/13/14   Glendell Docker, NP  potassium chloride SA (K-DUR,KLOR-CON) 20 MEQ tablet Take 2 tablets (40 mEq total) by mouth 2 (two) times daily. 07/13/14   Aleksei Plotnikov V, MD  Sildenafil Citrate (VIAGRA PO) Take by mouth as needed.    Historical Provider, MD  simvastatin (ZOCOR) 10 MG tablet Take 1 tablet by mouth daily. 11/04/14   Historical Provider, MD  traZODone (DESYREL) 100 MG tablet TAKE 2 TABLETS BY MOUTH EVERY NIGHT AT BEDTIME FOR SLEEP/DEPRESSION 07/07/14   Aleksei Plotnikov V, MD   BP 156/88 mmHg  Pulse 69  Temp(Src) 97.9 F (36.6 C) (Oral)  Resp 18  Ht 5\' 10"  (1.778 m)  Wt 286 lb (129.729 kg)  BMI 41.04 kg/m2  SpO2 99% Physical Exam  Constitutional: He is oriented to person, place, and time. Vital signs are normal. He appears well-developed and well-nourished.  Non-toxic appearance. He does not appear ill. No distress.  HENT:  Head: Normocephalic and atraumatic.  Nose: Nose normal.  Mouth/Throat: Oropharynx is clear and moist. No oropharyngeal exudate.  Eyes: Conjunctivae and EOM are normal. Pupils are equal, round, and reactive to light. No scleral icterus.  Neck: Normal range of motion. Neck supple. No tracheal deviation, no edema, no erythema and normal range of motion present. No thyroid mass and no thyromegaly present.  Cardiovascular: Normal rate, regular rhythm, S1 normal, S2 normal, normal heart sounds, intact distal pulses and normal pulses.  Exam reveals no gallop and no friction rub.   No  murmur heard. Pulses:      Radial pulses are 2+ on the right side, and 2+ on the left side.       Dorsalis pedis pulses are 2+ on the right side, and 2+ on the left side.  Pulmonary/Chest: Effort normal and breath sounds normal. No respiratory distress. He has no wheezes. He has no rhonchi. He has no rales.  Abdominal: Soft. Normal appearance and bowel sounds are normal. He exhibits no distension, no ascites and no mass. There is no hepatosplenomegaly. There is no tenderness. There is no rebound, no guarding and no CVA tenderness.  Musculoskeletal: Normal range of motion. He exhibits no edema or tenderness.  Lymphadenopathy:    He has no cervical adenopathy.  Neurological: He is alert and oriented to person, place, and time. He has normal strength. No cranial nerve deficit or sensory deficit.  Skin: Skin is warm, dry and intact. No petechiae and no rash noted. He is not diaphoretic. No erythema. No pallor.  Psychiatric: He has a normal mood and affect. His behavior is normal. Judgment normal.  Nursing note and vitals reviewed.   ED Course  Procedures (including critical care time) Labs Review Labs Reviewed - No data to display  Imaging Review No results found.   EKG Interpretation None      MDM   Final diagnoses:  None    Patient since emergency department for right-sided hip pain. Straight leg raise test bilaterally is negative. He has no tenderness to palpation. He had a previous x-rays showing osteoarthritis. This is a possibility versus sciatica. Patient will have Norco refilled advised to see his primary care physician within 3 days for close follow-up. He appears well in no acute distress. His vital signs remain within his normal limits and he is safe for discharge.  Patient has been taking Motrin at home without any problems. Despite his allergy to naproxen. Patient is advised to try Motrin as first line to Norco as second line.    Everlene Balls, MD 11/17/14  [id number]  Everlene Balls, MD 11/17/14 [phone]

## 2014-11-17 NOTE — Discharge Instructions (Signed)
Arthralgia Phillip Turner, take ibuprofen 800mg  first for your pain.  If your pain is severe then take norco as prescribed and see your primary care doctor within 3 days for close follow up.  If symptoms worsen come back to the emergency department immediately. Thank you. Arthralgia is joint pain. A joint is a place where two bones meet. Joint pain can happen for many reasons. The joint can be bruised, stiff, infected, or weak from aging. Pain usually goes away after resting and taking medicine for soreness.  HOME CARE  Rest the joint as told by your doctor.  Keep the sore joint raised (elevated) for the first 24 hours.  Put ice on the joint area.  Put ice in a plastic bag.  Place a towel between your skin and the bag.  Leave the ice on for 15-20 minutes, 03-04 times a day.  Wear your splint, casting, elastic bandage, or sling as told by your doctor.  Only take medicine as told by your doctor. Do not take aspirin.  Use crutches as told by your doctor. Do not put weight on the joint until told to by your doctor. GET HELP RIGHT AWAY IF:   You have bruising, puffiness (swelling), or more pain.  Your fingers or toes turn blue or start to lose feeling (numb).  Your medicine does not lessen the pain.  Your pain becomes severe.  You have a temperature by mouth above 102 F (38.9 C), not controlled by medicine.  You cannot move or use the joint. MAKE SURE YOU:   Understand these instructions.  Will watch your condition.  Will get help right away if you are not doing well or get worse. Document Released: 04/10/2009 Document Revised: 07/15/2011 Document Reviewed: 04/10/2009 [facility] Patient Information 2015 [place], [place]. This information is not intended to replace advice given to you by your health care provider. Make sure you discuss any questions you have with your health care provider.

## 2014-11-17 NOTE — ED Notes (Signed)
Patient is complaining of right hip, lower back, and extremity numbness. Pt reports being seen for the hip pain on Sunday.

## 2014-11-23 DIAGNOSIS — E039 Hypothyroidism, unspecified: Secondary | ICD-10-CM | POA: Diagnosis not present

## 2014-11-23 DIAGNOSIS — M1610 Unilateral primary osteoarthritis, unspecified hip: Secondary | ICD-10-CM | POA: Diagnosis not present

## 2014-11-23 DIAGNOSIS — G4730 Sleep apnea, unspecified: Secondary | ICD-10-CM | POA: Diagnosis not present

## 2014-11-23 DIAGNOSIS — E1165 Type 2 diabetes mellitus with hyperglycemia: Secondary | ICD-10-CM | POA: Diagnosis not present

## 2014-11-23 DIAGNOSIS — M545 Low back pain: Secondary | ICD-10-CM | POA: Diagnosis not present

## 2014-11-23 DIAGNOSIS — M5136 Other intervertebral disc degeneration, lumbar region: Secondary | ICD-10-CM | POA: Diagnosis not present

## 2014-11-23 DIAGNOSIS — I10 Essential (primary) hypertension: Secondary | ICD-10-CM | POA: Diagnosis not present

## 2014-11-23 DIAGNOSIS — K219 Gastro-esophageal reflux disease without esophagitis: Secondary | ICD-10-CM | POA: Diagnosis not present

## 2014-11-24 DIAGNOSIS — M160 Bilateral primary osteoarthritis of hip: Secondary | ICD-10-CM | POA: Diagnosis not present

## 2014-11-24 DIAGNOSIS — M5136 Other intervertebral disc degeneration, lumbar region: Secondary | ICD-10-CM | POA: Diagnosis not present

## 2014-12-01 ENCOUNTER — Emergency Department (HOSPITAL_COMMUNITY)
Admission: EM | Admit: 2014-12-01 | Discharge: 2014-12-01 | Disposition: A | Discharge status: Home / Self Care | Payer: Commercial Managed Care - HMO | Attending: Emergency Medicine | Admitting: Emergency Medicine

## 2014-12-01 ENCOUNTER — Encounter (HOSPITAL_COMMUNITY): Payer: Self-pay | Admitting: Emergency Medicine

## 2014-12-01 DIAGNOSIS — F419 Anxiety disorder, unspecified: Secondary | ICD-10-CM | POA: Insufficient documentation

## 2014-12-01 DIAGNOSIS — Z7982 Long term (current) use of aspirin: Secondary | ICD-10-CM | POA: Insufficient documentation

## 2014-12-01 DIAGNOSIS — Z79899 Other long term (current) drug therapy: Secondary | ICD-10-CM | POA: Insufficient documentation

## 2014-12-01 DIAGNOSIS — Z87891 Personal history of nicotine dependence: Secondary | ICD-10-CM | POA: Diagnosis not present

## 2014-12-01 DIAGNOSIS — M1990 Unspecified osteoarthritis, unspecified site: Secondary | ICD-10-CM | POA: Diagnosis not present

## 2014-12-01 DIAGNOSIS — E039 Hypothyroidism, unspecified: Secondary | ICD-10-CM | POA: Diagnosis not present

## 2014-12-01 DIAGNOSIS — Z862 Personal history of diseases of the blood and blood-forming organs and certain disorders involving the immune mechanism: Secondary | ICD-10-CM | POA: Diagnosis not present

## 2014-12-01 DIAGNOSIS — I10 Essential (primary) hypertension: Secondary | ICD-10-CM | POA: Diagnosis not present

## 2014-12-01 DIAGNOSIS — E119 Type 2 diabetes mellitus without complications: Secondary | ICD-10-CM | POA: Diagnosis not present

## 2014-12-01 DIAGNOSIS — E785 Hyperlipidemia, unspecified: Secondary | ICD-10-CM | POA: Insufficient documentation

## 2014-12-01 DIAGNOSIS — M5431 Sciatica, right side: Secondary | ICD-10-CM | POA: Diagnosis not present

## 2014-12-01 DIAGNOSIS — M109 Gout, unspecified: Secondary | ICD-10-CM | POA: Insufficient documentation

## 2014-12-01 DIAGNOSIS — F329 Major depressive disorder, single episode, unspecified: Secondary | ICD-10-CM | POA: Insufficient documentation

## 2014-12-01 DIAGNOSIS — Z8673 Personal history of transient ischemic attack (TIA), and cerebral infarction without residual deficits: Secondary | ICD-10-CM | POA: Diagnosis not present

## 2014-12-01 DIAGNOSIS — M25551 Pain in right hip: Secondary | ICD-10-CM | POA: Diagnosis present

## 2014-12-01 MED ORDER — HYDROCODONE-ACETAMINOPHEN 5-325 MG PO TABS: 1.0000 | ORAL_TABLET | Freq: Four times a day (QID) | ORAL | Status: DC | PRN

## 2014-12-01 MED ORDER — PREDNISONE 20 MG PO TABS: 40.0000 mg | ORAL_TABLET | Freq: Every day | ORAL | Status: DC

## 2014-12-01 NOTE — ED Provider Notes (Signed)
CSN: [id number]     Arrival date & time 12/01/14  1905 History  This chart was scribed for non-physician practitioner, Montine Circle, PA-C, working with Malvin Johns, MD, by Helane Gunther ED Scribe. This patient was seen in room WTR8/WTR8 and the patient's care was started at 7:42 PM     No chief complaint on file.  The history is provided by the patient. No language interpreter was used.   HPI Comments: Phillip Turner is a 54 y.o. male with PMHx of DM who presents to the Emergency Department complaining of rigth-sided, constant, worsening, aching hip pain onset 7/7. He states it feels like a cattle prod is hitting him in the hip every so often. He reports associated lower leg numbness and numbness in his toes. Pt denies any injuries. He went to the ED on 7/7 where an XR was done and he was diagnosed with osteoarthritis. He was told to come back if he experienced numbness or stiffness. Pt went back to the ED on 7/10 because of numbness on the anterior lower right leg and in his right toes. He again returned to the ED on 7/14 and was prescribed pain medication. On 7/22 he went to his orthopedist who said they could not see him because they never received the referral. He now has to wait for an entire week before seeing his orthopedist and is out of pain medication. He notes that his blood glucose levels have been very good recently. Pt denies back pain.  Past Medical History  Diagnosis Date  . Diabetes mellitus   . Hypertension   . Hypothyroidism   . Anxiety   . Depression   . Gout   . Arthritis   . Hyperlipidemia   . Rheumatic fever     as a teenager  . Stroke     does not remember when  . 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 Substance abuse     former cocaine user- not since March 2014   Past Surgical History  Procedure Laterality Date  . Tonsillectomy    . Boil removed from tailbone      Family History  Problem Relation Age of Onset  . Coronary artery disease Mother    . Diabetes type II Father   . Hypertension Father   . Colon cancer Neg Hx   . Esophageal cancer Neg Hx   . Rectal cancer Neg Hx   . Stomach cancer Neg Hx    History  Substance Use Topics  . Smoking status: Former Smoker -- 5 years    Types: Cigarettes  . Smokeless tobacco: Never Used  . Alcohol Use: Yes     Comment: Pt consumes beers occasionally-2 beers weekly    Review of Systems  Constitutional: Negative for fever and chills.  Gastrointestinal:       No bowel incontinence  Genitourinary:       No urinary incontinence  Musculoskeletal: Positive for myalgias and arthralgias. Negative for back pain.  Skin: Negative for wound.  Neurological: Positive for numbness.       No saddle anesthesia    Allergies  Shellfish allergy; Clonidine derivatives; Metformin and related; and Naproxen  Home Medications   Prior to Admission medications   Medication Sig Start Date End Date Taking? Authorizing Provider  amLODipine (NORVASC) 10 MG tablet TAKE 1 TABLET BY MOUTH EVERY DAY FOR BLOOD PRESSURE 06/06/14   Lew Dawes V, MD  aspirin 325 MG tablet Take 1 tablet (325  mg total) by mouth daily. For heart heart/stroke prevention Patient taking differently: Take 325 mg by mouth 2 (two) times daily. For heart heart/stroke prevention 04/09/12   Encarnacion Slates, NP  colchicine 0.6 MG tablet TAKE 1 TABLET BY MOUTH TWICE DAILY FOR GOUT FLARE-UPS 09/12/14   Lew Dawes V, MD  empagliflozin (JARDIANCE) 10 MG TABS tablet Take 10 mg by mouth daily.    Historical Provider, MD  furosemide (LASIX) 40 MG tablet Take 1 tablet (40 mg total) by mouth daily as needed for edema. Patient taking differently: Take 40 mg by mouth daily as needed for fluid or edema.  09/08/13   Aleksei Plotnikov V, MD  glipiZIDE (GLUCOTROL) 5 MG tablet Take by mouth 2 (two) times daily before a meal.    Historical Provider, MD  hydrochlorothiazide (HYDRODIURIL) 25 MG tablet Take 25 mg by mouth daily.    Historical Provider, MD   HYDROcodone-acetaminophen (NORCO/VICODIN) 5-325 MG per tablet Take 2 tablets by mouth 2 (two) times daily as needed for severe pain. 11/17/14   Everlene Balls, MD  ibuprofen (ADVIL,MOTRIN) 800 MG tablet Take 1 tablet (800 mg total) by mouth every 8 (eight) hours as needed for moderate pain. 11/17/14   Everlene Balls, MD  levothyroxine (SYNTHROID, LEVOTHROID) 175 MCG tablet TAKE 1 TABLET BY MOUTH DAILY BEFORE BREAKFAST Patient not taking: Reported on 11/10/2014    Aleksei Plotnikov V, MD  levothyroxine (SYNTHROID, LEVOTHROID) 88 MCG tablet Take 88 mcg by mouth daily before breakfast.    Historical Provider, MD  lisinopril (PRINIVIL,ZESTRIL) 20 MG tablet TAKE 1 TABLET BY MOUTH TWICE DAILY FOR HIGH BLOOD PRESSURE CONTROL 11/04/14   Lew Dawes V, MD  methocarbamol (ROBAXIN) 500 MG tablet Take 1 tablet (500 mg total) by mouth 2 (two) times daily. Patient not taking: Reported on 11/17/2014 11/10/14   Antonietta Breach, PA-C  methocarbamol (ROBAXIN) 500 MG tablet Take 1 tablet (500 mg total) by mouth 2 (two) times daily. 11/13/14   Glendell Docker, NP  potassium chloride SA (K-DUR,KLOR-CON) 20 MEQ tablet Take 2 tablets (40 mEq total) by mouth 2 (two) times daily. 07/13/14   Aleksei Plotnikov V, MD  Sildenafil Citrate (VIAGRA PO) Take by mouth as needed.    Historical Provider, MD  simvastatin (ZOCOR) 10 MG tablet Take 1 tablet by mouth daily. 11/04/14   Historical Provider, MD  traZODone (DESYREL) 100 MG tablet TAKE 2 TABLETS BY MOUTH EVERY NIGHT AT BEDTIME FOR SLEEP/DEPRESSION 07/07/14   Aleksei Plotnikov V, MD   BP 175/90 mmHg  Pulse 61  Temp(Src) 98.5 F (36.9 C) (Oral)  Resp 18  Ht 5\' 10"  (1.778 m)  Wt 280 lb (127.007 kg)  BMI 40.18 kg/m2  SpO2 99% Physical Exam  Constitutional: He is oriented to person, place, and time. He appears well-developed and well-nourished. No distress.  HENT:  Head: Normocephalic and atraumatic.  Mouth/Throat: Oropharynx is clear and moist.  Eyes: Conjunctivae and EOM are normal.  Pupils are equal, round, and reactive to light. Right eye exhibits no discharge. Left eye exhibits no discharge. No scleral icterus.  Neck: Normal range of motion. Neck supple. No tracheal deviation present.  Cardiovascular: Normal rate, regular rhythm and normal heart sounds.  Exam reveals no gallop and no friction rub.   No murmur heard. Pulmonary/Chest: Effort normal and breath sounds normal. No respiratory distress. He has no wheezes.  Abdominal: Soft. He exhibits no distension. There is no tenderness.  Musculoskeletal: Normal range of motion.  No lumbar paraspinal muscles tender to palpation, no  bony tenderness, step-offs, or gross abnormality or deformity of spine, patient is able to ambulate, moves all extremities  Bilateral great toe extension intact Bilateral plantar/dorsiflexion intact  Neurological: He is alert and oriented to person, place, and time.  Sensation and strength intact bilaterally   Skin: Skin is warm and dry. He is not diaphoretic.  Psychiatric: He has a normal mood and affect. His behavior is normal. Judgment and thought content normal.  Nursing note and vitals reviewed.   ED Course  Procedures  DIAGNOSTIC STUDIES: Oxygen Saturation is 99% on RA, normal by my interpretation.    COORDINATION OF CARE: 7:55 PM - Discussed possible sciatica. Discussed plans to order Prednisone. Advised pt to watch his blood glucose levels carefully while on Prednisone. Pt advised of plan for treatment and pt agrees.  Labs Review Labs Reviewed - No data to display  Imaging Review No results found.   EKG Interpretation None      MDM   Final diagnoses:  Sciatica, right    Patient with right buttock pain that is radiates to his right leg consistent with sciatica.  No neurological deficits and normal neuro exam.  Patient is ambulatory.  No loss of bowel or bladder control.  Doubt cauda equina.  Denies fever,  doubt epidural abscess or other lesion. Recommend back  exercises, stretching, RICE, and will treat with a short course of prednisone.  Encouraged the patient that there could be a need for additional workup and/or imaging such as MRI, if the symptoms do not resolve. Patient advised that if the back pain does not resolve, or radiates, this could progress to more serious conditions and is encouraged to follow-up with PCP or orthopedics within 2 weeks.     I personally performed the services described in this documentation, which was scribed in my presence. The recorded information has been reviewed and is accurate.    Montine Circle, PA-C 12/02/14 [id number]  Malvin Johns, MD 12/02/14 1505

## 2014-12-01 NOTE — Discharge Instructions (Signed)
Sciatica with Rehab The sciatic nerve runs from the back down the leg and is responsible for sensation and control of the muscles in the back (posterior) side of the thigh, lower leg, and foot. Sciatica is a condition that is characterized by inflammation of this nerve.  SYMPTOMS   Signs of nerve damage, including numbness and/or weakness along the posterior side of the lower extremity.  Pain in the back of the thigh that may also travel down the leg.  Pain that worsens when sitting for long periods of time.  Occasionally, pain in the back or buttock. CAUSES  Inflammation of the sciatic nerve is the cause of sciatica. The inflammation is due to something irritating the nerve. Common sources of irritation include:  Sitting for long periods of time.  Direct trauma to the nerve.  Arthritis of the spine.  Herniated or ruptured disk.  Slipping of the vertebrae (spondylolisthesis).  Pressure from soft tissues, such as muscles or ligament-like tissue (fascia). RISK INCREASES WITH:  Sports that place pressure or stress on the spine (football or weightlifting).  Poor strength and flexibility.  Failure to warm up properly before activity.  Family history of low back pain or disk disorders.  Previous back injury or surgery.  Poor body mechanics, especially when lifting, or poor posture. PREVENTION   Warm up and stretch properly before activity.  Maintain physical fitness:  Strength, flexibility, and endurance.  Cardiovascular fitness.  Learn and use proper technique, especially with posture and lifting. When possible, have coach correct improper technique.  Avoid activities that place stress on the spine. PROGNOSIS If treated properly, then sciatica usually resolves within 6 weeks. However, occasionally surgery is necessary.  RELATED COMPLICATIONS   Permanent nerve damage, including pain, numbness, tingle, or weakness.  Chronic back pain.  Risks of surgery: infection,  bleeding, nerve damage, or damage to surrounding tissues. TREATMENT Treatment initially involves resting from any activities that aggravate your symptoms. The use of ice and medication may help reduce pain and inflammation. The use of strengthening and stretching exercises may help reduce pain with activity. These exercises may be performed at home or with referral to a therapist. A therapist may recommend further treatments, such as transcutaneous electronic nerve stimulation (TENS) or ultrasound. Your caregiver may recommend corticosteroid injections to help reduce inflammation of the sciatic nerve. If symptoms persist despite non-surgical (conservative) treatment, then surgery may be recommended. MEDICATION  If pain medication is necessary, then nonsteroidal anti-inflammatory medications, such as aspirin and ibuprofen, or other minor pain relievers, such as acetaminophen, are often recommended.  Do not take pain medication for 7 days before surgery.  Prescription pain relievers may be given if deemed necessary by your caregiver. Use only as directed and only as much as you need.  Ointments applied to the skin may be helpful.  Corticosteroid injections may be given by your caregiver. These injections should be reserved for the most serious cases, because they may only be given a certain number of times. HEAT AND COLD  Cold treatment (icing) relieves pain and reduces inflammation. Cold treatment should be applied for 10 to 15 minutes every 2 to 3 hours for inflammation and pain and immediately after any activity that aggravates your symptoms. Use ice packs or massage the area with a piece of ice (ice massage).  Heat treatment may be used prior to performing the stretching and strengthening activities prescribed by your caregiver, physical therapist, or athletic trainer. Use a heat pack or soak the injury in warm water.   SEEK MEDICAL CARE IF:  Treatment seems to offer no benefit, or the condition  worsens.  Any medications produce adverse side effects. EXERCISES  RANGE OF MOTION (ROM) AND STRETCHING EXERCISES - Sciatica Most people with sciatic will find that their symptoms worsen with either excessive bending forward (flexion) or arching at the low back (extension). The exercises which will help resolve your symptoms will focus on the opposite motion. Your physician, physical therapist or athletic trainer will help you determine which exercises will be most helpful to resolve your low back pain. Do not complete any exercises without first consulting with your clinician. Discontinue any exercises which worsen your symptoms until you speak to your clinician. If you have pain, numbness or tingling which travels down into your buttocks, leg or foot, the goal of the therapy is for these symptoms to move closer to your back and eventually resolve. Occasionally, these leg symptoms will get better, but your low back pain may worsen; this is typically an indication of progress in your rehabilitation. Be certain to be very alert to any changes in your symptoms and the activities in which you participated in the 24 hours prior to the change. Sharing this information with your clinician will allow him/her to most efficiently treat your condition. These exercises may help you when beginning to rehabilitate your injury. Your symptoms may resolve with or without further involvement from your physician, physical therapist or athletic trainer. While completing these exercises, remember:   Restoring tissue flexibility helps normal motion to return to the joints. This allows healthier, less painful movement and activity.  An effective stretch should be held for at least 30 seconds.  A stretch should never be painful. You should only feel a gentle lengthening or release in the stretched tissue. FLEXION RANGE OF MOTION AND STRETCHING EXERCISES: STRETCH - Flexion, Single Knee to Chest   Lie on a firm bed or floor  with both legs extended in front of you.  Keeping one leg in contact with the floor, bring your opposite knee to your chest. Hold your leg in place by either grabbing behind your thigh or at your knee.  Pull until you feel a gentle stretch in your low back. Hold __________ seconds.  Slowly release your grasp and repeat the exercise with the opposite side. Repeat __________ times. Complete this exercise __________ times per day.  STRETCH - Flexion, Double Knee to Chest  Lie on a firm bed or floor with both legs extended in front of you.  Keeping one leg in contact with the floor, bring your opposite knee to your chest.  Tense your stomach muscles to support your back and then lift your other knee to your chest. Hold your legs in place by either grabbing behind your thighs or at your knees.  Pull both knees toward your chest until you feel a gentle stretch in your low back. Hold __________ seconds.  Tense your stomach muscles and slowly return one leg at a time to the floor. Repeat __________ times. Complete this exercise __________ times per day.  STRETCH - Low Trunk Rotation   Lie on a firm bed or floor. Keeping your legs in front of you, bend your knees so they are both pointed toward the ceiling and your feet are flat on the floor.  Extend your arms out to the side. This will stabilize your upper body by keeping your shoulders in contact with the floor.  Gently and slowly drop both knees together to one side until   you feel a gentle stretch in your low back. Hold for __________ seconds.  Tense your stomach muscles to support your low back as you bring your knees back to the starting position. Repeat the exercise to the other side. Repeat __________ times. Complete this exercise __________ times per day  EXTENSION RANGE OF MOTION AND FLEXIBILITY EXERCISES: STRETCH - Extension, Prone on Elbows  Lie on your stomach on the floor, a bed will be too soft. Place your palms about shoulder  width apart and at the height of your head.  Place your elbows under your shoulders. If this is too painful, stack pillows under your chest.  Allow your body to relax so that your hips drop lower and make contact more completely with the floor.  Hold this position for __________ seconds.  Slowly return to lying flat on the floor. Repeat __________ times. Complete this exercise __________ times per day.  RANGE OF MOTION - Extension, Prone Press Ups  Lie on your stomach on the floor, a bed will be too soft. Place your palms about shoulder width apart and at the height of your head.  Keeping your back as relaxed as possible, slowly straighten your elbows while keeping your hips on the floor. You may adjust the placement of your hands to maximize your comfort. As you gain motion, your hands will come more underneath your shoulders.  Hold this position __________ seconds.  Slowly return to lying flat on the floor. Repeat __________ times. Complete this exercise __________ times per day.  STRENGTHENING EXERCISES - Sciatica  These exercises may help you when beginning to rehabilitate your injury. These exercises should be done near your "sweet spot." This is the neutral, low-back arch, somewhere between fully rounded and fully arched, that is your least painful position. When performed in this safe range of motion, these exercises can be used for people who have either a flexion or extension based injury. These exercises may resolve your symptoms with or without further involvement from your physician, physical therapist or athletic trainer. While completing these exercises, remember:   Muscles can gain both the endurance and the strength needed for everyday activities through controlled exercises.  Complete these exercises as instructed by your physician, physical therapist or athletic trainer. Progress with the resistance and repetition exercises only as your caregiver advises.  You may  experience muscle soreness or fatigue, but the pain or discomfort you are trying to eliminate should never worsen during these exercises. If this pain does worsen, stop and make certain you are following the directions exactly. If the pain is still present after adjustments, discontinue the exercise until you can discuss the trouble with your clinician. STRENGTHENING - Deep Abdominals, Pelvic Tilt   Lie on a firm bed or floor. Keeping your legs in front of you, bend your knees so they are both pointed toward the ceiling and your feet are flat on the floor.  Tense your lower abdominal muscles to press your low back into the floor. This motion will rotate your pelvis so that your tail bone is scooping upwards rather than pointing at your feet or into the floor.  With a gentle tension and even breathing, hold this position for __________ seconds. Repeat __________ times. Complete this exercise __________ times per day.  STRENGTHENING - Abdominals, Crunches   Lie on a firm bed or floor. Keeping your legs in front of you, bend your knees so they are both pointed toward the ceiling and your feet are flat on the   floor. Cross your arms over your chest.  Slightly tip your chin down without bending your neck.  Tense your abdominals and slowly lift your trunk high enough to just clear your shoulder blades. Lifting higher can put excessive stress on the low back and does not further strengthen your abdominal muscles.  Control your return to the starting position. Repeat __________ times. Complete this exercise __________ times per day.  STRENGTHENING - Quadruped, Opposite UE/LE Lift  Assume a hands and knees position on a firm surface. Keep your hands under your shoulders and your knees under your hips. You may place padding under your knees for comfort.  Find your neutral spine and gently tense your abdominal muscles so that you can maintain this position. Your shoulders and hips should form a rectangle  that is parallel with the floor and is not twisted.  Keeping your trunk steady, lift your right hand no higher than your shoulder and then your left leg no higher than your hip. Make sure you are not holding your breath. Hold this position __________ seconds.  Continuing to keep your abdominal muscles tense and your back steady, slowly return to your starting position. Repeat with the opposite arm and leg. Repeat __________ times. Complete this exercise __________ times per day.  STRENGTHENING - Abdominals and Quadriceps, Straight Leg Raise   Lie on a firm bed or floor with both legs extended in front of you.  Keeping one leg in contact with the floor, bend the other knee so that your foot can rest flat on the floor.  Find your neutral spine, and tense your abdominal muscles to maintain your spinal position throughout the exercise.  Slowly lift your straight leg off the floor about 6 inches for a count of 15, making sure to not hold your breath.  Still keeping your neutral spine, slowly lower your leg all the way to the floor. Repeat this exercise with each leg __________ times. Complete this exercise __________ times per day. POSTURE AND BODY MECHANICS CONSIDERATIONS - Sciatica Keeping correct posture when sitting, standing or completing your activities will reduce the stress put on different body tissues, allowing injured tissues a chance to heal and limiting painful experiences. The following are general guidelines for improved posture. Your physician or physical therapist will provide you with any instructions specific to your needs. While reading these guidelines, remember:  The exercises prescribed by your provider will help you have the flexibility and strength to maintain correct postures.  The correct posture provides the optimal environment for your joints to work. All of your joints have less wear and tear when properly supported by a spine with good posture. This means you will  experience a healthier, less painful body.  Correct posture must be practiced with all of your activities, especially prolonged sitting and standing. Correct posture is as important when doing repetitive low-stress activities (typing) as it is when doing a single heavy-load activity (lifting). RESTING POSITIONS Consider which positions are most painful for you when choosing a resting position. If you have pain with flexion-based activities (sitting, bending, stooping, squatting), choose a position that allows you to rest in a less flexed posture. You would want to avoid curling into a fetal position on your side. If your pain worsens with extension-based activities (prolonged standing, working overhead), avoid resting in an extended position such as sleeping on your stomach. Most people will find more comfort when they rest with their spine in a more neutral position, neither too rounded nor too   arched. Lying on a non-sagging bed on your side with a pillow between your knees, or on your back with a pillow under your knees will often provide some relief. Keep in mind, being in any one position for a prolonged period of time, no matter how correct your posture, can still lead to stiffness. PROPER SITTING POSTURE In order to minimize stress and discomfort on your spine, you must sit with correct posture Sitting with good posture should be effortless for a healthy body. Returning to good posture is a gradual process. Many people can work toward this most comfortably by using various supports until they have the flexibility and strength to maintain this posture on their own. When sitting with proper posture, your ears will fall over your shoulders and your shoulders will fall over your hips. You should use the back of the chair to support your upper back. Your low back will be in a neutral position, just slightly arched. You may place a small pillow or folded towel at the base of your low back for support.  When  working at a desk, create an environment that supports good, upright posture. Without extra support, muscles fatigue and lead to excessive strain on joints and other tissues. Keep these recommendations in mind: CHAIR:   A chair should be able to slide under your desk when your back makes contact with the back of the chair. This allows you to work closely.  The chair's height should allow your eyes to be level with the upper part of your monitor and your hands to be slightly lower than your elbows. BODY POSITION  Your feet should make contact with the floor. If this is not possible, use a foot rest.  Keep your ears over your shoulders. This will reduce stress on your neck and low back. INCORRECT SITTING POSTURES   If you are feeling tired and unable to assume a healthy sitting posture, do not slouch or slump. This puts excessive strain on your back tissues, causing more damage and pain. Healthier options include:  Using more support, like a lumbar pillow.  Switching tasks to something that requires you to be upright or walking.  Talking a brief walk.  Lying down to rest in a neutral-spine position. PROLONGED STANDING WHILE SLIGHTLY LEANING FORWARD  When completing a task that requires you to lean forward while standing in one place for a long time, place either foot up on a stationary 2-4 inch high object to help maintain the best posture. When both feet are on the ground, the low back tends to lose its slight inward curve. If this curve flattens (or becomes too large), then the back and your other joints will experience too much stress, fatigue more quickly and can cause pain.  CORRECT STANDING POSTURES Proper standing posture should be assumed with all daily activities, even if they only take a few moments, like when brushing your teeth. As in sitting, your ears should fall over your shoulders and your shoulders should fall over your hips. You should keep a slight tension in your abdominal  muscles to brace your spine. Your tailbone should point down to the ground, not behind your body, resulting in an over-extended swayback posture.  INCORRECT STANDING POSTURES  Common incorrect standing postures include a forward head, locked knees and/or an excessive swayback. WALKING Walk with an upright posture. Your ears, shoulders and hips should all line-up. PROLONGED ACTIVITY IN A FLEXED POSITION When completing a task that requires you to bend forward   at your waist or lean over a low surface, try to find a way to stabilize 3 of 4 of your limbs. You can place a hand or elbow on your thigh or rest a knee on the surface you are reaching across. This will provide you more stability so that your muscles do not fatigue as quickly. By keeping your knees relaxed, or slightly bent, you will also reduce stress across your low back. CORRECT LIFTING TECHNIQUES DO :   Assume a wide stance. This will provide you more stability and the opportunity to get as close as possible to the object which you are lifting.  Tense your abdominals to brace your spine; then bend at the knees and hips. Keeping your back locked in a neutral-spine position, lift using your leg muscles. Lift with your legs, keeping your back straight.  Test the weight of unknown objects before attempting to lift them.  Try to keep your elbows locked down at your sides in order get the best strength from your shoulders when carrying an object.  Always ask for help when lifting heavy or awkward objects. INCORRECT LIFTING TECHNIQUES DO NOT:   Lock your knees when lifting, even if it is a small object.  Bend and twist. Pivot at your feet or move your feet when needing to change directions.  Assume that you cannot safely pick up a paperclip without proper posture. Document Released: 04/22/2005 Document Revised: 09/06/2013 Document Reviewed: 08/04/2008 [employer] Patient Information 2015 [employer]. This information is not intended to  replace advice given to you by your health care provider. Make sure you discuss any questions you have with your health care provider.  

## 2014-12-01 NOTE — ED Notes (Signed)
Pt states he came here 7/7 for the hip pain after sitting on his porch and his hip began hurting for no reason. States on 7/10 began having right foot numbness. Came back again on the 14th for the same problem. Saw his PCP on the 20th and was referred to an [place] to the Orthopedist this past Friday and was not seen because his PCP office never sent the Orthopedist the referral. Notified his PCP earlier this week of the problem and his PCP is out of the country until next week. Now complaining of continued pain, no longer has anymore pain medication and can't reach his PCP or get in to see the Orthopedist.

## 2014-12-06 ENCOUNTER — Other Ambulatory Visit: Payer: Self-pay | Admitting: Orthopedic Surgery

## 2014-12-06 DIAGNOSIS — M5416 Radiculopathy, lumbar region: Secondary | ICD-10-CM | POA: Diagnosis not present

## 2014-12-06 DIAGNOSIS — M545 Low back pain: Secondary | ICD-10-CM

## 2014-12-21 ENCOUNTER — Ambulatory Visit
Admission: RE | Admit: 2014-12-21 | Discharge: 2014-12-21 | Disposition: A | Discharge status: Home / Self Care | Payer: Commercial Managed Care - HMO | Source: Ambulatory Visit | Attending: Orthopedic Surgery | Admitting: Orthopedic Surgery

## 2014-12-21 DIAGNOSIS — M545 Low back pain: Secondary | ICD-10-CM

## 2014-12-21 DIAGNOSIS — M5126 Other intervertebral disc displacement, lumbar region: Secondary | ICD-10-CM | POA: Diagnosis not present

## 2014-12-21 DIAGNOSIS — M5136 Other intervertebral disc degeneration, lumbar region: Secondary | ICD-10-CM | POA: Diagnosis not present

## 2014-12-21 DIAGNOSIS — M47816 Spondylosis without myelopathy or radiculopathy, lumbar region: Secondary | ICD-10-CM | POA: Diagnosis not present

## 2014-12-21 DIAGNOSIS — M9973 Connective tissue and disc stenosis of intervertebral foramina of lumbar region: Secondary | ICD-10-CM | POA: Diagnosis not present

## 2014-12-26 DIAGNOSIS — K5900 Constipation, unspecified: Secondary | ICD-10-CM | POA: Diagnosis not present

## 2014-12-26 DIAGNOSIS — I10 Essential (primary) hypertension: Secondary | ICD-10-CM | POA: Diagnosis not present

## 2014-12-26 DIAGNOSIS — M1611 Unilateral primary osteoarthritis, right hip: Secondary | ICD-10-CM | POA: Diagnosis not present

## 2014-12-26 DIAGNOSIS — M545 Low back pain: Secondary | ICD-10-CM | POA: Diagnosis not present

## 2014-12-26 DIAGNOSIS — M1A9XX0 Chronic gout, unspecified, without tophus (tophi): Secondary | ICD-10-CM | POA: Diagnosis not present

## 2014-12-26 DIAGNOSIS — Z125 Encounter for screening for malignant neoplasm of prostate: Secondary | ICD-10-CM | POA: Diagnosis not present

## 2014-12-26 DIAGNOSIS — E119 Type 2 diabetes mellitus without complications: Secondary | ICD-10-CM | POA: Diagnosis not present

## 2014-12-26 DIAGNOSIS — E039 Hypothyroidism, unspecified: Secondary | ICD-10-CM | POA: Diagnosis not present

## 2014-12-27 DIAGNOSIS — M5116 Intervertebral disc disorders with radiculopathy, lumbar region: Secondary | ICD-10-CM | POA: Diagnosis not present

## 2014-12-27 DIAGNOSIS — M5416 Radiculopathy, lumbar region: Secondary | ICD-10-CM | POA: Diagnosis not present

## 2015-01-01 ENCOUNTER — Other Ambulatory Visit: Payer: Self-pay | Admitting: Internal Medicine

## 2015-01-13 DIAGNOSIS — M5116 Intervertebral disc disorders with radiculopathy, lumbar region: Secondary | ICD-10-CM | POA: Diagnosis not present

## 2015-01-13 DIAGNOSIS — M5416 Radiculopathy, lumbar region: Secondary | ICD-10-CM | POA: Diagnosis not present

## 2015-01-21 ENCOUNTER — Encounter (HOSPITAL_COMMUNITY): Payer: Self-pay | Admitting: Emergency Medicine

## 2015-01-21 ENCOUNTER — Emergency Department (HOSPITAL_COMMUNITY)
Admission: EM | Admit: 2015-01-21 | Discharge: 2015-01-21 | Disposition: A | Discharge status: Home / Self Care | Payer: Commercial Managed Care - HMO | Attending: Emergency Medicine | Admitting: Emergency Medicine

## 2015-01-21 DIAGNOSIS — M1990 Unspecified osteoarthritis, unspecified site: Secondary | ICD-10-CM | POA: Diagnosis not present

## 2015-01-21 DIAGNOSIS — Z862 Personal history of diseases of the blood and blood-forming organs and certain disorders involving the immune mechanism: Secondary | ICD-10-CM | POA: Insufficient documentation

## 2015-01-21 DIAGNOSIS — F419 Anxiety disorder, unspecified: Secondary | ICD-10-CM | POA: Insufficient documentation

## 2015-01-21 DIAGNOSIS — Z7952 Long term (current) use of systemic steroids: Secondary | ICD-10-CM | POA: Insufficient documentation

## 2015-01-21 DIAGNOSIS — E663 Overweight: Secondary | ICD-10-CM | POA: Insufficient documentation

## 2015-01-21 DIAGNOSIS — Z79899 Other long term (current) drug therapy: Secondary | ICD-10-CM | POA: Diagnosis not present

## 2015-01-21 DIAGNOSIS — E785 Hyperlipidemia, unspecified: Secondary | ICD-10-CM | POA: Diagnosis not present

## 2015-01-21 DIAGNOSIS — I10 Essential (primary) hypertension: Secondary | ICD-10-CM | POA: Insufficient documentation

## 2015-01-21 DIAGNOSIS — Z87891 Personal history of nicotine dependence: Secondary | ICD-10-CM | POA: Insufficient documentation

## 2015-01-21 DIAGNOSIS — F329 Major depressive disorder, single episode, unspecified: Secondary | ICD-10-CM | POA: Insufficient documentation

## 2015-01-21 DIAGNOSIS — Z8673 Personal history of transient ischemic attack (TIA), and cerebral infarction without residual deficits: Secondary | ICD-10-CM | POA: Diagnosis not present

## 2015-01-21 DIAGNOSIS — Z7982 Long term (current) use of aspirin: Secondary | ICD-10-CM | POA: Diagnosis not present

## 2015-01-21 DIAGNOSIS — E119 Type 2 diabetes mellitus without complications: Secondary | ICD-10-CM | POA: Insufficient documentation

## 2015-01-21 DIAGNOSIS — E039 Hypothyroidism, unspecified: Secondary | ICD-10-CM | POA: Diagnosis not present

## 2015-01-21 LAB — CBC
HEMATOCRIT: 41.3 % (ref 39.0–52.0)
Hemoglobin: 14.2 g/dL (ref 13.0–17.0)
MCH: 28.9 pg (ref 26.0–34.0)
MCHC: 34.4 g/dL (ref 30.0–36.0)
MCV: 83.9 fL (ref 78.0–100.0)
Platelets: 283 10*3/uL (ref 150–400)
RBC: 4.92 MIL/uL (ref 4.22–5.81)
RDW: 13.8 % (ref 11.5–15.5)
WBC: 9.4 10*3/uL (ref 4.0–10.5)

## 2015-01-21 LAB — BASIC METABOLIC PANEL
ANION GAP: 11 (ref 5–15)
BUN: 7 mg/dL (ref 6–20)
CHLORIDE: 97 mmol/L — AB (ref 101–111)
CO2: 27 mmol/L (ref 22–32)
Calcium: 9.7 mg/dL (ref 8.9–10.3)
Creatinine, Ser: 1.26 mg/dL — ABNORMAL HIGH (ref 0.61–1.24)
GFR calc non Af Amer: >60 mL/min (ref >60)
GLUCOSE: 88 mg/dL (ref 65–99)
POTASSIUM: 2.6 mmol/L — AB (ref 3.5–5.1)
Sodium: 135 mmol/L (ref 135–145)

## 2015-01-21 LAB — MAGNESIUM: Magnesium: 1.9 mg/dL (ref 1.7–2.4)

## 2015-01-21 MED ORDER — POTASSIUM CHLORIDE CRYS ER 20 MEQ PO TBCR
20.0000 meq | EXTENDED_RELEASE_TABLET | Freq: Once | ORAL | Status: AC
  Administered 2015-01-21: 20 meq via ORAL
  Filled 2015-01-21: qty 1

## 2015-01-21 MED ORDER — SODIUM CHLORIDE 0.9 % IV BOLUS (SEPSIS): 500.0000 mL | Freq: Once | INTRAVENOUS | Status: DC

## 2015-01-21 NOTE — ED Notes (Signed)
Lab called Potassium 2.6 results to nurse first line and given to [place].  Called Dr. Sabra Heck to report lab.

## 2015-01-21 NOTE — Discharge Instructions (Signed)
How to Take Your Blood Pressure HOW DO I GET A BLOOD PRESSURE MACHINE?  You can buy an electronic home blood pressure machine at your local pharmacy. Insurance will sometimes cover the cost if you have a prescription.  Ask your doctor what type of machine is best for you. There are different machines for your arm and your wrist.  If you decide to buy a machine to check your blood pressure on your arm, first check the size of your arm so you can buy the right size cuff. To check the size of your arm:   Use a measuring tape that shows both inches and centimeters.   Wrap the measuring tape around the upper-middle part of your arm. You may need someone to help you measure.   Write down your arm measurement in both inches and centimeters.   To measure your blood pressure correctly, it is important to have the right size cuff.   If your arm is up to 13 inches (up to 34 centimeters), get an adult cuff size.  If your arm is 13 to 17 inches (35 to 44 centimeters), get a large adult cuff size.    If your arm is 17 to 20 inches (45 to 52 centimeters), get an adult thigh cuff.  WHAT DO THE NUMBERS MEAN?   There are two numbers that make up your blood pressure. For example: 120/80.  The first number (120 in our example) is called the "systolic pressure." It is a measure of the pressure in your blood vessels when your heart is pumping blood.  The second number (80 in our example) is called the "diastolic pressure." It is a measure of the pressure in your blood vessels when your heart is resting between beats.  Your doctor will tell you what your blood pressure should be. WHAT SHOULD I DO BEFORE I CHECK MY BLOOD PRESSURE?   Try to rest or relax for at least 30 minutes before you check your blood pressure.  Do not smoke.  Do not have any drinks with caffeine, such as:  Soda.  Coffee.  Tea.  Check your blood pressure in a quiet room.  Sit down and stretch out your arm on a table.  Keep your arm at about the level of your heart. Let your arm relax.  Make sure that your legs are not crossed. HOW DO I CHECK MY BLOOD PRESSURE?  Follow the directions that came with your machine.  Make sure you remove any tight-fighting clothing from your arm or wrist. Wrap the cuff around your upper arm or wrist. You should be able to fit a finger between the cuff and your arm. If you cannot fit a finger between the cuff and your arm, it is too tight and should be removed and rewrapped.  Some units require you to manually pump up the arm cuff.  Automatic units inflate the cuff when you press a button.  Cuff deflation is automatic in both models.  After the cuff is inflated, the unit measures your blood pressure and pulse. The readings are shown on a monitor. Hold still and breathe normally while the cuff is inflated.  Getting a reading takes less than a minute.  Some models store readings in a memory. Some provide a printout of readings. If your machine does not store your readings, keep a written record.  Take readings with you to your next visit with your doctor. Document Released: 04/04/2008 Document Revised: 09/06/2013 Document Reviewed: 06/17/2013 [employer] Patient Information  2015 [employer]. This information is not intended to replace advice given to you by your health care provider. Make sure you discuss any questions you have with your health care provider.  Hypertension Hypertension, commonly called high blood pressure, is when the force of blood pumping through your arteries is too strong. Your arteries are the blood vessels that carry blood from your heart throughout your body. A blood pressure reading consists of a higher number over a lower number, such as 110/72. The higher number (systolic) is the pressure inside your arteries when your heart pumps. The lower number (diastolic) is the pressure inside your arteries when your heart relaxes. Ideally you want your blood  pressure below 120/80. Hypertension forces your heart to work harder to pump blood. Your arteries may become narrow or stiff. Having hypertension puts you at risk for heart disease, stroke, and other problems.  RISK FACTORS Some risk factors for high blood pressure are controllable. Others are not.  Risk factors you cannot control include:   Race. You may be at higher risk if you are African American.  Age. Risk increases with age.  Gender. Men are at higher risk than women before age 20 years. After age 35, women are at higher risk than men. Risk factors you can control include:  Not getting enough exercise or physical activity.  Being overweight.  Getting too much fat, sugar, calories, or salt in your diet.  Drinking too much alcohol. SIGNS AND SYMPTOMS Hypertension does not usually cause signs or symptoms. Extremely high blood pressure (hypertensive crisis) may cause headache, anxiety, shortness of breath, and nosebleed. DIAGNOSIS  To check if you have hypertension, your health care provider will measure your blood pressure while you are seated, with your arm held at the level of your heart. It should be measured at least twice using the same arm. Certain conditions can cause a difference in blood pressure between your right and left arms. A blood pressure reading that is higher than normal on one occasion does not mean that you need treatment. If one blood pressure reading is high, ask your health care provider about having it checked again. TREATMENT  Treating high blood pressure includes making lifestyle changes and possibly taking medicine. Living a healthy lifestyle can help lower high blood pressure. You may need to change some of your habits. Lifestyle changes may include:  Following the DASH diet. This diet is high in fruits, vegetables, and whole grains. It is low in salt, red meat, and added sugars.  Getting at least 2 hours of brisk physical activity every week.  Losing  weight if necessary.  Not smoking.  Limiting alcoholic beverages.  Learning ways to reduce stress. If lifestyle changes are not enough to get your blood pressure under control, your health care provider may prescribe medicine. You may need to take more than one. Work closely with your health care provider to understand the risks and benefits. HOME CARE INSTRUCTIONS  Have your blood pressure rechecked as directed by your health care provider.   Take medicines only as directed by your health care provider. Follow the directions carefully. Blood pressure medicines must be taken as prescribed. The medicine does not work as well when you skip doses. Skipping doses also puts you at risk for problems.   Do not smoke.   Monitor your blood pressure at home as directed by your health care provider. SEEK MEDICAL CARE IF:   You think you are having a reaction to medicines taken.  You have recurrent headaches or feel dizzy.  You have swelling in your ankles.  You have trouble with your vision. SEEK IMMEDIATE MEDICAL CARE IF:  You develop a severe headache or confusion.  You have unusual weakness, numbness, or feel faint.  You have severe chest or abdominal pain.  You vomit repeatedly.  You have trouble breathing. MAKE SURE YOU:   Understand these instructions.  Will watch your condition.  Will get help right away if you are not doing well or get worse. Document Released: 04/22/2005 Document Revised: 09/06/2013 Document Reviewed: 02/12/2013 [facility] Patient Information 2015 [place], [place]. This information is not intended to replace advice given to you by your health care provider. Make sure you discuss any questions you have with your health care provider.

## 2015-01-21 NOTE — ED Notes (Signed)
Pt c/o sciatica nerve pain that is currently being treated from another Dr. Wilburn Mylar pt checked [facility] at [facility] 156/91. Pt is taking medication for BP. Today BP 185 over upper 90's. Pt took total of 2 HCTZ today. 3 hours later pt rechecked his BP 193/110.

## 2015-01-21 NOTE — ED Notes (Signed)
Pt has no c/o chest pain or shortness of breath.  Pt reports he took HCTZ x  2 tabs and Potassium tab this morning d/t feeling "crampy".  Pt states he takes Potassium PRN when he takes fluid pill.

## 2015-01-21 NOTE — ED Notes (Signed)
Lab called to add on serum mag level

## 2015-01-21 NOTE — ED Provider Notes (Signed)
CSN: [id number]     Arrival date & time 01/21/15  1834 History   First MD Initiated Contact with Patient 01/21/15 2048     Chief Complaint  Patient presents with  . Hypertension  . Sciatica   Patient is a 54 y.o. male presenting with general illness. The history is provided by the patient. No language interpreter was used.  Illness Location:  NA Quality:  Hypertension Severity:  Moderate Onset quality:  Gradual Timing:  Constant Progression:  Unchanged Chronicity: acute on chronic. Context:  PMHx of HTN, HLD, DM, hypothyroidism, TIA, and sickle cell trait presenting with hypertension. Patient has been taking same dose of amlodipine PO QD, HCTZ BID, & Lisinopril PO for the past several months. [facility] and noted his SBP to be 185. Patient returned home and took extra dose of HCTZ that afternoon. [facility] and rechecked [facility] that evening and found SBP to be 190s. Only symptom at that time was tingling and chest. Associated symptoms: no abdominal pain, no chest pain, no cough, no fever, no nausea, no shortness of breath and no vomiting     Past Medical History  Diagnosis Date  . Diabetes mellitus   . Hypertension   . Hypothyroidism   . Anxiety   . Depression   . Gout   . Arthritis   . Hyperlipidemia   . Rheumatic fever     as a teenager  . Stroke     does not remember when  . 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 Substance abuse     former cocaine user- not since March 2014   Past Surgical History  Procedure Laterality Date  . Tonsillectomy    . Boil removed from tailbone      Family History  Problem Relation Age of Onset  . Coronary artery disease Mother   . Diabetes type II Father   . Hypertension Father   . Colon cancer Neg Hx   . Esophageal cancer Neg Hx   . Rectal cancer Neg Hx   . Stomach cancer Neg Hx    Social History  Substance Use Topics  . Smoking status: Former Smoker -- 5 years    Types: Cigarettes  .  Smokeless tobacco: Never Used  . Alcohol Use: Yes     Comment: Pt consumes beers occasionally-2 beers weekly    Review of Systems  Constitutional: Negative for fever and chills.  Respiratory: Negative for cough and shortness of breath.   Cardiovascular: Negative for chest pain.  Gastrointestinal: Negative for nausea, vomiting and abdominal pain.  All other systems reviewed and are negative.   Allergies  Clonidine derivatives; Shellfish allergy; Metformin and related; Naproxen; and Other  Home Medications   Prior to Admission medications   Medication Sig Start Date End Date Taking? Authorizing Jude Linck  amLODipine (NORVASC) 10 MG tablet TAKE 1 TABLET BY MOUTH EVERY DAY FOR BLOOD PRESSURE 06/06/14  Yes Aleksei Plotnikov V, MD  aspirin 325 MG tablet Take 1 tablet (325 mg total) by mouth daily. For heart heart/stroke prevention Patient taking differently: Take 325 mg by mouth 2 (two) times daily. For heart heart/stroke prevention 04/09/12  Yes Encarnacion Slates, NP  colchicine 0.6 MG tablet TAKE 1 TABLET BY MOUTH TWICE DAILY FOR GOUT FLARE-UPS 09/12/14  Yes Aleksei Plotnikov V, MD  diclofenac sodium (VOLTAREN) 1 % GEL Apply 1 application topically 2 (two) times daily. 11/21/14  Yes Historical Cordae Mccarey, MD  empagliflozin (JARDIANCE) 10 MG TABS tablet Take 10 mg by mouth daily.   Yes Historical Caymen Dubray, MD  furosemide (LASIX) 40 MG tablet Take 1 tablet (40 mg total) by mouth daily as needed for edema. Patient taking differently: Take 40 mg by mouth daily as needed for fluid or edema.  09/08/13  Yes Aleksei Plotnikov V, MD  glipiZIDE (GLUCOTROL) 5 MG tablet Take 5 mg by mouth 2 (two) times daily before a meal.    Yes Historical Basem Yannuzzi, MD  hydrochlorothiazide (HYDRODIURIL) 25 MG tablet Take 25 mg by mouth daily.   Yes Historical Kaleb Sek, MD  ibuprofen (ADVIL,MOTRIN) 800 MG tablet Take 1 tablet (800 mg total) by mouth every 8 (eight) hours as needed for moderate pain. 11/17/14  Yes Everlene Balls, MD   lisinopril (PRINIVIL,ZESTRIL) 20 MG tablet TAKE 1 TABLET BY MOUTH TWICE DAILY FOR HIGH BLOOD PRESSURE CONTROL 11/04/14  Yes Aleksei Plotnikov V, MD  LYRICA 75 MG capsule Take 75 mg by mouth 2 (two) times daily. 12/27/14  Yes Historical Raphael Espe, MD  methocarbamol (ROBAXIN) 500 MG tablet Take 1 tablet (500 mg total) by mouth 2 (two) times daily. Patient taking differently: Take 500 mg by mouth every 8 (eight) hours as needed for muscle spasms.  11/10/14  Yes Antonietta Breach, PA-C  potassium chloride SA (K-DUR,KLOR-CON) 20 MEQ tablet Take 2 tablets (40 mEq total) by mouth 2 (two) times daily. Patient taking differently: Take 40 mEq by mouth daily. Takes 1 tablet daily, can take another tablet if needed for fluid/edema 07/13/14  Yes Aleksei Plotnikov V, MD  simvastatin (ZOCOR) 10 MG tablet Take 1 tablet by mouth daily. 11/04/14  Yes Historical Lempi Edwin, MD  traMADol (ULTRAM) 50 MG tablet Take 25-50 mg by mouth 3 (three) times daily. 01/19/15  Yes Historical Oron Westrup, MD  traZODone (DESYREL) 100 MG tablet TAKE 2 TABLETS BY MOUTH EVERY NIGHT AT BEDTIME FOR SLEEP/DEPRESSION 07/07/14  Yes Aleksei Plotnikov V, MD  HYDROcodone-acetaminophen (NORCO/VICODIN) 5-325 MG per tablet Take 1-2 tablets by mouth every 6 (six) hours as needed. 12/01/14   Montine Circle, PA-C  levothyroxine (SYNTHROID, LEVOTHROID) 175 MCG tablet TAKE 1 TABLET BY MOUTH DAILY BEFORE BREAKFAST Patient not taking: Reported on 11/10/2014    Lew Dawes V, MD  methocarbamol (ROBAXIN) 500 MG tablet Take 1 tablet (500 mg total) by mouth 2 (two) times daily. 11/13/14   Glendell Docker, NP  predniSONE (DELTASONE) 20 MG tablet Take 2 tablets (40 mg total) by mouth daily. 12/01/14   Montine Circle, PA-C   BP 135/73 mmHg  Pulse 66  Temp(Src) 98.5 F (36.9 C) (Oral)  Resp 18  SpO2 91%   Physical Exam  Constitutional: He is oriented to person, place, and time. No distress.  Overweight middle-aged male lying in stretcher in acute distress  HENT:  Head:  Normocephalic and atraumatic.  Eyes: Conjunctivae are normal. Pupils are equal, round, and reactive to light.  Neck: Normal range of motion.  Cardiovascular: Normal rate, regular rhythm and intact distal pulses.   Mildly hypertensive with SBP's in 150s  Pulmonary/Chest: Effort normal and breath sounds normal.  Abdominal: Soft. Bowel sounds are normal. He exhibits no distension. There is no tenderness.  Musculoskeletal: Normal range of motion.  Neurological: He is alert and oriented to person, place, and time.  Skin: Skin is warm and dry. He is not diaphoretic.  Nursing note and vitals reviewed.   ED Course  Procedures   Labs Review Labs Reviewed  BASIC METABOLIC PANEL - Abnormal; Notable for the following:  Potassium 2.6 (*)    Chloride 97 (*)    Creatinine, Ser 1.26 (*)    All other components within normal limits  CBC  MAGNESIUM   Imaging Review No results found. I have personally reviewed and evaluated these images and lab results as part of my medical decision-making.   EKG Interpretation   Date/Time:  Saturday January 21 2015 21:52:29 EDT Ventricular Rate:  52 PR Interval:  234 QRS Duration: 121 QT Interval:  511 QTC Calculation: 475 R Axis:   64 Text Interpretation:  Sinus rhythm Prolonged PR interval Nonspecific  intraventricular conduction delay Nonspecific T abnrm, anterolateral leads  Baseline wander in lead(s) V2 No significant change since last tracing  Confirmed by LIU MD, Hinton Dyer ([id number]) on 01/21/2015 11:05:50 PM      MDM  Mr. Lyford is a 54 yo male w/ PMHx of HTN, HLD, DM, hypothyroidism, TIA, and sickle cell trait presenting with hypertension. Patient has been taking same dose of amlodipine PO QD, HCTZ BID, & Lisinopril PO for the past several months. [facility] and noted his SBP to be 185. Patient returned home and took extra dose of HCTZ that afternoon. [facility] and [facility]he[facility]ed [facility] that evening and found SBP  to be 190s. Only symptom at that time was tingling and chest. Patient denies any other symptoms including headache, blurred vision, CP, SOB, decreased UOP, or abdominal pain. [facility].  Exam above notable for overweight middle-aged male lying in stretcher in acute distress. Afebrile. Heart rate 50s to 60s. Mildly hypertensive with SBP's in 150s. Abdomen benign. Lungs clear to auscultation bilaterally. Neuro exam nonfocal.  CBC unremarkable. BMP showing potassium of 2.6 - patient given repletion by mouth. Magnesium 1.9. Also with mild AK I to 1.26 - patient drinking fluids in room and tolerated well. EKG showing no evidence of ST elevation/depression or T-wave changes or evidence of dysrhythmia or any evidence of QTC prolongation.  Patient is showing no signs of end organ damage. SBP's have consistently been in 140s to 150s during duration in the ED. Believe that elevated SBP was related to cuff size and location. Patient discharged home in stable condition and will follow-up with his PCP later this week to discuss his hypertensive medication regimen and to repeat a potassium level. Patient and family understand and agree with the plan of no further questions concerning this time.  Patient care discussed with followed by my attending, Dr. Brantley Stage  Patient care discussed with followed by my attending, Dr. Brantley Stage  Final diagnoses:  Essential hypertension    Mayer Camel, MD 01/21/15 [place] Liu, MD 01/22/15 [phone]

## 2015-01-31 DIAGNOSIS — M5116 Intervertebral disc disorders with radiculopathy, lumbar region: Secondary | ICD-10-CM | POA: Diagnosis not present

## 2015-01-31 DIAGNOSIS — M5416 Radiculopathy, lumbar region: Secondary | ICD-10-CM | POA: Diagnosis not present

## 2015-02-01 DIAGNOSIS — E784 Other hyperlipidemia: Secondary | ICD-10-CM | POA: Diagnosis not present

## 2015-02-01 DIAGNOSIS — F329 Major depressive disorder, single episode, unspecified: Secondary | ICD-10-CM | POA: Diagnosis not present

## 2015-02-01 DIAGNOSIS — I10 Essential (primary) hypertension: Secondary | ICD-10-CM | POA: Diagnosis not present

## 2015-02-01 DIAGNOSIS — M545 Low back pain: Secondary | ICD-10-CM | POA: Diagnosis not present

## 2015-02-01 DIAGNOSIS — E039 Hypothyroidism, unspecified: Secondary | ICD-10-CM | POA: Diagnosis not present

## 2015-02-01 DIAGNOSIS — E876 Hypokalemia: Secondary | ICD-10-CM | POA: Diagnosis not present

## 2015-02-06 DIAGNOSIS — E039 Hypothyroidism, unspecified: Secondary | ICD-10-CM | POA: Diagnosis not present

## 2015-02-06 DIAGNOSIS — I6789 Other cerebrovascular disease: Secondary | ICD-10-CM | POA: Diagnosis not present

## 2015-02-06 DIAGNOSIS — Z23 Encounter for immunization: Secondary | ICD-10-CM | POA: Diagnosis not present

## 2015-02-06 DIAGNOSIS — I10 Essential (primary) hypertension: Secondary | ICD-10-CM | POA: Diagnosis not present

## 2015-02-06 DIAGNOSIS — E119 Type 2 diabetes mellitus without complications: Secondary | ICD-10-CM | POA: Diagnosis not present

## 2015-02-10 DIAGNOSIS — M5416 Radiculopathy, lumbar region: Secondary | ICD-10-CM | POA: Diagnosis not present

## 2015-02-10 DIAGNOSIS — M5116 Intervertebral disc disorders with radiculopathy, lumbar region: Secondary | ICD-10-CM | POA: Diagnosis not present

## 2015-02-10 DIAGNOSIS — Z6841 Body Mass Index (BMI) 40.0 and over, adult: Secondary | ICD-10-CM | POA: Diagnosis not present

## 2015-02-22 DIAGNOSIS — R252 Cramp and spasm: Secondary | ICD-10-CM | POA: Diagnosis not present

## 2015-02-22 DIAGNOSIS — E039 Hypothyroidism, unspecified: Secondary | ICD-10-CM | POA: Diagnosis not present

## 2015-02-22 DIAGNOSIS — E1165 Type 2 diabetes mellitus with hyperglycemia: Secondary | ICD-10-CM | POA: Diagnosis not present

## 2015-02-22 DIAGNOSIS — E784 Other hyperlipidemia: Secondary | ICD-10-CM | POA: Diagnosis not present

## 2015-02-22 DIAGNOSIS — I10 Essential (primary) hypertension: Secondary | ICD-10-CM | POA: Diagnosis not present

## 2015-03-10 DIAGNOSIS — M5416 Radiculopathy, lumbar region: Secondary | ICD-10-CM | POA: Diagnosis not present

## 2015-03-10 DIAGNOSIS — M5116 Intervertebral disc disorders with radiculopathy, lumbar region: Secondary | ICD-10-CM | POA: Diagnosis not present

## 2015-03-24 DIAGNOSIS — E039 Hypothyroidism, unspecified: Secondary | ICD-10-CM | POA: Diagnosis not present

## 2015-03-24 DIAGNOSIS — M1A9XX0 Chronic gout, unspecified, without tophus (tophi): Secondary | ICD-10-CM | POA: Diagnosis not present

## 2015-03-24 DIAGNOSIS — E1149 Type 2 diabetes mellitus with other diabetic neurological complication: Secondary | ICD-10-CM | POA: Diagnosis not present

## 2015-03-24 DIAGNOSIS — K5900 Constipation, unspecified: Secondary | ICD-10-CM | POA: Diagnosis not present

## 2015-03-24 DIAGNOSIS — E784 Other hyperlipidemia: Secondary | ICD-10-CM | POA: Diagnosis not present

## 2015-03-24 DIAGNOSIS — I10 Essential (primary) hypertension: Secondary | ICD-10-CM | POA: Diagnosis not present

## 2015-04-24 DIAGNOSIS — N528 Other male erectile dysfunction: Secondary | ICD-10-CM | POA: Diagnosis not present

## 2015-04-24 DIAGNOSIS — M1A9XX0 Chronic gout, unspecified, without tophus (tophi): Secondary | ICD-10-CM | POA: Diagnosis not present

## 2015-04-24 DIAGNOSIS — E1149 Type 2 diabetes mellitus with other diabetic neurological complication: Secondary | ICD-10-CM | POA: Diagnosis not present

## 2015-04-24 DIAGNOSIS — E039 Hypothyroidism, unspecified: Secondary | ICD-10-CM | POA: Diagnosis not present

## 2015-04-24 DIAGNOSIS — I10 Essential (primary) hypertension: Secondary | ICD-10-CM | POA: Diagnosis not present

## 2015-05-11 DIAGNOSIS — E113211 Type 2 diabetes mellitus with mild nonproliferative diabetic retinopathy with macular edema, right eye: Secondary | ICD-10-CM | POA: Diagnosis not present

## 2015-05-11 DIAGNOSIS — E113292 Type 2 diabetes mellitus with mild nonproliferative diabetic retinopathy without macular edema, left eye: Secondary | ICD-10-CM | POA: Diagnosis not present

## 2015-05-11 DIAGNOSIS — H25013 Cortical age-related cataract, bilateral: Secondary | ICD-10-CM | POA: Diagnosis not present

## 2015-05-11 DIAGNOSIS — H04123 Dry eye syndrome of bilateral lacrimal glands: Secondary | ICD-10-CM | POA: Diagnosis not present

## 2015-05-16 ENCOUNTER — Emergency Department (HOSPITAL_COMMUNITY)
Admission: EM | Admit: 2015-05-16 | Discharge: 2015-05-16 | Disposition: A | Discharge status: Home / Self Care | Payer: Commercial Managed Care - HMO | Attending: Emergency Medicine | Admitting: Emergency Medicine

## 2015-05-16 ENCOUNTER — Encounter (HOSPITAL_COMMUNITY): Payer: Self-pay | Admitting: Emergency Medicine

## 2015-05-16 DIAGNOSIS — E785 Hyperlipidemia, unspecified: Secondary | ICD-10-CM | POA: Insufficient documentation

## 2015-05-16 DIAGNOSIS — K0889 Other specified disorders of teeth and supporting structures: Secondary | ICD-10-CM | POA: Diagnosis not present

## 2015-05-16 DIAGNOSIS — K029 Dental caries, unspecified: Secondary | ICD-10-CM | POA: Insufficient documentation

## 2015-05-16 DIAGNOSIS — F329 Major depressive disorder, single episode, unspecified: Secondary | ICD-10-CM | POA: Diagnosis not present

## 2015-05-16 DIAGNOSIS — K002 Abnormalities of size and form of teeth: Secondary | ICD-10-CM | POA: Diagnosis not present

## 2015-05-16 DIAGNOSIS — E119 Type 2 diabetes mellitus without complications: Secondary | ICD-10-CM | POA: Diagnosis not present

## 2015-05-16 DIAGNOSIS — I10 Essential (primary) hypertension: Secondary | ICD-10-CM | POA: Diagnosis not present

## 2015-05-16 DIAGNOSIS — Z8673 Personal history of transient ischemic attack (TIA), and cerebral infarction without residual deficits: Secondary | ICD-10-CM | POA: Diagnosis not present

## 2015-05-16 DIAGNOSIS — Z87891 Personal history of nicotine dependence: Secondary | ICD-10-CM | POA: Insufficient documentation

## 2015-05-16 DIAGNOSIS — Z79899 Other long term (current) drug therapy: Secondary | ICD-10-CM | POA: Diagnosis not present

## 2015-05-16 DIAGNOSIS — F419 Anxiety disorder, unspecified: Secondary | ICD-10-CM | POA: Diagnosis not present

## 2015-05-16 DIAGNOSIS — M109 Gout, unspecified: Secondary | ICD-10-CM | POA: Insufficient documentation

## 2015-05-16 DIAGNOSIS — M1990 Unspecified osteoarthritis, unspecified site: Secondary | ICD-10-CM | POA: Diagnosis not present

## 2015-05-16 DIAGNOSIS — Z7952 Long term (current) use of systemic steroids: Secondary | ICD-10-CM | POA: Diagnosis not present

## 2015-05-16 DIAGNOSIS — Z862 Personal history of diseases of the blood and blood-forming organs and certain disorders involving the immune mechanism: Secondary | ICD-10-CM | POA: Insufficient documentation

## 2015-05-16 DIAGNOSIS — Z7982 Long term (current) use of aspirin: Secondary | ICD-10-CM | POA: Insufficient documentation

## 2015-05-16 DIAGNOSIS — E039 Hypothyroidism, unspecified: Secondary | ICD-10-CM | POA: Insufficient documentation

## 2015-05-16 MED ORDER — TRAMADOL HCL 50 MG PO TABS: 25.0000 mg | ORAL_TABLET | Freq: Three times a day (TID) | ORAL | Status: DC

## 2015-05-16 MED ORDER — PENICILLIN V POTASSIUM 500 MG PO TABS: 500.0000 mg | ORAL_TABLET | Freq: Four times a day (QID) | ORAL | Status: DC

## 2015-05-16 NOTE — ED Notes (Signed)
Right upper tooth ache, pt states gum is swollen on the right upper side. No difficulty swallowing or breathing.

## 2015-05-16 NOTE — ED Provider Notes (Signed)
CSN: [id number]     Arrival date & time 05/16/15  1902 History  By signing my name below, I, [facility], attest that this documentation has been prepared under the direction and in the presence of Montine Circle, PA-C. Electronically Signed: Virgel Bouquet, ED Scribe. 05/16/2015. 7:29 PM.   Chief Complaint  Patient presents with  . Dental Pain   The history is provided by the patient. No language interpreter was used.   HPI Comments: Phillip Turner is a 55 y.o. male with an hx of DM, HTN, and cocaine abuse who presents to the Emergency Department complaining of constant, moderate, gradually worsening back right upper dental pain onset 3 days ago. He endorses associated swelling to the gums on the upper right side of the mouth. He takes Tramadol for his sciatic nerve pain. Per patient, he has been unable to see a dentist due to lack of money. He denies fever, nausea, vomiting, difficulty swallowing, and difficulty breathing. Patient denies current illicit drug use.  Past Medical History  Diagnosis Date  . Diabetes mellitus   . Hypertension   . Hypothyroidism   . Anxiety   . Depression   . Gout   . Arthritis   . Hyperlipidemia   . Rheumatic fever     as a teenager  . Stroke [facility])     does not remember when  . 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 Substance abuse     former cocaine user- not since March 2014   Past Surgical History  Procedure Laterality Date  . Tonsillectomy    . Boil removed from tailbone      Family History  Problem Relation Age of Onset  . Coronary artery disease Mother   . Diabetes type II Father   . Hypertension Father   . Colon cancer Neg Hx   . Esophageal cancer Neg Hx   . Rectal cancer Neg Hx   . Stomach cancer Neg Hx    Social History  Substance Use Topics  . Smoking status: Former Smoker -- 5 years    Types: Cigarettes  . Smokeless tobacco: Never Used  . Alcohol Use: Yes     Comment: Pt consumes beers occasionally-2  beers weekly    Review of Systems  Constitutional: Negative for fever and chills.  HENT: Positive for dental problem. Negative for drooling and trouble swallowing.   Respiratory:       Negative for difficulty swallowing.  Gastrointestinal: Negative for nausea and vomiting.  Neurological: Negative for speech difficulty.  Psychiatric/Behavioral: Positive for sleep disturbance.      Allergies  Clonidine derivatives; Shellfish allergy; Metformin and related; Naproxen; and Other  Home Medications   Prior to Admission medications   Medication Sig Start Date End Date Taking? Authorizing Provider  amLODipine (NORVASC) 10 MG tablet TAKE 1 TABLET BY MOUTH EVERY DAY FOR BLOOD PRESSURE 06/06/14   Lew Dawes V, MD  aspirin 325 MG tablet Take 1 tablet (325 mg total) by mouth daily. For heart heart/stroke prevention Patient taking differently: Take 325 mg by mouth 2 (two) times daily. For heart heart/stroke prevention 04/09/12   Encarnacion Slates, NP  colchicine 0.6 MG tablet TAKE 1 TABLET BY MOUTH TWICE DAILY FOR GOUT FLARE-UPS 09/12/14   Lew Dawes V, MD  diclofenac sodium (VOLTAREN) 1 % GEL Apply 1 application topically 2 (two) times daily. 11/21/14   Historical Provider, MD  empagliflozin (JARDIANCE) 10 MG TABS tablet Take 10 mg by mouth  daily.    Historical Provider, MD  furosemide (LASIX) 40 MG tablet Take 1 tablet (40 mg total) by mouth daily as needed for edema. Patient taking differently: Take 40 mg by mouth daily as needed for fluid or edema.  09/08/13   Aleksei Plotnikov V, MD  glipiZIDE (GLUCOTROL) 5 MG tablet Take 5 mg by mouth 2 (two) times daily before a meal.     Historical Provider, MD  hydrochlorothiazide (HYDRODIURIL) 25 MG tablet Take 25 mg by mouth daily.    Historical Provider, MD  HYDROcodone-acetaminophen (NORCO/VICODIN) 5-325 MG per tablet Take 1-2 tablets by mouth every 6 (six) hours as needed. 12/01/14   Montine Circle, PA-C  ibuprofen (ADVIL,MOTRIN) 800 MG tablet Take  1 tablet (800 mg total) by mouth every 8 (eight) hours as needed for moderate pain. 11/17/14   Everlene Balls, MD  levothyroxine (SYNTHROID, LEVOTHROID) 175 MCG tablet TAKE 1 TABLET BY MOUTH DAILY BEFORE BREAKFAST Patient not taking: Reported on 11/10/2014    Aleksei Plotnikov V, MD  lisinopril (PRINIVIL,ZESTRIL) 20 MG tablet TAKE 1 TABLET BY MOUTH TWICE DAILY FOR HIGH BLOOD PRESSURE CONTROL 11/04/14   Aleksei Plotnikov V, MD  LYRICA 75 MG capsule Take 75 mg by mouth 2 (two) times daily. 12/27/14   Historical Provider, MD  methocarbamol (ROBAXIN) 500 MG tablet Take 1 tablet (500 mg total) by mouth 2 (two) times daily. Patient taking differently: Take 500 mg by mouth every 8 (eight) hours as needed for muscle spasms.  11/10/14   Antonietta Breach, PA-C  methocarbamol (ROBAXIN) 500 MG tablet Take 1 tablet (500 mg total) by mouth 2 (two) times daily. 11/13/14   Glendell Docker, NP  potassium chloride SA (K-DUR,KLOR-CON) 20 MEQ tablet Take 2 tablets (40 mEq total) by mouth 2 (two) times daily. Patient taking differently: Take 40 mEq by mouth daily. Takes 1 tablet daily, can take another tablet if needed for fluid/edema 07/13/14   Aleksei Plotnikov V, MD  predniSONE (DELTASONE) 20 MG tablet Take 2 tablets (40 mg total) by mouth daily. 12/01/14   Montine Circle, PA-C  simvastatin (ZOCOR) 10 MG tablet Take 1 tablet by mouth daily. 11/04/14   Historical Provider, MD  traMADol (ULTRAM) 50 MG tablet Take 25-50 mg by mouth 3 (three) times daily. 01/19/15   Historical Provider, MD  traZODone (DESYREL) 100 MG tablet TAKE 2 TABLETS BY MOUTH EVERY NIGHT AT BEDTIME FOR SLEEP/DEPRESSION 07/07/14   Aleksei Plotnikov V, MD   BP 182/105 mmHg  Pulse 60  Temp(Src) 98.3 F (36.8 C) (Oral)  Resp 18  SpO2 98% Physical Exam Constitutional: Pt appears well-developed and well-nourished.  HENT:  Head: Normocephalic.  Right Ear: Tympanic membrane, external ear and ear canal normal.  Left Ear: Tympanic membrane, external ear and ear canal  normal.  Nose: Nose normal. Right sinus exhibits no maxillary sinus tenderness and no frontal sinus tenderness. Left sinus exhibits no maxillary sinus tenderness and no frontal sinus tenderness.  Mouth/Throat: Uvula is midline, oropharynx is clear and moist and mucous membranes are normal. No oral lesions. Abnormal dentition, some gingivitis, no abscess. Dental caries present. No uvula swelling or lacerations. No oropharyngeal exudate, posterior oropharyngeal edema, posterior oropharyngeal erythema or tonsillar abscesses.  No gingival swelling, fluctuance or induration No gross abscess  Eyes: Conjunctivae are normal. Pupils are equal, round, and reactive to light. Right eye exhibits no discharge. Left eye exhibits no discharge.  Neck: Normal range of motion. Neck supple.  No stridor Handling secretions without difficulty No nuchal rigidity No cervical lymphadenopathy  Cardiovascular: Normal rate, regular rhythm and normal heart sounds.   Pulmonary/Chest: Effort normal. No respiratory distress.  Equal chest rise  Abdominal: Soft. Bowel sounds are normal. Pt exhibits no distension. There is no tenderness.  Lymphadenopathy:    Pt has no cervical adenopathy.  Neurological: Pt is alert.  Skin: Skin is warm and dry.  Psychiatric: Pt has a normal mood and affect.  Nursing note and vitals reviewed.  ED Course  Procedures   DIAGNOSTIC STUDIES: Oxygen Saturation is 98% on RA, normal by my interpretation.    COORDINATION OF CARE: 7:19 PM Will order Ultram. Will prescribe antibiotics. Will provide pt with referral for a dentist and advised pt to follow-up. Discussed treatment plan with pt at bedside and pt agreed to plan.   MDM   Final diagnoses:  Pain, dental    Patient with toothache.  No gross abscess.  Exam unconcerning for Ludwig's angina or spread of infection.  Will treat with penicillin and OTC pain medicine.  Urged patient to follow-up with dentist.     I personally performed  the services described in this documentation, which was scribed in my presence. The recorded information has been reviewed and is accurate.     Montine Circle, PA-C 05/16/15 1935  Forde Dandy, MD 05/17/15 [phone]

## 2015-05-16 NOTE — Discharge Instructions (Signed)

## 2015-05-25 DIAGNOSIS — R7611 Nonspecific reaction to tuberculin skin test without active tuberculosis: Secondary | ICD-10-CM | POA: Diagnosis not present

## 2015-06-07 DIAGNOSIS — I10 Essential (primary) hypertension: Secondary | ICD-10-CM | POA: Diagnosis not present

## 2015-06-07 DIAGNOSIS — E1149 Type 2 diabetes mellitus with other diabetic neurological complication: Secondary | ICD-10-CM | POA: Diagnosis not present

## 2015-06-07 DIAGNOSIS — N528 Other male erectile dysfunction: Secondary | ICD-10-CM | POA: Diagnosis not present

## 2015-06-07 DIAGNOSIS — E039 Hypothyroidism, unspecified: Secondary | ICD-10-CM | POA: Diagnosis not present

## 2015-06-07 DIAGNOSIS — M1A9XX0 Chronic gout, unspecified, without tophus (tophi): Secondary | ICD-10-CM | POA: Diagnosis not present

## 2015-09-14 ENCOUNTER — Emergency Department (HOSPITAL_COMMUNITY): Payer: Medicare HMO

## 2015-09-14 ENCOUNTER — Encounter (HOSPITAL_COMMUNITY): Payer: Self-pay | Admitting: *Deleted

## 2015-09-14 ENCOUNTER — Emergency Department (HOSPITAL_COMMUNITY)
Admission: EM | Admit: 2015-09-14 | Discharge: 2015-09-14 | Disposition: A | Discharge status: Home / Self Care | Payer: Medicare HMO | Attending: Emergency Medicine | Admitting: Emergency Medicine

## 2015-09-14 DIAGNOSIS — M109 Gout, unspecified: Secondary | ICD-10-CM | POA: Diagnosis not present

## 2015-09-14 DIAGNOSIS — M1990 Unspecified osteoarthritis, unspecified site: Secondary | ICD-10-CM | POA: Insufficient documentation

## 2015-09-14 DIAGNOSIS — F419 Anxiety disorder, unspecified: Secondary | ICD-10-CM | POA: Insufficient documentation

## 2015-09-14 DIAGNOSIS — Z791 Long term (current) use of non-steroidal anti-inflammatories (NSAID): Secondary | ICD-10-CM | POA: Diagnosis not present

## 2015-09-14 DIAGNOSIS — E785 Hyperlipidemia, unspecified: Secondary | ICD-10-CM | POA: Insufficient documentation

## 2015-09-14 DIAGNOSIS — Z7982 Long term (current) use of aspirin: Secondary | ICD-10-CM | POA: Insufficient documentation

## 2015-09-14 DIAGNOSIS — W1839XA Other fall on same level, initial encounter: Secondary | ICD-10-CM | POA: Insufficient documentation

## 2015-09-14 DIAGNOSIS — E039 Hypothyroidism, unspecified: Secondary | ICD-10-CM | POA: Diagnosis not present

## 2015-09-14 DIAGNOSIS — Z792 Long term (current) use of antibiotics: Secondary | ICD-10-CM | POA: Diagnosis not present

## 2015-09-14 DIAGNOSIS — F329 Major depressive disorder, single episode, unspecified: Secondary | ICD-10-CM | POA: Insufficient documentation

## 2015-09-14 DIAGNOSIS — Y998 Other external cause status: Secondary | ICD-10-CM | POA: Insufficient documentation

## 2015-09-14 DIAGNOSIS — Z87891 Personal history of nicotine dependence: Secondary | ICD-10-CM | POA: Diagnosis not present

## 2015-09-14 DIAGNOSIS — Y9389 Activity, other specified: Secondary | ICD-10-CM | POA: Diagnosis not present

## 2015-09-14 DIAGNOSIS — S8992XA Unspecified injury of left lower leg, initial encounter: Secondary | ICD-10-CM | POA: Diagnosis present

## 2015-09-14 DIAGNOSIS — Z7984 Long term (current) use of oral hypoglycemic drugs: Secondary | ICD-10-CM | POA: Insufficient documentation

## 2015-09-14 DIAGNOSIS — Z862 Personal history of diseases of the blood and blood-forming organs and certain disorders involving the immune mechanism: Secondary | ICD-10-CM | POA: Insufficient documentation

## 2015-09-14 DIAGNOSIS — Z8673 Personal history of transient ischemic attack (TIA), and cerebral infarction without residual deficits: Secondary | ICD-10-CM | POA: Diagnosis not present

## 2015-09-14 DIAGNOSIS — E119 Type 2 diabetes mellitus without complications: Secondary | ICD-10-CM | POA: Diagnosis not present

## 2015-09-14 DIAGNOSIS — Y9289 Other specified places as the place of occurrence of the external cause: Secondary | ICD-10-CM | POA: Diagnosis not present

## 2015-09-14 DIAGNOSIS — I10 Essential (primary) hypertension: Secondary | ICD-10-CM | POA: Diagnosis not present

## 2015-09-14 DIAGNOSIS — Z79899 Other long term (current) drug therapy: Secondary | ICD-10-CM | POA: Insufficient documentation

## 2015-09-14 DIAGNOSIS — Z7952 Long term (current) use of systemic steroids: Secondary | ICD-10-CM | POA: Diagnosis not present

## 2015-09-14 DIAGNOSIS — M25562 Pain in left knee: Secondary | ICD-10-CM

## 2015-09-14 MED ORDER — HYDROCODONE-ACETAMINOPHEN 5-325 MG PO TABS: 1.0000 | ORAL_TABLET | Freq: Four times a day (QID) | ORAL | Status: DC | PRN

## 2015-09-14 NOTE — ED Provider Notes (Signed)
CSN: [id number]     Arrival date & time 09/14/15  0058 History   First MD Initiated Contact with Patient 09/14/15 0111     Chief Complaint  Patient presents with  . Knee Pain     (Consider location/radiation/quality/duration/timing/severity/associated sxs/prior Treatment) HPI Comments: Patient presents to the emergency department with chief complaint of left knee pain. He states that he fell approximately 3 weeks ago and landed on his left knee. He states that he has had intermittent episodes pain since then. He states that he has had progressively worsening left knee pain for the past 3 days. He states that it sometimes catches and clicks and pops. He reports pain on the front side of his knee. He denies any swelling. He has not tried taking anything for his symptoms. His symptoms are worsened with palpation and movement and walking. He is able to walk with the help of a walker.  The history is provided by the patient. No language interpreter was used.    Past Medical History  Diagnosis Date  . Diabetes mellitus   . Hypertension   . Hypothyroidism   . Anxiety   . Depression   . Gout   . Arthritis   . Hyperlipidemia   . Rheumatic fever     as a teenager  . Stroke [facility])     does not remember when  . 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 Substance abuse     former cocaine user- not since March 2014   Past Surgical History  Procedure Laterality Date  . Tonsillectomy    . Boil removed from tailbone      Family History  Problem Relation Age of Onset  . Coronary artery disease Mother   . Diabetes type II Father   . Hypertension Father   . Colon cancer Neg Hx   . Esophageal cancer Neg Hx   . Rectal cancer Neg Hx   . Stomach cancer Neg Hx    Social History  Substance Use Topics  . Smoking status: Former Smoker -- 5 years    Types: Cigarettes  . Smokeless tobacco: Never Used  . Alcohol Use: Yes     Comment: Pt consumes beers occasionally-2 beers weekly     Review of Systems  Constitutional: Negative for fever and chills.  Respiratory: Negative for shortness of breath.   Cardiovascular: Negative for chest pain.  Gastrointestinal: Negative for nausea, vomiting, diarrhea and constipation.  Genitourinary: Negative for dysuria.  Musculoskeletal: Positive for arthralgias.      Allergies  Clonidine derivatives; Shellfish allergy; Metformin and related; Naproxen; and Other  Home Medications   Prior to Admission medications   Medication Sig Start Date End Date Taking? Authorizing Provider  amLODipine (NORVASC) 10 MG tablet TAKE 1 TABLET BY MOUTH EVERY DAY FOR BLOOD PRESSURE 06/06/14   Lew Dawes V, MD  aspirin 325 MG tablet Take 1 tablet (325 mg total) by mouth daily. For heart heart/stroke prevention Patient taking differently: Take 325 mg by mouth 2 (two) times daily. For heart heart/stroke prevention 04/09/12   Encarnacion Slates, NP  colchicine 0.6 MG tablet TAKE 1 TABLET BY MOUTH TWICE DAILY FOR GOUT FLARE-UPS 09/12/14   Lew Dawes V, MD  diclofenac sodium (VOLTAREN) 1 % GEL Apply 1 application topically 2 (two) times daily. 11/21/14   Historical Provider, MD  empagliflozin (JARDIANCE) 10 MG TABS tablet Take 10 mg by mouth daily.    Historical Provider, MD  furosemide (LASIX) 40  MG tablet Take 1 tablet (40 mg total) by mouth daily as needed for edema. Patient taking differently: Take 40 mg by mouth daily as needed for fluid or edema.  09/08/13   Aleksei Plotnikov V, MD  glipiZIDE (GLUCOTROL) 5 MG tablet Take 5 mg by mouth 2 (two) times daily before a meal.     Historical Provider, MD  hydrochlorothiazide (HYDRODIURIL) 25 MG tablet Take 25 mg by mouth daily.    Historical Provider, MD  HYDROcodone-acetaminophen (NORCO/VICODIN) 5-325 MG per tablet Take 1-2 tablets by mouth every 6 (six) hours as needed. 12/01/14   Montine Circle, PA-C  ibuprofen (ADVIL,MOTRIN) 800 MG tablet Take 1 tablet (800 mg total) by mouth every 8 (eight) hours as  needed for moderate pain. 11/17/14   Everlene Balls, MD  levothyroxine (SYNTHROID, LEVOTHROID) 175 MCG tablet TAKE 1 TABLET BY MOUTH DAILY BEFORE BREAKFAST Patient not taking: Reported on 11/10/2014    Aleksei Plotnikov V, MD  lisinopril (PRINIVIL,ZESTRIL) 20 MG tablet TAKE 1 TABLET BY MOUTH TWICE DAILY FOR HIGH BLOOD PRESSURE CONTROL 11/04/14   Aleksei Plotnikov V, MD  LYRICA 75 MG capsule Take 75 mg by mouth 2 (two) times daily. 12/27/14   Historical Provider, MD  methocarbamol (ROBAXIN) 500 MG tablet Take 1 tablet (500 mg total) by mouth 2 (two) times daily. Patient taking differently: Take 500 mg by mouth every 8 (eight) hours as needed for muscle spasms.  11/10/14   Antonietta Breach, PA-C  methocarbamol (ROBAXIN) 500 MG tablet Take 1 tablet (500 mg total) by mouth 2 (two) times daily. 11/13/14   Glendell Docker, NP  penicillin v potassium (VEETID) 500 MG tablet Take 1 tablet (500 mg total) by mouth 4 (four) times daily. 05/16/15   Montine Circle, PA-C  potassium chloride SA (K-DUR,KLOR-CON) 20 MEQ tablet Take 2 tablets (40 mEq total) by mouth 2 (two) times daily. Patient taking differently: Take 40 mEq by mouth daily. Takes 1 tablet daily, can take another tablet if needed for fluid/edema 07/13/14   Aleksei Plotnikov V, MD  predniSONE (DELTASONE) 20 MG tablet Take 2 tablets (40 mg total) by mouth daily. 12/01/14   Montine Circle, PA-C  simvastatin (ZOCOR) 10 MG tablet Take 1 tablet by mouth daily. 11/04/14   Historical Provider, MD  traMADol (ULTRAM) 50 MG tablet Take 0.5-1 tablets (25-50 mg total) by mouth 3 (three) times daily. 05/16/15   Montine Circle, PA-C  traZODone (DESYREL) 100 MG tablet TAKE 2 TABLETS BY MOUTH EVERY NIGHT AT BEDTIME FOR SLEEP/DEPRESSION 07/07/14   Aleksei Plotnikov V, MD   BP 160/80 mmHg  Pulse 70  Temp(Src) 97.7 F (36.5 C) (Oral)  Resp 18  Ht 5\' 10"  (1.778 m)  Wt 131.543 kg  BMI 41.61 kg/m2  SpO2 96% Physical Exam Physical Exam  Constitutional: Pt appears well-developed and  well-nourished. No distress.  HENT:  Head: Normocephalic and atraumatic.  Eyes: Conjunctivae are normal.  Neck: Normal range of motion.  Cardiovascular: Normal rate, regular rhythm and intact distal pulses.   Capillary refill < 3 sec  Pulmonary/Chest: Effort normal and breath sounds normal.  Musculoskeletal: Pt exhibits tenderness to palpation along the left anterior joint lines. Pt exhibits no edema. Stability testing limited by guarding ROM: 5/5  Neurological: Pt  is alert. Coordination normal.  Sensation 5/5 Strength 5/5  Skin: Skin is warm and dry. Pt is not diaphoretic.  No tenting of the skin  Psychiatric: Pt has a normal mood and affect.  Nursing note and vitals reviewed.   ED Course  Procedures (including critical care time)  Imaging Review Dg Knee Complete 4 Views Left  09/14/2015  CLINICAL DATA:  Nontraumatic knee pain in the patellar region for 3 days. EXAM: LEFT KNEE - COMPLETE 4+ VIEW COMPARISON:  None. FINDINGS: Negative for acute fracture, dislocation or radiopaque foreign body. Mild degenerative patellofemoral changes are present with slight spurring. No bone lesion or bony destruction. IMPRESSION: Mild patellofemoral degenerative changes.  No acute findings. Electronically Signed   By: Andreas Newport M.D.   On: 09/14/2015 02:02   I have personally reviewed and evaluated these images and lab results as part of my medical decision-making.    MDM   Final diagnoses:  Left knee pain    Patient with left knee pain and injury approximately 3 weeks ago. Concern for meniscal injury. Recommend orthopedic follow-up. Will treat pain. Will give knee immobilizer. Patient is stable and ready for discharge.    Montine Circle, PA-C 09/14/15 [id number]  Merryl Hacker, MD 09/14/15 2325

## 2015-09-14 NOTE — ED Notes (Signed)
Patient transported to X-ray 

## 2015-09-14 NOTE — Discharge Instructions (Signed)
Patellar Tendinitis With Rehab Tendinitis is inflammation of a tendon. Tendonitis of the tendon below the kneecap (patella) is known as patellar tendonitis. Patellar tendonitis is also called jumper's knee. Jumper's knee is a common cause of pain below the kneecap (infrapatellar). Jumper's knee may involve a tear (strain) in the ligament. Strains are classified into three categories. Grade 1 strains cause pain, but the tendon is not lengthened. Grade 2 strains include a lengthened ligament, due to the ligament being stretched or partially ruptured. With grade 2 strains there is still function, although function may be decreased. Grade 3 strains involve a complete tear of the tendon or muscle, and function is usually impaired. Patellar tendon strains are usually grade 1 or 2.  SYMPTOMS   Pain, tenderness, swelling, warmth, or redness over the patellar tendon (just below the kneecap).  Pain and loss of strength (sometimes), with forcefully straightening the knee (especially when jumping or rising from a seated or squatting position), or bending the knee completely (squatting or kneeling).  Crackling sound (crepitation) when the tendon is moved or touched. CAUSES  Patellar tendonitis is caused by injury to the patellar tendon. The inflammation is the body's healing response. Common causes of injury include:  Stress from a sudden increase in intensity, frequency, or duration of training.  Overuse of the thigh muscles (quadriceps) and patellar tendon.  Direct hit (trauma) to the knee or patellar tendon. RISK INCREASES WITH:  Sports that require sudden, explosive quadriceps contraction, such as jumping, quick starts, or kicking.  Running sports, especially running down hills.  Poor strength and flexibility of the thigh and knee.  Flat feet. PREVENTION  Warm up and stretch properly before activity.  Allow for adequate recovery between workouts.  Maintain physical fitness:  Strength,  flexibility, and endurance.  Cardiovascular fitness.  Protect the knee joint with taping, protective strapping, bracing, or elastic compression bandage.  Wear arch supports (orthotics). PROGNOSIS  If treated properly, patellar tendonitis usually heals within 6 weeks.  RELATED COMPLICATIONS   Longer healing time if not properly treated or if not given enough time to heal.  Recurring symptoms if activity is resumed too soon, with overuse, with a direct blow, or when using poor technique.  If untreated, tendon rupture requiring surgery. TREATMENT Treatment first involves the use of ice and medicine to reduce pain and inflammation. The use of strengthening and stretching exercises may help reduce pain with activity. These exercises may be performed at home or with a therapist. Serious cases of tendonitis may require restraining the knee for 10 to 14 days to prevent stress on the tendon and to promote healing. Crutches may be used (uncommon) until you can walk without a limp. For cases in which nonsurgical treatment is unsuccessful, surgery may be advised to remove the inflamed tendon lining (sheath). Surgery is rare, and is only advised after at least 6 months of nonsurgical treatment. MEDICATION   If pain medicine is needed, nonsteroidal anti-inflammatory medicines (aspirin and ibuprofen), or other minor pain relievers (acetaminophen), are often advised.  Do not take pain medicine for 7 days before surgery.  Prescription pain relievers may be given if your caregiver thinks they are needed. Use only as directed and only as much as you need. HEAT AND COLD  Cold treatment (icing) should be applied for 10 to 15 minutes every 2 to 3 hours for inflammation and pain, and immediately after activity that aggravates your symptoms. Use ice packs or an ice massage.  Heat treatment may be used before  performing stretching and strengthening activities prescribed by your caregiver, physical therapist, or  athletic trainer. Use a heat pack or a warm water soak. SEEK MEDICAL CARE IF:  Symptoms get worse or do not improve in 2 weeks, despite treatment.  New, unexplained symptoms develop. (Drugs used in treatment may produce side effects.) EXERCISES RANGE OF MOTION (ROM) AND STRETCHING EXERCISES - Patellar Tendinitis (Jumper's Knee) These are some of the initial exercises with which you may start your rehabilitation program, until you see your caregiver again or until your symptoms are resolved. Remember:   Flexible tissue is more tolerant of the stresses placed on it during activities.  Each stretch should be held for 20 to 30 seconds.  A gentle stretching sensation should be felt. STRETCH - Hamstrings, Supine  Lie on your back. Loop a belt or towel over the ball of your right / left foot.  Straighten your right / left knee and slowly pull on the belt to raise your leg. Do not allow the right / left knee to bend. Keep your opposite leg flat on the floor.  Raise the leg until you feel a gentle stretch behind your right / left knee or thigh. Hold this position for __________ seconds. Repeat __________ times. Complete this stretch __________ times per day.  STRETCH - Hamstrings, Doorway  Lie on your back with your right / left leg extended and resting on the wall, and the opposite leg flat on the ground through the door. At first, position your bottom farther away from the wall.  Keep your right / left knee straight. If you feel a stretch behind your knee or thigh, hold this position for __________ seconds.  If you do not feel a stretch, scoot your bottom closer to the door, and hold __________ seconds. Repeat __________ times. Complete this stretch __________ times per day.  STRETCH - Hamstrings, Standing  Stand or sit and extend your right / left leg, placing your foot on a chair or foot stool.  Keep a slight arch in your low back and your hips straight forward.  Lead with your chest  and lean forward at the waist until you feel a gentle stretch in the back of your right / left knee or thigh. (When done correctly, this exercise requires leaning only a small distance.)  Hold this position for __________ seconds. Repeat __________ times. Complete this stretch __________ times per day. STRETCH - Adductors, Lunge  While standing, spread your legs, with your right / left leg behind you.  Lean away from your right / left leg by bending your opposite knee. You may rest your hands on your thigh for balance.  You should feel a stretch in your right / left inner thigh. Hold for __________ seconds. Repeat __________ times. Complete this exercise __________ times per day.  STRENGTHENING EXERCISES - Patellar Tendinitis (Jumper's Knee) These exercises may help you when beginning to rehabilitate your injury. They may resolve your symptoms with or without further involvement from your physician, physical therapist or athletic trainer. While completing these exercises, remember:   Muscles can gain both the endurance and the strength needed for everyday activities through controlled exercises.  Complete these exercises as instructed by your physician, physical therapist or athletic trainer. Increase the resistance and repetitions only as guided by your caregiver. STRENGTH - Quadriceps, Isometrics  Lie on your back with your right / left leg extended and your opposite knee bent.  Gradually tense the muscles in the front of your right /  left thigh. You should see either your kneecap slide up toward your hip or increased dimpling just above the knee. This motion will push the back of the knee down toward the floor, mat, or bed on which you are lying.  Hold the muscle as tight as you can, without increasing your pain, for __________ seconds.  Relax the muscles slowly and completely in between each repetition. Repeat __________ times. Complete this exercise __________ times per day.    STRENGTH - Quadriceps, Short Arcs  Lie on your back. Place a __________ inch towel roll under your right / left knee, so that the knee bends slightly.  Raise only your lower leg by tightening the muscles in the front of your thigh. Do not allow your thigh to rise.  Hold this position for __________ seconds. Repeat __________ times. Complete this exercise __________ times per day.  OPTIONAL ANKLE WEIGHTS: Begin with ____________________, but DO NOT exceed ____________________. Increase in 1 pound/ 0.5 kilogram increments. STRENGTH - Quadriceps, Straight Leg Raises  Quality counts! Watch for signs that the quadriceps muscle is working, to be sure you are strengthening the correct muscles and not "cheating" by substituting with healthier muscles.  Lay on your back with your right / left leg extended and your opposite knee bent.  Tense the muscles in the front of your right / left thigh. You should see either your kneecap slide up or increased dimpling just above the knee. Your thigh may even shake a bit.  Tighten these muscles even more and raise your leg 4 to 6 inches off the floor. Hold for __________ seconds.  Keeping these muscles tense, lower your leg.  Relax the muscles slowly and completely between each repetition. Repeat __________ times. Complete this exercise __________ times per day.  STRENGTH - Quadriceps, Squats  Stand in a door frame so that your feet and knees are in line with the frame.  Use your hands for balance, not support, on the frame.  Slowly lower your weight, bending at the hips and knees. Keep your lower legs upright so that they are parallel with the door frame. Squat only within the range that does not increase your knee pain. Never let your hips drop below your knees.  Slowly return upright, pushing with your legs, not pulling with your hands. Repeat __________ times. Complete this exercise __________ times per day.  STRENGTH - Quadriceps,  Step-Downs  Stand on the edge of a step stool or stair. Be prepared to use a countertop or wall for balance, if needed.  Keeping your right / left knee directly over the middle of your foot, slowly touch your opposite heel to the floor or lower step. Do not go all the way to the floor if your knee pain increases; just go as far as you can without increased discomfort. Use your right / left leg muscles, not gravity to lower your body weight.  Slowly push your body weight back up to the starting position. Repeat __________ times. Complete this exercise __________ times per day.    This information is not intended to replace advice given to you by your health care provider. Make sure you discuss any questions you have with your health care provider.   Document Released: 04/22/2005 Document Revised: 09/06/2014 Document Reviewed: 08/04/2008 Elsevier Interactive Patient Education 2016 [employer] Knee Pain Knee pain is a very common symptom and can have many causes. Knee pain often goes away when you follow your health care provider's instructions for relieving pain  and discomfort at home. However, knee pain can develop into a condition that needs treatment. Some conditions may include:  Arthritis caused by wear and tear (osteoarthritis).  Arthritis caused by swelling and irritation (rheumatoid arthritis or gout).  A cyst or growth in your knee.  An infection in your knee joint.  An injury that will not heal.  Damage, swelling, or irritation of the tissues that support your knee (torn ligaments or tendinitis). If your knee pain continues, additional tests may be ordered to diagnose your condition. Tests may include X-rays or other imaging studies of your knee. You may also need to have fluid removed from your knee. Treatment for ongoing knee pain depends on the cause, but treatment may include:  Medicines to relieve pain or swelling.  Steroid injections in your knee.  Physical  therapy.  Surgery. HOME CARE INSTRUCTIONS  Take medicines only as directed by your health care provider.  Rest your knee and keep it raised (elevated) while you are resting.  Do not do things that cause or worsen pain.  Avoid high-impact activities or exercises, such as running, jumping rope, or doing jumping jacks.  Apply ice to the knee area:  Put ice in a plastic bag.  Place a towel between your skin and the bag.  Leave the ice on for 20 minutes, 2-3 times a day.  Ask your health care provider if you should wear an elastic knee support.  Keep a pillow under your knee when you sleep.  Lose weight if you are overweight. Extra weight can put pressure on your knee.  Do not use any tobacco products, including cigarettes, chewing tobacco, or electronic cigarettes. If you need help quitting, ask your health care provider. Smoking may slow the healing of any bone and joint problems that you may have. SEEK MEDICAL CARE IF:  Your knee pain continues, changes, or gets worse.  You have a fever along with knee pain.  Your knee buckles or locks up.  Your knee becomes more swollen. SEEK IMMEDIATE MEDICAL CARE IF:   Your knee joint feels hot to the touch.  You have chest pain or trouble breathing.   This information is not intended to replace advice given to you by your health care provider. Make sure you discuss any questions you have with your health care provider.   Document Released: 02/17/2007 Document Revised: 05/13/2014 Document Reviewed: 12/06/2013 Elsevier Interactive Patient Education [employer].

## 2015-09-14 NOTE — ED Notes (Signed)
Pt ambulatory to triage with use of walker. Pt states left knee pain for 3 days, no recent injury.

## 2015-10-17 DIAGNOSIS — M1612 Unilateral primary osteoarthritis, left hip: Secondary | ICD-10-CM | POA: Diagnosis present

## 2015-10-17 NOTE — H&P (Deleted)
PREOPERATIVE H&P Patient ID: Phillip Turner MRN: [id number] DOB/AGE: [birth year] 55 y.o.  Chief Complaint: OA LEFT HIP  Planned Procedure Date: 11/14/15 Medical and Cardiac Clearance pending by Dr. Jeanie Cooks.  F/U appt. 11/01/15   HPI: STEPFON Turner is a 55 y.o. male Jehov[employer]'s Witness with a history of substance abuse in remission, DM, HTN, and remote hx of CVA who presents for evaluation of OA LEFT HIP. The patient has a history of pain and functional disability in the left hip due to arthritis and has failed non-surgical conservative treatments for greater than 12 weeks to include NSAID's and/or analgesics, corticosteriod injections and activity modification.  Onset of symptoms was gradual, starting 1 years ago with rapidlly worsening course since that time. The patient noted no past surgery on the left hip.  Patient currently rates pain at 10 out of 10 with activity. Patient has night pain, worsening of pain with activity and weight bearing, pain that interferes with activities of daily living and pain with passive range of motion.  Patient has evidence of subchondral cysts, subchondral sclerosis, periarticular osteophytes and joint space narrowing by imaging studies.  There is no active infection.  Past Medical History  Diagnosis Date  . Diabetes mellitus   . Hypertension   . Hypothyroidism   . Anxiety   . Depression   . Gout   . Arthritis   . Hyperlipidemia   . Rheumatic fever     as a teenager  . Stroke ([place])     Approx 2005  . 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 Substance abuse     former cocaine user- not since March 2014   Past Surgical History  Procedure Laterality Date  . Tonsillectomy    . Boil removed from tailbone      Allergies  Allergen Reactions  . Clonidine Derivatives Other (See Comments)    Slow heartbeat.  . Shellfish Allergy Anaphylaxis and Nausea And Vomiting  . Metformin And Related Nausea And Vomiting    Patient states it makes him sick   . Naproxen Swelling  . Other Itching and Rash    Not pure metals   Prior to Admission medications   Medication Sig Start Date End Date Taking? Authorizing Provider  amLODipine (NORVASC) 10 MG tablet TAKE 1 TABLET BY MOUTH EVERY DAY FOR BLOOD PRESSURE 06/06/14   Cassandria Anger, MD  aspirin 325 MG tablet Take 1 tablet (325 mg total) by mouth daily. For heart heart/stroke prevention Patient taking differently: Take 325 mg by mouth 2 (two) times daily. For heart heart/stroke prevention 04/09/12   Encarnacion Slates, NP  colchicine 0.6 MG tablet TAKE 1 TABLET BY MOUTH TWICE DAILY FOR GOUT FLARE-UPS 09/12/14   Cassandria Anger, MD  diclofenac sodium (VOLTAREN) 1 % GEL Apply 1 application topically 2 (two) times daily. 11/21/14   Historical Provider, MD  empagliflozin (JARDIANCE) 10 MG TABS tablet Take 10 mg by mouth daily.    Historical Provider, MD  furosemide (LASIX) 40 MG tablet Take 1 tablet (40 mg total) by mouth daily as needed for edema. Patient taking differently: Take 40 mg by mouth daily as needed for fluid or edema.  09/08/13   Evie Lacks Plotnikov, MD  glipiZIDE (GLUCOTROL) 5 MG tablet Take 5 mg by mouth 2 (two) times daily before a meal.     Historical Provider, MD  hydrochlorothiazide (HYDRODIURIL) 25 MG tablet Take 25 mg by mouth daily.    Historical Provider, MD  HYDROcodone-acetaminophen (NORCO/VICODIN) 5-325 MG tablet Take 1-2 tablets by mouth every 6 (six) hours as needed. 09/14/15   Montine Circle, PA-C  levothyroxine (SYNTHROID, LEVOTHROID) 175 MCG tablet TAKE 1 TABLET BY MOUTH DAILY BEFORE BREAKFAST Patient not taking: Reported on 11/10/2014    Evie Lacks Plotnikov, MD  lisinopril (PRINIVIL,ZESTRIL) 20 MG tablet TAKE 1 TABLET BY MOUTH TWICE DAILY FOR HIGH BLOOD PRESSURE CONTROL 11/04/14   Evie Lacks Plotnikov, MD  LYRICA 75 MG capsule Take 75 mg by mouth 2 (two) times daily. 12/27/14   Historical Provider, MD  potassium chloride SA (K-DUR,KLOR-CON) 20 MEQ tablet Take 2 tablets (40 mEq  total) by mouth 2 (two) times daily. Patient taking differently: Take 40 mEq by mouth daily. Takes 1 tablet daily, can take another tablet if needed for fluid/edema 07/13/14   Cassandria Anger, MD  simvastatin (ZOCOR) 10 MG tablet Take 1 tablet by mouth daily. 11/04/14   Historical Provider, MD  traZODone (DESYREL) 100 MG tablet TAKE 2 TABLETS BY MOUTH EVERY NIGHT AT BEDTIME FOR SLEEP/DEPRESSION 07/07/14   Cassandria Anger, MD   Social History   Social History  . Marital Status: Married    Spouse Name: N/A  . Number of Children: N/A  . Years of Education: N/A   Social History Main Topics  . Smoking status: No    Types: Cigarettes  . Smokeless tobacco: Never Used  . Alcohol Use: No       . Drug Use: No     Comment: Cocaine, crack former- since 2014  . Sexual Activity: Yes    Social History Narrative   Married   11th grade education   Not employed    Family History  Problem Relation Age of Onset  . Coronary artery disease Mother   . Diabetes type II Father   . Hypertension Father   . Colon cancer Neg Hx   . Esophageal cancer Neg Hx   . Rectal cancer Neg Hx   . Stomach cancer Neg Hx     ROS: Currently denies lightheadedness, dizziness, Fever, chills, CP, SOB.   No personal history of DVT, PE, MI.  CVA 2005 on ASA 325 daily. No loose teeth or dentures. All other systems have been reviewed and were otherwise negative with the exception of those mentioned in the HPI and as above.  Objective: Vitals: Ht: 5'10" Wt: 303 Temp: 98.2 BP: 132/73 Pulse: 62 O2 97% on room air. Physical Exam: General: Alert, NAD.  Trendelenberg Gait - Walks with a cane. HEENT: EOMI, Good Neck Extension  Pulm: No increased work of breathing.  Clear B/L A/P w/o crackle or wheeze.  CV: RRR, No m/g/r appreciated  GI: soft, NT, ND Neuro: Neuro grossly intact b/l upper/lower ext.  Sensation intact distally Skin: No lesions in the area of chief complaint MSK/Surgical Site: Non tender over greater  trochanter.  Pain with passive ROM.  Positive Stinchfield.  5/5 strength.  NVI.  Sensation intact distally.   Imaging Review Two view pelvis plain radiographs with balls reveals significant osteoarthritis bilaterally.  Much more severe on the left side.  Severe joint space narrowing and shortening.    Assessment: OA LEFT HIP  Plan: Plan for Procedure(s): LEFT TOTAL HIP ARTHROPLASTY ANTERIOR APPROACH  The patient history, physical exam, clinical judgement of the provider and imaging are consistent with end stage degenerative joint disease and total joint arthroplasty is deemed medically necessary. The treatment options including medical management, injection therapy, and arthroplasty were discussed at length. The risks  and benefits of Procedure(s): LEFT TOTAL HIP ARTHROPLASTY ANTERIOR APPROACH were presented and reviewed.  The risks of nonoperative treatment, versus surgical intervention including but not limited to continued pain, aseptic loosening, stiffness, dislocation/subluxation, infection, bleeding, nerve injury, blood clots, cardiopulmonary complications, morbidity, mortality, among others were discussed. The patient verbalizes understanding and wishes to proceed with the plan.  Patient is being admitted for inpatient treatment for surgery, pain control, PT, OT, prophylactic antibiotics, VTE prophylaxis, progressive ambulation, ADL's and discharge planning.   Dental prophylaxis discussed and recommended for 2 years postoperatively.  Jehovah's Witness - No Blood or Blood Products. The patient does not meet the criteria for IV TXA dt hx of CVA. Xarelto will be used postoperatively for DVT prophylaxis dt hx of CVA in addition to SCDs, and early ambulation. The patient is planning to be discharged home with home health services in care of his 2 daughters.  Charna Elizabeth Martensen III, PA-C 10/17/2015 1:40 PM

## 2015-10-18 NOTE — H&P (Signed)
PREOPERATIVE H&P Patient ID: Phillip Turner MRN: [id number] DOB/AGE: 55-28-1962 55 y.o.  Chief Complaint: OA LEFT HIP  Planned Procedure Date: 11/14/15 Medical and Cardiac Clearance by Dr. Jeanie Cooks.    HPI: Phillip Turner is a 55 y.o. male Jehovah's Witness with a history of substance abuse in remission, DM, HTN, and remote hx of CVA who presents for evaluation of OA LEFT HIP. The patient has a history of pain and functional disability in the left hip due to arthritis and has failed non-surgical conservative treatments for greater than 12 weeks to include NSAID's and/or analgesics, corticosteriod injections and activity modification. Onset of symptoms was gradual, starting 1 years ago with rapidlly worsening course since that time. The patient noted no past surgery on the left hip. Patient currently rates pain at 10 out of 10 with activity. Patient has night pain, worsening of pain with activity and weight bearing, pain that interferes with activities of daily living and pain with passive range of motion. Patient has evidence of subchondral cysts, subchondral sclerosis, periarticular osteophytes and joint space narrowing by imaging studies. There is no active infection.  Past Medical History  Diagnosis Date  . Diabetes mellitus   . Hypertension   . Hypothyroidism   . Anxiety   . Depression   . Gout   . Arthritis   . Hyperlipidemia   . Rheumatic fever     as a teenager  . Stroke ([place])     Approx 2005  . 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 Substance abuse     former cocaine user- not since March 2014   Past Surgical History  Procedure Laterality Date  . Tonsillectomy    . Boil removed from tailbone      Allergies  Allergen Reactions  . Clonidine Derivatives Other (See Comments)    Slow heartbeat.  . Shellfish Allergy Anaphylaxis and Nausea And Vomiting  . Metformin And Related  Nausea And Vomiting    Patient states it makes him sick  . Naproxen Swelling  . Other Itching and Rash    Not pure metals   Prior to Admission medications   Medication Sig Start Date End Date Taking? Authorizing Provider  amLODipine (NORVASC) 10 MG tablet TAKE 1 TABLET BY MOUTH EVERY DAY FOR BLOOD PRESSURE 06/06/14   Cassandria Anger, MD  aspirin 325 MG tablet Take 1 tablet (325 mg total) by mouth daily. For heart heart/stroke prevention Patient taking differently: Take 325 mg by mouth 2 (two) times daily. For heart heart/stroke prevention 04/09/12   Encarnacion Slates, NP  colchicine 0.6 MG tablet TAKE 1 TABLET BY MOUTH TWICE DAILY FOR GOUT FLARE-UPS 09/12/14   Cassandria Anger, MD  diclofenac sodium (VOLTAREN) 1 % GEL Apply 1 application topically 2 (two) times daily. 11/21/14   Historical Provider, MD  empagliflozin (JARDIANCE) 10 MG TABS tablet Take 10 mg by mouth daily.    Historical Provider, MD  furosemide (LASIX) 40 MG tablet Take 1 tablet (40 mg total) by mouth daily as needed for edema. Patient taking differently: Take 40 mg by mouth daily as needed for fluid or edema.  09/08/13   Evie Lacks Plotnikov, MD  glipiZIDE (GLUCOTROL) 5 MG tablet Take 5 mg by mouth 2 (two) times daily before a meal.     Historical Provider, MD  hydrochlorothiazide (HYDRODIURIL) 25 MG tablet Take 25 mg by mouth daily.    Historical Provider, MD  HYDROcodone-acetaminophen (NORCO/VICODIN) 5-325 MG tablet Take 1-2  tablets by mouth every 6 (six) hours as needed. 09/14/15   Montine Circle, PA-C  levothyroxine (SYNTHROID, LEVOTHROID) 175 MCG tablet TAKE 1 TABLET BY MOUTH DAILY BEFORE BREAKFAST Patient not taking: Reported on 11/10/2014    Evie Lacks Plotnikov, MD  lisinopril (PRINIVIL,ZESTRIL) 20 MG tablet TAKE 1 TABLET BY MOUTH TWICE DAILY FOR HIGH BLOOD PRESSURE CONTROL 11/04/14   Evie Lacks Plotnikov, MD  LYRICA 75 MG capsule Take 75 mg by  mouth 2 (two) times daily. 12/27/14   Historical Provider, MD  potassium chloride SA (K-DUR,KLOR-CON) 20 MEQ tablet Take 2 tablets (40 mEq total) by mouth 2 (two) times daily. Patient taking differently: Take 40 mEq by mouth daily. Takes 1 tablet daily, can take another tablet if needed for fluid/edema 07/13/14   Cassandria Anger, MD  simvastatin (ZOCOR) 10 MG tablet Take 1 tablet by mouth daily. 11/04/14   Historical Provider, MD  traZODone (DESYREL) 100 MG tablet TAKE 2 TABLETS BY MOUTH EVERY NIGHT AT BEDTIME FOR SLEEP/DEPRESSION 07/07/14   Cassandria Anger, MD   Social History   Social History  . Marital Status: Married    Spouse Name: N/A  . Number of Children: N/A  . Years of Education: N/A   Social History Main Topics  . Smoking status: No    Types: Cigarettes  . Smokeless tobacco: Never Used  . Alcohol Use: No       . Drug Use: No     Comment: Cocaine, crack former- since 2014  . Sexual Activity: Yes    Social History Narrative   Married   11th grade education   Not employed    Family History  Problem Relation Age of Onset  . Coronary artery disease Mother   . Diabetes type II Father   . Hypertension Father   . Colon cancer Neg Hx   . Esophageal cancer Neg Hx   . Rectal cancer Neg Hx   . Stomach cancer Neg Hx     ROS: Currently denies lightheadedness, dizziness, Fever, chills, CP, SOB.  No personal history of DVT, PE, MI. CVA 2005 on ASA 325 daily. No loose teeth or dentures. All other systems have been reviewed and were otherwise negative with the exception of those mentioned in the HPI and as above.  Objective: Vitals: Ht: 5'10" Wt: 303 Temp: 98.2 BP: 132/73 Pulse: 62 O2 97% on room air. Physical Exam: General: Alert, NAD. Trendelenberg Gait - Walks with a cane. HEENT: EOMI, Good Neck Extension  Pulm: No increased work of breathing. Clear  B/L A/P w/o crackle or wheeze.  CV: RRR, No m/g/r appreciated  GI: soft, NT, ND Neuro: Neuro grossly intact b/l upper/lower ext. Sensation intact distally Skin: No lesions in the area of chief complaint MSK/Surgical Site: Non tender over greater trochanter. Pain with passive ROM. Positive Stinchfield. 5/5 strength. NVI. Sensation intact distally.   Imaging Review Two view pelvis plain radiographs with balls reveals significant osteoarthritis bilaterally. Much more severe on the left side. Severe joint space narrowing and shortening.   Assessment: OA LEFT HIP  Plan: Plan for Procedure(s): LEFT TOTAL HIP ARTHROPLASTY ANTERIOR APPROACH  The patient history, physical exam, clinical judgement of the provider and imaging are consistent with end stage degenerative joint disease and total joint arthroplasty is deemed medically necessary. The treatment options including medical management, injection therapy, and arthroplasty were discussed at length. The risks and benefits of Procedure(s): LEFT TOTAL HIP ARTHROPLASTY ANTERIOR APPROACH were presented and reviewed.  The risks of nonoperative treatment,  versus surgical intervention including but not limited to continued pain, aseptic loosening, stiffness, dislocation/subluxation, infection, bleeding, nerve injury, blood clots, cardiopulmonary complications, morbidity, mortality, among others were discussed. The patient verbalizes understanding and wishes to proceed with the plan.  Patient is being admitted for inpatient treatment for surgery, pain control, PT, OT, prophylactic antibiotics, VTE prophylaxis, progressive ambulation, ADL's and discharge planning.   Dental prophylaxis discussed and recommended for 2 years postoperatively.  Jehovah's Witness - No Blood or Blood Products. The patient does not meet the criteria for IV TXA dt hx of CVA. Aspirin will be used postoperatively for DVT prophylaxis per primary physician recomendations in  addition to SCDs, and early ambulation. The patient is planning to be discharged home with home health services in care of his 2 daughters.  Charna Elizabeth Martensen III, PA-C 10/17/2015 1:40 PM

## 2015-11-02 ENCOUNTER — Other Ambulatory Visit (HOSPITAL_COMMUNITY): Payer: Self-pay | Admitting: Internal Medicine

## 2015-11-02 ENCOUNTER — Inpatient Hospital Stay (HOSPITAL_COMMUNITY)
Admission: RE | Admit: 2015-11-02 | Discharge: 2015-11-02 | Disposition: A | Discharge status: Home / Self Care | Payer: Self-pay | Source: Ambulatory Visit

## 2015-11-02 DIAGNOSIS — R609 Edema, unspecified: Secondary | ICD-10-CM

## 2015-11-03 ENCOUNTER — Ambulatory Visit (HOSPITAL_COMMUNITY)
Admission: RE | Admit: 2015-11-03 | Discharge: 2015-11-03 | Disposition: A | Discharge status: Home / Self Care | Payer: Medicare HMO | Source: Ambulatory Visit | Attending: Vascular Surgery | Admitting: Vascular Surgery

## 2015-11-03 DIAGNOSIS — R609 Edema, unspecified: Secondary | ICD-10-CM | POA: Diagnosis present

## 2015-11-03 DIAGNOSIS — F329 Major depressive disorder, single episode, unspecified: Secondary | ICD-10-CM | POA: Insufficient documentation

## 2015-11-03 DIAGNOSIS — F419 Anxiety disorder, unspecified: Secondary | ICD-10-CM | POA: Diagnosis not present

## 2015-11-03 DIAGNOSIS — D573 Sickle-cell trait: Secondary | ICD-10-CM | POA: Diagnosis not present

## 2015-11-03 DIAGNOSIS — I10 Essential (primary) hypertension: Secondary | ICD-10-CM | POA: Diagnosis not present

## 2015-11-03 DIAGNOSIS — E785 Hyperlipidemia, unspecified: Secondary | ICD-10-CM | POA: Insufficient documentation

## 2015-11-03 DIAGNOSIS — E119 Type 2 diabetes mellitus without complications: Secondary | ICD-10-CM | POA: Insufficient documentation

## 2015-11-06 ENCOUNTER — Encounter (HOSPITAL_COMMUNITY): Payer: Self-pay

## 2015-11-06 ENCOUNTER — Encounter (HOSPITAL_COMMUNITY)
Admission: RE | Admit: 2015-11-06 | Discharge: 2015-11-06 | Disposition: A | Discharge status: Home / Self Care | Payer: Medicare HMO | Source: Ambulatory Visit | Attending: Orthopedic Surgery | Admitting: Orthopedic Surgery

## 2015-11-06 DIAGNOSIS — M1612 Unilateral primary osteoarthritis, left hip: Secondary | ICD-10-CM | POA: Diagnosis not present

## 2015-11-06 DIAGNOSIS — Z01812 Encounter for preprocedural laboratory examination: Secondary | ICD-10-CM | POA: Diagnosis not present

## 2015-11-06 LAB — URINALYSIS, ROUTINE W REFLEX MICROSCOPIC
Bilirubin Urine: NEGATIVE
Glucose, UA: >1000 mg/dL — AB
HGB URINE DIPSTICK: NEGATIVE
KETONES UR: 15 mg/dL — AB
Leukocytes, UA: NEGATIVE
Nitrite: NEGATIVE
PH: 6 (ref 5.0–8.0)
PROTEIN: NEGATIVE mg/dL
SPECIFIC GRAVITY, URINE: 1.017 (ref 1.005–1.030)

## 2015-11-06 LAB — COMPREHENSIVE METABOLIC PANEL
ALBUMIN: 3.8 g/dL (ref 3.5–5.0)
ALK PHOS: 90 U/L (ref 38–126)
ALT: 32 U/L (ref 17–63)
AST: 28 U/L (ref 15–41)
Anion gap: 8 (ref 5–15)
BILIRUBIN TOTAL: 1.3 mg/dL — AB (ref 0.3–1.2)
BUN: 6 mg/dL (ref 6–20)
CO2: 26 mmol/L (ref 22–32)
CREATININE: 1.16 mg/dL (ref 0.61–1.24)
Calcium: 9.7 mg/dL (ref 8.9–10.3)
Chloride: 107 mmol/L (ref 101–111)
GFR calc Af Amer: >60 mL/min (ref >60)
GFR calc non Af Amer: >60 mL/min (ref >60)
Glucose, Bld: 87 mg/dL (ref 65–99)
POTASSIUM: 3.3 mmol/L — AB (ref 3.5–5.1)
Sodium: 141 mmol/L (ref 135–145)
TOTAL PROTEIN: 6.6 g/dL (ref 6.5–8.1)

## 2015-11-06 LAB — CBC WITH DIFFERENTIAL/PLATELET
BASOS ABS: 0 10*3/uL (ref 0.0–0.1)
BASOS PCT: 0 %
Eosinophils Absolute: 0.3 10*3/uL (ref 0.0–0.7)
Eosinophils Relative: 4 %
HEMATOCRIT: 43.3 % (ref 39.0–52.0)
HEMOGLOBIN: 14.7 g/dL (ref 13.0–17.0)
LYMPHS PCT: 32 %
Lymphs Abs: 2.5 10*3/uL (ref 0.7–4.0)
MCH: 27.1 pg (ref 26.0–34.0)
MCHC: 33.9 g/dL (ref 30.0–36.0)
MCV: 79.7 fL (ref 78.0–100.0)
Monocytes Absolute: 0.9 10*3/uL (ref 0.1–1.0)
Monocytes Relative: 11 %
NEUTROS ABS: 4.2 10*3/uL (ref 1.7–7.7)
NEUTROS PCT: 53 %
Platelets: 311 10*3/uL (ref 150–400)
RBC: 5.43 MIL/uL (ref 4.22–5.81)
RDW: 15.7 % — AB (ref 11.5–15.5)
WBC: 7.9 10*3/uL (ref 4.0–10.5)

## 2015-11-06 LAB — URINE MICROSCOPIC-ADD ON
RBC / HPF: NONE SEEN RBC/hpf (ref 0–5)
WBC UA: NONE SEEN WBC/hpf (ref 0–5)

## 2015-11-06 LAB — PROTIME-INR
INR: 1.05 (ref 0.00–1.49)
Prothrombin Time: 13.9 seconds (ref 11.6–15.2)

## 2015-11-06 LAB — SURGICAL PCR SCREEN
MRSA, PCR: NEGATIVE
Staphylococcus aureus: POSITIVE — AB

## 2015-11-06 LAB — APTT: APTT: 27 s (ref 24–37)

## 2015-11-06 LAB — GLUCOSE, CAPILLARY: GLUCOSE-CAPILLARY: 77 mg/dL (ref 65–99)

## 2015-11-06 NOTE — Progress Notes (Signed)
Called Dr. Debroah Loop office to notify them of refusal of blood, left message for [facility]

## 2015-11-06 NOTE — Progress Notes (Signed)
I called a prescription for Mupirocin ointment to [facility], [address] [place], [place]

## 2015-11-06 NOTE — Pre-Procedure Instructions (Addendum)
Phillip Turner  11/06/2015      [id number] - [place], [place] - [address] [place] [address] Phone: [phone] Fax: [phone]  [id number] - [place], [place] [address] [place] [address] Phone: [phone] Fax: [phone]    Your procedure is scheduled on Tuesday, July 11th, 2017.  Report to [facility] Admitting at 10:00 A.M.   Call this number if you have problems the morning of surgery:  [phone]   Remember:  Do not eat food or drink liquids after midnight.   Take these medicines the morning of surgery with A SIP OF WATER: Amlodipine (Norvasc), Hydrocodone-acetaminophen (Norco) if needed, Levothyroxine (Synthroid), metoprolol,   WHAT DO I DO ABOUT MY DIABETES MEDICATION?  Marland Kitchen Do not take oral diabetes medicines (pills) the morning of surgery.  Do NOT take Glipizide or Jardiance the morning of surgery.    . THE NIGHT BEFORE SURGERY, Do NOT take the evening dose of Glipizide.    7 days prior to surgery, stop taking: Aspirin, NSAIDS, Aleve, Naproxen, Ibuprofen, Advil, Motrin, BC's, Goody's, Fish oil, all herbal medications, and all vitamins.     Do not wear jewelry.  Do not wear lotions, powders, or colognes.    Men may shave face and neck.  Do not bring valuables to the hospital.  [facility] is not responsible for any belongings or valuables.  Contacts, dentures or bridgework may not be worn into surgery.  Leave your suitcase in the car.  After surgery it may be brought to your room.  For patients admitted to the hospital, discharge time will be determined by your treatment team.  Patients discharged the day of surgery will not be allowed to drive home.   Special instructions:  Preparing for Surgery.   Please read over the following fact sheets that you were given. MRSA Information    How to Manage Your Diabetes Before  and After Surgery  Why is it important to control my blood sugar before and after surgery? . Improving blood sugar levels before and after surgery helps healing and can limit problems. . A way of improving blood sugar control is eating a healthy diet by: o  Eating less sugar and carbohydrates o  Increasing activity/exercise o  Talking with your doctor about reaching your blood sugar goals . High blood sugars (greater than 180 mg/dL) can raise your risk of infections and slow your recovery, so you will need to focus on controlling your diabetes during the weeks before surgery. . Make sure that the doctor who takes care of your diabetes knows about your planned surgery including the date and location.  How do I manage my blood sugar before surgery? . Check your blood sugar at least 4 times a day, starting 2 days before surgery, to make sure that the level is not too high or low. o Check your blood sugar the morning of your surgery when you wake up and every 2 hours until you get to the Short Stay unit. . If your blood sugar is less than 70 mg/dL, you will need to treat for low blood sugar: o Do not take insulin. o Treat a low blood sugar (less than 70 mg/dL) with  cup of clear juice (cranberry or apple), 4 glucose tablets, OR glucose gel. o Recheck blood sugar in 15 minutes after treatment (to make sure it is greater than  70 mg/dL). If your blood sugar is not greater than 70 mg/dL on recheck, call [phone] for further instructions. . Report your blood sugar to the short stay nurse when you get to Short Stay.  . If you are admitted to the hospital after surgery: o Your blood sugar will be checked by the staff and you will probably be given insulin after surgery (instead of oral diabetes medicines) to make sure you have good blood sugar levels. o The goal for blood sugar control after surgery is 80-180 mg/dL.      [place]- Preparing For Surgery  Before surgery, you can play an  important role. Because skin is not sterile, your skin needs to be as free of germs as possible. You can reduce the number of germs on your skin by washing with CHG (chlorahexidine gluconate) Soap before surgery.  CHG is an antiseptic cleaner which kills germs and bonds with the skin to continue killing germs even after washing.  Please do not use if you have an allergy to CHG or antibacterial soaps. If your skin becomes reddened/irritated stop using the CHG.  Do not shave (including legs and underarms) for at least 48 hours prior to first CHG shower. It is OK to shave your face.  Please follow these instructions carefully.   1. Shower the NIGHT BEFORE SURGERY and the MORNING OF SURGERY with CHG.   2. If you chose to wash your hair, wash your hair first as usual with your normal shampoo.  3. After you shampoo, rinse your hair and body thoroughly to remove the shampoo.  4. Use CHG as you would any other liquid soap. You can apply CHG directly to the skin and wash gently with a scrungie or a clean washcloth.   5. Apply the CHG Soap to your body ONLY FROM THE NECK DOWN.  Do not use on open wounds or open sores. Avoid contact with your eyes, ears, mouth and genitals (private parts). Wash genitals (private parts) with your normal soap.  6. Wash thoroughly, paying special attention to the area where your surgery will be performed.  7. Thoroughly rinse your body with warm water from the neck down.  8. DO NOT shower/wash with your normal soap after using and rinsing off the CHG Soap.  9. Pat yourself dry with a CLEAN TOWEL.   10. Wear CLEAN PAJAMAS   11. Place CLEAN SHEETS on your bed the night of your first shower and DO NOT SLEEP WITH PETS.  Day of Surgery: Do not apply any deodorants/lotions. Please wear clean clothes to the hospital/surgery center.

## 2015-11-06 NOTE — Progress Notes (Signed)
PCP - Thedora Hinders Cardiologist - deneis  Chest x-ray - not needed EKG - 01/21/15 Stress Test - couldn't remember date or where ECHO - denies Cardiac Cath - denies    Patient denies shortness of breath and chest pain at PAT appointment  Sleep Apnea score of 5 sent STOP BANG tool to PCP  Sending chart to anesthesia for review

## 2015-11-07 LAB — HEMOGLOBIN A1C
Hgb A1c MFr Bld: 6.4 % — ABNORMAL HIGH (ref 4.8–5.6)
Mean Plasma Glucose: 137 mg/dL

## 2015-11-13 MED ORDER — ACETAMINOPHEN 500 MG PO TABS
1000.0000 mg | ORAL_TABLET | Freq: Once | ORAL | Status: AC
  Administered 2015-11-14: 1000 mg via ORAL
  Filled 2015-11-13: qty 2

## 2015-11-13 MED ORDER — TRANEXAMIC ACID 1000 MG/10ML IV SOLN
1000.0000 mg | INTRAVENOUS | Status: AC
  Administered 2015-11-14: 1000 mg via INTRAVENOUS
  Filled 2015-11-13 (×2): qty 10

## 2015-11-13 MED ORDER — DEXTROSE-NACL 5-0.45 % IV SOLN: 100.0000 mL/h | INTRAVENOUS | Status: DC

## 2015-11-13 MED ORDER — DEXTROSE 5 % IV SOLN
3.0000 g | INTRAVENOUS | Status: AC
  Administered 2015-11-14: 3 g via INTRAVENOUS
  Filled 2015-11-13 (×2): qty 3000

## 2015-11-13 MED ORDER — CHLORHEXIDINE GLUCONATE 4 % EX LIQD: 60.0000 mL | Freq: Once | CUTANEOUS | Status: DC

## 2015-11-14 ENCOUNTER — Inpatient Hospital Stay (HOSPITAL_COMMUNITY)
Admission: RE | Admit: 2015-11-14 | Discharge: 2015-11-16 | DRG: 470 | Disposition: A | Discharge status: Home Health | Payer: Medicare HMO | Source: Ambulatory Visit | Attending: Orthopedic Surgery | Admitting: Orthopedic Surgery

## 2015-11-14 ENCOUNTER — Encounter (HOSPITAL_COMMUNITY)
Admission: RE | Disposition: A | Discharge status: Home Health | Payer: Self-pay | Source: Ambulatory Visit | Attending: Orthopedic Surgery

## 2015-11-14 ENCOUNTER — Inpatient Hospital Stay (HOSPITAL_COMMUNITY): Payer: Medicare HMO | Admitting: Certified Registered Nurse Anesthetist

## 2015-11-14 ENCOUNTER — Inpatient Hospital Stay (HOSPITAL_COMMUNITY): Payer: Medicare HMO | Admitting: Vascular Surgery

## 2015-11-14 ENCOUNTER — Inpatient Hospital Stay (HOSPITAL_COMMUNITY): Payer: Medicare HMO

## 2015-11-14 DIAGNOSIS — Z79899 Other long term (current) drug therapy: Secondary | ICD-10-CM

## 2015-11-14 DIAGNOSIS — F1410 Cocaine abuse, uncomplicated: Secondary | ICD-10-CM | POA: Diagnosis present

## 2015-11-14 DIAGNOSIS — M109 Gout, unspecified: Secondary | ICD-10-CM | POA: Diagnosis present

## 2015-11-14 DIAGNOSIS — I639 Cerebral infarction, unspecified: Secondary | ICD-10-CM | POA: Diagnosis present

## 2015-11-14 DIAGNOSIS — Z8673 Personal history of transient ischemic attack (TIA), and cerebral infarction without residual deficits: Secondary | ICD-10-CM | POA: Diagnosis not present

## 2015-11-14 DIAGNOSIS — Z7984 Long term (current) use of oral hypoglycemic drugs: Secondary | ICD-10-CM | POA: Diagnosis not present

## 2015-11-14 DIAGNOSIS — Z419 Encounter for procedure for purposes other than remedying health state, unspecified: Secondary | ICD-10-CM

## 2015-11-14 DIAGNOSIS — M1612 Unilateral primary osteoarthritis, left hip: Secondary | ICD-10-CM | POA: Diagnosis present

## 2015-11-14 DIAGNOSIS — F1020 Alcohol dependence, uncomplicated: Secondary | ICD-10-CM | POA: Diagnosis present

## 2015-11-14 DIAGNOSIS — IMO0002 Reserved for concepts with insufficient information to code with codable children: Secondary | ICD-10-CM | POA: Diagnosis present

## 2015-11-14 DIAGNOSIS — E119 Type 2 diabetes mellitus without complications: Secondary | ICD-10-CM | POA: Diagnosis present

## 2015-11-14 DIAGNOSIS — Z7982 Long term (current) use of aspirin: Secondary | ICD-10-CM | POA: Diagnosis not present

## 2015-11-14 DIAGNOSIS — R413 Other amnesia: Secondary | ICD-10-CM | POA: Diagnosis present

## 2015-11-14 DIAGNOSIS — I10 Essential (primary) hypertension: Secondary | ICD-10-CM | POA: Diagnosis present

## 2015-11-14 DIAGNOSIS — Z888 Allergy status to other drugs, medicaments and biological substances status: Secondary | ICD-10-CM | POA: Diagnosis not present

## 2015-11-14 DIAGNOSIS — E785 Hyperlipidemia, unspecified: Secondary | ICD-10-CM | POA: Diagnosis present

## 2015-11-14 DIAGNOSIS — Z91013 Allergy to seafood: Secondary | ICD-10-CM

## 2015-11-14 DIAGNOSIS — Z886 Allergy status to analgesic agent status: Secondary | ICD-10-CM | POA: Diagnosis not present

## 2015-11-14 DIAGNOSIS — F32A Depression, unspecified: Secondary | ICD-10-CM | POA: Diagnosis present

## 2015-11-14 DIAGNOSIS — F329 Major depressive disorder, single episode, unspecified: Secondary | ICD-10-CM | POA: Diagnosis present

## 2015-11-14 DIAGNOSIS — D571 Sickle-cell disease without crisis: Secondary | ICD-10-CM | POA: Diagnosis present

## 2015-11-14 DIAGNOSIS — E1165 Type 2 diabetes mellitus with hyperglycemia: Secondary | ICD-10-CM | POA: Diagnosis present

## 2015-11-14 DIAGNOSIS — E039 Hypothyroidism, unspecified: Secondary | ICD-10-CM | POA: Diagnosis present

## 2015-11-14 DIAGNOSIS — E118 Type 2 diabetes mellitus with unspecified complications: Secondary | ICD-10-CM

## 2015-11-14 DIAGNOSIS — F1999 Other psychoactive substance use, unspecified with unspecified psychoactive substance-induced disorder: Secondary | ICD-10-CM | POA: Diagnosis present

## 2015-11-14 HISTORY — PX: TOTAL HIP ARTHROPLASTY: SHX124

## 2015-11-14 LAB — GLUCOSE, CAPILLARY
GLUCOSE-CAPILLARY: 129 mg/dL — AB (ref 65–99)
GLUCOSE-CAPILLARY: 109 mg/dL — AB (ref 65–99)
Glucose-Capillary: 154 mg/dL — ABNORMAL HIGH (ref 65–99)

## 2015-11-14 SURGERY — ARTHROPLASTY, HIP, TOTAL, ANTERIOR APPROACH
Anesthesia: Monitor Anesthesia Care | Site: Hip | Laterality: Left

## 2015-11-14 MED ORDER — OXYCODONE HCL 5 MG PO TABS: 5.0000 mg | ORAL_TABLET | Freq: Once | ORAL | Status: DC | PRN

## 2015-11-14 MED ORDER — ONDANSETRON HCL 4 MG/2ML IJ SOLN: 4.0000 mg | Freq: Four times a day (QID) | INTRAMUSCULAR | Status: DC | PRN

## 2015-11-14 MED ORDER — OXYCODONE HCL 5 MG PO TABS
5.0000 mg | ORAL_TABLET | ORAL | Status: DC | PRN
  Administered 2015-11-14 – 2015-11-16 (×15): 10 mg via ORAL
  Filled 2015-11-14 (×15): qty 2

## 2015-11-14 MED ORDER — METHOCARBAMOL 500 MG PO TABS
500.0000 mg | ORAL_TABLET | Freq: Four times a day (QID) | ORAL | Status: DC | PRN
  Administered 2015-11-14 – 2015-11-16 (×8): 500 mg via ORAL
  Filled 2015-11-14 (×7): qty 1

## 2015-11-14 MED ORDER — METHOCARBAMOL 500 MG PO TABS: 500.0000 mg | ORAL_TABLET | Freq: Four times a day (QID) | ORAL | Status: DC | PRN

## 2015-11-14 MED ORDER — LACTATED RINGERS IV SOLN: INTRAVENOUS | Status: DC

## 2015-11-14 MED ORDER — FERROUS SULFATE 325 (65 FE) MG PO TABS
325.0000 mg | ORAL_TABLET | Freq: Three times a day (TID) | ORAL | Status: DC
  Administered 2015-11-14 – 2015-11-15 (×4): 325 mg via ORAL
  Filled 2015-11-14 (×5): qty 1

## 2015-11-14 MED ORDER — PROPOFOL 10 MG/ML IV BOLUS
INTRAVENOUS | Status: AC
  Filled 2015-11-14: qty 40

## 2015-11-14 MED ORDER — EPHEDRINE SULFATE 50 MG/ML IJ SOLN
INTRAMUSCULAR | Status: DC | PRN
  Administered 2015-11-14 (×3): 10 mg via INTRAVENOUS

## 2015-11-14 MED ORDER — HYDROMORPHONE HCL 1 MG/ML IJ SOLN
0.2500 mg | INTRAMUSCULAR | Status: DC | PRN
  Administered 2015-11-14 (×4): 0.5 mg via INTRAVENOUS

## 2015-11-14 MED ORDER — OXYCODONE-ACETAMINOPHEN 5-325 MG PO TABS: 1.0000 | ORAL_TABLET | ORAL | Status: DC | PRN

## 2015-11-14 MED ORDER — ONDANSETRON HCL 4 MG/2ML IJ SOLN
INTRAMUSCULAR | Status: DC | PRN
  Administered 2015-11-14: 4 mg via INTRAVENOUS

## 2015-11-14 MED ORDER — MORPHINE SULFATE (PF) 2 MG/ML IV SOLN
INTRAVENOUS | Status: AC
  Filled 2015-11-14: qty 1

## 2015-11-14 MED ORDER — 0.9 % SODIUM CHLORIDE (POUR BTL) OPTIME
TOPICAL | Status: DC | PRN
  Administered 2015-11-14: 1000 mL

## 2015-11-14 MED ORDER — TRANEXAMIC ACID 1000 MG/10ML IV SOLN
1000.0000 mg | Freq: Once | INTRAVENOUS | Status: AC
  Administered 2015-11-14: 1000 mg via INTRAVENOUS
  Filled 2015-11-14: qty 10

## 2015-11-14 MED ORDER — HYDROMORPHONE HCL 1 MG/ML IJ SOLN
INTRAMUSCULAR | Status: AC
  Administered 2015-11-14: 0.5 mg via INTRAVENOUS
  Filled 2015-11-14: qty 1

## 2015-11-14 MED ORDER — MORPHINE SULFATE (PF) 2 MG/ML IV SOLN
2.0000 mg | Freq: Once | INTRAVENOUS | Status: AC
  Administered 2015-11-14: 2 mg via INTRAVENOUS

## 2015-11-14 MED ORDER — IRBESARTAN 300 MG PO TABS
300.0000 mg | ORAL_TABLET | Freq: Every day | ORAL | Status: DC
  Filled 2015-11-14: qty 1

## 2015-11-14 MED ORDER — TRANEXAMIC ACID 1000 MG/10ML IV SOLN
2000.0000 mg | INTRAVENOUS | Status: AC
  Administered 2015-11-14: 2000 mg via TOPICAL
  Filled 2015-11-14: qty 20

## 2015-11-14 MED ORDER — DIPHENHYDRAMINE HCL 12.5 MG/5ML PO ELIX: 12.5000 mg | ORAL_SOLUTION | ORAL | Status: DC | PRN

## 2015-11-14 MED ORDER — SIMVASTATIN 10 MG PO TABS
10.0000 mg | ORAL_TABLET | Freq: Every day | ORAL | Status: DC
  Administered 2015-11-14 – 2015-11-15 (×2): 10 mg via ORAL
  Filled 2015-11-14 (×2): qty 1

## 2015-11-14 MED ORDER — DOCUSATE SODIUM 100 MG PO CAPS
100.0000 mg | ORAL_CAPSULE | Freq: Two times a day (BID) | ORAL | Status: DC
  Administered 2015-11-14 – 2015-11-16 (×4): 100 mg via ORAL
  Filled 2015-11-14 (×4): qty 1

## 2015-11-14 MED ORDER — OXYCODONE HCL 5 MG PO TABS
ORAL_TABLET | ORAL | Status: AC
  Administered 2015-11-14: 10 mg via ORAL
  Filled 2015-11-14: qty 2

## 2015-11-14 MED ORDER — METOPROLOL SUCCINATE ER 25 MG PO TB24
25.0000 mg | ORAL_TABLET | Freq: Every day | ORAL | Status: DC
  Administered 2015-11-15 – 2015-11-16 (×2): 25 mg via ORAL
  Filled 2015-11-14 (×2): qty 1

## 2015-11-14 MED ORDER — METOCLOPRAMIDE HCL 5 MG/ML IJ SOLN: 5.0000 mg | Freq: Three times a day (TID) | INTRAMUSCULAR | Status: DC | PRN

## 2015-11-14 MED ORDER — FENTANYL CITRATE (PF) 100 MCG/2ML IJ SOLN
INTRAMUSCULAR | Status: DC | PRN
  Administered 2015-11-14: 50 ug via INTRAVENOUS

## 2015-11-14 MED ORDER — LACTATED RINGERS IV SOLN
INTRAVENOUS | Status: DC | PRN
  Administered 2015-11-14 (×2): via INTRAVENOUS

## 2015-11-14 MED ORDER — MENTHOL 3 MG MT LOZG: 1.0000 | LOZENGE | OROMUCOSAL | Status: DC | PRN

## 2015-11-14 MED ORDER — PHENOL 1.4 % MT LIQD: 1.0000 | OROMUCOSAL | Status: DC | PRN

## 2015-11-14 MED ORDER — POTASSIUM CHLORIDE CRYS ER 20 MEQ PO TBCR
40.0000 meq | EXTENDED_RELEASE_TABLET | Freq: Two times a day (BID) | ORAL | Status: DC
  Administered 2015-11-14 – 2015-11-15 (×2): 20 meq via ORAL
  Administered 2015-11-15 – 2015-11-16 (×2): 40 meq via ORAL
  Filled 2015-11-14 (×4): qty 2

## 2015-11-14 MED ORDER — RIVAROXABAN 10 MG PO TABS: 10.0000 mg | ORAL_TABLET | Freq: Every day | ORAL | Status: DC

## 2015-11-14 MED ORDER — CEFAZOLIN SODIUM-DEXTROSE 2-4 GM/100ML-% IV SOLN
2.0000 g | Freq: Four times a day (QID) | INTRAVENOUS | Status: AC
  Administered 2015-11-14 (×2): 2 g via INTRAVENOUS
  Filled 2015-11-14 (×2): qty 100

## 2015-11-14 MED ORDER — PHENYLEPHRINE HCL 10 MG/ML IJ SOLN
INTRAMUSCULAR | Status: DC | PRN
  Administered 2015-11-14: 80 ug via INTRAVENOUS

## 2015-11-14 MED ORDER — HYDROMORPHONE HCL 1 MG/ML IJ SOLN
0.5000 mg | INTRAMUSCULAR | Status: AC | PRN
  Administered 2015-11-14 (×4): 0.5 mg via INTRAVENOUS

## 2015-11-14 MED ORDER — BUPIVACAINE IN DEXTROSE 0.75-8.25 % IT SOLN
INTRATHECAL | Status: DC | PRN
  Administered 2015-11-14: 2 mL via INTRATHECAL

## 2015-11-14 MED ORDER — PHENYLEPHRINE HCL 10 MG/ML IJ SOLN
10.0000 mg | INTRAVENOUS | Status: DC | PRN
  Administered 2015-11-14: 15 ug/min via INTRAVENOUS

## 2015-11-14 MED ORDER — ONDANSETRON HCL 4 MG PO TABS: 4.0000 mg | ORAL_TABLET | Freq: Three times a day (TID) | ORAL | Status: DC | PRN

## 2015-11-14 MED ORDER — TRAZODONE HCL 100 MG PO TABS
100.0000 mg | ORAL_TABLET | Freq: Every day | ORAL | Status: DC
  Administered 2015-11-14 – 2015-11-15 (×2): 100 mg via ORAL
  Filled 2015-11-14 (×2): qty 1

## 2015-11-14 MED ORDER — RIVAROXABAN 10 MG PO TABS
10.0000 mg | ORAL_TABLET | Freq: Every day | ORAL | Status: DC
  Administered 2015-11-15 – 2015-11-16 (×2): 10 mg via ORAL
  Filled 2015-11-14 (×2): qty 1

## 2015-11-14 MED ORDER — FUROSEMIDE 40 MG PO TABS: 40.0000 mg | ORAL_TABLET | Freq: Every day | ORAL | Status: DC | PRN

## 2015-11-14 MED ORDER — ACETAMINOPHEN 650 MG RE SUPP: 650.0000 mg | Freq: Four times a day (QID) | RECTAL | Status: DC | PRN

## 2015-11-14 MED ORDER — MIDAZOLAM HCL 5 MG/5ML IJ SOLN
INTRAMUSCULAR | Status: DC | PRN
  Administered 2015-11-14 (×2): 1 mg via INTRAVENOUS

## 2015-11-14 MED ORDER — AMLODIPINE BESYLATE 10 MG PO TABS
10.0000 mg | ORAL_TABLET | Freq: Every day | ORAL | Status: DC
  Administered 2015-11-15 – 2015-11-16 (×2): 10 mg via ORAL
  Filled 2015-11-14 (×2): qty 1

## 2015-11-14 MED ORDER — ONDANSETRON HCL 4 MG PO TABS: 4.0000 mg | ORAL_TABLET | Freq: Four times a day (QID) | ORAL | Status: DC | PRN

## 2015-11-14 MED ORDER — PROPOFOL 500 MG/50ML IV EMUL
INTRAVENOUS | Status: DC | PRN
  Administered 2015-11-14: 50 ug/kg/min via INTRAVENOUS

## 2015-11-14 MED ORDER — COLCHICINE 0.6 MG PO TABS: 0.6000 mg | ORAL_TABLET | Freq: Every day | ORAL | Status: DC | PRN

## 2015-11-14 MED ORDER — DOCUSATE SODIUM 100 MG PO CAPS: 100.0000 mg | ORAL_CAPSULE | Freq: Two times a day (BID) | ORAL | Status: DC

## 2015-11-14 MED ORDER — GLIPIZIDE 5 MG PO TABS
5.0000 mg | ORAL_TABLET | Freq: Two times a day (BID) | ORAL | Status: DC
  Administered 2015-11-14 – 2015-11-16 (×4): 5 mg via ORAL
  Filled 2015-11-14 (×4): qty 1

## 2015-11-14 MED ORDER — MIDAZOLAM HCL 2 MG/2ML IJ SOLN
INTRAMUSCULAR | Status: AC
  Filled 2015-11-14: qty 2

## 2015-11-14 MED ORDER — METHOCARBAMOL 500 MG PO TABS
ORAL_TABLET | ORAL | Status: AC
  Administered 2015-11-14: 500 mg via ORAL
  Filled 2015-11-14: qty 1

## 2015-11-14 MED ORDER — PREGABALIN 75 MG PO CAPS
75.0000 mg | ORAL_CAPSULE | Freq: Every day | ORAL | Status: DC
  Administered 2015-11-15 – 2015-11-16 (×2): 75 mg via ORAL
  Filled 2015-11-14 (×2): qty 1

## 2015-11-14 MED ORDER — HYDROCHLOROTHIAZIDE 25 MG PO TABS
25.0000 mg | ORAL_TABLET | Freq: Every day | ORAL | Status: DC
  Filled 2015-11-14: qty 1

## 2015-11-14 MED ORDER — FENTANYL CITRATE (PF) 250 MCG/5ML IJ SOLN
INTRAMUSCULAR | Status: AC
  Filled 2015-11-14: qty 5

## 2015-11-14 MED ORDER — OXYCODONE HCL 5 MG/5ML PO SOLN: 5.0000 mg | Freq: Once | ORAL | Status: DC | PRN

## 2015-11-14 MED ORDER — METHOCARBAMOL 1000 MG/10ML IJ SOLN
500.0000 mg | Freq: Four times a day (QID) | INTRAVENOUS | Status: DC | PRN
  Filled 2015-11-14: qty 5

## 2015-11-14 MED ORDER — AZILSARTAN-CHLORTHALIDONE 40-25 MG PO TABS: 1.0000 | ORAL_TABLET | Freq: Every day | ORAL | Status: DC

## 2015-11-14 MED ORDER — LEVOTHYROXINE SODIUM 112 MCG PO TABS
112.0000 ug | ORAL_TABLET | Freq: Every day | ORAL | Status: DC
  Administered 2015-11-15: 112 ug via ORAL
  Filled 2015-11-14: qty 1

## 2015-11-14 MED ORDER — METOCLOPRAMIDE HCL 5 MG PO TABS: 5.0000 mg | ORAL_TABLET | Freq: Three times a day (TID) | ORAL | Status: DC | PRN

## 2015-11-14 MED ORDER — ACETAMINOPHEN 325 MG PO TABS
650.0000 mg | ORAL_TABLET | Freq: Four times a day (QID) | ORAL | Status: DC | PRN
  Administered 2015-11-15 (×3): 650 mg via ORAL
  Filled 2015-11-14 (×3): qty 2

## 2015-11-14 MED ORDER — MORPHINE SULFATE (PF) 2 MG/ML IV SOLN
2.0000 mg | INTRAVENOUS | Status: DC | PRN
  Administered 2015-11-14 – 2015-11-15 (×5): 2 mg via INTRAVENOUS
  Filled 2015-11-14 (×4): qty 1

## 2015-11-14 MED ORDER — INSULIN ASPART 100 UNIT/ML ~~LOC~~ SOLN
0.0000 [IU] | SUBCUTANEOUS | Status: DC
  Administered 2015-11-15: 4 [IU] via SUBCUTANEOUS
  Administered 2015-11-15 (×3): 2 [IU] via SUBCUTANEOUS
  Administered 2015-11-15: 4 [IU] via SUBCUTANEOUS
  Administered 2015-11-16 (×2): 2 [IU] via SUBCUTANEOUS
  Administered 2015-11-16: 12 [IU] via SUBCUTANEOUS

## 2015-11-14 MED ORDER — DEXAMETHASONE SODIUM PHOSPHATE 10 MG/ML IJ SOLN
10.0000 mg | Freq: Once | INTRAMUSCULAR | Status: AC
  Administered 2015-11-15: 10 mg via INTRAVENOUS
  Filled 2015-11-14 (×2): qty 1

## 2015-11-14 MED ORDER — LINACLOTIDE 145 MCG PO CAPS
145.0000 ug | ORAL_CAPSULE | Freq: Every day | ORAL | Status: DC
  Administered 2015-11-15 – 2015-11-16 (×2): 145 ug via ORAL
  Filled 2015-11-14 (×2): qty 1

## 2015-11-14 SURGICAL SUPPLY — 53 items
BAG DECANTER FOR FLEXI CONT (MISCELLANEOUS) ×2 IMPLANT
BLADE SAG 18X100X1.27 (BLADE) ×2 IMPLANT
BLADE SAW SGTL 18X1.27X75 (BLADE) IMPLANT
BLADE SURG ROTATE 9660 (MISCELLANEOUS) IMPLANT
CAPT HIP TOTAL 2 ×2 IMPLANT
CLSR STERI-STRIP ANTIMIC 1/2X4 (GAUZE/BANDAGES/DRESSINGS) IMPLANT
COVER PERINEAL POST (MISCELLANEOUS) ×2 IMPLANT
COVER SURGICAL LIGHT HANDLE (MISCELLANEOUS) ×2 IMPLANT
DRAPE C-ARM 42X72 X-RAY (DRAPES) ×2 IMPLANT
DRAPE STERI IOBAN 125X83 (DRAPES) ×2 IMPLANT
DRAPE U-SHAPE 47X51 STRL (DRAPES) ×4 IMPLANT
DRSG MEPILEX BORDER 4X8 (GAUZE/BANDAGES/DRESSINGS) IMPLANT
DURAPREP 26ML APPLICATOR (WOUND CARE) ×2 IMPLANT
ELECT BLADE 4.0 EZ CLEAN MEGAD (MISCELLANEOUS) ×2
ELECT REM PT RETURN 9FT ADLT (ELECTROSURGICAL) ×2
ELECTRODE BLDE 4.0 EZ CLN MEGD (MISCELLANEOUS) ×1 IMPLANT
ELECTRODE REM PT RTRN 9FT ADLT (ELECTROSURGICAL) ×1 IMPLANT
FACESHIELD WRAPAROUND (MASK) ×6 IMPLANT
GLOVE BIO SURGEON STRL SZ7 (GLOVE) ×2 IMPLANT
GLOVE BIO SURGEON STRL SZ7.5 (GLOVE) ×8 IMPLANT
GLOVE BIOGEL PI IND STRL 7.0 (GLOVE) ×1 IMPLANT
GLOVE BIOGEL PI IND STRL 8 (GLOVE) ×2 IMPLANT
GLOVE BIOGEL PI INDICATOR 7.0 (GLOVE) ×1
GLOVE BIOGEL PI INDICATOR 8 (GLOVE) ×2
GOWN STRL REUS W/ TWL LRG LVL3 (GOWN DISPOSABLE) ×4 IMPLANT
GOWN STRL REUS W/ TWL XL LVL3 (GOWN DISPOSABLE) ×2 IMPLANT
GOWN STRL REUS W/TWL LRG LVL3 (GOWN DISPOSABLE) ×4
GOWN STRL REUS W/TWL XL LVL3 (GOWN DISPOSABLE) ×2
KIT BASIN OR (CUSTOM PROCEDURE TRAY) ×2 IMPLANT
KIT PREVENA INCISION MGT 13 (CANNISTER) ×2 IMPLANT
KIT ROOM TURNOVER OR (KITS) ×2 IMPLANT
MANIFOLD NEPTUNE II (INSTRUMENTS) ×2 IMPLANT
NDL SAFETY ECLIPSE 18X1.5 (NEEDLE) IMPLANT
NEEDLE 18GX1X1/2 (RX/OR ONLY) (NEEDLE) IMPLANT
NEEDLE HYPO 18GX1.5 SHARP (NEEDLE)
NS IRRIG 1000ML POUR BTL (IV SOLUTION) ×2 IMPLANT
PACK TOTAL JOINT (CUSTOM PROCEDURE TRAY) ×2 IMPLANT
PACK UNIVERSAL I (CUSTOM PROCEDURE TRAY) ×2 IMPLANT
PAD ARMBOARD 7.5X6 YLW CONV (MISCELLANEOUS) ×2 IMPLANT
SPONGE LAP 18X18 X RAY DECT (DISPOSABLE) IMPLANT
SUT MNCRL AB 4-0 PS2 18 (SUTURE) ×2 IMPLANT
SUT MON AB 2-0 CT1 36 (SUTURE) ×4 IMPLANT
SUT VIC AB 0 CT1 27 (SUTURE) ×1
SUT VIC AB 0 CT1 27XBRD ANBCTR (SUTURE) ×1 IMPLANT
SUT VIC AB 1 CT1 27 (SUTURE) ×1
SUT VIC AB 1 CT1 27XBRD ANBCTR (SUTURE) ×1 IMPLANT
SYR 50ML LL SCALE MARK (SYRINGE) IMPLANT
SYRINGE 20CC LL (MISCELLANEOUS) IMPLANT
TOWEL OR 17X24 6PK STRL BLUE (TOWEL DISPOSABLE) ×2 IMPLANT
TOWEL OR 17X26 10 PK STRL BLUE (TOWEL DISPOSABLE) ×2 IMPLANT
TRAY FOLEY CATH 16FRSI W/METER (SET/KITS/TRAYS/PACK) ×2 IMPLANT
WATER STERILE IRR 1000ML POUR (IV SOLUTION) IMPLANT
YANKAUER SUCT BULB TIP NO VENT (SUCTIONS) ×2 IMPLANT

## 2015-11-14 NOTE — Anesthesia Preprocedure Evaluation (Signed)
Anesthesia Evaluation  Patient identified by MRN, date of birth, ID band Patient awake    Reviewed: Allergy & Precautions, H&P , NPO status , Patient's Chart, lab work & pertinent test results  Airway Mallampati: II   Neck ROM: full    Dental   Pulmonary former smoker,    breath sounds clear to auscultation       Cardiovascular hypertension,  Rhythm:regular Rate:Normal     Neuro/Psych Anxiety Depression CVA    GI/Hepatic   Endo/Other  diabetes, Type 2Hypothyroidism Morbid obesity  Renal/GU      Musculoskeletal  (+) Arthritis ,   Abdominal   Peds  Hematology   Anesthesia Other Findings   Reproductive/Obstetrics                             Anesthesia Physical Anesthesia Plan  ASA: III  Anesthesia Plan: General   Post-op Pain Management:    Induction: Intravenous  Airway Management Planned: Oral ETT  Additional Equipment:   Intra-op Plan:   Post-operative Plan: Extubation in OR  Informed Consent: I have reviewed the patients History and Physical, chart, labs and discussed the procedure including the risks, benefits and alternatives for the proposed anesthesia with the patient or authorized representative who has indicated his/her understanding and acceptance.     Plan Discussed with: CRNA, Anesthesiologist and Surgeon  Anesthesia Plan Comments:         Anesthesia Quick Evaluation

## 2015-11-14 NOTE — Interval H&P Note (Signed)
History and Physical Interval Note:  11/14/2015 7:33 AM  Phillip Turner  has presented today for surgery, with the diagnosis of OA LEFT HIP  The various methods of treatment have been discussed with the patient and family. After consideration of risks, benefits and other options for treatment, the patient has consented to  Procedure(s): LEFT TOTAL HIP ARTHROPLASTY ANTERIOR APPROACH (Left) as a surgical intervention .  The patient's history has been reviewed, patient examined, no change in status, stable for surgery.  I have reviewed the patient's chart and labs.  Questions were answered to the patient's satisfaction.     MURPHY, TIMOTHY D

## 2015-11-14 NOTE — Progress Notes (Signed)
Dr Percell Miller at bedside to assess swelling around left anterior hip around wound vac site no drainage noted in wound vac no new rx or orders obtained will continue to monitor site

## 2015-11-14 NOTE — Evaluation (Signed)
Physical Therapy Evaluation Patient Details Name: Phillip Turner MRN: [id number] DOB: 08/11/1960 Today's Date: 11/14/2015   History of Present Illness  Pt underwent L THR via anterior approach. Per MD, pt with anterior hip precautions  Clinical Impression  Pt is s/p THA resulting in the deficits listed below (see PT Problem List). Per Dr. Percell Miller pt to be on anterior hip precautions. Pt with good understanding and carry over. Pt will benefit from skilled PT to increase their independence and safety with mobility to allow discharge to the venue listed below.      Follow Up Recommendations Home health PT;Supervision - Intermittent    Equipment Recommendations  Rolling walker with 5" wheels (wide walker)    Recommendations for Other Services       Precautions / Restrictions Precautions Precautions: Anterior Hip Precaution Booklet Issued: Yes (comment) Precaution Comments: pt with verbal understanding and good carryover Restrictions Weight Bearing Restrictions: Yes LLE Weight Bearing: Weight bearing as tolerated      Mobility  Bed Mobility Overal bed mobility: Modified Independent             General bed mobility comments: HOB elevated  Transfers Overall transfer level: Needs assistance Equipment used: Rolling walker (2 wheeled) Transfers: Sit to/from Stand Sit to Stand: Supervision         General transfer comment: v/c's for hand placement (push up from bed)  Ambulation/Gait Ambulation/Gait assistance: Min guard Ambulation Distance (Feet): 100 Feet Assistive device: Rolling walker (2 wheeled) Gait Pattern/deviations: Step-through pattern;Decreased stride length Gait velocity: slow Gait velocity interpretation: Below normal speed for age/gender General Gait Details: pt reports placing about 50% WBing through L LE. v/c's for proper sequencing and then transitioning to fluid, reciprocal gait pattern, v/c's to push walker instead of pick up  Stairs            Wheelchair Mobility    Modified Rankin (Stroke Patients Only)       Balance Overall balance assessment: No apparent balance deficits (not formally assessed)                                           Pertinent Vitals/Pain Pain Assessment: 0-10 Pain Score: 5  Pain Location: L hip Pain Descriptors / Indicators: Operative site guarding;Sore Pain Intervention(s): Monitored during session    Home Living Family/patient expects to be discharged to:: Private residence Living Arrangements: Spouse/significant other Available Help at Discharge: Family;Available 24 hours/day Type of Home: House Home Access: Stairs to enter Entrance Stairs-Rails: Right Entrance Stairs-Number of Steps: 4 Home Layout: One level Home Equipment: None      Prior Function Level of Independence: Independent               Hand Dominance   Dominant Hand: Right    Extremity/Trunk Assessment   Upper Extremity Assessment: Overall WFL for tasks assessed           Lower Extremity Assessment: LLE deficits/detail   LLE Deficits / Details: pt able to intiate movement into hip flexion, abd (within precautions) and do LAQ  Cervical / Trunk Assessment: Normal  Communication   Communication: No difficulties  Cognition Arousal/Alertness: Awake/alert Behavior During Therapy: WFL for tasks assessed/performed Overall Cognitive Status: Within Functional Limits for tasks assessed                      General Comments  Exercises        Assessment/Plan    PT Assessment Patient needs continued PT services  PT Diagnosis Difficulty walking;Generalized weakness   PT Problem List Decreased strength;Decreased range of motion;Decreased activity tolerance;Decreased balance;Decreased mobility  PT Treatment Interventions DME instruction;Gait training;Functional mobility training;Stair training;Therapeutic activities;Therapeutic exercise   PT Goals (Current goals can be  found in the Care Plan section) Acute Rehab PT Goals Patient Stated Goal: home PT Goal Formulation: With patient Time For Goal Achievement: 11/21/15 Potential to Achieve Goals: Good    Frequency 7X/week   Barriers to discharge        Co-evaluation               End of Session Equipment Utilized During Treatment: Gait belt Activity Tolerance: Patient tolerated treatment well Patient left: in chair;with call bell/phone within reach Nurse Communication: Mobility status         Time: [id number] PT Time Calculation (min) (ACUTE ONLY): 24 min   Charges:   PT Evaluation $PT Eval Moderate Complexity: 1 Procedure PT Treatments $Gait Training: 8-22 mins   PT G CodesKingsley Callander 11/14/2015, 4:13 PM   Kittie Plater, PT, DPT Pager #: [phone] Office #: [phone]

## 2015-11-14 NOTE — Anesthesia Procedure Notes (Signed)
Spinal Patient location during procedure: OR Start time: 11/14/2015 7:39 AM End time: 11/14/2015 7:44 AM Staffing Anesthesiologist: Tyria Springer Performed by: anesthesiologist  Preanesthetic Checklist Completed: patient identified, site marked, surgical consent, pre-op evaluation, timeout performed, IV checked, risks and benefits discussed and monitors and equipment checked Spinal Block Patient position: sitting Prep: Betadine and site prepped and draped Patient monitoring: heart rate, cardiac monitor, continuous pulse ox and blood pressure Approach: midline Location: L3-4 Injection technique: single-shot Needle Needle type: Pencan  Needle gauge: 24 G Needle length: 10 cm Assessment Sensory level: T6 Additional Notes Pt tolerated the procedure well.

## 2015-11-14 NOTE — Transfer of Care (Signed)
Immediate Anesthesia Transfer of Care Note  Patient: Lennice Sites  Procedure(s) Performed: Procedure(s): LEFT TOTAL HIP ARTHROPLASTY ANTERIOR APPROACH (Left)  Patient Location: PACU  Anesthesia Type:MAC and Spinal  Level of Consciousness: awake, alert  and oriented  Airway & Oxygen Therapy: Patient Spontanous Breathing and Patient connected to nasal cannula oxygen  Post-op Assessment: Report given to RN and Post -op Vital signs reviewed and stable  Post vital signs: Reviewed and stable  Last Vitals:  Filed Vitals:   11/14/15 0620  BP: 141/64  Pulse: 97  Temp: 36.7 C  Resp: 20    Last Pain:  Filed Vitals:   11/14/15 0622  PainSc: 8       Patients Stated Pain Goal: 5 ([id number])  Complications: No apparent anesthesia complications

## 2015-11-14 NOTE — Op Note (Signed)
11/14/2015  10:12 AM  PATIENT:  Phillip Turner   MRN: [id number]  PRE-OPERATIVE DIAGNOSIS:  Osteoarthritis LEFT HIP  POST-OPERATIVE DIAGNOSIS:  Osteoarthritis LEFT HIP  PROCEDURE:  Procedure(s): LEFT TOTAL HIP ARTHROPLASTY ANTERIOR APPROACH  PREOPERATIVE INDICATIONS:    Phillip Turner is an 55 y.o. male who has a diagnosis of Primary osteoarthritis of left hip and elected for surgical management after failing conservative treatment.  The risks benefits and alternatives were discussed with the patient including but not limited to the risks of nonoperative treatment, versus surgical intervention including infection, bleeding, nerve injury, periprosthetic fracture, the need for revision surgery, dislocation, leg length discrepancy, blood clots, cardiopulmonary complications, morbidity, mortality, among others, and they were willing to proceed.     OPERATIVE REPORT     SURGEON:   MURPHY, Ernesta Amble, MD    ASSISTANT:  Roxan Hockey, PA-C, She was present and scrubbed throughout the case, critical for completion in a timely fashion, and for retraction, instrumentation, and closure.     ANESTHESIA:  General    COMPLICATIONS:  None.     COMPONENTS:  [employer]ker acolade fit femur size 5 with a 36 mm +2.5 head ball and a PSL acetabular shell size 52 with a  polyethylene liner    PROCEDURE IN DETAIL:   The patient was met in the holding area and  identified.  The appropriate hip was identified and marked at the operative site.  The patient was then transported to the OR  and  placed under general anesthesia.  At that point, the patient was  placed in the supine position and  secured to the operating room table and all bony prominences padded. He received pre-operative antibiotics    The operative lower extremity was prepped from the iliac crest to the distal leg.  Sterile draping was performed.  Time out was performed prior to incision.      Skin incision was made just 2 cm lateral to  the ASIS  extending in line with the tensor fascia lata. Electrocautery was used to control all bleeders. I dissected down sharply to the fascia of the tensor fascia lata was confirmed that the muscle fibers beneath were running posteriorly. I then incised the fascia over the superficial tensor fascia lata in line with the incision. The fascia was elevated off the anterior aspect of the muscle the muscle was retracted posteriorly and protected throughout the case. I then used electrocautery to incise the tensor fascia lata fascia control and all bleeders. Immediately visible was the fat over top of the anterior neck and capsule.  I removed the anterior fat from the capsule and elevated the rectus muscle off of the anterior capsule. I then removed a large time of capsule. The retractors were then placed over the anterior acetabulum as well as around the superior and inferior neck.  I then removed a section of the femoral neck and a napkin ring fashion. Then used the power course to remove the femoral head from the acetabulum and thoroughly irrigated the acetabulum. I sized the femoral head.    I then exposed the deep acetabulum, cleared out any tissue including the ligamentum teres.   After adequate visualization, I excised the labrum, and then sequentially reamed.  I placed the trial acetabulum, which seated nicely, and then impacted the real cup into place.  Appropriate version and inclination was confirmed clinically matching their bony anatomy, and also with the use transverse acetabular ligament.  I placed a 68m screw in the  posterior/superio position with an excellent bite.    I then placed the polyethylene liner in place  I then abducted the leg and released the external rotators from the posterior femur allowing it to be easily delivered up lateral and anterior to the acetabulum for preparation of the femoral canal.    I then prepared the proximal femur using the cookie-cutter and then  sequentially reamed and broached.  A trial broach, neck, and head was utilized, and I reduced the hip and it was found to have excellent stability with functional range of motion..  I then impacted the real femoral prosthesis into place into the appropriate version, slightly anteverted to the normal anatomy, and I impacted the real head ball into place. The hip was then reduced and taken through functional range of motion and found to have excellent stability. Leg lengths were restored.  I then irrigated the hip copiously again with, and repaired the fascia with Vicryl, followed by monocryl for the subcutaneous tissue, Monocryl for the skin, Steri-Strips and sterile gauze. The patient was then awakened and returned to PACU in stable and satisfactory condition. There were no complications.  POST OPERATIVE PLAN: WBAT, DVT px: SCD's/TED and ASA 325  Edmonia Lynch, MD Orthopedic Surgeon [phone]   This note was generated using a template and dragon dictation system. In light of that, I have reviewed the note and all aspects of it are applicable to this case. Any dictation errors are due to the computerized dictation system.

## 2015-11-15 ENCOUNTER — Encounter (HOSPITAL_COMMUNITY): Payer: Self-pay | Admitting: Orthopedic Surgery

## 2015-11-15 LAB — CBC
HEMATOCRIT: 36.8 % — AB (ref 39.0–52.0)
HEMOGLOBIN: 12.2 g/dL — AB (ref 13.0–17.0)
MCH: 26.3 pg (ref 26.0–34.0)
MCHC: 33.2 g/dL (ref 30.0–36.0)
MCV: 79.3 fL (ref 78.0–100.0)
Platelets: 339 10*3/uL (ref 150–400)
RBC: 4.64 MIL/uL (ref 4.22–5.81)
RDW: 15.5 % (ref 11.5–15.5)
WBC: 13.4 10*3/uL — ABNORMAL HIGH (ref 4.0–10.5)

## 2015-11-15 LAB — GLUCOSE, CAPILLARY
GLUCOSE-CAPILLARY: 139 mg/dL — AB (ref 65–99)
Glucose-Capillary: 117 mg/dL — ABNORMAL HIGH (ref 65–99)
Glucose-Capillary: 133 mg/dL — ABNORMAL HIGH (ref 65–99)
Glucose-Capillary: 157 mg/dL — ABNORMAL HIGH (ref 65–99)
Glucose-Capillary: 172 mg/dL — ABNORMAL HIGH (ref 65–99)
Glucose-Capillary: 197 mg/dL — ABNORMAL HIGH (ref 65–99)

## 2015-11-15 NOTE — Care Management Note (Addendum)
Case Management Note  Patient Details  Name: Phillip Turner MRN: [id number] Date of Birth: 1961/03/21  Subjective/Objective:    55 yr old male s/p left hip arthroplasty, anterior approach.                Action/Plan: Case manager spoke with patient concerning home health and DME needs. Patient was preopertively setup with [place], no changes. Wide rolling walker and 3in1 have been ordered. Patient states his wife and daughters will assist hime at discharge.   Expected Discharge Date:   11/15/15               Expected Discharge Plan:  [place]  In-House Referral:     Discharge planning Services  CM Consult  Post Acute Care Choice:  Durable Medical Equipment, Home Health Choice offered to:  Patient  DME Arranged:  3-N-1, Walker rolling DME Agency:  [place]:  PT [place] Agency:  [facility] (formerly [place] Status of Service:  Completed, signed off  If discussed at [employer], dates discussed:    Additional Comments:  Ninfa Meeker, RN 11/15/2015, 11:45 AM

## 2015-11-15 NOTE — Progress Notes (Signed)
Physical Therapy Treatment Patient Details Name: Phillip Turner MRN: [id number] DOB: [birth year] Today's Date: 11/15/2015    History of Present Illness Pt underwent L THR via anterior approach. Per MD, pt with anterior hip precautions    PT Comments    Pt performed increased gait required max VCs to correct sequencing and postural awareness.  PTA educated patient on HEP for continued strengthening and reviewed anterior hip precautions.    Follow Up Recommendations  Home health PT;Supervision - Intermittent     Equipment Recommendations  Rolling walker with 5" wheels (bariatric RW.  )    Recommendations for Other Services       Precautions / Restrictions Precautions Precautions: Anterior Hip;Fall Precaution Booklet Issued: Yes (comment) Precaution Comments: pt educated in anterior hip precautions related to bed mobility and ADL Restrictions Weight Bearing Restrictions: Yes LLE Weight Bearing: Weight bearing as tolerated Other Position/Activity Restrictions: wound vac on L hip    Mobility  Bed Mobility Overal bed mobility: Modified Independent             General bed mobility comments: Pt received sitting edge of bed.    Transfers Overall transfer level: Needs assistance Equipment used: Rolling walker (2 wheeled) Transfers: Sit to/from Stand Sit to Stand: Supervision         General transfer comment: VCs for hand placement, slow to ascend from seated surface.    Ambulation/Gait Ambulation/Gait assistance: Supervision;Min guard Ambulation Distance (Feet): 120 Feet   Gait Pattern/deviations: Step-to pattern;Step-through pattern Gait velocity: slow   General Gait Details: Pt required cues to defer from step through and lead with LLE to avoid extending LLE during gait sequencing.  Poor foot clearance noted on L side.     Stairs Stairs: Yes Stairs assistance: Min guard Stair Management: One rail Right;Backwards;Forwards;Step to pattern Number of Stairs:  5 General stair comments: Cues for sequencing and hand placement on rails.  Forward to ascend and backwards to descend.  Cues to avoid excessive extension during descent.   Wheelchair Mobility    Modified Rankin (Stroke Patients Only)       Balance Overall balance assessment: No apparent balance deficits (not formally assessed)                                  Cognition Arousal/Alertness: Awake/alert Behavior During Therapy: WFL for tasks assessed/performed Overall Cognitive Status: Within Functional Limits for tasks assessed                      Exercises Total Joint Exercises Ankle Circles/Pumps: AROM;Both;10 reps;Supine Quad Sets: AROM;Left;10 reps;Supine Short Arc Quad: AROM;Left;10 reps;Supine Heel Slides: AAROM;Left;10 reps;Supine (cues to avoid ER.  ) Long Arc Quad: AROM;Left;10 reps;Seated Knee Flexion: AROM;Left;10 reps;Standing Marching in Standing: AROM;Left;10 reps;Standing;AAROM    General Comments        Pertinent Vitals/Pain Pain Assessment: Faces Pain Score: 5  Faces Pain Scale: Hurts little more Pain Location: L Hip (Pt sweating profusely.  ) Pain Descriptors / Indicators: Grimacing;Guarding Pain Intervention(s): Monitored during session;Repositioned    Home Living Family/patient expects to be discharged to:: Private residence Living Arrangements: Spouse/significant other;Children Available Help at Discharge: Family;Available 24 hours/day Type of Home: House Home Access: Stairs to enter Entrance Stairs-Rails: Right Home Layout: One level Home Equipment: Shower seat;Cane - single point      Prior Function Level of Independence: Independent with assistive device(s)  Gait / Transfers Assistance Needed: cane ADL's /  Homemaking Assistance Needed: struggled with LB dressing     PT Goals (current goals can now be found in the care plan section) Acute Rehab PT Goals Patient Stated Goal: home Potential to Achieve Goals:  Good Progress towards PT goals: Progressing toward goals    Frequency  7X/week    PT Plan Current plan remains appropriate    Co-evaluation             End of Session Equipment Utilized During Treatment: Gait belt Activity Tolerance: Patient tolerated treatment well Patient left: in chair;with call bell/phone within reach     Time: 1422-1457 PT Time Calculation (min) (ACUTE ONLY): 35 min  Charges:  $Gait Training: 8-22 mins                    G Codes:      Cristela Blue 12/14/2015, 3:22 PM Governor Rooks, PTA pager [phone]

## 2015-11-15 NOTE — Discharge Instructions (Signed)
INSTRUCTIONS AFTER JOINT REPLACEMENT   o Remove items at home which could result in a fall. This includes throw rugs or furniture in walking pathways o ICE to the affected joint every three hours while awake for 30 minutes at a time, for at least the first 3-5 days, and then as needed for pain and swelling.  Continue to use ice for pain and swelling. You may notice swelling that will progress down to the foot and ankle.  This is normal after surgery.  Elevate your leg when you are not up walking on it.   o Continue to use the breathing machine you got in the hospital (incentive spirometer) which will help keep your temperature down.  It is common for your temperature to cycle up and down following surgery, especially at night when you are not up moving around and exerting yourself.  The breathing machine keeps your lungs expanded and your temperature down.   DIET:  As you were doing prior to hospitalization, we recommend a well-balanced diet.  DRESSING / WOUND CARE / SHOWERING  Keep the surgical dressing until follow up.  If/when the battery to the suction stops, change the dressing with sterile gauze / adhesive dressing.  Please use good hand washing techniques before changing the dressing.  Do not use any lotions or creams on the incision until instructed by your surgeon.    ACTIVITY  o Increase activity slowly as tolerated, but follow the weight bearing instructions below.   o No driving for 6 weeks or until further direction given by your physician.  You cannot drive while taking narcotics.  o No lifting or carrying greater than 10 lbs. until further directed by your surgeon. o Avoid periods of inactivity such as sitting longer than an hour when not asleep. This helps prevent blood clots.  o You may return to work once you are authorized by your doctor.     WEIGHT BEARING   Weight bearing as tolerated with assist device (walker, cane, etc) as directed, use it as long as suggested by your  surgeon or therapist, typically at least 4-6 weeks.   EXERCISES  Results after joint replacement surgery are often greatly improved when you follow the exercise, range of motion and muscle strengthening exercises prescribed by your doctor. Safety measures are also important to protect the joint from further injury. Any time any of these exercises cause you to have increased pain or swelling, decrease what you are doing until you are comfortable again and then slowly increase them. If you have problems or questions, call your caregiver or physical therapist for advice.   Rehabilitation is important following a joint replacement. After just a few days of immobilization, the muscles of the leg can become weakened and shrink (atrophy).  These exercises are designed to build up the tone and strength of the thigh and leg muscles and to improve motion. Often times heat used for twenty to thirty minutes before working out will loosen up your tissues and help with improving the range of motion but do not use heat for the first two weeks following surgery (sometimes heat can increase post-operative swelling).   These exercises can be done on a training (exercise) mat, on the floor, on a table or on a bed. Use whatever works the best and is most comfortable for you.    Use music or television while you are exercising so that the exercises are a pleasant break in your day. This will make your life better  with the exercises acting as a break in your routine that you can look forward to.   Perform all exercises about fifteen times, three times per day or as directed.  You should exercise both the operative leg and the other leg as well. ° °Exercises include: °  °• Quad Sets - Tighten up the muscle on the front of the thigh (Quad) and hold for 5-10 seconds.   °• Straight Leg Raises - With your knee straight (if you were given a brace, keep it on), lift the leg to 60 degrees, hold for 3 seconds, and slowly lower the leg.   Perform this exercise against resistance later as your leg gets stronger.  °• Leg Slides: Lying on your back, slowly slide your foot toward your buttocks, bending your knee up off the floor (only go as far as is comfortable). Then slowly slide your foot back down until your leg is flat on the floor again.  °• Angel Wings: Lying on your back spread your legs to the side as far apart as you can without causing discomfort.  °• Hamstring Strength:  Lying on your back, push your heel against the floor with your leg straight by tightening up the muscles of your buttocks.  Repeat, but this time bend your knee to a comfortable angle, and push your heel against the floor.  You may put a pillow under the heel to make it more comfortable if necessary.  ° °A rehabilitation program following joint replacement surgery can speed recovery and prevent re-injury in the future due to weakened muscles. Contact your doctor or a physical therapist for more information on knee rehabilitation.  ° ° °CONSTIPATION ° °Constipation is defined medically as fewer than three stools per week and severe constipation as less than one stool per week.  Even if you have a regular bowel pattern at home, your normal regimen is likely to be disrupted due to multiple reasons following surgery.  Combination of anesthesia, postoperative narcotics, change in appetite and fluid intake all can affect your bowels.  ° °YOU MUST use at least one of the following options; they are listed in order of increasing strength to get the job done.  They are all available over the counter, and you may need to use some, POSSIBLY even all of these options:   ° °Drink plenty of fluids (prune juice may be helpful) and high fiber foods °Colace 100 mg by mouth twice a day  °Senokot for constipation as directed and as needed Dulcolax (bisacodyl), take with full glass of water  °Miralax (polyethylene glycol) once or twice a day as needed. ° °If you have tried all these things and  are unable to have a bowel movement in the first 3-4 days after surgery call either your surgeon or your primary doctor.   ° °If you experience loose stools or diarrhea, hold the medications until you stool forms back up.  If your symptoms do not get better within 1 week or if they get worse, check with your doctor.  If you experience "the worst abdominal pain ever" or develop nausea or vomiting, please contact the office immediately for further recommendations for treatment. ° ° °ITCHING:  If you experience itching with your medications, try taking only a single pain pill, or even half a pain pill at a time.  You can also use Benadryl over the counter for itching or also to help with sleep.  ° °TED HOSE STOCKINGS:  Use stockings on both legs until   for at least 2 weeks or as directed by physician office. They may be removed at night for sleeping.  MEDICATIONS:  See your medication summary on the After Visit Summary that nursing will review with you.  You may have some home medications which will be placed on hold until you complete the course of blood thinner medication.  It is important for you to complete the blood thinner medication as prescribed.  PRECAUTIONS:  If you experience chest pain or shortness of breath - call 911 immediately for transfer to the hospital emergency department.   If you develop a fever greater that 101 F, purulent drainage from wound, increased redness or drainage from wound, foul odor from the wound/dressing, or calf pain - CONTACT YOUR SURGEON.                                                   FOLLOW-UP APPOINTMENTS:  If you do not already have a post-op appointment, please call the office for an appointment to be seen by your surgeon.  Guidelines for how soon to be seen are listed in your After Visit Summary, but are typically between 1-4 weeks after surgery.  OTHER INSTRUCTIONS:   MAKE SURE YOU:   Understand these instructions.   Get help right away if you are not  doing well or get worse.    Thank you for letting us be a part of your medical care team.  It is a privilege we respect greatly.  We hope these instructions will help you stay on track for a fast and full recovery!   Information on my medicine - XARELTO (Rivaroxaban)  This medication education was reviewed with me or my healthcare representative as part of my discharge preparation.   Why was Xarelto prescribed for you? Xarelto was prescribed for you to reduce the risk of blood clots forming after orthopedic surgery. The medical term for these abnormal blood clots is venous thromboembolism (VTE).  What do you need to know about xarelto ? Take your Xarelto ONCE DAILY at the same time every day. You may take it either with or without food.  If you have difficulty swallowing the tablet whole, you may crush it and mix in applesauce just prior to taking your dose.  Take Xarelto exactly as prescribed by your doctor and DO NOT stop taking Xarelto without talking to the doctor who prescribed the medication.  Stopping without other VTE prevention medication to take the place of Xarelto may increase your risk of developing a clot.  After discharge, you should have regular check-up appointments with your healthcare provider that is prescribing your Xarelto.    What do you do if you miss a dose? If you miss a dose, take it as soon as you remember on the same day then continue your regularly scheduled once daily regimen the next day. Do not take two doses of Xarelto on the same day.   Important Safety Information A possible side effect of Xarelto is bleeding. You should call your healthcare provider right away if you experience any of the following: ? Bleeding from an injury or your nose that does not stop. ? Unusual colored urine (red or dark brown) or unusual colored stools (red or black). ? Unusual bruising for unknown reasons. ? A serious fall or if you hit your head (even if there  is  no bleeding).  Some medicines may interact with Xarelto and might increase your risk of bleeding while on Xarelto. To help avoid this, consult your healthcare provider or pharmacist prior to using any new prescription or non-prescription medications, including herbals, vitamins, non-steroidal anti-inflammatory drugs (NSAIDs) and supplements.  This website has more information on Xarelto: [email or web address]-benson.[email or web address]

## 2015-11-15 NOTE — Progress Notes (Signed)
PT Cancellation Note  Patient Details Name: Phillip Turner MRN: [id number] DOB: Aug 26, 1960   Cancelled Treatment:    Reason Eval/Treat Not Completed: Patient declined, no reason specified (Pt reports he did not sleep well and he will perform therapy later this pm.  )   Samaad Hashem Eli Hose 11/15/2015, 11:48 AM  Governor Rooks, PTA pager [phone]

## 2015-11-15 NOTE — Care Management Important Message (Signed)
Important Message  Patient Details  Name: Phillip Turner MRN: [id number] Date of Birth: [birth year]   Medicare Important Message Given:  Yes    Loann Quill 11/15/2015, 11:09 AM

## 2015-11-15 NOTE — Progress Notes (Signed)
RN left message with Dr. Debroah Loop PA-HD stating that pt requested to stay another day. He feels that he has not done well enough with therapy and just "is not ready"-although he asked the PT to "come back later" when they attempted session today.   [name], Phillip Turner

## 2015-11-15 NOTE — Discharge Summary (Signed)
Discharge Summary  Patient ID: Phillip Turner MRN: [id number] DOB/AGE: 06-Jan-1961 55 y.o.  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 Diabetes mellitus   . Hypertension   . Hypothyroidism   . Gout   . Arthritis   . Hyperlipidemia   . Rheumatic fever     as a teenager  . Stroke [facility])     does not remember when  . 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 Substance abuse     former cocaine user- not since March 2014  . Depression     past depression relating to drug use   . Anxiety     from past drug use not longer having anxiety    Surgeries: Procedure(s): LEFT TOTAL HIP ARTHROPLASTY ANTERIOR APPROACH on 11/14/2015   Consultants (if any):  PT/OT  Discharged Condition: Improved  Hospital Course: Phillip Turner is an 55 y.o. male who was admitted 11/14/2015 with a diagnosis of Primary osteoarthritis of left hip and went to the operating room on 11/14/2015 and underwent the above named procedures.    Subjective: Feeling good.  OOB walking halls w/ PT.  Pain controlled with PO meds.  Tolerating diet.  Urinating.  No CP, SOB.  PE: General appearance: alert and no distress.  Lying on side watching TV. Pulm: No increased WOB Extremities: NVI.  Sensation intact distally. Dressing c/d/i.  Incisional vac in place with expected underlying hematoma/swelling - no drainage.  He was given perioperative antibiotics:  Anti-infectives    Start     Dose/Rate Route Frequency Ordered Stop   11/14/15 1500  ceFAZolin (ANCEF)  IVPB 2g/100 mL premix     2 g 200 mL/hr over 30 Minutes Intravenous Every 6 hours 11/14/15 1214 11/14/15 2142   11/14/15 0600  ceFAZolin (ANCEF) 3 g in dextrose 5 % 50 mL IVPB     3 g 130 mL/hr over 30 Minutes Intravenous On call to O.R. 11/13/15 1423 11/14/15 0753    .  He was given sequential compression devices, early ambulation, and Xarelto for DVT prophylaxis.  He benefited maximally from the hospital stay and there were no complications.    Recent vital signs:  Filed Vitals:   11/15/15 0414 11/15/15 0605  BP: 130/54   Pulse: 90   Temp: 100.8 F (38.2 C) 99.4 F (37.4 C)  Resp: 18     Recent laboratory studies:  Lab Results  Component Value Date   HGB 12.2* 11/15/2015   HGB 14.7 11/06/2015   HGB 14.2 01/21/2015   Lab Results  Component Value Date   WBC 13.4* 11/15/2015   PLT 339 11/15/2015   Lab Results  Component Value Date   INR 1.05 11/06/2015   Lab Results  Component Value Date   NA 141 11/06/2015   K 3.3* 11/06/2015   CL 107 11/06/2015   CO2 26 11/06/2015   BUN 6 11/06/2015   CREATININE 1.16 11/06/2015   GLUCOSE 87 11/06/2015    Discharge Medications:     Medication List  STOP taking these medications        aspirin 325 MG tablet     hydrochlorothiazide 25 MG tablet  Commonly known as:  HYDRODIURIL     lisinopril 20 MG tablet  Commonly known as:  PRINIVIL,ZESTRIL      TAKE these medications        amLODipine 10 MG tablet  Commonly known as:  NORVASC  TAKE 1 TABLET BY MOUTH EVERY DAY FOR BLOOD PRESSURE     colchicine 0.6 MG tablet  TAKE 1 TABLET BY MOUTH TWICE DAILY FOR GOUT FLARE-UPS     diclofenac sodium 1 % Gel  Commonly known as:  VOLTAREN  Apply 1 application topically 2 (two) times daily as needed.     docusate sodium 100 MG capsule  Commonly known as:  COLACE  Take 1 capsule (100 mg total) by mouth 2 (two) times daily.     EDARBYCLOR 40-25 MG Tabs  Generic drug:  Azilsartan-Chlorthalidone  Take 1 tablet by mouth  daily.     empagliflozin 10 MG Tabs tablet  Commonly known as:  JARDIANCE  Take 10 mg by mouth daily.     furosemide 40 MG tablet  Commonly known as:  LASIX  Take 1 tablet (40 mg total) by mouth daily as needed for edema.     glipiZIDE 5 MG tablet  Commonly known as:  GLUCOTROL  Take 5 mg by mouth 2 (two) times daily before a meal.     HYDROcodone-acetaminophen 5-325 MG tablet  Commonly known as:  NORCO/VICODIN  Take 1-2 tablets by mouth every 6 (six) hours as needed.     levothyroxine 100 MCG tablet  Commonly known as:  SYNTHROID, LEVOTHROID  Take 112 mcg by mouth daily.     LINZESS 145 MCG Caps capsule  Generic drug:  linaclotide  Take 1 tablet by mouth daily.     LYRICA 75 MG capsule  Generic drug:  pregabalin  Take 75 mg by mouth daily.     methocarbamol 500 MG tablet  Commonly known as:  ROBAXIN  Take 1 tablet (500 mg total) by mouth every 6 (six) hours as needed for muscle spasms.     metoprolol succinate 25 MG 24 hr tablet  Commonly known as:  TOPROL-XL  Take 25 mg by mouth daily.     ondansetron 4 MG tablet  Commonly known as:  ZOFRAN  Take 1 tablet (4 mg total) by mouth every 8 (eight) hours as needed for nausea or vomiting.     oxyCODONE-acetaminophen 5-325 MG tablet  Commonly known as:  ROXICET  Take 1-2 tablets by mouth every 4 (four) hours as needed for severe pain.     potassium chloride SA 20 MEQ tablet  Commonly known as:  K-DUR,KLOR-CON  Take 2 tablets (40 mEq total) by mouth 2 (two) times daily.     rivaroxaban 10 MG Tabs tablet  Commonly known as:  XARELTO  Take 1 tablet (10 mg total) by mouth daily.     sildenafil 100 MG tablet  Commonly known as:  VIAGRA  Take 100 mg by mouth as needed for erectile dysfunction.     simvastatin 10 MG tablet  Commonly known as:  ZOCOR  Take 1 tablet by mouth daily.     traZODone 100 MG tablet  Commonly known as:  DESYREL  TAKE 2 TABLETS BY MOUTH EVERY NIGHT AT BEDTIME FOR SLEEP/DEPRESSION         Diagnostic Studies: Dg Hip Operative Unilat W Or W/o  Pelvis Left  11/14/2015  CLINICAL DATA:  Left hip arthroplasty. EXAM: OPERATIVE LEFT HIP (WITH PELVIS IF PERFORMED) 2 VIEWS TECHNIQUE: Fluoroscopic spot image(s) were submitted for interpretation post-operatively. COMPARISON:  Hip radiograph dated 11/10/2014 FINDINGS: Two fluoroscopic images from total left hip arthroplasty are presented for interpretation. The short stem femoral component and screwed in acetabular component are in anatomic alignment. Expected postsurgical changes are seen. There is no evidence of fracture. IMPRESSION: Post total left hip arthroplasty without evidence of immediate complications. Electronically Signed   By: Fidela Salisbury M.D.   On: 11/14/2015 10:06    Disposition: 01-Home or Self Care w/ HHPT        Follow-up Information    Follow up with MURPHY, TIMOTHY D, MD In 2 weeks.   Specialty:  Orthopedic Surgery   Contact information:   [address] [place] [address] [phone]       Signed: Prudencio Burly III PA-C 11/15/2015, 6:38 AM

## 2015-11-15 NOTE — Evaluation (Signed)
Occupational Therapy Evaluation and Discharge Patient Details Name: Phillip Turner MRN: [id number] DOB: [birth year] Today's Date: 11/15/2015    History of Present Illness Pt underwent L THR via anterior approach. Per MD, pt with anterior hip precautions   Clinical Impression   Pt was performing ADL and mobility at a modified independent level prior to admission. Pt requiring supervision for toileting and standing grooming. Educate pt in anterior hip precautions related to ADL. Instructed in use of AE for LB ADL. Pt will have 24 hour assist of his family upon discharge home. Pt verbalizing understanding of safety and precautions. No further OT needs.    Follow Up Recommendations  No OT follow up    Equipment Recommendations  None recommended by OT    Recommendations for Other Services       Precautions / Restrictions Precautions Precautions: Anterior Hip;Fall Precaution Booklet Issued: Yes (comment) Precaution Comments: pt educated in anterior hip precautions related to bed mobility and ADL Restrictions Weight Bearing Restrictions: Yes LLE Weight Bearing: Weight bearing as tolerated Other Position/Activity Restrictions: wound vac on L hip      Mobility Bed Mobility Overal bed mobility: Modified Independent             General bed mobility comments: HOB elevated  Transfers Overall transfer level: Needs assistance Equipment used: Rolling walker (2 wheeled) Transfers: Sit to/from Stand Sit to Stand: Supervision         General transfer comment: VCs for hand placement    Balance                                            ADL Overall ADL's : Needs assistance/impaired Eating/Feeding: Independent;Sitting   Grooming: Wash/dry hands;Supervision/safety;Standing Grooming Details (indicate cue type and reason): places elbows on sink to wash hands Upper Body Bathing: Set up;Sitting   Lower Body Bathing: Minimal assistance;Sit to/from  stand Lower Body Bathing Details (indicate cue type and reason): recommended long bath sponge Upper Body Dressing : Set up;Sitting   Lower Body Dressing: Supervision/safety;Adhering to hip precautions;With adaptive equipment;Cueing for sequencing;Sit to/from stand Lower Body Dressing Details (indicate cue type and reason): pt demonstrated use of reacher and sock aide, educated in use of long handled shoe horn Toilet Transfer: Ambulation;RW;Comfort height toilet;Grab bars;Supervision/safety Toilet Transfer Details (indicate cue type and reason): simulated pt's bathroom set up as RW will not fit inside his small bathroom Toileting- Clothing Manipulation and Hygiene: Independent;Sit to/from Nurse, children's Details (indicate cue type and reason): pt with wound vac, no plans to shower, wife with tub seat pt can use, deferred tub transfer to HHPT Functional mobility during ADLs: Rolling walker;Supervision/safety       Vision     Perception     Praxis      Pertinent Vitals/Pain Pain Assessment: Faces Faces Pain Scale: Hurts little more Pain Location: L hip Pain Descriptors / Indicators: Guarding Pain Intervention(s): Monitored during session;Premedicated before session;Repositioned     Hand Dominance Right   Extremity/Trunk Assessment Upper Extremity Assessment Upper Extremity Assessment: Overall WFL for tasks assessed   Lower Extremity Assessment Lower Extremity Assessment: Defer to PT evaluation       Communication Communication Communication: No difficulties   Cognition Arousal/Alertness: Awake/alert Behavior During Therapy: WFL for tasks assessed/performed Overall Cognitive Status: Within Functional Limits for tasks assessed  General Comments       Exercises       Shoulder Instructions      Home Living Family/patient expects to be discharged to:: Private residence Living Arrangements: Spouse/significant  other;Children Available Help at Discharge: Family;Available 24 hours/day Type of Home: House Home Access: Stairs to enter [employer] of Steps: 4 Entrance Stairs-Rails: Right Home Layout: One level     Bathroom Shower/Tub: Teacher, early years/pre: Handicapped height Bathroom Accessibility: No   Home Equipment: Marine scientist - single point          Prior Functioning/Environment Level of Independence: Independent with assistive device(s)  Gait / Transfers Assistance Needed: cane ADL's / Homemaking Assistance Needed: struggled with LB dressing        OT Diagnosis: Generalized weakness;Acute pain   OT Problem List:     OT Treatment/Interventions:      OT Goals(Current goals can be found in the care plan section) Acute Rehab OT Goals Patient Stated Goal: home  OT Frequency:     Barriers to D/C:            Co-evaluation              End of Session Equipment Utilized During Treatment: Rolling walker  Activity Tolerance: Patient tolerated treatment well Patient left:  (with PT walking)   Time: [id number] OT Time Calculation (min): 37 min Charges:  OT General Charges $OT Visit: 1 Procedure OT Evaluation $OT Eval Moderate Complexity: 1 Procedure OT Treatments $Self Care/Home Management : 8-22 mins G-Codes:    Malka So 11/15/2015, 2:55 PM  [phone]

## 2015-11-16 LAB — GLUCOSE, CAPILLARY
GLUCOSE-CAPILLARY: 124 mg/dL — AB (ref 65–99)
GLUCOSE-CAPILLARY: 134 mg/dL — AB (ref 65–99)
Glucose-Capillary: 261 mg/dL — ABNORMAL HIGH (ref 65–99)
Glucose-Capillary: 56 mg/dL — ABNORMAL LOW (ref 65–99)
Glucose-Capillary: 95 mg/dL (ref 65–99)

## 2015-11-16 NOTE — Progress Notes (Signed)
Hypoglycemic Event  CBG: 56  Treatment: 15 GM carbohydrate snack  Symptoms: None  Follow-up CBG: Time: 1140 CBG Result: 95  Possible Reasons for Event: Medication regimen: insulin  Comments/MD notified: No, pt not symptomatic     Zola Button

## 2015-11-16 NOTE — Discharge Summary (Signed)
Discharge Summary  Patient ID: Phillip Turner MRN: [id number] DOB/AGE: 08/23/1960 55 y.o.  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 Diabetes mellitus   . Hypertension   . Hypothyroidism   . Gout   . Arthritis   . Hyperlipidemia   . Rheumatic fever     as a teenager  . Stroke [facility])     does not remember when  . 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 Substance abuse     former cocaine user- not since March 2014  . Depression     past depression relating to drug use   . Anxiety     from past drug use not longer having anxiety    Surgeries: Procedure(s): LEFT TOTAL HIP ARTHROPLASTY ANTERIOR APPROACH on 11/14/2015   Consultants (if any):  PT/OT  Discharged Condition: Improved  Hospital Course: Phillip Turner is an 55 y.o. male who was admitted 11/14/2015 with a diagnosis of Primary osteoarthritis of left hip and went to the operating room on 11/14/2015 and underwent the above named procedures.    Subjective: Feeling better again today.  OOB walking halls / some stairs w/ PT.  Taking PO pain meds.  Eating well.  Urinating.  No CP, SOB.  Feeling fit to go home.  PE: General appearance: alert and no distress. Sitting upright in bed with breakfast. Pulm: No increased WOB Extremities: NVI.  Sensation intact distally. Dressing c/d/i.  Incisional vac in place with expected underlying soft hematoma/swelling - no ecchymosis or drainage.  He was given perioperative antibiotics:      Anti-infectives    Start      Dose/Rate Route Frequency Ordered Stop   11/14/15 1500  ceFAZolin (ANCEF) IVPB 2g/100 mL premix     2 g 200 mL/hr over 30 Minutes Intravenous Every 6 hours 11/14/15 1214 11/14/15 2142   11/14/15 0600  ceFAZolin (ANCEF) 3 g in dextrose 5 % 50 mL IVPB     3 g 130 mL/hr over 30 Minutes Intravenous On call to O.R. 11/13/15 1423 11/14/15 0753    .  He was given sequential compression devices, early ambulation, and Xarelto for DVT prophylaxis.  He benefited maximally from the hospital stay and there were no complications.    Recent vital signs:  Filed Vitals:   11/15/15 1953 11/16/15 0413  BP: 126/60 124/48  Pulse: 65 71  Temp: 98.9 F (37.2 C) 99.5 F (37.5 C)  Resp: 17 18    Recent laboratory studies:  Lab Results  Component Value Date   HGB 12.2* 11/15/2015   HGB 14.7 11/06/2015   HGB 14.2 01/21/2015   Lab Results  Component Value Date   WBC 13.4* 11/15/2015   PLT 339 11/15/2015   Lab Results  Component Value Date   INR 1.05 11/06/2015   Lab Results  Component Value Date   NA 141 11/06/2015   K 3.3* 11/06/2015   CL 107 11/06/2015   CO2 26 11/06/2015   BUN 6 11/06/2015   CREATININE 1.16 11/06/2015  GLUCOSE 87 11/06/2015    Discharge Medications:     Medication List    STOP taking these medications        aspirin 325 MG tablet     hydrochlorothiazide 25 MG tablet  Commonly known as:  HYDRODIURIL     lisinopril 20 MG tablet  Commonly known as:  PRINIVIL,ZESTRIL      TAKE these medications        amLODipine 10 MG tablet  Commonly known as:  NORVASC  TAKE 1 TABLET BY MOUTH EVERY DAY FOR BLOOD PRESSURE     colchicine 0.6 MG tablet  TAKE 1 TABLET BY MOUTH TWICE DAILY FOR GOUT FLARE-UPS     diclofenac sodium 1 % Gel  Commonly known as:  VOLTAREN  Apply 1 application topically 2 (two) times daily as needed.     docusate sodium 100 MG capsule  Commonly known as:  COLACE  Take 1 capsule (100 mg total) by mouth 2 (two) times daily.     EDARBYCLOR  40-25 MG Tabs  Generic drug:  Azilsartan-Chlorthalidone  Take 1 tablet by mouth daily.     empagliflozin 10 MG Tabs tablet  Commonly known as:  JARDIANCE  Take 10 mg by mouth daily.     furosemide 40 MG tablet  Commonly known as:  LASIX  Take 1 tablet (40 mg total) by mouth daily as needed for edema.     glipiZIDE 5 MG tablet  Commonly known as:  GLUCOTROL  Take 5 mg by mouth 2 (two) times daily before a meal.     HYDROcodone-acetaminophen 5-325 MG tablet  Commonly known as:  NORCO/VICODIN  Take 1-2 tablets by mouth every 6 (six) hours as needed.     levothyroxine 100 MCG tablet  Commonly known as:  SYNTHROID, LEVOTHROID  Take 112 mcg by mouth daily.     LINZESS 145 MCG Caps capsule  Generic drug:  linaclotide  Take 1 tablet by mouth daily.     LYRICA 75 MG capsule  Generic drug:  pregabalin  Take 75 mg by mouth daily.     methocarbamol 500 MG tablet  Commonly known as:  ROBAXIN  Take 1 tablet (500 mg total) by mouth every 6 (six) hours as needed for muscle spasms.     metoprolol succinate 25 MG 24 hr tablet  Commonly known as:  TOPROL-XL  Take 25 mg by mouth daily.     ondansetron 4 MG tablet  Commonly known as:  ZOFRAN  Take 1 tablet (4 mg total) by mouth every 8 (eight) hours as needed for nausea or vomiting.     oxyCODONE-acetaminophen 5-325 MG tablet  Commonly known as:  ROXICET  Take 1-2 tablets by mouth every 4 (four) hours as needed for severe pain.     potassium chloride SA 20 MEQ tablet  Commonly known as:  K-DUR,KLOR-CON  Take 2 tablets (40 mEq total) by mouth 2 (two) times daily.     rivaroxaban 10 MG Tabs tablet  Commonly known as:  XARELTO  Take 1 tablet (10 mg total) by mouth daily.     sildenafil 100 MG tablet  Commonly known as:  VIAGRA  Take 100 mg by mouth as needed for erectile dysfunction.     simvastatin 10 MG tablet  Commonly known as:  ZOCOR  Take 1 tablet by mouth daily.     traZODone 100 MG tablet  Commonly known as:  DESYREL   TAKE 2 TABLETS BY MOUTH EVERY NIGHT AT BEDTIME FOR  SLEEP/DEPRESSION        Diagnostic Studies: Dg Hip Operative Unilat W Or W/o Pelvis Left  11/14/2015  CLINICAL DATA:  Left hip arthroplasty. EXAM: OPERATIVE LEFT HIP (WITH PELVIS IF PERFORMED) 2 VIEWS TECHNIQUE: Fluoroscopic spot image(s) were submitted for interpretation post-operatively. COMPARISON:  Hip radiograph dated 11/10/2014 FINDINGS: Two fluoroscopic images from total left hip arthroplasty are presented for interpretation. The short stem femoral component and screwed in acetabular component are in anatomic alignment. Expected postsurgical changes are seen. There is no evidence of fracture. IMPRESSION: Post total left hip arthroplasty without evidence of immediate complications. Electronically Signed   By: Fidela Salisbury M.D.   On: 11/14/2015 10:06    Disposition: 01-Home or Self Care w/ HHPT    Follow-up Information    Follow up with MURPHY, TIMOTHY D, MD In 2 weeks.   Specialty:  Orthopedic Surgery   Contact information:   [address] [place] [phone] [phone]       Follow up with [facility]   Why:  Someone from [place] at [employer]) , will contact you to arrange start date and time for therapy.   Contact information:   [place] [address] [phone]       Signed: Prudencio Burly III PA-C 11/16/2015, 7:57 AM

## 2015-11-16 NOTE — Progress Notes (Signed)
Physical Therapy Treatment Patient Details Name: Phillip Turner MRN: [id number] DOB: 04/20/1961 Today's Date: 11/16/2015    History of Present Illness Pt underwent L THR via anterior approach. Per MD, pt with anterior hip precautions    PT Comments    Pt performed increased mobility and required cues for maintaining anterior precautions. Pt able to verbalize precautions and technique for stair training.  Pt ready for d/c at this time.  Will f/u in pm if patient remains hospitalized.    Follow Up Recommendations  Home health PT;Supervision - Intermittent     Equipment Recommendations  Rolling walker with 5" wheels (bariatric RW.  )    Recommendations for Other Services       Precautions / Restrictions Precautions Precautions: Anterior Hip;Fall Precaution Booklet Issued: Yes (comment) Precaution Comments: pt educated in anterior hip precautions related to bed mobility and ADL Restrictions Weight Bearing Restrictions: Yes LLE Weight Bearing: Weight bearing as tolerated Other Position/Activity Restrictions: wound vac on L hip    Mobility  Bed Mobility Overal bed mobility: Modified Independent                Transfers Overall transfer level: Needs assistance Equipment used: Rolling walker (2 wheeled) Transfers: Sit to/from Stand Sit to Stand: Supervision         General transfer comment: VCs for hand placement, slow to ascend from seated surface.  Heavy reliance on RW to push through for support.    Ambulation/Gait Ambulation/Gait assistance: Supervision Ambulation Distance (Feet): 130 Feet Assistive device: Rolling walker (2 wheeled) Gait Pattern/deviations: Step-through pattern;Decreased stride length;Trunk flexed Gait velocity: slow   General Gait Details: Pt required cues to defer from step through and lead with LLE to avoid extending LLE during gait sequencing.  Poor foot clearance noted on L side.  Pt able to follow step to pattern 50 % of time.      Stairs Stairs:  (declined repeated stair training.  PTA asked pt to teach back method and pt able to verbalize technique.  )          Wheelchair Mobility    Modified Rankin (Stroke Patients Only)       Balance Overall balance assessment: No apparent balance deficits (not formally assessed)                                  Cognition Arousal/Alertness: Awake/alert Behavior During Therapy: WFL for tasks assessed/performed Overall Cognitive Status: Within Functional Limits for tasks assessed                      Exercises Total Joint Exercises Ankle Circles/Pumps: AROM;Both;10 reps;Supine Quad Sets: AROM;Left;10 reps;Supine Short Arc Quad: AROM;Left;10 reps;Supine Heel Slides: AAROM;Left;10 reps;Supine Long Arc Quad: AROM;Left;10 reps;Seated Knee Flexion: AROM;Left;10 reps;Standing Marching in Standing: AROM;Left;10 reps;Standing;AAROM    General Comments        Pertinent Vitals/Pain Pain Assessment: Faces Pain Score: 8  Pain Location: L hip Pain Descriptors / Indicators: Grimacing;Guarding Pain Intervention(s): Monitored during session;Repositioned    Home Living                      Prior Function            PT Goals (current goals can now be found in the care plan section) Acute Rehab PT Goals Patient Stated Goal: home Potential to Achieve Goals: Good Progress towards PT goals: Progressing toward goals  Frequency  7X/week    PT Plan Current plan remains appropriate    Co-evaluation             End of Session Equipment Utilized During Treatment: Gait belt Activity Tolerance: Patient tolerated treatment well Patient left: in chair;with call bell/phone within reach     Time: 1003-1036 PT Time Calculation (min) (ACUTE ONLY): 33 min  Charges:  $Gait Training: 8-22 mins $Therapeutic Exercise: 8-22 mins                    G Codes:      Cristela Blue December 03, 2015, 10:45 AM  Governor Rooks, PTA pager  [phone]

## 2015-11-16 NOTE — Anesthesia Postprocedure Evaluation (Signed)
Anesthesia Post Note  Patient: Phillip Turner  Procedure(s) Performed: Procedure(s) (LRB): LEFT TOTAL HIP ARTHROPLASTY ANTERIOR APPROACH (Left)  Patient location during evaluation: PACU Anesthesia Type: Spinal Level of consciousness: oriented and awake and alert Pain management: pain level controlled Vital Signs Assessment: post-procedure vital signs reviewed and stable Respiratory status: spontaneous breathing, respiratory function stable and patient connected to nasal cannula oxygen Cardiovascular status: blood pressure returned to baseline and stable Postop Assessment: no headache and no backache Anesthetic complications: no    Last Vitals:  Filed Vitals:   11/15/15 1953 11/16/15 0413  BP: 126/60 124/48  Pulse: 65 71  Temp: 37.2 C 37.5 C  Resp: 17 18    Last Pain:  Filed Vitals:   11/16/15 0702  PainSc: [place]

## 2015-11-16 NOTE — Progress Notes (Signed)
Pt ready for discharge. Education/instructions reviewed with pt and family and all questions/concerns addressed. IV removed and belongings gathered. Pt will be transported out via wheelchair to wife's car. Will continue to monitor

## 2015-11-21 ENCOUNTER — Encounter (HOSPITAL_COMMUNITY): Payer: Self-pay | Admitting: Orthopedic Surgery

## 2015-12-26 ENCOUNTER — Emergency Department (HOSPITAL_COMMUNITY): Payer: Medicare HMO

## 2015-12-26 ENCOUNTER — Emergency Department (HOSPITAL_COMMUNITY)
Admission: EM | Admit: 2015-12-26 | Discharge: 2015-12-27 | Disposition: A | Discharge status: Home / Self Care | Payer: Medicare HMO | Attending: Emergency Medicine | Admitting: Emergency Medicine

## 2015-12-26 ENCOUNTER — Encounter (HOSPITAL_COMMUNITY): Payer: Self-pay

## 2015-12-26 DIAGNOSIS — S73035A Other anterior dislocation of left hip, initial encounter: Secondary | ICD-10-CM | POA: Insufficient documentation

## 2015-12-26 DIAGNOSIS — Z791 Long term (current) use of non-steroidal anti-inflammatories (NSAID): Secondary | ICD-10-CM | POA: Insufficient documentation

## 2015-12-26 DIAGNOSIS — Z87891 Personal history of nicotine dependence: Secondary | ICD-10-CM | POA: Diagnosis not present

## 2015-12-26 DIAGNOSIS — I10 Essential (primary) hypertension: Secondary | ICD-10-CM | POA: Insufficient documentation

## 2015-12-26 DIAGNOSIS — Y999 Unspecified external cause status: Secondary | ICD-10-CM | POA: Diagnosis not present

## 2015-12-26 DIAGNOSIS — Z7984 Long term (current) use of oral hypoglycemic drugs: Secondary | ICD-10-CM | POA: Insufficient documentation

## 2015-12-26 DIAGNOSIS — E039 Hypothyroidism, unspecified: Secondary | ICD-10-CM | POA: Diagnosis not present

## 2015-12-26 DIAGNOSIS — X501XXA Overexertion from prolonged static or awkward postures, initial encounter: Secondary | ICD-10-CM | POA: Insufficient documentation

## 2015-12-26 DIAGNOSIS — Y929 Unspecified place or not applicable: Secondary | ICD-10-CM | POA: Diagnosis not present

## 2015-12-26 DIAGNOSIS — E119 Type 2 diabetes mellitus without complications: Secondary | ICD-10-CM | POA: Diagnosis not present

## 2015-12-26 DIAGNOSIS — Y9389 Activity, other specified: Secondary | ICD-10-CM | POA: Diagnosis not present

## 2015-12-26 DIAGNOSIS — S79912A Unspecified injury of left hip, initial encounter: Secondary | ICD-10-CM | POA: Diagnosis present

## 2015-12-26 DIAGNOSIS — Z79899 Other long term (current) drug therapy: Secondary | ICD-10-CM | POA: Diagnosis not present

## 2015-12-26 DIAGNOSIS — S73005A Unspecified dislocation of left hip, initial encounter: Secondary | ICD-10-CM

## 2015-12-26 MED ORDER — FENTANYL CITRATE (PF) 100 MCG/2ML IJ SOLN
50.0000 ug | INTRAMUSCULAR | Status: DC | PRN
  Administered 2015-12-26: 50 ug via INTRAVENOUS
  Filled 2015-12-26: qty 2

## 2015-12-26 NOTE — ED Triage Notes (Signed)
Patient received Fentanyl 150 mcg for left hip pain per EMS

## 2015-12-26 NOTE — ED Triage Notes (Signed)
Patient arrives by EMS with complaints of left hip pain/dislocation.  Per EMS-patient was sitting and rolled from one buttock to the other and experienced severe pain and unable to stand left hip/left leg.

## 2015-12-26 NOTE — ED Notes (Signed)
Bed: [facility] Expected date:  Expected time:  Means of arrival:  Comments: EMS hip dislocation

## 2015-12-27 ENCOUNTER — Emergency Department (HOSPITAL_COMMUNITY): Payer: Medicare HMO

## 2015-12-27 MED ORDER — HYDROCODONE-ACETAMINOPHEN 5-325 MG PO TABS: 1.0000 | ORAL_TABLET | Freq: Two times a day (BID) | ORAL | 0 refills | Status: DC | PRN

## 2015-12-27 MED ORDER — SODIUM CHLORIDE 0.9 % IV BOLUS (SEPSIS)
1000.0000 mL | Freq: Once | INTRAVENOUS | Status: AC
  Administered 2015-12-27: 1000 mL via INTRAVENOUS

## 2015-12-27 MED ORDER — KETAMINE HCL 10 MG/ML IJ SOLN
INTRAMUSCULAR | Status: AC | PRN
  Administered 2015-12-27: 130 mg via INTRAVENOUS
  Administered 2015-12-27: 60 mg via INTRAVENOUS

## 2015-12-27 MED ORDER — KETAMINE HCL 10 MG/ML IJ SOLN
2.0000 mg/kg | Freq: Once | INTRAMUSCULAR | Status: AC
  Administered 2015-12-27: 190 mg via INTRAVENOUS
  Filled 2015-12-27 (×2): qty 2

## 2015-12-27 NOTE — ED Provider Notes (Signed)
Hip reduction by me. Dr. Claudine Mouton did conscious sedation and complete work up. Please see his note for further.   Reduction of dislocation Date/Time:  1:50 AM Performed by: Hanley Hays Authorized by: Hanley Hays Consent: Verbal consent obtained. Risks and benefits: risks, benefits and alternatives were discussed Consent given by: patient Required items: required blood products, implants, devices, and special equipment available Time out: Immediately prior to procedure a "time out" was called to verify the correct patient, procedure, equipment, support staff and site/side marked as required.  Patient sedated: By Dr. Claudine Mouton. Please see his sedation note.   Vitals: Vital signs were monitored during sedation. Patient tolerance: Patient tolerated the procedure well with no immediate complications. Joint: Left Hip Reduction technique: Traction/ countertraction      Phillip Pean, PA-C 12/27/15 [id number]    Everlene Balls, MD 12/27/15 [phone]

## 2015-12-27 NOTE — ED Provider Notes (Signed)
[place] DEPT Provider Note   CSN: [id number] Arrival date & time: 12/26/15  2211  By signing my name below, I, Emmanuella Mensah, attest that this documentation has been prepared under the direction and in the presence of Everlene Balls, MD. Electronically Signed: Judithann Sauger, ED Scribe. 12/27/15. 1:14 AM.   History   Chief Complaint Chief Complaint  Patient presents with  . Left hip pain    HPI Comments: Phillip Turner is a 55 y.o. male with a hx of DM, Leukocytosis, HTN, and CVA who presents to the Emergency Department complaining of ongoing moderate left hip pain s/p rolling from side to side that occurred at 9:30 pm yesterday He explains that he was sitting in the rocking chair when he felt sudden onset of pain when he rolled from one buttock to the other. No alleviating factors noted. Pt has not tried any medications PTA. He reports a hx of total left hip arthroplasty on November 14, 2015. He adds that he had a similar episode after the surgery while sitting in that same chair however he states that "it popped back in place" at that time. No fever, chills, numbness, generalized rash, or any open wounds.   The history is provided by the patient. No language interpreter was used.    Past Medical History:  Diagnosis Date  . Anxiety    from past drug use not longer having anxiety  . Arthritis   . Depression    past depression relating to drug use   . Diabetes mellitus   . Gout   . Hyperlipidemia   . Hypertension   . Hypothyroidism   . Rheumatic fever    as a teenager  . 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 Stroke [facility])    does not remember when  . Substance abuse    former cocaine user- not since March 2014    Patient Active Problem List   Diagnosis Date Noted  . Primary osteoarthritis of left hip 10/17/2015  . CVA (cerebral infarction) 09/08/2013  . HTN (hypertension) 03/12/2012  . Major depression, recurrent ([place]) 03/10/2012  . Anxiety disorder  03/10/2012  . Leukocytosis 03/08/2012  . Hyponatremia 03/08/2012  . DM (diabetes mellitus), type 2, uncontrolled with complications ([place]) 99991111  . Hypothyroidism 03/29/2011  . Depression 03/27/2011  . H/O 03/27/2011  . Memory impairment 03/27/2011  . Alcohol dependence ([place]) 03/26/2011  . 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 Cocaine abuse 03/26/2011    Past Surgical History:  Procedure Laterality Date  . boil removed from tailbone     . COLONOSCOPY    . TONSILLECTOMY    . TOTAL HIP ARTHROPLASTY Left 11/14/2015   Procedure: LEFT TOTAL HIP ARTHROPLASTY ANTERIOR APPROACH;  Surgeon: Renette Butters, MD;  Location: [place];  Service: Orthopedics;  Laterality: Left;       Home Medications    Prior to Admission medications   Medication Sig Start Date End Date Taking? Authorizing Provider  amLODipine (NORVASC) 10 MG tablet TAKE 1 TABLET BY MOUTH EVERY DAY FOR BLOOD PRESSURE 06/06/14   Evie Lacks Plotnikov, MD  colchicine 0.6 MG tablet TAKE 1 TABLET BY MOUTH TWICE DAILY FOR GOUT FLARE-UPS 09/12/14   Cassandria Anger, MD  diclofenac sodium (VOLTAREN) 1 % GEL Apply 1 application topically 2 (two) times daily as needed.  11/21/14   Historical Provider, MD  docusate sodium (COLACE) 100 MG capsule Take 1 capsule (100 mg total) by mouth 2 (two) times daily. 11/14/15  Renette Butters, MD  EDARBYCLOR 40-25 MG TABS Take 1 tablet by mouth daily. 09/04/15   Historical Provider, MD  empagliflozin (JARDIANCE) 10 MG TABS tablet Take 10 mg by mouth daily.    Historical Provider, MD  furosemide (LASIX) 40 MG tablet Take 1 tablet (40 mg total) by mouth daily as needed for edema. Patient taking differently: Take 40 mg by mouth daily as needed for fluid or edema.  09/08/13   Evie Lacks Plotnikov, MD  glipiZIDE (GLUCOTROL) 5 MG tablet Take 5 mg by mouth 2 (two) times daily before a meal.     Historical Provider, MD  HYDROcodone-acetaminophen (NORCO/VICODIN) 5-325 MG tablet Take 1-2 tablets by mouth  every 6 (six) hours as needed. 09/14/15   Montine Circle, PA-C  levothyroxine (SYNTHROID, LEVOTHROID) 100 MCG tablet Take 112 mcg by mouth daily.  08/04/15   Historical Provider, MD  LINZESS 145 MCG CAPS capsule Take 1 tablet by mouth daily. 08/04/15   Historical Provider, MD  LYRICA 75 MG capsule Take 75 mg by mouth daily.  12/27/14   Historical Provider, MD  methocarbamol (ROBAXIN) 500 MG tablet Take 1 tablet (500 mg total) by mouth every 6 (six) hours as needed for muscle spasms. 11/14/15   Renette Butters, MD  metoprolol succinate (TOPROL-XL) 25 MG 24 hr tablet Take 25 mg by mouth daily.    Historical Provider, MD  ondansetron (ZOFRAN) 4 MG tablet Take 1 tablet (4 mg total) by mouth every 8 (eight) hours as needed for nausea or vomiting. 11/14/15   Renette Butters, MD  oxyCODONE-acetaminophen (ROXICET) 5-325 MG tablet Take 1-2 tablets by mouth every 4 (four) hours as needed for severe pain. 11/14/15   Renette Butters, MD  potassium chloride SA (K-DUR,KLOR-CON) 20 MEQ tablet Take 2 tablets (40 mEq total) by mouth 2 (two) times daily. Patient taking differently: Take 20 mEq by mouth 3 (three) times daily. Takes 1 tablet daily, can take another tablet if needed for fluid/edema 07/13/14   Cassandria Anger, MD  rivaroxaban (XARELTO) 10 MG TABS tablet Take 1 tablet (10 mg total) by mouth daily. 11/14/15   Renette Butters, MD  sildenafil (VIAGRA) 100 MG tablet Take 100 mg by mouth as needed for erectile dysfunction.    Historical Provider, MD  simvastatin (ZOCOR) 10 MG tablet Take 1 tablet by mouth daily. 11/04/14   Historical Provider, MD  traZODone (DESYREL) 100 MG tablet TAKE 2 TABLETS BY MOUTH EVERY NIGHT AT BEDTIME FOR SLEEP/DEPRESSION 07/07/14   Cassandria Anger, MD    Family History Family History  Problem Relation Age of Onset  . Coronary artery disease Mother   . Diabetes type II Father   . Hypertension Father   . Colon cancer Neg Hx   . Esophageal cancer Neg Hx   . Rectal cancer Neg Hx     . Stomach cancer Neg Hx     Social History Social History  Substance Use Topics  . Smoking status: Former Smoker    Years: 5.00    Types: Cigarettes  . Smokeless tobacco: Never Used  . Alcohol use No     Allergies   Clonidine derivatives; Shellfish allergy; Metformin and related; Naproxen; and Other   Review of Systems Review of Systems  Constitutional: Negative for chills and fever.  Musculoskeletal: Positive for arthralgias.  Skin: Negative for rash and wound.  Neurological: Negative for numbness.  All other systems reviewed and are negative.    Physical Exam Updated Vital Signs BP  146/75 (BP Location: Right Arm)   Pulse 66   Temp 97.8 F (36.6 C) (Oral)   Resp 16   Wt 291 lb 14.2 oz (132.4 kg)   SpO2 98%   BMI 41.88 kg/m   Physical Exam  Constitutional: He is oriented to person, place, and time. Vital signs are normal. He appears well-developed and well-nourished.  Non-toxic appearance. He does not appear ill. No distress.  HENT:  Head: Normocephalic and atraumatic.  Nose: Nose normal.  Mouth/Throat: Oropharynx is clear and moist. No oropharyngeal exudate.  Eyes: Conjunctivae and EOM are normal. Pupils are equal, round, and reactive to light. No scleral icterus.  Neck: Normal range of motion. Neck supple. No tracheal deviation, no edema, no erythema and normal range of motion present. No thyroid mass and no thyromegaly present.  Cardiovascular: Normal rate, regular rhythm, S1 normal, S2 normal, normal heart sounds, intact distal pulses and normal pulses.  Exam reveals no gallop and no friction rub.   No murmur heard. Pulmonary/Chest: Effort normal and breath sounds normal. No respiratory distress. He has no wheezes. He has no rhonchi. He has no rales.  Abdominal: Soft. Normal appearance and bowel sounds are normal. He exhibits no distension, no ascites and no mass. There is no hepatosplenomegaly. There is no tenderness. There is no rebound, no guarding and no  CVA tenderness.  Musculoskeletal: Normal range of motion. He exhibits no edema or tenderness.  Obvious deformity of left hip, shortening and externally rotation Normal pulses and sensation distally  Lymphadenopathy:    He has no cervical adenopathy.  Neurological: He is alert and oriented to person, place, and time. He has normal strength. No cranial nerve deficit or sensory deficit.  Skin: Skin is warm, dry and intact. No petechiae and no rash noted. He is not diaphoretic. No erythema. No pallor.  Nursing note and vitals reviewed.    ED Treatments / Results  DIAGNOSTIC STUDIES: Oxygen Saturation is 90% on RA, low by my interpretation.    COORDINATION OF CARE: 1:00 AM- Pt advised of plan for treatment and pt agrees. Pt informed of his x-ray results. He will receive IV fluids and Ketalar.    Labs (all labs ordered are listed, but only abnormal results are displayed) Labs Reviewed - No data to display  EKG  EKG Interpretation None       Radiology Dg Hip Port Unilat W Or Wo Pelvis 1 View Left  Result Date: 12/27/2015 CLINICAL DATA:  55 y/o M; postreduction of left hip prosthesis dislocation. EXAM: DG HIP (WITH OR WITHOUT PELVIS) 1V PORT LEFT COMPARISON:  Left hip radiographs 12/26/2015 FINDINGS: Total left hip prosthesis. Interval reduction of dislocation with the femoral head component well seated in the acetabular component. IMPRESSION: Interval reduction of dislocated total left hip prosthesis. Electronically Signed   By: Kristine Garbe M.D.   On: 12/27/2015 02:23   Dg Hip Unilat W Or Wo Pelvis 2-3 Views Left  Result Date: 12/26/2015 CLINICAL DATA:  55 y/o  M; complaint of left hip pain/dislocation. EXAM: DG HIP (WITH OR WITHOUT PELVIS) 2-3V LEFT COMPARISON:  Intraoperative radiographs dated 11/14/2015. FINDINGS: Total left hip prosthesis. There is anterior and superior dislocation of the femoral head component relative to the acetabular component. Hardware appears  intact. No osseous fracture is identified. Severe degenerative changes of the right hip joint. IMPRESSION: Anterior superior dislocation of total left hip prosthesis. Hardware appears intact. No fracture is identified. Electronically Signed   By: Kristine Garbe M.D.   On:  12/26/2015 22:48    Procedures Procedures (including critical care time)  Medications Ordered in ED Medications  fentaNYL (SUBLIMAZE) injection 50 mcg (50 mcg Intravenous Given 12/26/15 2343)  ketamine (KETALAR) injection 265 mg (190 mg Intravenous Given 12/27/15 0134)  sodium chloride 0.9 % bolus 1,000 mL (1,000 mLs Intravenous New Bag/Given 12/27/15 0130)  ketamine (KETALAR) injection (60 mg Intravenous Given 12/27/15 0138)     Initial Impression / Assessment and Plan / ED Course  Everlene Balls, MD has reviewed the triage vital signs and the nursing notes.  Pertinent labs & imaging results that were available during my care of the patient were reviewed by me and considered in my medical decision making (see chart for details).  Clinical Course    Patient presents to the ED for L hip pain after sitting his his rocking chair.  Xray reveals a dislocation.  Will attempt to sedate and reduce.    Patient placed in a knee immobilizer and advised to fu with Dr. Percell Miller this week. He demonstrates good understanding. He appears well and in NAD. VS remain within his normal limits and he is safe for DC.   Procedural sedation Performed by: Everlene Balls Consent: Verbal consent obtained. Risks and benefits: risks, benefits and alternatives were discussed Required items: required blood products, implants, devices, and special equipment available Patient identity confirmed: arm band and provided demographic data Time out: Immediately prior to procedure a "time out" was called to verify the correct patient, procedure, equipment, support staff and site/side marked as required.  Sedation type: moderate (conscious) sedation NPO  time confirmed and considedered  Sedatives: [place]   Physician Time at Bedside: 59min  Vitals: Vital signs were monitored during sedation. Cardiac Monitor, pulse oximeter Patient tolerance: Patient tolerated the procedure well with no immediate complications. Comments: Pt with uneventful recovered. Returned to pre-procedural sedation baseline    Reduction of dislocation Date/Time: 2:40 AM Performed by: Everlene Balls Authorized byEverlene Balls Consent: Verbal consent obtained. Risks and benefits: risks, benefits and alternatives were discussed Consent given by: patient Required items: required blood products, implants, devices, and special equipment available Time out: Immediately prior to procedure a "time out" was called to verify the correct patient, procedure, equipment, support staff and site/side marked as required.  Patient sedated: Ketamine  Vitals: Vital signs were monitored during sedation. Patient tolerance: Patient tolerated the procedure well with no immediate complications. Joint: L hip Reduction technique: traction-countertraction    Final Clinical Impressions(s) / ED Diagnoses   Final diagnoses:  Hip dislocation, left, initial encounter [facility])    New Prescriptions New Prescriptions   No medications on file   I personally performed the services described in this documentation, which was scribed in my presence. The recorded information has been reviewed and is accurate.      Everlene Balls, MD 12/27/15 [phone]

## 2016-03-04 DIAGNOSIS — M1611 Unilateral primary osteoarthritis, right hip: Secondary | ICD-10-CM | POA: Diagnosis present

## 2016-03-04 NOTE — H&P (Signed)
PREOPERATIVE H&P Patient ID: Phillip Turner MRN: [id number] DOB/AGE: 06-22-1960 55 y.o.  Chief Complaint: OA RIGHT HIP  Planned Procedure Date: 03/26/16 Medical and Cardiac Clearance by Dr. Jeanie Cooks.     HPI: Phillip Turner is a 55 y.o. male Jehovah's Witness with a history of substance abuse in remission, DM, HTN, and remote hx of CVA who presents for evaluation of OA RIGHT HIP. The patient has a history of pain and functional disability in the right hip due to arthritis and has failed non-surgical conservative treatments for greater than 12 weeks to include NSAID's and/or analgesics, corticosteriod injections, use of assistive devices and activity modification.  Onset of symptoms was gradual, starting 1 years ago with gradually worsening course since that time.  Patient currently rates pain at 9 out of 10 with activity. Patient has night pain, worsening of pain with activity and weight bearing, pain that interferes with activities of daily living and pain with passive range of motion.  Patient has evidence of subchondral cysts, subchondral sclerosis, periarticular osteophytes and joint space narrowing by imaging studies. There is no active infection.  Past Medical History:  Diagnosis Date  . Anxiety    from past drug use not longer having anxiety  . Arthritis   . Depression    past depression relating to drug use   . Diabetes mellitus   . Gout   . Hyperlipidemia   . Hypertension   . Hypothyroidism   . Rheumatic fever    as a teenager  . 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 Stroke [facility])    does not remember when  . Substance abuse    former cocaine user- not since March 2014   Past Surgical History:  Procedure Laterality Date  . boil removed from tailbone     . COLONOSCOPY    . TONSILLECTOMY    . TOTAL HIP ARTHROPLASTY Left 11/14/2015   Procedure: LEFT TOTAL HIP ARTHROPLASTY ANTERIOR APPROACH;  Surgeon: Renette Butters, MD;  Location: [place];  Service:  Orthopedics;  Laterality: Left;   Allergies  Allergen Reactions  . Clonidine Derivatives Other (See Comments)    Slow heartbeat.  . Shellfish Allergy Anaphylaxis and Nausea And Vomiting  . Metformin And Related Nausea And Vomiting    Patient states it makes him sick  . Naproxen Swelling  . Other Itching and Rash    Stainless steele.     Not pure metals   Indomethicin: swelling - NO SOB, mouth or tongue swelling.  Prior to Admission medications   Medication Sig Start Date End Date Taking? Authorizing Provider  amLODipine (NORVASC) 10 MG tablet TAKE 1 TABLET BY MOUTH EVERY DAY FOR BLOOD PRESSURE 06/06/14   Evie Lacks Plotnikov, MD  colchicine 0.6 MG tablet TAKE 1 TABLET BY MOUTH TWICE DAILY FOR GOUT FLARE-UPS 09/12/14   Cassandria Anger, MD  diclofenac sodium (VOLTAREN) 1 % GEL Apply 1 application topically 2 (two) times daily as needed.  11/21/14   Historical Provider, MD  docusate sodium (COLACE) 100 MG capsule Take 1 capsule (100 mg total) by mouth 2 (two) times daily. 11/14/15   Renette Butters, MD  EDARBYCLOR 40-25 MG TABS Take 1 tablet by mouth daily. 09/04/15   Historical Provider, MD  empagliflozin (JARDIANCE) 10 MG TABS tablet Take 10 mg by mouth daily.    Historical Provider, MD  furosemide (LASIX) 40 MG tablet Take 1 tablet (40 mg total) by mouth daily as needed for edema. Patient  taking differently: Take 40 mg by mouth daily as needed for fluid or edema.  09/08/13   Evie Lacks Plotnikov, MD  glipiZIDE (GLUCOTROL) 5 MG tablet Take 5 mg by mouth 2 (two) times daily before a meal.     Historical Provider, MD  HYDROcodone-acetaminophen (NORCO/VICODIN) 5-325 MG tablet Take 1 tablet by mouth 2 (two) times daily as needed for severe pain. 12/27/15   Everlene Balls, MD  levothyroxine (SYNTHROID, LEVOTHROID) 100 MCG tablet Take 112 mcg by mouth daily.  08/04/15   Historical Provider, MD  LINZESS 145 MCG CAPS capsule Take 1 tablet by mouth daily. 08/04/15   Historical Provider, MD  LYRICA 75 MG capsule  Take 75 mg by mouth daily.  12/27/14   Historical Provider, MD  methocarbamol (ROBAXIN) 500 MG tablet Take 1 tablet (500 mg total) by mouth every 6 (six) hours as needed for muscle spasms. 11/14/15   Renette Butters, MD  metoprolol succinate (TOPROL-XL) 25 MG 24 hr tablet Take 25 mg by mouth daily.    Historical Provider, MD  ondansetron (ZOFRAN) 4 MG tablet Take 1 tablet (4 mg total) by mouth every 8 (eight) hours as needed for nausea or vomiting. 11/14/15   Renette Butters, MD  oxyCODONE-acetaminophen (ROXICET) 5-325 MG tablet Take 1-2 tablets by mouth every 4 (four) hours as needed for severe pain. 11/14/15   Renette Butters, MD  potassium chloride SA (K-DUR,KLOR-CON) 20 MEQ tablet Take 2 tablets (40 mEq total) by mouth 2 (two) times daily. Patient taking differently: Take 20 mEq by mouth 3 (three) times daily. Takes 1 tablet daily, can take another tablet if needed for fluid/edema 07/13/14   Cassandria Anger, MD  rivaroxaban (XARELTO) 10 MG TABS tablet Take 1 tablet (10 mg total) by mouth daily. 11/14/15   Renette Butters, MD  sildenafil (VIAGRA) 100 MG tablet Take 100 mg by mouth as needed for erectile dysfunction.    Historical Provider, MD  simvastatin (ZOCOR) 10 MG tablet Take 1 tablet by mouth daily. 11/04/14   Historical Provider, MD  traZODone (DESYREL) 100 MG tablet TAKE 2 TABLETS BY MOUTH EVERY NIGHT AT BEDTIME FOR SLEEP/DEPRESSION 07/07/14   Cassandria Anger, MD   Social History   Social History  . Marital status: Married    Spouse name: N/A  . Number of children: N/A  . Years of education: N/A   Social History Main Topics  . Smoking status: Former Smoker    Years: 5.00    Types: Cigarettes  . Smokeless tobacco: Never Used  . Alcohol use No  . Drug use: No     Comment: Cocaine, crack former- since 2014  . Sexual activity: Yes   Other Topics Concern  . Not on file   Social History Narrative   Married   11th grade education   Not employed    Family History  Problem  Relation Age of Onset  . Coronary artery disease Mother   . Diabetes type II Father   . Hypertension Father   . Colon cancer Neg Hx   . Esophageal cancer Neg Hx   . Rectal cancer Neg Hx   . Stomach cancer Neg Hx     ROS: Currently denies lightheadedness, dizziness, Fever, chills, CP, SOB.   No personal history of DVT, PE, MI.  CVA 2005 on ASA 325 daily. No loose teeth or dentures. All other systems have been reviewed and were otherwise currently negative with the exception of those mentioned in the HPI  and as above.  Objective: Vitals: Ht: 5'9" Wt: 292 Temp: 98 BP: 150/80 Pulse: 69 O2 94% on room air. Physical Exam: General: Alert, NAD.  Trendelenberg Gait.  Walks with a cane HEENT: EOMI, Good Neck Extension  Pulm: No increased work of breathing.  Clear B/L A/P w/o crackle or wheeze. CV: RRR, No m/g/r appreciated  GI: soft, NT, ND Neuro: Neuro grossly intact b/l upper/lower ext.  Sensation intact distally Skin: No lesions in the area of chief complaint MSK/Surgical Site: Left Hip mildly tender over greater trochanter.  Pain with passive ROM.  Positive Stinchfield.  5/5 strength.  NVI.  Sensation intact distally.   Imaging Review Plain radiographs demonstrate severe degenerative joint disease of the right hip.  H/O THA on the left.  Assessment: OA RIGHT HIP Principal Problem:   Primary osteoarthritis of right hip Active Problems:   Depression   Memory impairment   DM (diabetes mellitus), type 2, uncontrolled with complications (HCC)   Hypothyroidism   Anxiety disorder   Plan: Plan for Procedure(s): TOTAL HIP ARTHROPLASTY ANTERIOR APPROACH  The patient history, physical exam, clinical judgement of the provider and imaging are consistent with end stage degenerative joint disease and total joint arthroplasty is deemed medically necessary. The treatment options including medical management, injection therapy, and arthroplasty were discussed at length. The risks and benefits  of Procedure(s): TOTAL HIP ARTHROPLASTY ANTERIOR APPROACH were presented and reviewed.  The risks of nonoperative treatment, versus surgical intervention including but not limited to continued pain, aseptic loosening, stiffness, dislocation/subluxation, infection, bleeding, nerve injury, blood clots, cardiopulmonary complications, morbidity, mortality, among others were discussed. The patient verbalizes understanding and wishes to proceed with the plan.  Patient is being admitted for inpatient treatment for surgery, pain control, PT, OT, prophylactic antibiotics, VTE prophylaxis, progressive ambulation, ADL's and discharge planning.   Dental prophylaxis discussed and recommended for 2 years postoperatively.  Jehovah's Witness - No Blood or Blood Products. The patient does meet the criteria for TXA which will be used perioperatively via IV.   He currently takes Norco 5/325, 1-2 up to TID for hip pain. ASA 325 mg will be used postoperatively for DVT prophylaxis in addition to SCDs, and early ambulation. The patient is planning to be discharged home with home health services in care of his 2 daughters and family / friends.  Prudencio Burly III, PA-C 03/04/2016 8:05 AM

## 2016-03-05 ENCOUNTER — Encounter (HOSPITAL_COMMUNITY): Payer: Self-pay | Admitting: Emergency Medicine

## 2016-03-05 ENCOUNTER — Emergency Department (HOSPITAL_COMMUNITY)
Admission: EM | Admit: 2016-03-05 | Discharge: 2016-03-05 | Disposition: A | Discharge status: Home / Self Care | Payer: Medicare HMO | Attending: Emergency Medicine | Admitting: Emergency Medicine

## 2016-03-05 DIAGNOSIS — E039 Hypothyroidism, unspecified: Secondary | ICD-10-CM | POA: Diagnosis not present

## 2016-03-05 DIAGNOSIS — Z791 Long term (current) use of non-steroidal anti-inflammatories (NSAID): Secondary | ICD-10-CM | POA: Diagnosis not present

## 2016-03-05 DIAGNOSIS — I10 Essential (primary) hypertension: Secondary | ICD-10-CM | POA: Diagnosis not present

## 2016-03-05 DIAGNOSIS — E119 Type 2 diabetes mellitus without complications: Secondary | ICD-10-CM | POA: Insufficient documentation

## 2016-03-05 DIAGNOSIS — Z79899 Other long term (current) drug therapy: Secondary | ICD-10-CM | POA: Insufficient documentation

## 2016-03-05 DIAGNOSIS — Z7984 Long term (current) use of oral hypoglycemic drugs: Secondary | ICD-10-CM | POA: Insufficient documentation

## 2016-03-05 DIAGNOSIS — K0889 Other specified disorders of teeth and supporting structures: Secondary | ICD-10-CM | POA: Diagnosis not present

## 2016-03-05 DIAGNOSIS — Z87891 Personal history of nicotine dependence: Secondary | ICD-10-CM | POA: Insufficient documentation

## 2016-03-05 MED ORDER — PENICILLIN V POTASSIUM 500 MG PO TABS
ORAL_TABLET | ORAL | Status: AC
  Administered 2016-03-05: 500 mg
  Filled 2016-03-05: qty 1

## 2016-03-05 MED ORDER — PENICILLIN V POTASSIUM 500 MG PO TABS: 500.0000 mg | ORAL_TABLET | Freq: Four times a day (QID) | ORAL | 0 refills | Status: DC

## 2016-03-05 MED ORDER — PENICILLIN V POTASSIUM 500 MG PO TABS
500.0000 mg | ORAL_TABLET | Freq: Once | ORAL | Status: DC
  Filled 2016-03-05: qty 1

## 2016-03-05 MED ORDER — TRAMADOL HCL 50 MG PO TABS: 50.0000 mg | ORAL_TABLET | Freq: Every day | ORAL | 0 refills | Status: DC

## 2016-03-05 MED ORDER — TRAMADOL HCL 50 MG PO TABS
ORAL_TABLET | ORAL | Status: AC
  Administered 2016-03-05: 50 mg
  Filled 2016-03-05: qty 1

## 2016-03-05 MED ORDER — TRAMADOL HCL 50 MG PO TABS: 50.0000 mg | ORAL_TABLET | Freq: Four times a day (QID) | ORAL | 0 refills | Status: DC | PRN

## 2016-03-05 MED ORDER — PENICILLIN V POTASSIUM 500 MG PO TABS: 500.0000 mg | ORAL_TABLET | Freq: Four times a day (QID) | ORAL | 0 refills | Status: AC

## 2016-03-05 MED ORDER — TRAMADOL HCL 50 MG PO TABS
50.0000 mg | ORAL_TABLET | Freq: Once | ORAL | Status: DC
  Filled 2016-03-05: qty 1

## 2016-03-05 NOTE — ED Triage Notes (Signed)
Pt c/o upper back right sided dental pain for past 3 days. Pt states he wanted to get it checked cause he couldn't have another sleepless night and thinks he needs antibiotics. Pt states he thinks he irritated the gum. Denies fever or trouble swallowing.

## 2016-03-05 NOTE — Discharge Instructions (Signed)
Read the information below.  You are being started on antibiotics. Please take as prescribed. You can take tylenol/motrin for mild to moderate pain. I have prescribed tramadol to be taken once daily if pain is severe. You can apply ice for relief.  It is very important that you follow up with a dentist. I have provided the contact information for a dentist. Please call in the morning.  Use the prescribed medication as directed.  Please discuss all new medications with your pharmacist.   You may return to the Emergency Department at any time for worsening condition or any new symptoms that concern you. Return if you develop trouble swallowing, trouble breathing, or can't open your mouth.

## 2016-03-05 NOTE — ED Provider Notes (Signed)
[facility] DEPT Provider Note   CSN: [id number] Arrival date & time: 03/05/16  1744  By signing my name below, I, Higinio Plan, attest that this documentation has been prepared under the direction and in the presence of non-physician practitioner, Gay Filler, PA-C. Electronically Signed: Higinio Plan, Scribe. 03/05/2016. 6:49 PM.  History   Chief Complaint Chief Complaint  Patient presents with  . Dental Pain   The history is provided by the patient. No language interpreter was used.   HPI Comments: Phillip Turner is a 55 y.o. male with PMHx of HTN, HLD and DM, who presents to the Emergency Department complaining of gradually worsening, "throbbing," right upper dental and gum pain that began 3 days ago. Pt reports his pain is exacerbated when eating and biting. He states associated difficulty sleeping due to pain and gum swelling. He notes he has tried taking "aloe oil" with temporary relief for ~15 minutes. Pt reports hx of similar symptoms 6 months ago; however, it subsided on its own. He states he has a Pharmacist, community but has been unable to schedule an appointment due to lack of funds; he notes he does not have dental insurance. He denies fever, difficulty swallowing, shortness of breath, joint pain, myalgias, vomiting, foul tasting discharge, trauma to his mouth, or neck pain.      Past Medical History:  Diagnosis Date  . Anxiety    from past drug use not longer having anxiety  . Arthritis   . Depression    past depression relating to drug use   . Diabetes mellitus   . Gout   . Hyperlipidemia   . Hypertension   . Hypothyroidism   . Rheumatic fever    as a teenager  . 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 Stroke [facility])    does not remember when  . Substance abuse    former cocaine user- not since March 2014    Patient Active Problem List   Diagnosis Date Noted  . Primary osteoarthritis of right hip 03/04/2016  . Primary osteoarthritis of left hip 10/17/2015  .  CVA (cerebral infarction) 09/08/2013  . HTN (hypertension) 03/12/2012  . Major depression, recurrent ([place]) 03/10/2012  . Anxiety disorder 03/10/2012  . Leukocytosis 03/08/2012  . Hyponatremia 03/08/2012  . DM (diabetes mellitus), type 2, uncontrolled with complications ([place]) 99991111  . Hypothyroidism 03/29/2011  . Depression 03/27/2011  . H/O 03/27/2011  . Memory impairment 03/27/2011  . Alcohol dependence ([place]) 03/26/2011  . 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 Cocaine abuse 03/26/2011    Past Surgical History:  Procedure Laterality Date  . boil removed from tailbone     . COLONOSCOPY    . TONSILLECTOMY    . TOTAL HIP ARTHROPLASTY Left 11/14/2015   Procedure: LEFT TOTAL HIP ARTHROPLASTY ANTERIOR APPROACH;  Surgeon: Renette Butters, MD;  Location: [facility];  Service: Orthopedics;  Laterality: Left;    Home Medications    Prior to Admission medications   Medication Sig Start Date End Date Taking? Authorizing Provider  amLODipine (NORVASC) 10 MG tablet TAKE 1 TABLET BY MOUTH EVERY DAY FOR BLOOD PRESSURE 06/06/14   Evie Lacks Plotnikov, MD  colchicine 0.6 MG tablet TAKE 1 TABLET BY MOUTH TWICE DAILY FOR GOUT FLARE-UPS 09/12/14   Cassandria Anger, MD  diclofenac sodium (VOLTAREN) 1 % GEL Apply 1 application topically 2 (two) times daily as needed.  11/21/14   Historical Provider, MD  docusate sodium (COLACE) 100 MG capsule Take 1 capsule (  100 mg total) by mouth 2 (two) times daily. 11/14/15   Renette Butters, MD  EDARBYCLOR 40-25 MG TABS Take 1 tablet by mouth daily. 09/04/15   Historical Provider, MD  empagliflozin (JARDIANCE) 10 MG TABS tablet Take 10 mg by mouth daily.    Historical Provider, MD  furosemide (LASIX) 40 MG tablet Take 1 tablet (40 mg total) by mouth daily as needed for edema. Patient taking differently: Take 40 mg by mouth daily as needed for fluid or edema.  09/08/13   Evie Lacks Plotnikov, MD  glipiZIDE (GLUCOTROL) 5 MG tablet Take 5 mg by mouth 2 (two) times  daily before a meal.     Historical Provider, MD  HYDROcodone-acetaminophen (NORCO/VICODIN) 5-325 MG tablet Take 1 tablet by mouth 2 (two) times daily as needed for severe pain. 12/27/15   Everlene Balls, MD  levothyroxine (SYNTHROID, LEVOTHROID) 100 MCG tablet Take 112 mcg by mouth daily.  08/04/15   Historical Provider, MD  LINZESS 145 MCG CAPS capsule Take 1 tablet by mouth daily. 08/04/15   Historical Provider, MD  LYRICA 75 MG capsule Take 75 mg by mouth daily.  12/27/14   Historical Provider, MD  methocarbamol (ROBAXIN) 500 MG tablet Take 1 tablet (500 mg total) by mouth every 6 (six) hours as needed for muscle spasms. 11/14/15   Renette Butters, MD  metoprolol succinate (TOPROL-XL) 25 MG 24 hr tablet Take 25 mg by mouth daily.    Historical Provider, MD  ondansetron (ZOFRAN) 4 MG tablet Take 1 tablet (4 mg total) by mouth every 8 (eight) hours as needed for nausea or vomiting. 11/14/15   Renette Butters, MD  oxyCODONE-acetaminophen (ROXICET) 5-325 MG tablet Take 1-2 tablets by mouth every 4 (four) hours as needed for severe pain. 11/14/15   Renette Butters, MD  penicillin v potassium (VEETID) 500 MG tablet Take 1 tablet (500 mg total) by mouth 4 (four) times daily. 03/05/16 03/12/16  Roxanna Mew, PA-C  potassium chloride SA (K-DUR,KLOR-CON) 20 MEQ tablet Take 2 tablets (40 mEq total) by mouth 2 (two) times daily. Patient taking differently: Take 20 mEq by mouth 3 (three) times daily. Takes 1 tablet daily, can take another tablet if needed for fluid/edema 07/13/14   Cassandria Anger, MD  rivaroxaban (XARELTO) 10 MG TABS tablet Take 1 tablet (10 mg total) by mouth daily. 11/14/15   Renette Butters, MD  sildenafil (VIAGRA) 100 MG tablet Take 100 mg by mouth as needed for erectile dysfunction.    Historical Provider, MD  simvastatin (ZOCOR) 10 MG tablet Take 1 tablet by mouth daily. 11/04/14   Historical Provider, MD  traMADol (ULTRAM) 50 MG tablet Take 1 tablet (50 mg total) by mouth daily.  03/05/16   Roxanna Mew, PA-C  traZODone (DESYREL) 100 MG tablet TAKE 2 TABLETS BY MOUTH EVERY NIGHT AT BEDTIME FOR SLEEP/DEPRESSION 07/07/14   Cassandria Anger, MD    Family History Family History  Problem Relation Age of Onset  . Coronary artery disease Mother   . Diabetes type II Father   . Hypertension Father   . Colon cancer Neg Hx   . Esophageal cancer Neg Hx   . Rectal cancer Neg Hx   . Stomach cancer Neg Hx     Social History Social History  Substance Use Topics  . Smoking status: Former Smoker    Years: 5.00    Types: Cigarettes  . Smokeless tobacco: Never Used  . Alcohol use No  Allergies   Clonidine derivatives; Shellfish allergy; Metformin and related; Naproxen; Indomethacin; and Other   Review of Systems Review of Systems  Constitutional: Negative for fever.  HENT: Positive for dental problem. Negative for trouble swallowing.   Respiratory: Negative for shortness of breath.   Gastrointestinal: Negative for vomiting.  Musculoskeletal: Negative for arthralgias, myalgias and neck pain.  Psychiatric/Behavioral: Positive for sleep disturbance.   Physical Exam Updated Vital Signs There were no vitals taken for this visit.  Physical Exam  Constitutional: He appears well-developed and well-nourished. No distress.  HENT:  Head: Normocephalic and atraumatic.  Mouth/Throat: Uvula is midline, oropharynx is clear and moist and mucous membranes are normal. No trismus in the jaw. Abnormal dentition. No tonsillar exudate.  Gingival swelling, TTP to tooth #2, partial edentulous. No trismus. Uvula midline. Managing oral secretions.   Eyes: Conjunctivae are normal. No scleral icterus.  Neck: Normal range of motion. No neck rigidity. Normal range of motion present.  No nuchal rigidity.   Pulmonary/Chest: Effort normal. No respiratory distress.  Lymphadenopathy:    He has no cervical adenopathy.  Neurological: He is alert.  Skin: Skin is warm and dry. He is  not diaphoretic.  Psychiatric: He has a normal mood and affect. His behavior is normal.   ED Treatments / Results  Labs (all labs ordered are listed, but only abnormal results are displayed) Labs Reviewed - No data to display  EKG  EKG Interpretation None       Radiology No results found.  Procedures Procedures (including critical care time)  Medications Ordered in ED Medications  traMADol (ULTRAM) 50 MG tablet (50 mg  Given 03/05/16 1900)  penicillin v potassium (VEETID) 500 MG tablet (500 mg  Given 03/05/16 1900)    DIAGNOSTIC STUDIES:   COORDINATION OF CARE:  6:46 PM Discussed treatment plan with pt at bedside and pt agreed to plan.  Initial Impression / Assessment and Plan / ED Course  I have reviewed the triage vital signs and the nursing notes.  Pertinent labs & imaging results that were available during my care of the patient were reviewed by me and considered in my medical decision making (see chart for details).  Clinical Course    Patient presents to ED with complaint of dental pain. Patient is afebrile and non-toxic appearing in NAD.  Patient with toothache.  No gross abscess.  Exam unconcerning for Ludwig's angina or spread of infection. No trismus. Uvula midline. Managing oral secretions. No nuchal rigidity. Will treat with penicillin and tramadol. Review of [place] control substance database reveals 10 day supply of Norco dispensed on 02/12/2016. Referral to dentist provided. Return precautions given. Patient voiced understanding and is agreeable.     I personally performed the services described in this documentation, which was scribed in my presence. The recorded information has been reviewed and is accurate.   Final Clinical Impressions(s) / ED Diagnoses   Final diagnoses:  Pain, dental    New Prescriptions Discharge Medication List as of 03/05/2016  6:54 PM       Roxanna Mew, PA-C 03/05/16 [place], DO 03/05/16 2351

## 2016-03-13 NOTE — Pre-Procedure Instructions (Addendum)
Reuben R Mellis  03/13/2016      [id number] - [place], [place] - [address] [place] [address] Phone: [phone] Fax: [phone]  [facility] [place], [place] AT [address] [place] [address] Phone: [phone] Fax: [phone]    Your procedure is scheduled on Tuesday November 21.  Report to [facility] Admitting at 5;30 A.M.  Call this number if you have problems the morning of surgery:  [phone]   Remember:  Do not eat food or drink liquids after midnight.  Take these medicines the morning of surgery with A SIP OF WATER: amlodipine (Norvasc), levothyrozine (synthroid), metoprolol (Toprol-XL), hydrocodone (Vicodin) if needed  7 days prior to surgery STOP taking any Aspirin, Aleve, Naproxen, Ibuprofen, Motrin, Advil, Goody's, BC's, all herbal medications, fish oil, and all vitamins   WHAT DO I DO ABOUT MY DIABETES MEDICATION?   Marland Kitchen Do not take oral diabetes medicines (pills) the morning of surgery. DO NOT TAKE Jardiance or glipizide (Glucotrol) the day of surgery   *ONLY take morning and/or lunch dose of Glipizide (Glucotrol) the day before surgery    How to Manage Your Diabetes Before and After Surgery  Why is it important to control my blood sugar before and after surgery? . Improving blood sugar levels before and after surgery helps healing and can limit problems. . A way of improving blood sugar control is eating a healthy diet by: o  Eating less sugar and carbohydrates o  Increasing activity/exercise o  Talking with your doctor about reaching your blood sugar goals . High blood sugars (greater than 180 mg/dL) can raise your risk of infections and slow your recovery, so you will need to focus on controlling your diabetes during the weeks before surgery. . Make sure that the doctor who takes care of your diabetes knows about  your planned surgery including the date and location.  How do I manage my blood sugar before surgery? . Check your blood sugar at least 4 times a day, starting 2 days before surgery, to make sure that the level is not too high or low. o Check your blood sugar the morning of your surgery when you wake up and every 2 hours until you get to the Short Stay unit. . If your blood sugar is less than 70 mg/dL, you will need to treat for low blood sugar: o Do not take insulin. o Treat a low blood sugar (less than 70 mg/dL) with  cup of clear juice (cranberry or apple), 4 glucose tablets, OR glucose gel. o Recheck blood sugar in 15 minutes after treatment (to make sure it is greater than 70 mg/dL). If your blood sugar is not greater than 70 mg/dL on recheck, call [phone] for further instructions. . Report your blood sugar to the short stay nurse when you get to Short Stay.  . If you are admitted to the hospital after surgery: o Your blood sugar will be checked by the staff and you will probably be given insulin after surgery (instead of oral diabetes medicines) to make sure you have good blood sugar levels. o The goal for blood sugar control after surgery is 80-180 mg/dL.              Do not wear jewelry, make-up or nail polish.  Do not wear lotions, powders, or perfumes, or deoderant.  Do not shave 48  hours prior to surgery.  Men may shave face and neck.  Do not bring valuables to the hospital.  [facility] is not responsible for any belongings or valuables.  Contacts, dentures or bridgework may not be worn into surgery.  Leave your suitcase in the car.  After surgery it may be brought to your room.  For patients admitted to the hospital, discharge time will be determined by your treatment team.  Patients discharged the day of surgery will not be allowed to drive home.    Special instructions:     [facility]- Preparing For Surgery  Before surgery, you can play an important  role. Because skin is not sterile, your skin needs to be as free of germs as possible. You can reduce the number of germs on your skin by washing with CHG (chlorahexidine gluconate) Soap before surgery.  CHG is an antiseptic cleaner which kills germs and bonds with the skin to continue killing germs even after washing.  Please do not use if you have an allergy to CHG or antibacterial soaps. If your skin becomes reddened/irritated stop using the CHG.  Do not shave (including legs and underarms) for at least 48 hours prior to first CHG shower. It is OK to shave your face.  Please follow these instructions carefully.   1. Shower the NIGHT BEFORE SURGERY and the MORNING OF SURGERY with CHG.   2. If you chose to wash your hair, wash your hair first as usual with your normal shampoo.  3. After you shampoo, rinse your hair and body thoroughly to remove the shampoo.  4. Use CHG as you would any other liquid soap. You can apply CHG directly to the skin and wash gently with a scrungie or a clean washcloth.   5. Apply the CHG Soap to your body ONLY FROM THE NECK DOWN.  Do not use on open wounds or open sores. Avoid contact with your eyes, ears, mouth and genitals (private parts). Wash genitals (private parts) with your normal soap.  6. Wash thoroughly, paying special attention to the area where your surgery will be performed.  7. Thoroughly rinse your body with warm water from the neck down.  8. DO NOT shower/wash with your normal soap after using and rinsing off the CHG Soap.  9. Pat yourself dry with a CLEAN TOWEL.   10. Wear CLEAN PAJAMAS   11. Place CLEAN SHEETS on your bed the night of your first shower and DO NOT SLEEP WITH PETS.    Day of Surgery: Do not apply any deodorants/lotions. Please wear clean clothes to the hospital/surgery center.      Please read over the following fact sheets that you were given. MRSA Information

## 2016-03-14 ENCOUNTER — Encounter (HOSPITAL_COMMUNITY): Payer: Self-pay

## 2016-03-14 ENCOUNTER — Encounter (HOSPITAL_COMMUNITY)
Admission: RE | Admit: 2016-03-14 | Discharge: 2016-03-14 | Disposition: A | Discharge status: Home / Self Care | Payer: Medicare HMO | Source: Ambulatory Visit | Attending: Orthopedic Surgery | Admitting: Orthopedic Surgery

## 2016-03-14 DIAGNOSIS — M1611 Unilateral primary osteoarthritis, right hip: Secondary | ICD-10-CM | POA: Diagnosis not present

## 2016-03-14 DIAGNOSIS — E119 Type 2 diabetes mellitus without complications: Secondary | ICD-10-CM | POA: Insufficient documentation

## 2016-03-14 DIAGNOSIS — Z01818 Encounter for other preprocedural examination: Secondary | ICD-10-CM | POA: Diagnosis not present

## 2016-03-14 LAB — CBC
HCT: 38.7 % — ABNORMAL LOW (ref 39.0–52.0)
HEMOGLOBIN: 12.9 g/dL — AB (ref 13.0–17.0)
MCH: 22.2 pg — AB (ref 26.0–34.0)
MCHC: 33.3 g/dL (ref 30.0–36.0)
MCV: 66.7 fL — AB (ref 78.0–100.0)
Platelets: 387 10*3/uL (ref 150–400)
RBC: 5.8 MIL/uL (ref 4.22–5.81)
RDW: 21.3 % — ABNORMAL HIGH (ref 11.5–15.5)
WBC: 9 10*3/uL (ref 4.0–10.5)

## 2016-03-14 LAB — BASIC METABOLIC PANEL
ANION GAP: 9 (ref 5–15)
BUN: 9 mg/dL (ref 6–20)
CHLORIDE: 107 mmol/L (ref 101–111)
CO2: 24 mmol/L (ref 22–32)
Calcium: 8.9 mg/dL (ref 8.9–10.3)
Creatinine, Ser: 1.32 mg/dL — ABNORMAL HIGH (ref 0.61–1.24)
GFR calc non Af Amer: 59 mL/min — ABNORMAL LOW (ref >60)
Glucose, Bld: 94 mg/dL (ref 65–99)
POTASSIUM: 3.2 mmol/L — AB (ref 3.5–5.1)
SODIUM: 140 mmol/L (ref 135–145)

## 2016-03-14 LAB — URINE MICROSCOPIC-ADD ON: RBC / HPF: NONE SEEN RBC/hpf (ref 0–5)

## 2016-03-14 LAB — URINALYSIS, ROUTINE W REFLEX MICROSCOPIC
BILIRUBIN URINE: NEGATIVE
Glucose, UA: >1000 mg/dL — AB
Hgb urine dipstick: NEGATIVE
KETONES UR: NEGATIVE mg/dL
LEUKOCYTES UA: NEGATIVE
NITRITE: NEGATIVE
PH: 6 (ref 5.0–8.0)
Protein, ur: NEGATIVE mg/dL
SPECIFIC GRAVITY, URINE: 1.021 (ref 1.005–1.030)

## 2016-03-14 LAB — PROTIME-INR
INR: 0.95
PROTHROMBIN TIME: 12.6 s (ref 11.4–15.2)

## 2016-03-14 LAB — GLUCOSE, CAPILLARY: Glucose-Capillary: 84 mg/dL (ref 65–99)

## 2016-03-14 LAB — APTT: aPTT: 27 seconds (ref 24–36)

## 2016-03-14 LAB — NO BLOOD PRODUCTS

## 2016-03-14 LAB — SURGICAL PCR SCREEN
MRSA, PCR: NEGATIVE
Staphylococcus aureus: POSITIVE — AB

## 2016-03-14 NOTE — Progress Notes (Signed)
PCP: [facility]- EKG and last office visit requested No cardiologist or cardiac workup per pt report.  EKG: in last 6 mo, requested  Pt Jehovah's witness and blood refusal signed, per pt Dr. Percell Miller is aware of this.

## 2016-03-14 NOTE — Progress Notes (Signed)
   03/14/16 0930  OBSTRUCTIVE SLEEP APNEA  Have you ever been diagnosed with sleep apnea through a sleep study? No  Do you snore loudly (loud enough to be heard through closed doors)?  0  Do you often feel tired, fatigued, or sleepy during the daytime (such as falling asleep during driving or talking to someone)? 0  Has anyone observed you stop breathing during your sleep? 0  Do you have, or are you being treated for high blood pressure? 1  BMI more than 35 kg/m2? 1  Age > 50 (1-yes) 1  Neck circumference greater than:Male 16 inches or larger, Male 17inches or larger? 1  Male Gender (Yes=1) 1  Obstructive Sleep Apnea Score 5

## 2016-03-14 NOTE — Progress Notes (Signed)
PCR positive MSSA, prescription called to patient's pharmacy. Patient notified

## 2016-03-14 NOTE — Progress Notes (Signed)
Pt not arrived for PAT appointment, no answer with cell phone, when called home phone, was told patient was unavailable.

## 2016-03-15 ENCOUNTER — Other Ambulatory Visit (HOSPITAL_COMMUNITY): Payer: Self-pay

## 2016-03-15 LAB — HEMOGLOBIN A1C
HEMOGLOBIN A1C: 6.7 % — AB (ref 4.8–5.6)
MEAN PLASMA GLUCOSE: 146 mg/dL

## 2016-03-15 LAB — URINE CULTURE

## 2016-03-15 NOTE — Progress Notes (Addendum)
Anesthesia Chart Review: Patient is a 55 year old male scheduled for right THA, anterior approach on 03/26/16 by Dr. Edmonia Lynch. He is s/p left THA on 11/14/15.   History includes former smoker, DM2, HTN, hypothyroidism, HLD, rheumatic fever (teenager), sickle cell trait, substance abuse (reports last cocaine, '14), CVA with right sided weakness, anxiety, tonsillectomy, arthritis, gout, left THA 11/14/15.   PCP is Dr. Jeanie Cooks ([place]), last visit 01/29/16. He denied CP, SOB, dizziness. He was aware that patient was contemplating undergoing right THA in the near future.   Meds includes amlodipine, aspirin 325 mg, edarbyclor, Jardiance, glipizide, levothyroxine, Linzess, Robaxin, Toprol-XL, Norco, Roxicet, sildenafil (for ED), simvastatin, trazodone.  BP 137/74   Pulse (!) 51   Temp 36.7 C   Resp 20   Ht 5\' 9"  (1.753 m)   Wt 292 lb (132.5 kg)   SpO2 99%   BMI 43.12 kg/m    09/20/15 EKG ([facility]): SR, multiple PVCs, intraventricular conduction delay. PVCs are new, otherwise EKG appears stable since 03/27/11.  05/25/15 CXR ([facility] Care Everywhere): INDICATION: Positive PPD test TECHNIQUE: XR-Chest 2 views. Comparison none. FINDINGS: Lungs are clear. The cardiac and mediastinal contours are normal. No pleural effusions. Age-appropriate degenerative changes of the thoracic spine. IMPRESSION: No radiographic evidence of acute cardiopulmonary disease.   Preoperative labs noted. Cr 1.32 (previuosly 1.16-1.26 since 01/2015), H/H 12.9/38.7. PLT 387. PT/PTT WNL. A1c 6.7. UA is negative for leukocytes and nitrites. TRANSFUSE NO BLOOD PRODUCTS.  Reviewed above with anesthesiologist Dr. Linna Caprice. With new, multiple PVCs on most recent EKG and multiple CAD risk factors, cardiology pre-operative evaluation recommended. Voice message left with Claiborne Billings at Dr. Darene Lamer. Murphy's office.  [facility] Short Stay Center/Anesthesiology Phone [phone] 03/15/2016  12:22 PM  Addendum: Patient was seen by Jonelle Sidle, PA-C with [facility] on 03/18/16 for pre-operative evaluation. EKG repeated and showed: SB at 58 bpm, first degree AV block with occasional PVCs. Poor data quality (V1 lead appears to be off). Bhagat, PA-C wrote, "Surgical Clearance - The patient denies any cardiac symptoms. Admits "intermittetn skipped beats" after educating about PVCs.  No changes. Reviewed EKG with DOD Dr. Johnsie Cancel who read EKG from today same as it was 09/20/15. He had L total hip afterwards. Asymptomatic. His is revised cardiac risk score is 0.9% and is low risk for cardiac complication for surgery." PRN cardiology follow-up recommended.  [facility] Short Stay Center/Anesthesiology Phone [phone] 03/20/2016 12:21 PM

## 2016-03-18 ENCOUNTER — Ambulatory Visit (INDEPENDENT_AMBULATORY_CARE_PROVIDER_SITE_OTHER): Payer: Medicare HMO | Admitting: Physician Assistant

## 2016-03-18 ENCOUNTER — Encounter: Payer: Self-pay | Admitting: Physician Assistant

## 2016-03-18 VITALS — BP 122/82 | Ht 69.0 in | Wt 292.0 lb

## 2016-03-18 DIAGNOSIS — I10 Essential (primary) hypertension: Secondary | ICD-10-CM

## 2016-03-18 DIAGNOSIS — I493 Ventricular premature depolarization: Secondary | ICD-10-CM | POA: Diagnosis not present

## 2016-03-18 DIAGNOSIS — E784 Other hyperlipidemia: Secondary | ICD-10-CM

## 2016-03-18 DIAGNOSIS — E7849 Other hyperlipidemia: Secondary | ICD-10-CM

## 2016-03-18 DIAGNOSIS — Z01811 Encounter for preprocedural respiratory examination: Secondary | ICD-10-CM | POA: Diagnosis not present

## 2016-03-18 NOTE — Progress Notes (Signed)
Cardiology Office Note    Date:  03/18/2016   ID:  Phillip Turner, DOB August 30, 1960, MRN [id number]  PCP:  Philis Fendt, MD  Cardiologist:   New (Dr. Johnsie Cancel)  Chief Complaint: Surgical clearance  History of Present Illness:   Phillip Turner is a 55 y.o. male [place] witness with hx of HTN, HLD, DM, prior hx of substance abuse (quit 2013), hypothyroidism, sickle cell trait and remote hx of CVA who presented for surgical clearance of total R hip arthroplasty anterior approach.   Recently had Left total hip arthroplasty anterior approach on 11/14/15.  A1c 6.7 03/14/16.  Here for evaluation. Stated that seen by PCP 03/15/16 and noted abnormal EKG and sent here for further evaluation. Print on available for review. EKG of 09/20/15 showed PVCs.   The patient denies nausea, vomiting, fever, chest pain, shortness of breath, orthopnea, PND, dizziness, syncope, cough, congestion, abdominal pain, hematochezia, melena, lower extremity edema. He states that he has feel intermittent "skipped beats" after discussion with me.    Past Medical History:  Diagnosis Date  . Anxiety    from past drug use not longer having anxiety  . Arthritis   . Depression    past depression relating to drug use   . Diabetes mellitus   . Gout   . Hyperlipidemia   . Hypertension   . Hypothyroidism   . Rheumatic fever    as a teenager  . 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 Stroke [facility])    does not remember when, weakness on right side, some trouble speaking at times  . Substance abuse    former cocaine user- not since March 2014    Past Surgical History:  Procedure Laterality Date  . boil removed from tailbone     . COLONOSCOPY    . TONSILLECTOMY    . TOTAL HIP ARTHROPLASTY Left 11/14/2015   Procedure: LEFT TOTAL HIP ARTHROPLASTY ANTERIOR APPROACH;  Surgeon: Renette Butters, MD;  Location: [facility];  Service: Orthopedics;  Laterality: Left;    Current Medications:  Prior to Admission  medications   Medication Sig Start Date End Date Taking? Authorizing Provider  amLODipine (NORVASC) 10 MG tablet TAKE 1 TABLET BY MOUTH EVERY DAY FOR BLOOD PRESSURE 06/06/14  Yes Cassandria Anger, MD  aspirin 325 MG tablet Take 325 mg by mouth daily.   Yes Historical Provider, MD  colchicine 0.6 MG tablet TAKE 1 TABLET BY MOUTH TWICE DAILY FOR GOUT FLARE-UPS 09/12/14  Yes Evie Lacks Plotnikov, MD  EDARBYCLOR 40-25 MG TABS Take 1 tablet by mouth daily. Will stop 1 week prior to surgery 09/04/15  Yes Historical Provider, MD  empagliflozin (JARDIANCE) 10 MG TABS tablet Take 25 mg by mouth daily.    Yes Historical Provider, MD  glipiZIDE (GLUCOTROL) 5 MG tablet Take 5 mg by mouth 2 (two) times daily before a meal.    Yes Historical Provider, MD  HYDROcodone-acetaminophen (NORCO/VICODIN) 5-325 MG tablet Take 1 tablet by mouth 2 (two) times daily as needed for severe pain. 12/27/15  Yes Everlene Balls, MD  levothyroxine (SYNTHROID, LEVOTHROID) 100 MCG tablet Take 100 mcg by mouth daily.  08/04/15  Yes Historical Provider, MD  LINZESS 145 MCG CAPS capsule Take 1 tablet by mouth daily. 08/04/15  Yes Historical Provider, MD  methocarbamol (ROBAXIN) 500 MG tablet Take 1 tablet (500 mg total) by mouth every 6 (six) hours as needed for muscle spasms. Patient taking differently: Take 500 mg by  mouth 2 (two) times daily.  11/14/15  Yes Renette Butters, MD  metoprolol succinate (TOPROL-XL) 25 MG 24 hr tablet Take 25 mg by mouth daily.   Yes Historical Provider, MD  potassium chloride SA (K-DUR,KLOR-CON) 20 MEQ tablet Take 2 tablets (40 mEq total) by mouth 2 (two) times daily. Patient taking differently: Take 20 mEq by mouth 3 (three) times daily. Takes 1 tablet daily, can take another tablet if needed for fluid/edema 07/13/14  Yes Cassandria Anger, MD  sildenafil (VIAGRA) 100 MG tablet Take 100 mg by mouth as needed for erectile dysfunction.   Yes Historical Provider, MD  simvastatin (ZOCOR) 10 MG tablet Take 1 tablet by  mouth daily. 11/04/14  Yes Historical Provider, MD  traZODone (DESYREL) 100 MG tablet TAKE 2 TABLETS BY MOUTH EVERY NIGHT AT BEDTIME FOR SLEEP/DEPRESSION 07/07/14  Yes Cassandria Anger, MD   Allergies:   Clonidine derivatives; Shellfish allergy; Metformin and related; Naproxen; Indomethacin; and Other   Social History   Social History  . Marital status: Married    Spouse name: N/A  . Number of children: N/A  . Years of education: N/A   Social History Main Topics  . Smoking status: Former Smoker    Years: 5.00    Types: Cigarettes  . Smokeless tobacco: Never Used  . Alcohol use No  . Drug use: No     Comment: Cocaine, crack former- since 2014  . Sexual activity: Yes   Other Topics Concern  . None   Social History Narrative   Married   11th grade education   Not employed      Family History:  The patient's family history includes Coronary artery disease in his mother; Diabetes type II in his father; Hypertension in his father.   ROS:   Please see the history of present illness.    ROS All other systems reviewed and are negative.   PHYSICAL EXAM:   VS:  BP 122/82   Ht 5\' 9"  (1.753 m)   Wt 292 lb (132.5 kg)   BMI 43.12 kg/m    GEN: Well nourished, well developed, in no acute distress  HEENT: normal  Neck: no JVD, carotid bruits, or masses Cardiac:RRR; no murmurs, rubs, or gallops,no edema  Respiratory:  clear to auscultation bilaterally, normal work of breathing GI: soft, nontender, nondistended, + BS MS: no deformity or atrophy  Skin: warm and dry, no rash Neuro:  Alert and Oriented x 3, Strength and sensation are intact Psych: euthymic mood, full affect  Wt Readings from Last 3 Encounters:  03/18/16 292 lb (132.5 kg)  03/14/16 292 lb (132.5 kg)  12/27/15 291 lb 14.2 oz (132.4 kg)      Studies/Labs Reviewed:   EKG:  EKG is ordered today.  The ekg ordered today demonstrates sinus bradycardia at rate of 55 bpm with PVCs.   Recent Labs: 11/06/2015: ALT  32 03/14/2016: BUN 9; Creatinine, Ser 1.32; Hemoglobin 12.9; Platelets 387; Potassium 3.2; Sodium 140   Lipid Panel    Component Value Date/Time   CHOL 230 (H) 06/23/2013 1220   TRIG 182.0 (H) 06/23/2013 1220   HDL 85.30 06/23/2013 1220   CHOLHDL 3 06/23/2013 1220   VLDL 36.4 06/23/2013 1220   LDLDIRECT 117.5 06/23/2013 1220    Additional studies/ records that were reviewed today include:   As above    ASSESSMENT & PLAN:    1. Surgical Clearance - The patient denies any cardiac symptoms. Admits "intermittetn skipped beats" after educating about  PVCs.  No changes. Reviewed EKG with DOD Dr. Johnsie Cancel who read EKG from today same as it was 09/20/15. He had L total hip afterwards. Asymptomatic. His is revised cardiac risk score is 0.9% and is low risk for cardiac complication for surgery.   2. HTN - Stable and well controlled on current medications  3. HLD - Managed by PCP. Continue statin  4. PVCs - Continue BB.   F/u with PCP. Cardiology f/u PRN.     Medication Adjustments/Labs and Tests Ordered: Current medicines are reviewed at length with the patient today.  Concerns regarding medicines are outlined above.  Medication changes, Labs and Tests ordered today are listed in the Patient Instructions below. There are no Patient Instructions on file for this visit.   Jarrett Soho, [place]  03/18/2016 2:33 PM    [facility] [place], [place], [place]  [address] Phone: [phone]; Fax: [phone]

## 2016-03-20 ENCOUNTER — Encounter: Payer: Self-pay | Admitting: Physician Assistant

## 2016-03-25 MED ORDER — DEXTROSE 5 % IV SOLN
3.0000 g | INTRAVENOUS | Status: AC
  Administered 2016-03-26: 3 g via INTRAVENOUS
  Filled 2016-03-25: qty 3000

## 2016-03-25 MED ORDER — ACETAMINOPHEN 500 MG PO TABS
1000.0000 mg | ORAL_TABLET | Freq: Once | ORAL | Status: AC
  Administered 2016-03-26: 1000 mg via ORAL
  Filled 2016-03-25: qty 2

## 2016-03-25 MED ORDER — GABAPENTIN 300 MG PO CAPS
300.0000 mg | ORAL_CAPSULE | ORAL | Status: AC
Start: 2016-03-26 — End: 2016-03-26
  Administered 2016-03-26: 300 mg via ORAL
  Filled 2016-03-25: qty 1

## 2016-03-25 MED ORDER — TRANEXAMIC ACID 1000 MG/10ML IV SOLN
1000.0000 mg | INTRAVENOUS | Status: AC
  Administered 2016-03-26: 1000 mg via INTRAVENOUS
  Filled 2016-03-25: qty 10

## 2016-03-25 MED ORDER — LACTATED RINGERS IV SOLN
INTRAVENOUS | Status: DC
  Administered 2016-03-26 (×2): via INTRAVENOUS

## 2016-03-25 NOTE — Anesthesia Preprocedure Evaluation (Addendum)
Anesthesia Evaluation  Patient identified by MRN, date of birth, ID band Patient awake    Reviewed: Allergy & Precautions, H&P , NPO status , Patient's Chart, lab work & pertinent test results, reviewed documented beta blocker date and time   Airway Mallampati: II  TM Distance: >3 FB Neck ROM: full    Dental   Pulmonary former smoker,    breath sounds clear to auscultation       Cardiovascular hypertension, Pt. on medications and Pt. on home beta blockers  Rhythm:regular Rate:Normal     Neuro/Psych Anxiety Depression CVA    GI/Hepatic   Endo/Other  diabetes, Type 2Hypothyroidism Morbid obesity  Renal/GU Renal InsufficiencyRenal disease     Musculoskeletal  (+) Arthritis ,   Abdominal   Peds  Hematology  (+) J[facility]OVAH'S WITNESS (Will accept cell saver and albumin)  Anesthesia Other Findings   Reproductive/Obstetrics                            Lab Results  Component Value Date   WBC 9.0 03/14/2016   HGB 12.9 (L) 03/14/2016   HCT 38.7 (L) 03/14/2016   MCV 66.7 (L) 03/14/2016   PLT 387 03/14/2016   Lab Results  Component Value Date   CREATININE 1.32 (H) 03/14/2016   BUN 9 03/14/2016   NA 140 03/14/2016   K 3.2 (L) 03/14/2016   CL 107 03/14/2016   CO2 24 03/14/2016   Lab Results  Component Value Date   INR 0.95 03/14/2016   INR 1.05 11/06/2015    Anesthesia Physical  Anesthesia Plan  ASA: III  Anesthesia Plan: Spinal and MAC   Post-op Pain Management:    Induction: Intravenous  Airway Management Planned: Natural Airway and Simple Face Mask  Additional Equipment:   Intra-op Plan:   Post-operative Plan:   Informed Consent: I have reviewed the patients History and Physical, chart, labs and discussed the procedure including the risks, benefits and alternatives for the proposed anesthesia with the patient or authorized representative who has indicated his/her understanding  and acceptance.   Dental advisory given  Plan Discussed with: CRNA  Anesthesia Plan Comments:        Anesthesia Quick Evaluation

## 2016-03-26 ENCOUNTER — Inpatient Hospital Stay (HOSPITAL_COMMUNITY): Payer: Medicare HMO | Admitting: Anesthesiology

## 2016-03-26 ENCOUNTER — Encounter (HOSPITAL_COMMUNITY): Payer: Self-pay | Admitting: General Practice

## 2016-03-26 ENCOUNTER — Inpatient Hospital Stay (HOSPITAL_COMMUNITY): Payer: Medicare HMO | Admitting: Vascular Surgery

## 2016-03-26 ENCOUNTER — Inpatient Hospital Stay (HOSPITAL_COMMUNITY)
Admission: RE | Admit: 2016-03-26 | Discharge: 2016-03-28 | DRG: 470 | Disposition: A | Discharge status: Home Health | Payer: Medicare HMO | Source: Ambulatory Visit | Attending: Orthopedic Surgery | Admitting: Orthopedic Surgery

## 2016-03-26 ENCOUNTER — Inpatient Hospital Stay (HOSPITAL_COMMUNITY): Payer: Medicare HMO

## 2016-03-26 ENCOUNTER — Encounter (HOSPITAL_COMMUNITY)
Admission: RE | Disposition: A | Discharge status: Home Health | Payer: Self-pay | Source: Ambulatory Visit | Attending: Orthopedic Surgery

## 2016-03-26 DIAGNOSIS — E118 Type 2 diabetes mellitus with unspecified complications: Secondary | ICD-10-CM

## 2016-03-26 DIAGNOSIS — Z833 Family history of diabetes mellitus: Secondary | ICD-10-CM | POA: Diagnosis not present

## 2016-03-26 DIAGNOSIS — M109 Gout, unspecified: Secondary | ICD-10-CM | POA: Diagnosis present

## 2016-03-26 DIAGNOSIS — E039 Hypothyroidism, unspecified: Secondary | ICD-10-CM | POA: Diagnosis present

## 2016-03-26 DIAGNOSIS — E119 Type 2 diabetes mellitus without complications: Secondary | ICD-10-CM | POA: Diagnosis present

## 2016-03-26 DIAGNOSIS — Z91013 Allergy to seafood: Secondary | ICD-10-CM

## 2016-03-26 DIAGNOSIS — M1611 Unilateral primary osteoarthritis, right hip: Secondary | ICD-10-CM | POA: Diagnosis present

## 2016-03-26 DIAGNOSIS — Z888 Allergy status to other drugs, medicaments and biological substances status: Secondary | ICD-10-CM | POA: Diagnosis not present

## 2016-03-26 DIAGNOSIS — I10 Essential (primary) hypertension: Secondary | ICD-10-CM | POA: Diagnosis present

## 2016-03-26 DIAGNOSIS — Z7984 Long term (current) use of oral hypoglycemic drugs: Secondary | ICD-10-CM

## 2016-03-26 DIAGNOSIS — M25669 Stiffness of unspecified knee, not elsewhere classified: Secondary | ICD-10-CM

## 2016-03-26 DIAGNOSIS — Z886 Allergy status to analgesic agent status: Secondary | ICD-10-CM

## 2016-03-26 DIAGNOSIS — M25551 Pain in right hip: Secondary | ICD-10-CM | POA: Diagnosis present

## 2016-03-26 DIAGNOSIS — IMO0002 Reserved for concepts with insufficient information to code with codable children: Secondary | ICD-10-CM | POA: Diagnosis present

## 2016-03-26 DIAGNOSIS — Z8249 Family history of ischemic heart disease and other diseases of the circulatory system: Secondary | ICD-10-CM | POA: Diagnosis not present

## 2016-03-26 DIAGNOSIS — F329 Major depressive disorder, single episode, unspecified: Secondary | ICD-10-CM | POA: Diagnosis present

## 2016-03-26 DIAGNOSIS — E785 Hyperlipidemia, unspecified: Secondary | ICD-10-CM | POA: Diagnosis present

## 2016-03-26 DIAGNOSIS — Z87891 Personal history of nicotine dependence: Secondary | ICD-10-CM | POA: Diagnosis not present

## 2016-03-26 DIAGNOSIS — R413 Other amnesia: Secondary | ICD-10-CM | POA: Diagnosis present

## 2016-03-26 DIAGNOSIS — F419 Anxiety disorder, unspecified: Secondary | ICD-10-CM | POA: Diagnosis present

## 2016-03-26 DIAGNOSIS — E1165 Type 2 diabetes mellitus with hyperglycemia: Secondary | ICD-10-CM | POA: Diagnosis present

## 2016-03-26 DIAGNOSIS — Z419 Encounter for procedure for purposes other than remedying health state, unspecified: Secondary | ICD-10-CM

## 2016-03-26 DIAGNOSIS — Z96642 Presence of left artificial hip joint: Secondary | ICD-10-CM | POA: Diagnosis present

## 2016-03-26 DIAGNOSIS — D571 Sickle-cell disease without crisis: Secondary | ICD-10-CM | POA: Diagnosis present

## 2016-03-26 DIAGNOSIS — Z8673 Personal history of transient ischemic attack (TIA), and cerebral infarction without residual deficits: Secondary | ICD-10-CM | POA: Diagnosis not present

## 2016-03-26 DIAGNOSIS — F32A Depression, unspecified: Secondary | ICD-10-CM | POA: Diagnosis present

## 2016-03-26 DIAGNOSIS — R262 Difficulty in walking, not elsewhere classified: Secondary | ICD-10-CM

## 2016-03-26 HISTORY — PX: TOTAL HIP ARTHROPLASTY: SHX124

## 2016-03-26 LAB — GLUCOSE, CAPILLARY
GLUCOSE-CAPILLARY: 113 mg/dL — AB (ref 65–99)
GLUCOSE-CAPILLARY: 160 mg/dL — AB (ref 65–99)
Glucose-Capillary: 111 mg/dL — ABNORMAL HIGH (ref 65–99)
Glucose-Capillary: 69 mg/dL (ref 65–99)

## 2016-03-26 SURGERY — ARTHROPLASTY, HIP, TOTAL, ANTERIOR APPROACH
Anesthesia: Monitor Anesthesia Care | Site: Hip | Laterality: Right

## 2016-03-26 MED ORDER — LEVOTHYROXINE SODIUM 100 MCG PO TABS
100.0000 ug | ORAL_TABLET | Freq: Every day | ORAL | Status: DC
  Administered 2016-03-27 – 2016-03-28 (×2): 100 ug via ORAL
  Filled 2016-03-26 (×2): qty 1

## 2016-03-26 MED ORDER — ACETAMINOPHEN 650 MG RE SUPP: 650.0000 mg | Freq: Four times a day (QID) | RECTAL | Status: DC | PRN

## 2016-03-26 MED ORDER — ALBUMIN HUMAN 5 % IV SOLN
INTRAVENOUS | Status: DC | PRN
  Administered 2016-03-26 (×2): via INTRAVENOUS

## 2016-03-26 MED ORDER — TRAZODONE HCL 100 MG PO TABS
100.0000 mg | ORAL_TABLET | Freq: Every day | ORAL | Status: DC
  Administered 2016-03-26 – 2016-03-27 (×2): 100 mg via ORAL
  Filled 2016-03-26 (×2): qty 1

## 2016-03-26 MED ORDER — FENTANYL CITRATE (PF) 100 MCG/2ML IJ SOLN
INTRAMUSCULAR | Status: AC
  Filled 2016-03-26: qty 4

## 2016-03-26 MED ORDER — HYDROMORPHONE HCL 2 MG/ML IJ SOLN
INTRAMUSCULAR | Status: AC
  Filled 2016-03-26: qty 1

## 2016-03-26 MED ORDER — FENTANYL CITRATE (PF) 100 MCG/2ML IJ SOLN
INTRAMUSCULAR | Status: AC
  Filled 2016-03-26: qty 2

## 2016-03-26 MED ORDER — POTASSIUM CHLORIDE CRYS ER 20 MEQ PO TBCR
20.0000 meq | EXTENDED_RELEASE_TABLET | Freq: Three times a day (TID) | ORAL | Status: DC
  Administered 2016-03-26 – 2016-03-28 (×6): 20 meq via ORAL
  Filled 2016-03-26 (×6): qty 1

## 2016-03-26 MED ORDER — BUPIVACAINE HCL (PF) 0.25 % IJ SOLN
INTRAMUSCULAR | Status: AC
  Filled 2016-03-26: qty 30

## 2016-03-26 MED ORDER — OXYCODONE HCL 5 MG PO TABS
5.0000 mg | ORAL_TABLET | ORAL | Status: DC | PRN
  Administered 2016-03-26: 10 mg via ORAL
  Administered 2016-03-26: 5 mg via ORAL
  Administered 2016-03-26 – 2016-03-28 (×6): 10 mg via ORAL
  Filled 2016-03-26 (×7): qty 2

## 2016-03-26 MED ORDER — AMLODIPINE BESYLATE 10 MG PO TABS
10.0000 mg | ORAL_TABLET | Freq: Every day | ORAL | Status: DC
  Administered 2016-03-27 – 2016-03-28 (×2): 10 mg via ORAL
  Filled 2016-03-26 (×2): qty 1

## 2016-03-26 MED ORDER — ASPIRIN EC 325 MG PO TBEC: 325.0000 mg | DELAYED_RELEASE_TABLET | Freq: Every day | ORAL | 0 refills | Status: AC

## 2016-03-26 MED ORDER — GLIPIZIDE 5 MG PO TABS
5.0000 mg | ORAL_TABLET | Freq: Two times a day (BID) | ORAL | Status: DC
  Administered 2016-03-26 – 2016-03-28 (×4): 5 mg via ORAL
  Filled 2016-03-26 (×4): qty 1

## 2016-03-26 MED ORDER — HYDROCHLOROTHIAZIDE 25 MG PO TABS
25.0000 mg | ORAL_TABLET | Freq: Every day | ORAL | Status: DC
  Administered 2016-03-26 – 2016-03-27 (×2): 25 mg via ORAL
  Filled 2016-03-26 (×2): qty 1

## 2016-03-26 MED ORDER — DIPHENHYDRAMINE HCL 12.5 MG/5ML PO ELIX: 12.5000 mg | ORAL_SOLUTION | ORAL | Status: DC | PRN

## 2016-03-26 MED ORDER — SIMVASTATIN 10 MG PO TABS
10.0000 mg | ORAL_TABLET | Freq: Every day | ORAL | Status: DC
  Administered 2016-03-26 – 2016-03-27 (×2): 10 mg via ORAL
  Filled 2016-03-26 (×2): qty 1

## 2016-03-26 MED ORDER — EPINEPHRINE PF 1 MG/ML IJ SOLN
INTRAMUSCULAR | Status: DC | PRN
  Administered 2016-03-26: .15 mL

## 2016-03-26 MED ORDER — MIDAZOLAM HCL 2 MG/2ML IJ SOLN
INTRAMUSCULAR | Status: AC
  Filled 2016-03-26: qty 2

## 2016-03-26 MED ORDER — LINACLOTIDE 145 MCG PO CAPS
145.0000 ug | ORAL_CAPSULE | Freq: Every day | ORAL | Status: DC
  Administered 2016-03-28: 145 ug via ORAL
  Filled 2016-03-26 (×4): qty 1

## 2016-03-26 MED ORDER — DEXAMETHASONE SODIUM PHOSPHATE 10 MG/ML IJ SOLN
10.0000 mg | Freq: Once | INTRAMUSCULAR | Status: AC
  Administered 2016-03-27: 10 mg via INTRAVENOUS
  Filled 2016-03-26: qty 1

## 2016-03-26 MED ORDER — LACTATED RINGERS IV SOLN
INTRAVENOUS | Status: DC
  Administered 2016-03-27: 100 mL/h via INTRAVENOUS

## 2016-03-26 MED ORDER — SODIUM CHLORIDE FLUSH 0.9 % IV SOLN
INTRAVENOUS | Status: DC | PRN
  Administered 2016-03-26: 19 mL via INTRAVENOUS

## 2016-03-26 MED ORDER — PROMETHAZINE HCL 25 MG/ML IJ SOLN: 6.2500 mg | INTRAMUSCULAR | Status: DC | PRN

## 2016-03-26 MED ORDER — DOCUSATE SODIUM 100 MG PO CAPS: 100.0000 mg | ORAL_CAPSULE | Freq: Two times a day (BID) | ORAL | 0 refills | Status: DC

## 2016-03-26 MED ORDER — METOCLOPRAMIDE HCL 5 MG PO TABS
5.0000 mg | ORAL_TABLET | Freq: Three times a day (TID) | ORAL | Status: DC | PRN
Start: 2016-03-26 — End: 2016-03-28

## 2016-03-26 MED ORDER — INSULIN ASPART 100 UNIT/ML ~~LOC~~ SOLN
0.0000 [IU] | SUBCUTANEOUS | Status: DC
  Administered 2016-03-27: 2 [IU] via SUBCUTANEOUS
  Administered 2016-03-27: 8 [IU] via SUBCUTANEOUS
  Administered 2016-03-27 – 2016-03-28 (×4): 2 [IU] via SUBCUTANEOUS

## 2016-03-26 MED ORDER — OXYCODONE HCL 5 MG PO TABS
ORAL_TABLET | ORAL | Status: AC
  Filled 2016-03-26: qty 1

## 2016-03-26 MED ORDER — OXYCODONE-ACETAMINOPHEN 5-325 MG PO TABS: 1.0000 | ORAL_TABLET | ORAL | 0 refills | Status: DC | PRN

## 2016-03-26 MED ORDER — CHLORHEXIDINE GLUCONATE 4 % EX LIQD: 60.0000 mL | Freq: Once | CUTANEOUS | Status: DC

## 2016-03-26 MED ORDER — ONDANSETRON HCL 4 MG PO TABS: 4.0000 mg | ORAL_TABLET | Freq: Four times a day (QID) | ORAL | Status: DC | PRN

## 2016-03-26 MED ORDER — METHOCARBAMOL 500 MG PO TABS
500.0000 mg | ORAL_TABLET | Freq: Four times a day (QID) | ORAL | Status: DC | PRN
  Administered 2016-03-26: 500 mg via ORAL

## 2016-03-26 MED ORDER — LIDOCAINE HCL (CARDIAC) 20 MG/ML IV SOLN
INTRAVENOUS | Status: DC | PRN
  Administered 2016-03-26: 80 mg via INTRAVENOUS

## 2016-03-26 MED ORDER — MORPHINE SULFATE (PF) 2 MG/ML IV SOLN
2.0000 mg | INTRAVENOUS | Status: DC | PRN
  Administered 2016-03-27: 2 mg via INTRAVENOUS
  Filled 2016-03-26: qty 1

## 2016-03-26 MED ORDER — CANAGLIFLOZIN 100 MG PO TABS
100.0000 mg | ORAL_TABLET | Freq: Every day | ORAL | Status: DC
  Administered 2016-03-27: 100 mg via ORAL
  Filled 2016-03-26 (×2): qty 1

## 2016-03-26 MED ORDER — IRBESARTAN 300 MG PO TABS
300.0000 mg | ORAL_TABLET | Freq: Every day | ORAL | Status: DC
  Administered 2016-03-26 – 2016-03-27 (×2): 300 mg via ORAL
  Filled 2016-03-26 (×2): qty 1

## 2016-03-26 MED ORDER — 0.9 % SODIUM CHLORIDE (POUR BTL) OPTIME
TOPICAL | Status: DC | PRN
  Administered 2016-03-26: 1000 mL

## 2016-03-26 MED ORDER — SUCCINYLCHOLINE CHLORIDE 20 MG/ML IJ SOLN
INTRAMUSCULAR | Status: DC | PRN
  Administered 2016-03-26: 140 mg via INTRAVENOUS

## 2016-03-26 MED ORDER — MIDAZOLAM HCL 5 MG/5ML IJ SOLN
INTRAMUSCULAR | Status: DC | PRN
  Administered 2016-03-26: 2 mg via INTRAVENOUS

## 2016-03-26 MED ORDER — ACETAMINOPHEN 325 MG PO TABS
650.0000 mg | ORAL_TABLET | Freq: Four times a day (QID) | ORAL | Status: DC | PRN
  Administered 2016-03-27 – 2016-03-28 (×3): 650 mg via ORAL
  Filled 2016-03-26 (×3): qty 2

## 2016-03-26 MED ORDER — AZILSARTAN-CHLORTHALIDONE 40-25 MG PO TABS: 1.0000 | ORAL_TABLET | Freq: Every day | ORAL | Status: DC

## 2016-03-26 MED ORDER — COLCHICINE 0.6 MG PO TABS: 0.6000 mg | ORAL_TABLET | Freq: Two times a day (BID) | ORAL | Status: DC

## 2016-03-26 MED ORDER — DOCUSATE SODIUM 100 MG PO CAPS
100.0000 mg | ORAL_CAPSULE | Freq: Two times a day (BID) | ORAL | Status: DC
  Administered 2016-03-26 – 2016-03-28 (×3): 100 mg via ORAL
  Filled 2016-03-26 (×3): qty 1

## 2016-03-26 MED ORDER — BUPIVACAINE HCL (PF) 0.25 % IJ SOLN
INTRAMUSCULAR | Status: DC | PRN
  Administered 2016-03-26: 30 mL

## 2016-03-26 MED ORDER — METHOCARBAMOL 500 MG PO TABS
ORAL_TABLET | ORAL | Status: AC
  Filled 2016-03-26: qty 1

## 2016-03-26 MED ORDER — ONDANSETRON HCL 4 MG/2ML IJ SOLN
INTRAMUSCULAR | Status: DC | PRN
  Administered 2016-03-26: 4 mg via INTRAVENOUS

## 2016-03-26 MED ORDER — PROPOFOL 10 MG/ML IV BOLUS
INTRAVENOUS | Status: DC | PRN
  Administered 2016-03-26: 200 mg via INTRAVENOUS

## 2016-03-26 MED ORDER — PROPOFOL 10 MG/ML IV BOLUS
INTRAVENOUS | Status: AC
  Filled 2016-03-26: qty 20

## 2016-03-26 MED ORDER — COLCHICINE 0.6 MG PO TABS
0.6000 mg | ORAL_TABLET | Freq: Two times a day (BID) | ORAL | Status: DC
  Administered 2016-03-26 – 2016-03-28 (×4): 0.6 mg via ORAL
  Filled 2016-03-26 (×4): qty 1

## 2016-03-26 MED ORDER — FLEET ENEMA 7-19 GM/118ML RE ENEM: 1.0000 | ENEMA | Freq: Once | RECTAL | Status: DC | PRN

## 2016-03-26 MED ORDER — METOCLOPRAMIDE HCL 5 MG/ML IJ SOLN: 5.0000 mg | Freq: Three times a day (TID) | INTRAMUSCULAR | Status: DC | PRN

## 2016-03-26 MED ORDER — HYDROMORPHONE HCL 1 MG/ML IJ SOLN
0.2500 mg | INTRAMUSCULAR | Status: DC | PRN
  Administered 2016-03-26 (×2): 0.5 mg via INTRAVENOUS

## 2016-03-26 MED ORDER — SUGAMMADEX SODIUM 500 MG/5ML IV SOLN
INTRAVENOUS | Status: AC
  Filled 2016-03-26: qty 5

## 2016-03-26 MED ORDER — PHENOL 1.4 % MT LIQD: 1.0000 | OROMUCOSAL | Status: DC | PRN

## 2016-03-26 MED ORDER — METOPROLOL SUCCINATE ER 25 MG PO TB24
25.0000 mg | ORAL_TABLET | Freq: Every day | ORAL | Status: DC
  Filled 2016-03-26: qty 1

## 2016-03-26 MED ORDER — ROCURONIUM BROMIDE 100 MG/10ML IV SOLN
INTRAVENOUS | Status: DC | PRN
  Administered 2016-03-26: 50 mg via INTRAVENOUS
  Administered 2016-03-26 (×2): 20 mg via INTRAVENOUS

## 2016-03-26 MED ORDER — ONDANSETRON HCL 4 MG/2ML IJ SOLN: 4.0000 mg | Freq: Four times a day (QID) | INTRAMUSCULAR | Status: DC | PRN

## 2016-03-26 MED ORDER — ACETAMINOPHEN 325 MG PO TABS
650.0000 mg | ORAL_TABLET | Freq: Four times a day (QID) | ORAL | Status: AC
  Administered 2016-03-26 – 2016-03-27 (×4): 650 mg via ORAL
  Filled 2016-03-26 (×4): qty 2

## 2016-03-26 MED ORDER — KETOROLAC TROMETHAMINE 30 MG/ML IJ SOLN
INTRAMUSCULAR | Status: DC | PRN
  Administered 2016-03-26: 30 mg via INTRAVENOUS

## 2016-03-26 MED ORDER — MENTHOL 3 MG MT LOZG: 1.0000 | LOZENGE | OROMUCOSAL | Status: DC | PRN

## 2016-03-26 MED ORDER — CEFAZOLIN SODIUM-DEXTROSE 2-4 GM/100ML-% IV SOLN
2.0000 g | Freq: Four times a day (QID) | INTRAVENOUS | Status: AC
  Administered 2016-03-26 (×2): 2 g via INTRAVENOUS
  Filled 2016-03-26 (×2): qty 100

## 2016-03-26 MED ORDER — SORBITOL 70 % SOLN: 30.0000 mL | Freq: Every day | Status: DC | PRN

## 2016-03-26 MED ORDER — METHOCARBAMOL 500 MG PO TABS: 500.0000 mg | ORAL_TABLET | Freq: Four times a day (QID) | ORAL | 0 refills | Status: DC | PRN

## 2016-03-26 MED ORDER — POLYETHYLENE GLYCOL 3350 17 G PO PACK: 17.0000 g | PACK | Freq: Every day | ORAL | Status: DC | PRN

## 2016-03-26 MED ORDER — ASPIRIN EC 325 MG PO TBEC
325.0000 mg | DELAYED_RELEASE_TABLET | Freq: Every day | ORAL | Status: DC
  Administered 2016-03-27 – 2016-03-28 (×2): 325 mg via ORAL
  Filled 2016-03-26 (×2): qty 1

## 2016-03-26 MED ORDER — OMEPRAZOLE 20 MG PO CPDR: 20.0000 mg | DELAYED_RELEASE_CAPSULE | Freq: Every day | ORAL | 2 refills | Status: DC

## 2016-03-26 MED ORDER — SENNA 8.6 MG PO TABS
1.0000 | ORAL_TABLET | Freq: Two times a day (BID) | ORAL | Status: DC
  Administered 2016-03-26 – 2016-03-28 (×3): 8.6 mg via ORAL
  Filled 2016-03-26 (×3): qty 1

## 2016-03-26 MED ORDER — ONDANSETRON HCL 4 MG PO TABS: 4.0000 mg | ORAL_TABLET | Freq: Three times a day (TID) | ORAL | 0 refills | Status: DC | PRN

## 2016-03-26 MED ORDER — EPHEDRINE SULFATE 50 MG/ML IJ SOLN
INTRAMUSCULAR | Status: DC | PRN
  Administered 2016-03-26: 10 mg via INTRAVENOUS
  Administered 2016-03-26: 20 mg via INTRAVENOUS
  Administered 2016-03-26 (×2): 10 mg via INTRAVENOUS

## 2016-03-26 MED ORDER — TRANEXAMIC ACID 1000 MG/10ML IV SOLN
2000.0000 mg | INTRAVENOUS | Status: AC
  Administered 2016-03-26: 2000 mg via TOPICAL
  Filled 2016-03-26: qty 20

## 2016-03-26 MED ORDER — SUGAMMADEX SODIUM 200 MG/2ML IV SOLN
INTRAVENOUS | Status: DC | PRN
  Administered 2016-03-26: 260 mg via INTRAVENOUS

## 2016-03-26 MED ORDER — EPINEPHRINE PF 1 MG/ML IJ SOLN
INTRAMUSCULAR | Status: AC
  Filled 2016-03-26: qty 1

## 2016-03-26 MED ORDER — FENTANYL CITRATE (PF) 100 MCG/2ML IJ SOLN
INTRAMUSCULAR | Status: DC | PRN
  Administered 2016-03-26 (×2): 50 ug via INTRAVENOUS
  Administered 2016-03-26: 150 ug via INTRAVENOUS
  Administered 2016-03-26: 50 ug via INTRAVENOUS

## 2016-03-26 MED ORDER — METHOCARBAMOL 1000 MG/10ML IJ SOLN
500.0000 mg | Freq: Four times a day (QID) | INTRAMUSCULAR | Status: DC | PRN
  Filled 2016-03-26: qty 5

## 2016-03-26 SURGICAL SUPPLY — 49 items
BAG DECANTER FOR FLEXI CONT (MISCELLANEOUS) IMPLANT
BLADE SAG 18X100X1.27 (BLADE) ×3 IMPLANT
BLADE SAW SGTL 18X1.27X75 (BLADE) IMPLANT
BLADE SAW SGTL 18X1.27X75MM (BLADE)
CAPT HIP TOTAL 2 ×3 IMPLANT
CLOSURE STERI-STRIP 1/2X4 (GAUZE/BANDAGES/DRESSINGS) ×1
CLSR STERI-STRIP ANTIMIC 1/2X4 (GAUZE/BANDAGES/DRESSINGS) ×2 IMPLANT
COVER PERINEAL POST (MISCELLANEOUS) ×3 IMPLANT
COVER SURGICAL LIGHT HANDLE (MISCELLANEOUS) ×3 IMPLANT
DRAPE C-ARM 42X72 X-RAY (DRAPES) ×3 IMPLANT
DRAPE STERI IOBAN 125X83 (DRAPES) ×3 IMPLANT
DRAPE U-SHAPE 47X51 STRL (DRAPES) IMPLANT
DRSG MEPILEX BORDER 4X8 (GAUZE/BANDAGES/DRESSINGS) ×3 IMPLANT
DURAPREP 26ML APPLICATOR (WOUND CARE) ×3 IMPLANT
ELECT BLADE 4.0 EZ CLEAN MEGAD (MISCELLANEOUS) ×3
ELECT REM PT RETURN 9FT ADLT (ELECTROSURGICAL) ×3
ELECTRODE BLDE 4.0 EZ CLN MEGD (MISCELLANEOUS) ×1 IMPLANT
ELECTRODE REM PT RTRN 9FT ADLT (ELECTROSURGICAL) ×1 IMPLANT
FACESHIELD WRAPAROUND (MASK) ×6 IMPLANT
GLOVE BIO SURGEON STRL SZ7.5 (GLOVE) ×9 IMPLANT
GLOVE BIOGEL PI IND STRL 8 (GLOVE) ×2 IMPLANT
GLOVE BIOGEL PI INDICATOR 8 (GLOVE) ×4
GOWN STRL REUS W/ TWL LRG LVL3 (GOWN DISPOSABLE) ×2 IMPLANT
GOWN STRL REUS W/TWL LRG LVL3 (GOWN DISPOSABLE) ×4
KIT BASIN OR (CUSTOM PROCEDURE TRAY) ×3 IMPLANT
KIT ROOM TURNOVER OR (KITS) ×3 IMPLANT
MANIFOLD NEPTUNE II (INSTRUMENTS) ×3 IMPLANT
NDL SAFETY ECLIPSE 18X1.5 (NEEDLE) IMPLANT
NEEDLE HYPO 18GX1.5 SHARP (NEEDLE)
NEEDLE HYPO 22GX1.5 SAFETY (NEEDLE) ×3 IMPLANT
NS IRRIG 1000ML POUR BTL (IV SOLUTION) ×3 IMPLANT
PACK TOTAL JOINT (CUSTOM PROCEDURE TRAY) ×3 IMPLANT
PACK UNIVERSAL I (CUSTOM PROCEDURE TRAY) ×3 IMPLANT
PAD ARMBOARD 7.5X6 YLW CONV (MISCELLANEOUS) ×3 IMPLANT
SPONGE LAP 18X18 X RAY DECT (DISPOSABLE) IMPLANT
SUT MNCRL AB 4-0 PS2 18 (SUTURE) ×3 IMPLANT
SUT MON AB 2-0 CT1 36 (SUTURE) ×3 IMPLANT
SUT VIC AB 0 CT1 27 (SUTURE) ×2
SUT VIC AB 0 CT1 27XBRD ANBCTR (SUTURE) ×1 IMPLANT
SUT VIC AB 1 CT1 27 (SUTURE) ×2
SUT VIC AB 1 CT1 27XBRD ANBCTR (SUTURE) ×1 IMPLANT
SYR 50ML LL SCALE MARK (SYRINGE) ×3 IMPLANT
SYRINGE 20CC LL (MISCELLANEOUS) IMPLANT
TOWEL OR 17X24 6PK STRL BLUE (TOWEL DISPOSABLE) ×3 IMPLANT
TOWEL OR 17X26 10 PK STRL BLUE (TOWEL DISPOSABLE) ×3 IMPLANT
TRAY CATH 16FR W/PLASTIC CATH (SET/KITS/TRAYS/PACK) IMPLANT
TRAY FOLEY CATH 16FRSI W/METER (SET/KITS/TRAYS/PACK) IMPLANT
WATER STERILE IRR 1000ML POUR (IV SOLUTION) ×3 IMPLANT
YANKAUER SUCT BULB TIP NO VENT (SUCTIONS) ×6 IMPLANT

## 2016-03-26 NOTE — Interval H&P Note (Signed)
History and Physical Interval Note:  03/26/2016 7:20 AM  Phillip Turner  has presented today for surgery, with the diagnosis of OA RIGHT HIP  The various methods of treatment have been discussed with the patient and family. After consideration of risks, benefits and other options for treatment, the patient has consented to  Procedure(s): TOTAL HIP ARTHROPLASTY ANTERIOR APPROACH (Right) as a surgical intervention .  The patient's history has been reviewed, patient examined, no change in status, stable for surgery.  I have reviewed the patient's chart and labs.  Questions were answered to the patient's satisfaction.     [name]

## 2016-03-26 NOTE — Progress Notes (Signed)
Pt arrived to floor alert and orientated, with some pain oxy 10 mg given

## 2016-03-26 NOTE — Anesthesia Postprocedure Evaluation (Signed)
Anesthesia Post Note  Patient: [employer]  Procedure(s) Performed: Procedure(s) (LRB): RIGHT TOTAL HIP ARTHROPLASTY ANTERIOR APPROACH (Right)  Patient location during evaluation: PACU Anesthesia Type: General Level of consciousness: awake and alert Pain management: pain level controlled Vital Signs Assessment: post-procedure vital signs reviewed and stable Respiratory status: spontaneous breathing, nonlabored ventilation, respiratory function stable and patient connected to nasal cannula oxygen Cardiovascular status: blood pressure returned to baseline and stable Postop Assessment: no signs of nausea or vomiting Anesthetic complications: no    Last Vitals:  Vitals:   03/26/16 1400 03/26/16 1430  BP:  132/73  Pulse: (!) 50 (!) 52  Resp: 14 15  Temp:  36.7 C    Last Pain:  Vitals:   03/26/16 1358  TempSrc:   PainSc: 0-No pain                 Tiajuana Amass

## 2016-03-26 NOTE — Op Note (Signed)
03/26/2016  9:41 AM  PATIENT:  Phillip Turner   MRN: [id number]  PRE-OPERATIVE DIAGNOSIS:  Osteoarthritis RIGHT HIP  POST-OPERATIVE DIAGNOSIS:  Osteoarthritis RIGHT HIP  PROCEDURE:  Procedure(s): RIGHT TOTAL HIP ARTHROPLASTY ANTERIOR APPROACH  PREOPERATIVE INDICATIONS:    TRASON SHIFFLET is an 55 y.o. male who has a diagnosis of Primary osteoarthritis of right hip and elected for surgical management after failing conservative treatment.  The risks benefits and alternatives were discussed with the patient including but not limited to the risks of nonoperative treatment, versus surgical intervention including infection, bleeding, nerve injury, periprosthetic fracture, the need for revision surgery, dislocation, leg length discrepancy, blood clots, cardiopulmonary complications, morbidity, mortality, among others, and they were willing to proceed.     OPERATIVE REPORT     SURGEON:   Caci Orren, Ernesta Amble, MD    ASSISTANT:  Roxan Hockey, PA-C, he was present and scrubbed throughout the case, critical for completion in a timely fashion, and for retraction, instrumentation, and closure.     ANESTHESIA:  General    COMPLICATIONS:  None.     COMPONENTS:  Stryker acolade fit femur size 5 with a 36 mm -2.5 head ball and a PSL acetabular shell size 52 with a  polyethylene liner    PROCEDURE IN DETAIL:   The patient was met in the holding area and  identified.  The appropriate hip was identified and marked at the operative site.  The patient was then transported to the OR  and  placed under anesthesia per that record.  At that point, the patient was  placed in the supine position and  secured to the operating room table and all bony prominences padded. He received pre-operative antibiotics    The operative lower extremity was prepped from the iliac crest to the distal leg.  Sterile draping was performed.  Time out was performed prior to incision.      Skin incision was made just 2 cm  lateral to the ASIS  extending in line with the tensor fascia lata. Electrocautery was used to control all bleeders. I dissected down sharply to the fascia of the tensor fascia lata was confirmed that the muscle fibers beneath were running posteriorly. I then incised the fascia over the superficial tensor fascia lata in line with the incision. The fascia was elevated off the anterior aspect of the muscle the muscle was retracted posteriorly and protected throughout the case. I then used electrocautery to incise the tensor fascia lata fascia control and all bleeders. Immediately visible was the fat over top of the anterior neck and capsule.  I removed the anterior fat from the capsule and elevated the rectus muscle off of the anterior capsule. I then removed a large time of capsule. The retractors were then placed over the anterior acetabulum as well as around the superior and inferior neck.  I then removed a section of the femoral neck and a napkin ring fashion. Then used the power course to remove the femoral head from the acetabulum and thoroughly irrigated the acetabulum. I sized the femoral head.    I then exposed the deep acetabulum, cleared out any tissue including the ligamentum teres.   After adequate visualization, I excised the labrum, and then sequentially reamed.  I then impacted the acetabular implant into place using fluoroscopy for guidance.  Appropriate version and inclination was confirmed clinically matching their bony anatomy, and with fluoroscopy.  I placed a 77m screw in the posterior/superio position with an excellent bite.  I then placed the polyethylene liner in place  I then adducted the leg and released the external rotators from the posterior femur allowing it to be easily delivered up lateral and anterior to the acetabulum for preparation of the femoral canal.    I then prepared the proximal femur using the cookie-cutter and then sequentially reamed and broached.  A  trial broach, neck, and head was utilized, and I reduced the hip and used floroscopy to assess the neck length and femoral implant.  I then impacted the femoral prosthesis into place into the appropriate version. The hip was then reduced and fluoroscopy confirmed appropriate position. Leg lengths were restored.  I then irrigated the hip copiously again with, and repaired the fascia with Vicryl, followed by monocryl for the subcutaneous tissue, Monocryl for the skin, Steri-Strips and sterile gauze. The patient was then awakened and returned to PACU in stable and satisfactory condition. There were no complications.  POST OPERATIVE PLAN: WBAT, DVT px: SCD's/TED and ASA  Edmonia Lynch, MD Orthopedic Surgeon [phone]

## 2016-03-26 NOTE — Discharge Instructions (Signed)

## 2016-03-26 NOTE — Anesthesia Procedure Notes (Signed)
Procedure Name: Intubation Date/Time: 03/26/2016 8:00 AM Performed by: Salli Quarry Marquis Diles Pre-anesthesia Checklist: Patient identified, Emergency Drugs available, Suction available and Patient being monitored Patient Re-evaluated:Patient Re-evaluated prior to inductionOxygen Delivery Method: Circle System Utilized Preoxygenation: Pre-oxygenation with 100% oxygen Intubation Type: IV induction Ventilation: Mask ventilation without difficulty Laryngoscope Size: Mac and 4 Grade View: Grade II Tube type: Oral Tube size: 7.5 mm Number of attempts: 2 Airway Equipment and Method: Stylet Placement Confirmation: ETT inserted through vocal cords under direct vision,  positive ETCO2 and breath sounds checked- equal and bilateral Secured at: 24 cm Tube secured with: Tape Dental Injury: Teeth and Oropharynx as per pre-operative assessment  Comments: DL x1 with MAC 3 - blade not long enough to reach vallecula, DL x2 with MAC 4, grade 2 view, atraumatic intubation of 7.5 oral ETT, BBS=, +ETCO2, VSS, tube secured.

## 2016-03-26 NOTE — Evaluation (Signed)
Physical Therapy Evaluation Patient Details Name: Phillip Turner MRN: [id number] DOB: Nov 23, 1960 Today's Date: 03/26/2016   History of Present Illness  55 y.o. male admitted to [facility] on 03/26/16 for elective R THA.  Pt with significant PMHx of stroke, sickle cell anemia, HTN, gout, DM, and L THA.    Clinical Impression  Pt is POD #0 and is moving well, min guard assist with RW a short distance around the room.  He will likely progress well enough to d/c home with family's assist and HHPT at discharge.   PT to follow acutely for deficits listed below.     Follow Up Recommendations Home health PT;Supervision for mobility/OOB    Equipment Recommendations  None recommended by PT    Recommendations for Other Services   NA    Precautions / Restrictions Precautions Precautions: None Restrictions Weight Bearing Restrictions: Yes RLE Weight Bearing: Weight bearing as tolerated      Mobility  Bed Mobility Overal bed mobility: Needs Assistance Bed Mobility: Supine to Sit     Supine to sit: Modified independent (Device/Increase time);HOB elevated     General bed mobility comments: Pt using both hands to help progress left leg to EOB with HOB elevated, minimal use of railings.   Transfers Overall transfer level: Needs assistance Equipment used: Rolling walker (2 wheeled) Transfers: Sit to/from Stand Sit to Stand: Min guard         General transfer comment: Min guard assist for safety during transitions.  Verbal cues for safe hand placement reminders  Ambulation/Gait Ambulation/Gait assistance: Min guard Ambulation Distance (Feet): 25 Feet Assistive device: Rolling walker (2 wheeled) Gait Pattern/deviations: Step-through pattern;Antalgic Gait velocity: decreased Gait velocity interpretation: Below normal speed for age/gender General Gait Details: Moderately antalgic gait pattern with verbal cues for safety and pt demonstrated correct LE sequencing.          Balance  Overall balance assessment: Needs assistance Sitting-balance support: Feet supported;No upper extremity supported Sitting balance-Leahy Scale: Good     Standing balance support: Bilateral upper extremity supported Standing balance-Leahy Scale: Poor                               Pertinent Vitals/Pain Pain Assessment: 0-10 Pain Score: 4  Pain Location: right hip Pain Descriptors / Indicators: Grimacing;Guarding Pain Intervention(s): Limited activity within patient's tolerance;Monitored during session;Repositioned    Home Living Family/patient expects to be discharged to:: Private residence Living Arrangements: Spouse/significant other Available Help at Discharge: Family;Available 24 hours/day Type of Home: House Home Access: Stairs to enter Entrance Stairs-Rails: Right Entrance Stairs-Number of Steps: 4 Home Layout: One level Home Equipment: Cane - single point;Tub bench;Walker - 2 wheels;Bedside commode      Prior Function Level of Independence: Independent with assistive device(s)         Comments: was using RW his hip got so bad PTA.       Hand Dominance   Dominant Hand: Right    Extremity/Trunk Assessment   Upper Extremity Assessment: Defer to OT evaluation           Lower Extremity Assessment: RLE deficits/detail RLE Deficits / Details: right leg with normal post op pain and weakness, ankle at least 3/5, knee 3/5, hip 3-/5    Cervical / Trunk Assessment: Normal  Communication   Communication: No difficulties  Cognition Arousal/Alertness: Awake/alert Behavior During Therapy: WFL for tasks assessed/performed Overall Cognitive Status: Within Functional Limits for tasks assessed  General Comments General comments (skin integrity, edema, etc.): Mildly lightheaded EOB, but able to continue    Exercises Total Joint Exercises Ankle Circles/Pumps: AROM;Both;20 reps Quad Sets: AROM;Both;10 reps Heel Slides:  AROM;Right;10 reps   Assessment/Plan    PT Assessment Patient needs continued PT services  PT Problem List Decreased strength;Decreased range of motion;Decreased activity tolerance;Decreased balance;Decreased mobility;Decreased knowledge of use of DME;Pain;Obesity          PT Treatment Interventions DME instruction;Gait training;Stair training;Functional mobility training;Therapeutic activities;Therapeutic exercise;Balance training;Patient/family education;Modalities;Manual techniques    PT Goals (Current goals can be found in the Care Plan section)  Acute Rehab PT Goals Patient Stated Goal: to get back to taking care of his wife PT Goal Formulation: With patient Time For Goal Achievement: 04/02/16 Potential to Achieve Goals: Good    Frequency 7X/week           End of Session   Activity Tolerance: Patient tolerated treatment well Patient left: in chair;with call bell/phone within reach Nurse Communication: Mobility status         Time: [id number] PT Time Calculation (min) (ACUTE ONLY): 28 min   Charges:   PT Evaluation $PT Eval Moderate Complexity: 1 Procedure PT Treatments $Gait Training: 8-22 mins        Everley Evora B. Oliverio Cho, PT, DPT [phone]   03/26/2016, 5:55 PM

## 2016-03-26 NOTE — Transfer of Care (Signed)
Immediate Anesthesia Transfer of Care Note  Patient: Phillip Turner  Procedure(s) Performed: Procedure(s): RIGHT TOTAL HIP ARTHROPLASTY ANTERIOR APPROACH (Right)  Patient Location: PACU  Anesthesia Type:General  Level of Consciousness: awake, alert , oriented and patient cooperative  Airway & Oxygen Therapy: Patient Spontanous Breathing and Patient connected to face mask oxygen  Post-op Assessment: Report given to RN and Post -op Vital signs reviewed and stable  Post vital signs: Reviewed and stable  Last Vitals:  Vitals:   03/26/16 0627  BP: (!) 166/86  Pulse: (!) 59  Resp: 20  Temp: 36.8 C    Last Pain:  Vitals:   03/26/16 0627  TempSrc: Oral  PainSc: 9       Patients Stated Pain Goal: 4 ([id number])  Complications: No apparent anesthesia complications

## 2016-03-27 ENCOUNTER — Encounter (HOSPITAL_COMMUNITY): Payer: Self-pay | Admitting: Orthopedic Surgery

## 2016-03-27 LAB — GLUCOSE, CAPILLARY
GLUCOSE-CAPILLARY: 138 mg/dL — AB (ref 65–99)
GLUCOSE-CAPILLARY: 124 mg/dL — AB (ref 65–99)
GLUCOSE-CAPILLARY: 142 mg/dL — AB (ref 65–99)
Glucose-Capillary: 102 mg/dL — ABNORMAL HIGH (ref 65–99)
Glucose-Capillary: 250 mg/dL — ABNORMAL HIGH (ref 65–99)

## 2016-03-27 NOTE — Discharge Summary (Signed)
Discharge Summary  Patient ID: Phillip Turner MRN: [id number] DOB/AGE: 11/09/1960 55 y.o.  Admit date: 03/26/2016 Discharge date: 03/27/2016  Admission Diagnoses:  Primary osteoarthritis of right hip  Discharge Diagnoses:  Principal Problem:   Primary osteoarthritis of right hip Active Problems:   Depression   Memory impairment   DM (diabetes mellitus), type 2, uncontrolled with complications ([place])   Hypothyroidism   Anxiety disorder   Past Medical History:  Diagnosis Date  . Anxiety    from past drug use not longer having anxiety  . Arthritis   . Depression    past depression relating to drug use   . Diabetes mellitus   . Gout   . Hyperlipidemia   . Hypertension   . Hypothyroidism   . Rheumatic fever    as a teenager  . 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 Stroke [facility])    does not remember when, weakness on right side, some trouble speaking at times  . Substance abuse    former cocaine user- not since March 2014    Surgeries: Procedure(s): RIGHT TOTAL HIP ARTHROPLASTY ANTERIOR APPROACH on 03/26/2016   Consultants (if any):   Discharged Condition: Improved  Progress  Subjective: Feeling good.  OOB.  Pain controlled.  Tolerating diet.  Urinating.  No CP, SOB.  Objective: General: NAD.  Upright in bed. Resp: No increased WOB Cardio: regular rate and rhythm ABD protuberant, soft Neurologically intact MSK Right leg:  Neurovascularly intact Sensation intact distally Dorsiflexion/Plantar flexion intact Incision: dressing C/D/I  Plan: Up with therapy  Incentive spirometry. Anterior-lateral hip precautions  Weight Bearing: Weight Bearing as Tolerated (WBAT)  Dressings: Mepilex - please provide additional dressings on d/c.  VTE prophylaxis: Aspirin, ambulation, SCDs Dispo: Home likely today after PT.  Hospital Course: Phillip Turner is an 55 y.o. male who was admitted 03/26/2016 with a diagnosis of Primary osteoarthritis of right hip  and went to the operating room on 03/26/2016 and underwent the above named procedures.    He was given perioperative antibiotics:  Anti-infectives    Start     Dose/Rate Route Frequency Ordered Stop   03/26/16 1600  ceFAZolin (ANCEF) IVPB 2g/100 mL premix     2 g 200 mL/hr over 30 Minutes Intravenous Every 6 hours 03/26/16 1422 03/26/16 2230   03/26/16 0700  ceFAZolin (ANCEF) 3 g in dextrose 5 % 50 mL IVPB     3 g 130 mL/hr over 30 Minutes Intravenous To [facility] 03/25/16 1438 03/26/16 0802    .  He was given sequential compression devices, early ambulation, and Aspirin for DVT prophylaxis.  He benefited maximally from the hospital stay and there were no complications.    Recent vital signs:  Vitals:   03/27/16 0025 03/27/16 0426  BP: 126/60 (!) 146/50  Pulse: 68 80  Resp: 16 16  Temp: 99.8 F (37.7 C) (!) 100.7 F (38.2 C)    Discharge Medications:     Medication List    STOP taking these medications   aspirin 325 MG tablet Replaced by:  aspirin EC 325 MG tablet     TAKE these medications   amLODipine 10 MG tablet Commonly known as:  NORVASC TAKE 1 TABLET BY MOUTH EVERY DAY FOR BLOOD PRESSURE   aspirin EC 325 MG tablet Take 1 tablet (325 mg total) by mouth daily. For 30 days postop for DVT prophylaxis.  Resume home dose when complete. Replaces:  aspirin 325 MG tablet   colchicine  0.6 MG tablet TAKE 1 TABLET BY MOUTH TWICE DAILY FOR GOUT FLARE-UPS   docusate sodium 100 MG capsule Commonly known as:  COLACE Take 1 capsule (100 mg total) by mouth 2 (two) times daily.   EDARBYCLOR 40-25 MG Tabs Generic drug:  Azilsartan-Chlorthalidone Take 1 tablet by mouth daily. Will stop 1 week prior to surgery   empagliflozin 10 MG Tabs tablet Commonly known as:  JARDIANCE Take 25 mg by mouth daily.   glipiZIDE 5 MG tablet Commonly known as:  GLUCOTROL Take 5 mg by mouth 2 (two) times daily before a meal.   HYDROcodone-acetaminophen 5-325 MG  tablet Commonly known as:  NORCO/VICODIN Take 1 tablet by mouth 2 (two) times daily as needed for severe pain.   levothyroxine 100 MCG tablet Commonly known as:  SYNTHROID, LEVOTHROID Take 100 mcg by mouth daily.   LINZESS 145 MCG Caps capsule Generic drug:  linaclotide Take 1 tablet by mouth daily.   methocarbamol 500 MG tablet Commonly known as:  ROBAXIN Take 1 tablet (500 mg total) by mouth every 6 (six) hours as needed for muscle spasms. What changed:  when to take this  reasons to take this   metoprolol succinate 25 MG 24 hr tablet Commonly known as:  TOPROL-XL Take 25 mg by mouth daily.   omeprazole 20 MG capsule Commonly known as:  PRILOSEC Take 1 capsule (20 mg total) by mouth daily. While taking anti inflammatory medicine daily   ondansetron 4 MG tablet Commonly known as:  ZOFRAN Take 1 tablet (4 mg total) by mouth every 8 (eight) hours as needed for nausea or vomiting.   oxyCODONE-acetaminophen 5-325 MG tablet Commonly known as:  ROXICET Take 1-2 tablets by mouth every 4 (four) hours as needed for severe pain.   potassium chloride SA 20 MEQ tablet Commonly known as:  K-DUR,KLOR-CON Take 2 tablets (40 mEq total) by mouth 2 (two) times daily. What changed:  how much to take  when to take this  additional instructions   sildenafil 100 MG tablet Commonly known as:  VIAGRA Take 100 mg by mouth as needed for erectile dysfunction.   simvastatin 10 MG tablet Commonly known as:  ZOCOR Take 1 tablet by mouth daily.   traZODone 100 MG tablet Commonly known as:  DESYREL TAKE 2 TABLETS BY MOUTH EVERY NIGHT AT BEDTIME FOR SLEEP/DEPRESSION       Diagnostic Studies: Dg C-arm 1-60 Min  Result Date: 03/26/2016 CLINICAL DATA:  Right hip arthroplasty. EXAM: OPERATIVE RIGHT HIP (WITH PELVIS IF PERFORMED) 2 VIEWS TECHNIQUE: Fluoroscopic spot image(s) were submitted for interpretation post-operatively. COMPARISON:  Pelvis and left hip radiographs 12/26/2015  FINDINGS: Two intraoperative spot fluoroscopic images of the right hip and lower pelvis are provided. These demonstrate interval performance of a right total hip arthroplasty, with the femoral and acetabular components normally located in this projection. A left hip arthroplasty is partially visualized. IMPRESSION: Intraoperative images during right hip arthroplasty. Electronically Signed   By: Logan Bores M.D.   On: 03/26/2016 10:31   Dg Hip Operative Unilat W Or W/o Pelvis Right  Result Date: 03/26/2016 CLINICAL DATA:  Right hip arthroplasty. EXAM: OPERATIVE RIGHT HIP (WITH PELVIS IF PERFORMED) 2 VIEWS TECHNIQUE: Fluoroscopic spot image(s) were submitted for interpretation post-operatively. COMPARISON:  Pelvis and left hip radiographs 12/26/2015 FINDINGS: Two intraoperative spot fluoroscopic images of the right hip and lower pelvis are provided. These demonstrate interval performance of a right total hip arthroplasty, with the femoral and acetabular components normally located in this projection.  A left hip arthroplasty is partially visualized. IMPRESSION: Intraoperative images during right hip arthroplasty. Electronically Signed   By: Logan Bores M.D.   On: 03/26/2016 10:31    Disposition: 01-Home or Self Care    Follow-up Information    MURPHY, TIMOTHY D, MD Follow up in 2 week(s).   Specialty:  Orthopedic Surgery Contact information: [address] [place] [address] [phone]            Signed: Prudencio Burly III PA-C 03/27/2016, 7:15 AM

## 2016-03-27 NOTE — Care Management Note (Signed)
Case Management Note  Patient Details  Name: Phillip Turner MRN: [id number] Date of Birth: 1960/11/30  Subjective/Objective:     Right THA               Action/Plan: Discharge Planning: AVS reviewed:  NCM spoke to pt and offered choice for [facility]. Pt preoperatively arranged with [facility] PT. Pt states he has RW and 3n1 at home. Wife at home to assist with care.    Expected Discharge Date:  03/27/2016             Expected Discharge Plan:  [place]  In-House Referral:  NA  Discharge planning Services  CM Consult  Post Acute Care Choice:  Home Health Choice offered to:  Patient  DME Arranged:  N/A DME Agency:  NA  HH Arranged:  PT [place] Agency:  [facility] (formerly [employer])  Status of Service:  Completed, signed off  If discussed at [employer], dates discussed:    Additional Comments:  Erenest Rasher, RN 03/27/2016, 11:02 AM

## 2016-03-27 NOTE — Evaluation (Signed)
Occupational Therapy Evaluation and Discharge Patient Details Name: Phillip Turner MRN: [id number] DOB: 12/08/1960 Today's Date: 03/27/2016    History of Present Illness 55 y.o. male admitted to [facility] on 03/26/16 for elective R THA.  Pt with significant PMHx of stroke, sickle cell anemia, HTN, gout, DM, and L THA.     Clinical Impression   PTA Pt independent in ADL and using RW for ambulation due to pain. Pt is currently min A for ADL and min guard for mobility with RW. Pt familiar with compensatory strategies, energy conservation, and safety strategies due to recent surgery. Pt fully assessed and received all education. Pt and family with no further questions or concerns from OT perspective. OT to sign off. Thank you for this referral.     Follow Up Recommendations  No OT follow up;Supervision - Intermittent    Equipment Recommendations  None recommended by OT    Recommendations for Other Services       Precautions / Restrictions Precautions Precautions: None Restrictions Weight Bearing Restrictions: Yes RLE Weight Bearing: Weight bearing as tolerated      Mobility Bed Mobility Overal bed mobility: Needs Assistance Bed Mobility: Supine to Sit     Supine to sit: Modified independent (Device/Increase time);HOB elevated     General bed mobility comments: increased time required  Transfers Overall transfer level: Needs assistance Equipment used: Rolling walker (2 wheeled) Transfers: Sit to/from Stand Sit to Stand: Min guard         General transfer comment: vc for safe hand placement    Balance Overall balance assessment: Needs assistance Sitting-balance support: No upper extremity supported;Feet supported Sitting balance-Leahy Scale: Good Sitting balance - Comments: EOB and recliner   Standing balance support: No upper extremity supported;During functional activity Standing balance-Leahy Scale: Fair Standing balance comment: sink level ADL                             ADL Overall ADL's : Needs assistance/impaired Eating/Feeding: Set up;Sitting Eating/Feeding Details (indicate cue type and reason): eating breakfast when OT attempted in the am Grooming: Wash/dry hands;Min guard;Standing Grooming Details (indicate cue type and reason): sink level Upper Body Bathing: Min guard;Sitting Upper Body Bathing Details (indicate cue type and reason): Pt educated on use of 3 in 1 as shower chair.  Lower Body Bathing: Min guard;With caregiver independent assisting;Sitting/lateral leans Lower Body Bathing Details (indicate cue type and reason): Pt educated in use of long handle sponge Upper Body Dressing : Set up;Sitting   Lower Body Dressing: Minimal assistance;Sit to/from stand;With caregiver independent assisting   Toilet Transfer: Supervision/safety;Comfort height toilet;Grab bars;RW   Toileting- Clothing Manipulation and Hygiene: Supervision/safety;Sit to/from stand       Functional mobility during ADLs: Min guard;Rolling walker General ADL Comments: Pt is familiar with compensatory strategies, energy conservation, and safety from previous surgery. Reviewed with Pt in full     Vision Vision Assessment?: No apparent visual deficits   Perception     Praxis      Pertinent Vitals/Pain Pain Assessment: 0-10 Pain Score: 8  Pain Location: R hip Pain Descriptors / Indicators: Aching;Burning;Grimacing;Guarding Pain Intervention(s): Limited activity within patient's tolerance;Monitored during session;Repositioned     Hand Dominance Right   Extremity/Trunk Assessment Upper Extremity Assessment Upper Extremity Assessment: Overall WFL for tasks assessed   Lower Extremity Assessment Lower Extremity Assessment: RLE deficits/detail;Defer to PT evaluation RLE Deficits / Details:  (post-op decreased ROM and strength) RLE: Unable to fully  assess due to pain   Cervical / Trunk Assessment Cervical / Trunk Assessment: Normal    Communication Communication Communication: No difficulties   Cognition Arousal/Alertness: Awake/alert Behavior During Therapy: WFL for tasks assessed/performed Overall Cognitive Status: Within Functional Limits for tasks assessed                     General Comments       Exercises Exercises: Total Joint     Shoulder Instructions      Home Living Family/patient expects to be discharged to:: Private residence Living Arrangements: Spouse/significant other Available Help at Discharge: Family;Available 24 hours/day Type of Home: House Home Access: Stairs to enter [employer] of Steps: 4 Entrance Stairs-Rails: Right Home Layout: One level     Bathroom Shower/Tub: Tub/shower unit Shower/tub characteristics: Curtain Biochemist, clinical: Handicapped height Bathroom Accessibility: No   Home Equipment: [place] - single point;Tub bench;Walker - 2 wheels;Bedside commode          Prior Functioning/Environment Level of Independence: Independent with assistive device(s)        Comments: was using RW his hip got so bad PTA.          OT Problem List: Decreased strength;Decreased activity tolerance;Decreased range of motion;Impaired balance (sitting and/or standing);Decreased knowledge of use of DME or AE;Obesity;Pain   OT Treatment/Interventions:      OT Goals(Current goals can be found in the care plan section) Acute Rehab OT Goals Patient Stated Goal: to get back to taking care of his wife OT Goal Formulation: With patient Time For Goal Achievement: 04/03/16 Potential to Achieve Goals: Good  OT Frequency:     Barriers to D/C:            Co-evaluation              End of Session Equipment Utilized During Treatment: Rolling walker Nurse Communication: Mobility status  Activity Tolerance: Patient tolerated treatment well Patient left: in chair;with call bell/phone within reach;with family/visitor present   Time: [id number] OT Time Calculation  (min): 15 min Charges:  OT General Charges $OT Visit: 1 Procedure OT Evaluation $OT Eval Moderate Complexity: 1 Procedure G-Codes:    [place] Shivon Hackel 2016-04-25, 4:25 PM  Hulda Humphrey OTR/L [phone]

## 2016-03-27 NOTE — Progress Notes (Signed)
PT Cancellation Note  Patient Details Name: Phillip Turner MRN: [id number] DOB: [birth year]   Cancelled Treatment:    Reason Eval/Treat Not Completed: Fatigue/lethargy limiting ability to participate;Pain limiting ability to participate.  Pt had a rough night and did not sleep.  He requested to rest until this afternoon.  PT spoke with RN who agrees with this plan.  PT will f/u this PM at ~1500.    Thanks,   Barbarann Ehlers. Noheli Melder, PT, DPT [phone]   03/27/2016, 10:17 AM

## 2016-03-27 NOTE — Progress Notes (Signed)
Physical Therapy Treatment Patient Details Name: Phillip Turner MRN: [id number] DOB: 06/03/1960 Today's Date: 03/27/2016    History of Present Illness 55 y.o. male admitted to [facility] on 03/26/16 for elective R THA.  Pt with significant PMHx of stroke, sickle cell anemia, HTN, gout, DM, and L THA.      PT Comments    Pt reports he doesn't feel much more rested this PM, but is willing to get up and do his therapy.  We ambulated further into the hallway and preformed his standing exercises with RW and min guard assist overall.  PT will continue to follow acutely and pt will need to do stair training prior to d/c in AM.   Follow Up Recommendations  Home health PT;Supervision for mobility/OOB     Equipment Recommendations  None recommended by PT    Recommendations for Other Services   NA     Precautions / Restrictions Precautions Precautions: None Restrictions Weight Bearing Restrictions: Yes RLE Weight Bearing: Weight bearing as tolerated    Mobility  Bed Mobility               General bed mobility comments: Pt OOB in chair  Transfers Overall transfer level: Needs assistance Equipment used: Rolling walker (2 wheeled) Transfers: Sit to/from Stand Sit to Stand: Min guard         General transfer comment: Min guard assist for safety during transitions, verbal cues for safe RW use and safe hand placement.   Ambulation/Gait Ambulation/Gait assistance: Min guard Ambulation Distance (Feet): 75 Feet Assistive device: Rolling walker (2 wheeled) Gait Pattern/deviations: Step-to pattern;Antalgic Gait velocity: decreased Gait velocity interpretation: Below normal speed for age/gender General Gait Details: Moderately antalgic gait pattern that improved with increased distance, verbal cues to not push RW so far away during gait.  Min guard assist for safety       Balance Overall balance assessment: Needs assistance Sitting-balance support: Feet supported;No upper  extremity supported Sitting balance-Leahy Scale: Good     Standing balance support: Bilateral upper extremity supported;No upper extremity supported;Single extremity supported Standing balance-Leahy Scale: Fair                      Cognition Arousal/Alertness: Awake/alert Behavior During Therapy: WFL for tasks assessed/performed Overall Cognitive Status: Within Functional Limits for tasks assessed                      Exercises Total Joint Exercises Hip ABduction/ADduction: AROM;Right;10 reps;Standing Long Arc Quad: AROM;Right;10 reps Knee Flexion: AROM;Right;10 reps;Standing Marching in Standing: AROM;Right;10 reps;Standing Standing Hip Extension: AROM;Right;10 reps;Standing        Pertinent Vitals/Pain Pain Assessment: 0-10 Pain Score: 8  Pain Location: right hip Pain Descriptors / Indicators: Aching;Burning;Grimacing;Guarding Pain Intervention(s): Limited activity within patient's tolerance;Monitored during session;Repositioned           PT Goals (current goals can now be found in the care plan section) Acute Rehab PT Goals Patient Stated Goal: to get back to taking care of his wife Progress towards PT goals: Progressing toward goals    Frequency    7X/week      PT Plan Current plan remains appropriate       End of Session   Activity Tolerance: Patient limited by pain;Patient limited by fatigue Patient left: in chair;with call bell/phone within reach;with family/visitor present     Time: [id number] PT Time Calculation (min) (ACUTE ONLY): 19 min  Charges:  $Gait Training: 8-22 mins  Barbarann Ehlers Paulette [place], PT, DPT [phone]   03/27/2016, 4:08 PM

## 2016-03-27 NOTE — Progress Notes (Signed)
OT Cancellation Note  Patient Details Name: Phillip Turner MRN: [id number] DOB: 11-08-1960   Cancelled Treatment:    Reason Eval/Treat Not Completed: Fatigue/lethargy limiting ability to participate;Pain limiting ability to participate. 2nd attempt this morning to evaluate Pt. 1st atttempt at 8:15. Pt did not sleep last night and is in significant pain. Pt politely requests that therapy see him later in the day (after lunch). OT will check back as able.   [place] 03/27/2016, 11:01 AM  Hulda Humphrey OTR/L [phone]

## 2016-03-28 LAB — GLUCOSE, CAPILLARY
GLUCOSE-CAPILLARY: 130 mg/dL — AB (ref 65–99)
GLUCOSE-CAPILLARY: 138 mg/dL — AB (ref 65–99)
GLUCOSE-CAPILLARY: 99 mg/dL (ref 65–99)
Glucose-Capillary: 79 mg/dL (ref 65–99)

## 2016-03-28 MED ORDER — AMOXICILLIN 500 MG PO CAPS: ORAL_CAPSULE | ORAL | 0 refills | Status: DC

## 2016-03-28 NOTE — Progress Notes (Signed)
Subjective: 2 Days Post-Op Procedure(s) (LRB): RIGHT TOTAL HIP ARTHROPLASTY ANTERIOR APPROACH (Right) Patient reports pain as mild.  No nausea/vomiting, lightheadedness/dizziness, chest pain/sob.  Objective: Vital signs in last 24 hours: Temp:  [98.2 F (36.8 C)-100.6 F (38.1 C)] 98.2 F (36.8 C) (11/23 0416) Pulse Rate:  [61-69] 61 (11/23 0416) BP: (134-156)/(57-77) 134/58 (11/23 0416) SpO2:  [95 %-98 %] 96 % (11/23 0416)  Intake/Output from previous day: 11/22 0701 - 11/23 0700 In: 360 [P.O.:360] Out: 1400 [Urine:1400] Intake/Output this shift: Total I/O In: 360 [P.O.:360] Out: 300 [Urine:300]  No results for input(s): HGB in the last 72 hours. No results for input(s): WBC, RBC, HCT, PLT in the last 72 hours. No results for input(s): NA, K, CL, CO2, BUN, CREATININE, GLUCOSE, CALCIUM in the last 72 hours. No results for input(s): LABPT, INR in the last 72 hours.  Neurologically intact Neurovascular intact Sensation intact distally Intact pulses distally Dorsiflexion/Plantar flexion intact Incision: dressing C/D/I No cellulitis present Compartment soft  Dressing changed by me today  Assessment/Plan: 2 Days Post-Op Procedure(s) (LRB): RIGHT TOTAL HIP ARTHROPLASTY ANTERIOR APPROACH (Right) Advance diet Up with therapy Discharge home with home health today WBAT RLE-anterior hip precautions Dry dressing change prn  Fannie Knee 03/28/2016, 9:43 AM

## 2016-03-28 NOTE — Progress Notes (Signed)
Physical Therapy Treatment Patient Details Name: Phillip Turner MRN: [id number] DOB: November 23, 1960 Today's Date: 03/28/2016    History of Present Illness 55 y.o. male admitted to [facility] on 03/26/16 for elective R THA.  Pt with significant PMHx of stroke, sickle cell anemia, HTN, gout, DM, and L THA.      PT Comments    The patient is making excellent progress toward his goals.  He ambulated further today and tolerated stair training and standing exercises within his Rw.  Pt will benefit from HHPT to increase his ROM, strength, and endurance.  Will notify PT that pt is safe for discharge.  Follow Up Recommendations  Home health PT;Supervision for mobility/OOB     Equipment Recommendations  None recommended by PT    Recommendations for Other Services       Precautions / Restrictions Precautions Precautions: None Restrictions Weight Bearing Restrictions: Yes RLE Weight Bearing: Weight bearing as tolerated    Mobility  Bed Mobility Overal bed mobility: Modified Independent Bed Mobility: Supine to Sit;Sit to Supine     Supine to sit: Modified independent (Device/Increase time);HOB elevated Sit to supine: Modified independent (Device/Increase time)   General bed mobility comments: Increased time due to pain.  Transfers Overall transfer level: Needs assistance Equipment used: Rolling walker (2 wheeled) Transfers: Sit to/from Stand Sit to Stand: Supervision         General transfer comment: Verbal cues for safe hand placement.  Ambulation/Gait Ambulation/Gait assistance: Supervision Ambulation Distance (Feet): 100 Feet Assistive device: Rolling walker (2 wheeled) Gait Pattern/deviations: Step-through pattern;Decreased stride length;Antalgic Gait velocity: decreased Gait velocity interpretation: Below normal speed for age/gender General Gait Details: Slow, antalgic gait but speed increased with time.   Stairs Stairs: Yes Stairs assistance: Supervision Stair  Management: One rail Right;Step to pattern;Forwards;With cane Number of Stairs: 4 General stair comments: Pt required verbal cues for correct sequence and technique.  Cues for hand placement as well.  Wheelchair Mobility    Modified Rankin (Stroke Patients Only)       Balance     Sitting balance-Leahy Scale: Good       Standing balance-Leahy Scale: Fair                      Cognition Arousal/Alertness: Awake/alert Behavior During Therapy: WFL for tasks assessed/performed Overall Cognitive Status: Within Functional Limits for tasks assessed                      Exercises Total Joint Exercises Hip ABduction/ADduction: AROM;Right;10 reps;Standing Knee Flexion: AROM;Right;10 reps;Standing Marching in Standing: AROM;Right;10 reps;Standing Bridges: AROM;Right;Standing;10 reps    General Comments        Pertinent Vitals/Pain Pain Assessment: 0-10 Pain Score: 7  Pain Location: R hip muscles Pain Descriptors / Indicators: Grimacing;Guarding Pain Intervention(s): Limited activity within patient's tolerance;Monitored during session    Home Living                      Prior Function            PT Goals (current goals can now be found in the care plan section) Acute Rehab PT Goals Patient Stated Goal: to get back to taking care of his wife PT Goal Formulation: With patient Time For Goal Achievement: 04/02/16 Potential to Achieve Goals: Good Progress towards PT goals: Progressing toward goals    Frequency    7X/week      PT Plan Current plan remains appropriate  Co-evaluation             End of Session Equipment Utilized During Treatment:  (Pt refused gait belt.) Activity Tolerance: Patient tolerated treatment well Patient left: in bed;with call bell/phone within reach     Time: 0906-0921 PT Time Calculation (min) (ACUTE ONLY): 15 min  Charges:  $Gait Training: 8-22 mins                    G Codes:      Phillip Turner 04-13-16, 9:49 AM  Phillip Turner. [place], [place]

## 2016-05-04 ENCOUNTER — Encounter (HOSPITAL_COMMUNITY): Payer: Self-pay | Admitting: Emergency Medicine

## 2016-05-04 ENCOUNTER — Emergency Department (HOSPITAL_COMMUNITY)
Admission: EM | Admit: 2016-05-04 | Discharge: 2016-05-04 | Disposition: A | Discharge status: Home / Self Care | Payer: Medicare HMO | Attending: Emergency Medicine | Admitting: Emergency Medicine

## 2016-05-04 DIAGNOSIS — E039 Hypothyroidism, unspecified: Secondary | ICD-10-CM | POA: Diagnosis not present

## 2016-05-04 DIAGNOSIS — K047 Periapical abscess without sinus: Secondary | ICD-10-CM | POA: Diagnosis not present

## 2016-05-04 DIAGNOSIS — E119 Type 2 diabetes mellitus without complications: Secondary | ICD-10-CM | POA: Insufficient documentation

## 2016-05-04 DIAGNOSIS — Z8673 Personal history of transient ischemic attack (TIA), and cerebral infarction without residual deficits: Secondary | ICD-10-CM | POA: Insufficient documentation

## 2016-05-04 DIAGNOSIS — Z7982 Long term (current) use of aspirin: Secondary | ICD-10-CM | POA: Insufficient documentation

## 2016-05-04 DIAGNOSIS — Z87891 Personal history of nicotine dependence: Secondary | ICD-10-CM | POA: Diagnosis not present

## 2016-05-04 DIAGNOSIS — Z96643 Presence of artificial hip joint, bilateral: Secondary | ICD-10-CM | POA: Diagnosis not present

## 2016-05-04 DIAGNOSIS — I10 Essential (primary) hypertension: Secondary | ICD-10-CM | POA: Diagnosis not present

## 2016-05-04 DIAGNOSIS — K0889 Other specified disorders of teeth and supporting structures: Secondary | ICD-10-CM | POA: Diagnosis present

## 2016-05-04 MED ORDER — AMOXICILLIN 500 MG PO CAPS
500.0000 mg | ORAL_CAPSULE | Freq: Three times a day (TID) | ORAL | 0 refills | Status: DC
Start: 2016-05-04 — End: 2017-11-28

## 2016-05-04 NOTE — ED Triage Notes (Signed)
Patient reports right upper and lower molar pain x2 days. Denies fevers.

## 2016-05-04 NOTE — ED Provider Notes (Signed)
[facility] DEPT Provider Note   CSN: [id number] Arrival date & time: 05/04/16  1041  By signing my name below, I, Higinio Plan, attest that this documentation has been prepared under the direction and in the presence of Quincy Carnes, PA-C.  Electronically Signed: Higinio Plan, ED Scribe. 05/04/16. 11:29 AM.  History   Chief Complaint Chief Complaint  Patient presents with  . Dental Pain   The history is provided by the patient. No language interpreter was used.   HPI Comments: Phillip Turner is a 55 y.o. male with PMHx of DM, HTN and HLD, who presents to the Emergency Department complaining of gradually worsening, right upper and lower dental pain that began 3 days ago. Pt reports his pain is worse at night with associated difficulty sleeping due to his pain. He notes he visited the ED on 03/05/16 for similar pain and was prescribed penicillin and given a referral for a dentist but states he lost these documents. He denies fever.   Past Medical History:  Diagnosis Date  . Anxiety    from past drug use not longer having anxiety  . Arthritis   . Depression    past depression relating to drug use   . Diabetes mellitus   . Gout   . Hyperlipidemia   . Hypertension   . Hypothyroidism   . Rheumatic fever    as a teenager  . 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 Stroke [facility])    does not remember when, weakness on right side, some trouble speaking at times  . Substance abuse    former cocaine user- not since March 2014    Patient Active Problem List   Diagnosis Date Noted  . Primary osteoarthritis of right hip 03/04/2016  . Primary osteoarthritis of left hip 10/17/2015  . CVA (cerebral infarction) 09/08/2013  . HTN (hypertension) 03/12/2012  . Major depression, recurrent ([place]) 03/10/2012  . Anxiety disorder 03/10/2012  . Leukocytosis 03/08/2012  . Hyponatremia 03/08/2012  . DM (diabetes mellitus), type 2, uncontrolled with complications ([place]) 99991111  .  Hypothyroidism 03/29/2011  . Depression 03/27/2011  . H/O 03/27/2011  . Memory impairment 03/27/2011  . Alcohol dependence ([place]) 03/26/2011  . 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 Cocaine abuse 03/26/2011    Past Surgical History:  Procedure Laterality Date  . boil removed from tailbone     . COLONOSCOPY    . TONSILLECTOMY    . TOTAL HIP ARTHROPLASTY Left 11/14/2015   Procedure: LEFT TOTAL HIP ARTHROPLASTY ANTERIOR APPROACH;  Surgeon: Renette Butters, MD;  Location: [place];  Service: Orthopedics;  Laterality: Left;  . TOTAL HIP ARTHROPLASTY Right 03/26/2016   Procedure: RIGHT TOTAL HIP ARTHROPLASTY ANTERIOR APPROACH;  Surgeon: Renette Butters, MD;  Location: [place];  Service: Orthopedics;  Laterality: Right;    Home Medications    Prior to Admission medications   Medication Sig Start Date End Date Taking? Authorizing Provider  amLODipine (NORVASC) 10 MG tablet TAKE 1 TABLET BY MOUTH EVERY DAY FOR BLOOD PRESSURE 06/06/14   Cassandria Anger, MD  amoxicillin (AMOXIL) 500 MG capsule Take 4 tabs one hour prior to dental procedure 03/28/16   Aundra Dubin, PA-C  aspirin EC 325 MG tablet Take 1 tablet (325 mg total) by mouth daily. For 30 days postop for DVT prophylaxis.  Resume home dose when complete. 03/26/16   Charna Elizabeth Martensen III, PA-C  colchicine 0.6 MG tablet TAKE 1 TABLET BY MOUTH TWICE DAILY FOR  GOUT FLARE-UPS 09/12/14   Evie Lacks Plotnikov, MD  docusate sodium (COLACE) 100 MG capsule Take 1 capsule (100 mg total) by mouth 2 (two) times daily. 03/26/16   Charna Elizabeth Martensen III, PA-C  EDARBYCLOR 40-25 MG TABS Take 1 tablet by mouth daily. Will stop 1 week prior to surgery 09/04/15   Historical Provider, MD  empagliflozin (JARDIANCE) 10 MG TABS tablet Take 25 mg by mouth daily.     Historical Provider, MD  glipiZIDE (GLUCOTROL) 5 MG tablet Take 5 mg by mouth 2 (two) times daily before a meal.     Historical Provider, MD  HYDROcodone-acetaminophen (NORCO/VICODIN)  5-325 MG tablet Take 1 tablet by mouth 2 (two) times daily as needed for severe pain. 12/27/15   Everlene Balls, MD  levothyroxine (SYNTHROID, LEVOTHROID) 100 MCG tablet Take 100 mcg by mouth daily.  08/04/15   Historical Provider, MD  LINZESS 145 MCG CAPS capsule Take 1 tablet by mouth daily. 08/04/15   Historical Provider, MD  methocarbamol (ROBAXIN) 500 MG tablet Take 1 tablet (500 mg total) by mouth every 6 (six) hours as needed for muscle spasms. 03/26/16   Charna Elizabeth Martensen III, PA-C  metoprolol succinate (TOPROL-XL) 25 MG 24 hr tablet Take 25 mg by mouth daily.    Historical Provider, MD  omeprazole (PRILOSEC) 20 MG capsule Take 1 capsule (20 mg total) by mouth daily. While taking anti inflammatory medicine daily 03/26/16   Charna Elizabeth Martensen III, PA-C  ondansetron (ZOFRAN) 4 MG tablet Take 1 tablet (4 mg total) by mouth every 8 (eight) hours as needed for nausea or vomiting. 03/26/16   Charna Elizabeth Martensen III, PA-C  oxyCODONE-acetaminophen (ROXICET) 5-325 MG tablet Take 1-2 tablets by mouth every 4 (four) hours as needed for severe pain. 03/26/16   Charna Elizabeth Martensen III, PA-C  potassium chloride SA (K-DUR,KLOR-CON) 20 MEQ tablet Take 2 tablets (40 mEq total) by mouth 2 (two) times daily. Patient taking differently: Take 20 mEq by mouth 3 (three) times daily. Takes 1 tablet daily, can take another tablet if needed for fluid/edema 07/13/14   Cassandria Anger, MD  sildenafil (VIAGRA) 100 MG tablet Take 100 mg by mouth as needed for erectile dysfunction.    Historical Provider, MD  simvastatin (ZOCOR) 10 MG tablet Take 1 tablet by mouth daily. 11/04/14   Historical Provider, MD  traZODone (DESYREL) 100 MG tablet TAKE 2 TABLETS BY MOUTH EVERY NIGHT AT BEDTIME FOR SLEEP/DEPRESSION 07/07/14   Cassandria Anger, MD    Family History Family History  Problem Relation Age of Onset  . Coronary artery disease Mother   . Diabetes type II Father   . Hypertension Father   . Colon cancer Neg Hx    . Esophageal cancer Neg Hx   . Rectal cancer Neg Hx   . Stomach cancer Neg Hx     Social History Social History  Substance Use Topics  . Smoking status: Former Smoker    Years: 5.00    Types: Cigarettes  . Smokeless tobacco: Never Used  . Alcohol use No     Allergies   Clonidine derivatives; Shellfish allergy; Naproxen; Indomethacin; Invokana [canagliflozin]; Metformin and related; and Other   Review of Systems Review of Systems  Constitutional: Negative for fever.  HENT: Positive for dental problem.   Psychiatric/Behavioral: Positive for sleep disturbance (due to pain).  All other systems reviewed and are negative.  Physical Exam Updated Vital Signs BP 168/93 (BP Location: Right Wrist)   Pulse (!) 55  Temp 97.7 F (36.5 C) (Oral)   Resp 18   SpO2 99%   Physical Exam  Constitutional: He is oriented to person, place, and time. He appears well-developed and well-nourished.  HENT:  Head: Normocephalic and atraumatic.  Mouth/Throat: Oropharynx is clear and moist.  Teeth largely in fair dentition, right upper and lower molars are broken with decay noted, surrounding gingiva swollen with extension into right inner cheek, handling secretions appropriately, no trismus, no facial or neck swelling, normal phonation without stridor  Eyes: Conjunctivae and EOM are normal. Pupils are equal, round, and reactive to light.  Neck: Normal range of motion.  Cardiovascular: Normal rate, regular rhythm and normal heart sounds.   Pulmonary/Chest: Effort normal and breath sounds normal.  Abdominal: Soft. Bowel sounds are normal.  Musculoskeletal: Normal range of motion.  Neurological: He is alert and oriented to person, place, and time.  Skin: Skin is warm and dry.  Psychiatric: He has a normal mood and affect.  Nursing note and vitals reviewed.  ED Treatments / Results  Labs (all labs ordered are listed, but only abnormal results are displayed) Labs Reviewed - No data to  display  EKG  EKG Interpretation None       Radiology No results found.  Procedures Procedures (including critical care time)  Medications Ordered in ED Medications - No data to display  DIAGNOSTIC STUDIES:  Oxygen Saturation is 99% on RA, normal by my interpretation.    COORDINATION OF CARE:  11:28 AM Discussed treatment plan with pt at bedside and pt agreed to plan.  Initial Impression / Assessment and Plan / ED Course  I have reviewed the triage vital signs and the nursing notes.  Pertinent labs & imaging results that were available during my care of the patient were reviewed by me and considered in my medical decision making (see chart for details).  Clinical Course    55 year old male here with right upper and lower dental pain for the past several days. He is afebrile and nontoxic. Right upper and lower molars are broken with decay noted. Surrounding gingiva swollen with some extension into the right inner cheek. There is no external facial or neck swelling. Handling secretions well, normal phonation without stridor. Not clinically concerning for Ludwig's angina at this time. Will start on antibiotics and refer to dentist.  Discussed plan with patient, he acknowledged understanding and agreed with plan of care.  Return precautions given for new or worsening symptoms.  Final Clinical Impressions(s) / ED Diagnoses   Final diagnoses:  Pain, dental  Dental infection    New Prescriptions New Prescriptions   AMOXICILLIN (AMOXIL) 500 MG CAPSULE    Take 1 capsule (500 mg total) by mouth 3 (three) times daily.   I personally performed the services described in this documentation, which was scribed in my presence. The recorded information has been reviewed and is accurate.   Larene Pickett, PA-C 05/04/16 [place], MD 05/07/16 [phone]

## 2016-05-04 NOTE — Discharge Instructions (Signed)
Take the prescribed medication as directed. Follow-up with dentist on call or one of the other clinics. Return to the ED for new or worsening symptoms.

## 2016-05-06 HISTORY — PX: EYE SURGERY: SHX253

## 2016-06-15 ENCOUNTER — Emergency Department (HOSPITAL_COMMUNITY)
Admission: EM | Admit: 2016-06-15 | Discharge: 2016-06-15 | Disposition: A | Discharge status: Home / Self Care | Payer: Medicare HMO | Attending: Emergency Medicine | Admitting: Emergency Medicine

## 2016-06-15 ENCOUNTER — Encounter (HOSPITAL_COMMUNITY): Payer: Self-pay | Admitting: Emergency Medicine

## 2016-06-15 DIAGNOSIS — Z79899 Other long term (current) drug therapy: Secondary | ICD-10-CM | POA: Insufficient documentation

## 2016-06-15 DIAGNOSIS — Z87891 Personal history of nicotine dependence: Secondary | ICD-10-CM | POA: Insufficient documentation

## 2016-06-15 DIAGNOSIS — Z7984 Long term (current) use of oral hypoglycemic drugs: Secondary | ICD-10-CM | POA: Diagnosis not present

## 2016-06-15 DIAGNOSIS — K029 Dental caries, unspecified: Secondary | ICD-10-CM

## 2016-06-15 DIAGNOSIS — E119 Type 2 diabetes mellitus without complications: Secondary | ICD-10-CM | POA: Insufficient documentation

## 2016-06-15 DIAGNOSIS — Z7982 Long term (current) use of aspirin: Secondary | ICD-10-CM | POA: Diagnosis not present

## 2016-06-15 DIAGNOSIS — Z8673 Personal history of transient ischemic attack (TIA), and cerebral infarction without residual deficits: Secondary | ICD-10-CM | POA: Diagnosis not present

## 2016-06-15 DIAGNOSIS — K0889 Other specified disorders of teeth and supporting structures: Secondary | ICD-10-CM | POA: Diagnosis present

## 2016-06-15 DIAGNOSIS — I10 Essential (primary) hypertension: Secondary | ICD-10-CM | POA: Insufficient documentation

## 2016-06-15 DIAGNOSIS — E039 Hypothyroidism, unspecified: Secondary | ICD-10-CM | POA: Diagnosis not present

## 2016-06-15 DIAGNOSIS — K047 Periapical abscess without sinus: Secondary | ICD-10-CM

## 2016-06-15 DIAGNOSIS — Z96643 Presence of artificial hip joint, bilateral: Secondary | ICD-10-CM | POA: Diagnosis not present

## 2016-06-15 MED ORDER — PENICILLIN V POTASSIUM 500 MG PO TABS: 1000.0000 mg | ORAL_TABLET | Freq: Two times a day (BID) | ORAL | 0 refills | Status: DC

## 2016-06-15 NOTE — ED Provider Notes (Signed)
[facility] DEPT Provider Note   CSN: [id number] Arrival date & time: 06/15/16  [id number]     History   Chief Complaint Chief Complaint  Patient presents with  . Dental Pain    HPI Phillip Turner is a 56 y.o. male with a PMHx of DM2, gout, arthritis, anxiety, depression, HTN, HLD, hypothyroidism, and sickle cell trait carrier, who presents to the ED with complaints of recurrent right upper dental pain that began one week ago and worsened over the last 2 days. He states this tooth has had issues in the past, he went to [facility] dental clinic but they said they could not help him because of his hypertension and that he needed medical clearance from his PCP before they could treat him. He has appointment with his PCP on 06/28/16 and a dentist appt on 07/04/16 (when his insurance kicks back in) but he is concerned that he can't wait that long because the pain has worsened and he has developed a small "knot" around the tooth. He describes his pain as 10/10 constant throbbing nonradiating right upper molar pain, worse with chewing and cold air, unrelieved with ibuprofen, and no known alleviating factors. He has needed PCN in the past for these dental abscesses, and has always done well with that. No known abx allergies. Nonsmoker. He denies fevers, chills, ear pain/drainage, gum drainage, trismus, drooling, CP, SOB, abd pain, N/V/D/C, hematuria, dysuria, myalgias, arthralgias, numbness, tingling, focal weakness, or any other complaints at this time.    The history is provided by the patient and medical records. No language interpreter was used.  Dental Pain   This is a recurrent problem. The current episode started more than 2 days ago. The problem occurs constantly. The problem has been gradually worsening. The pain is at a severity of 10/10. The pain is severe. Treatments tried: ibuprofen. The treatment provided no relief.    Past Medical History:  Diagnosis Date  . Anxiety    from past drug use not  longer having anxiety  . Arthritis   . Depression    past depression relating to drug use   . Diabetes mellitus   . Gout   . Hyperlipidemia   . Hypertension   . Hypothyroidism   . Rheumatic fever    as a teenager  . 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 Stroke [facility])    does not remember when, weakness on right side, some trouble speaking at times  . Substance abuse    former cocaine user- not since March 2014    Patient Active Problem List   Diagnosis Date Noted  . Primary osteoarthritis of right hip 03/04/2016  . Primary osteoarthritis of left hip 10/17/2015  . CVA (cerebral infarction) 09/08/2013  . HTN (hypertension) 03/12/2012  . Major depression, recurrent ([place]) 03/10/2012  . Anxiety disorder 03/10/2012  . Leukocytosis 03/08/2012  . Hyponatremia 03/08/2012  . DM (diabetes mellitus), type 2, uncontrolled with complications ([place]) 99991111  . Hypothyroidism 03/29/2011  . Depression 03/27/2011  . H/O 03/27/2011  . Memory impairment 03/27/2011  . Alcohol dependence ([place]) 03/26/2011  . 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 Cocaine abuse 03/26/2011    Past Surgical History:  Procedure Laterality Date  . boil removed from tailbone     . COLONOSCOPY    . TONSILLECTOMY    . TOTAL HIP ARTHROPLASTY Left 11/14/2015   Procedure: LEFT TOTAL HIP ARTHROPLASTY ANTERIOR APPROACH;  Surgeon: Renette Butters, MD;  Location: [place];  Service: Orthopedics;  Laterality: Left;  . TOTAL HIP ARTHROPLASTY Right 03/26/2016   Procedure: RIGHT TOTAL HIP ARTHROPLASTY ANTERIOR APPROACH;  Surgeon: Renette Butters, MD;  Location: [place];  Service: Orthopedics;  Laterality: Right;       Home Medications    Prior to Admission medications   Medication Sig Start Date End Date Taking? Authorizing Provider  amLODipine (NORVASC) 10 MG tablet TAKE 1 TABLET BY MOUTH EVERY DAY FOR BLOOD PRESSURE 06/06/14   Lew Dawes V, MD  amoxicillin (AMOXIL) 500 MG capsule Take 1 capsule  (500 mg total) by mouth 3 (three) times daily. 05/04/16   Larene Pickett, PA-C  aspirin EC 325 MG tablet Take 1 tablet (325 mg total) by mouth daily. For 30 days postop for DVT prophylaxis.  Resume home dose when complete. 03/26/16   Charna Elizabeth Martensen III, PA-C  colchicine 0.6 MG tablet TAKE 1 TABLET BY MOUTH TWICE DAILY FOR GOUT FLARE-UPS 09/12/14   Aleksei Plotnikov V, MD  docusate sodium (COLACE) 100 MG capsule Take 1 capsule (100 mg total) by mouth 2 (two) times daily. 03/26/16   Charna Elizabeth Martensen III, PA-C  EDARBYCLOR 40-25 MG TABS Take 1 tablet by mouth daily. Will stop 1 week prior to surgery 09/04/15   Historical Provider, MD  empagliflozin (JARDIANCE) 10 MG TABS tablet Take 25 mg by mouth daily.     Historical Provider, MD  glipiZIDE (GLUCOTROL) 5 MG tablet Take 5 mg by mouth 2 (two) times daily before a meal.     Historical Provider, MD  HYDROcodone-acetaminophen (NORCO/VICODIN) 5-325 MG tablet Take 1 tablet by mouth 2 (two) times daily as needed for severe pain. 12/27/15   Everlene Balls, MD  levothyroxine (SYNTHROID, LEVOTHROID) 100 MCG tablet Take 100 mcg by mouth daily.  08/04/15   Historical Provider, MD  LINZESS 145 MCG CAPS capsule Take 1 tablet by mouth daily. 08/04/15   Historical Provider, MD  methocarbamol (ROBAXIN) 500 MG tablet Take 1 tablet (500 mg total) by mouth every 6 (six) hours as needed for muscle spasms. 03/26/16   Charna Elizabeth Martensen III, PA-C  metoprolol succinate (TOPROL-XL) 25 MG 24 hr tablet Take 25 mg by mouth daily.    Historical Provider, MD  omeprazole (PRILOSEC) 20 MG capsule Take 1 capsule (20 mg total) by mouth daily. While taking anti inflammatory medicine daily 03/26/16   Charna Elizabeth Martensen III, PA-C  ondansetron (ZOFRAN) 4 MG tablet Take 1 tablet (4 mg total) by mouth every 8 (eight) hours as needed for nausea or vomiting. 03/26/16   Charna Elizabeth Martensen III, PA-C  oxyCODONE-acetaminophen (ROXICET) 5-325 MG tablet Take 1-2 tablets by mouth every 4  (four) hours as needed for severe pain. 03/26/16   Charna Elizabeth Martensen III, PA-C  potassium chloride SA (K-DUR,KLOR-CON) 20 MEQ tablet Take 2 tablets (40 mEq total) by mouth 2 (two) times daily. Patient taking differently: Take 20 mEq by mouth 3 (three) times daily. Takes 1 tablet daily, can take another tablet if needed for fluid/edema 07/13/14   Lew Dawes V, MD  sildenafil (VIAGRA) 100 MG tablet Take 100 mg by mouth as needed for erectile dysfunction.    Historical Provider, MD  simvastatin (ZOCOR) 10 MG tablet Take 1 tablet by mouth daily. 11/04/14   Historical Provider, MD  traZODone (DESYREL) 100 MG tablet TAKE 2 TABLETS BY MOUTH EVERY NIGHT AT BEDTIME FOR SLEEP/DEPRESSION 07/07/14   Cassandria Anger, MD    Family History Family History  Problem Relation Age of Onset  .  Coronary artery disease Mother   . Diabetes type II Father   . Hypertension Father   . Colon cancer Neg Hx   . Esophageal cancer Neg Hx   . Rectal cancer Neg Hx   . Stomach cancer Neg Hx     Social History Social History  Substance Use Topics  . Smoking status: Former Smoker    Years: 5.00    Types: Cigarettes  . Smokeless tobacco: Never Used  . Alcohol use No     Allergies   Clonidine derivatives; Shellfish allergy; Naproxen; Indomethacin; Invokana [canagliflozin]; Metformin and related; and Other   Review of Systems Review of Systems  Constitutional: Negative for chills and fever.  HENT: Positive for dental problem. Negative for drooling, ear discharge, ear pain, facial swelling and trouble swallowing.   Respiratory: Negative for shortness of breath.   Cardiovascular: Negative for chest pain.  Gastrointestinal: Negative for abdominal pain, constipation, diarrhea, nausea and vomiting.  Genitourinary: Negative for dysuria and hematuria.  Musculoskeletal: Negative for arthralgias and myalgias.  Skin: Negative for color change.  Allergic/Immunologic: Positive for immunocompromised state (DM2).    Neurological: Negative for weakness and numbness.  Psychiatric/Behavioral: Negative for confusion.   10 Systems reviewed and are negative for acute change except as noted in the HPI.   Physical Exam Updated Vital Signs BP 162/90 (BP Location: Left Arm)   Pulse (!) 53   Temp 97.7 F (36.5 C) (Oral)   Resp 18   SpO2 94%   Physical Exam  Constitutional: He is oriented to person, place, and time. Vital signs are normal. He appears well-developed and well-nourished.  Non-toxic appearance. No distress.  Afebrile, nontoxic, NAD  HENT:  Head: Normocephalic and atraumatic.  Nose: Nose normal.  Mouth/Throat: Uvula is midline, oropharynx is clear and moist and mucous membranes are normal. No trismus in the jaw. Dental abscesses and dental caries present. No uvula swelling. Tonsils are 0 on the right. Tonsils are 0 on the left. No tonsillar exudate.    R upper molar #2 decayed with small surrounding gingival swelling, +abscess which is indurated, mildly erythematous, no drainage or fluctuance, no evidence of ludwig's. Nose clear. Oropharynx clear and moist, without uvular swelling or deviation, no trismus or drooling, no tonsillar swelling or erythema, no exudates.    Eyes: Conjunctivae and EOM are normal. Right eye exhibits no discharge. Left eye exhibits no discharge.  Neck: Normal range of motion. Neck supple.  Cardiovascular: Normal rate and intact distal pulses.   Pulmonary/Chest: Effort normal. No respiratory distress.  Abdominal: Normal appearance. He exhibits no distension.  Musculoskeletal: Normal range of motion.  Neurological: He is alert and oriented to person, place, and time. He has normal strength. No sensory deficit.  Skin: Skin is warm, dry and intact. No rash noted.  Psychiatric: He has a normal mood and affect.  Nursing note and vitals reviewed.    ED Treatments / Results  Labs (all labs ordered are listed, but only abnormal results are displayed) Labs Reviewed - No  data to display  EKG  EKG Interpretation None       Radiology No results found.  Procedures Procedures (including critical care time)  Medications Ordered in ED Medications - No data to display   Initial Impression / Assessment and Plan / ED Course  I have reviewed the triage vital signs and the nursing notes.  Pertinent labs & imaging results that were available during my care of the patient were reviewed by me and considered in my  medical decision making (see chart for details).     56 y.o. male here with Dental pain associated with dental decay and small indurated dental abscess with patient afebrile, non toxic appearing and swallowing secretions well, no evidence of ludwig's. Not amendable to I&D at this time. I gave patient referral to dentist and stressed the importance of dental follow up for ultimate management of dental pain.  I have also discussed reasons to return immediately to the ER.  Patient expresses understanding and agrees with plan.  I will also give PCN VK and discussed OTC remedies for pain control.   Final Clinical Impressions(s) / ED Diagnoses   Final diagnoses:  Pain due to dental caries  Dental abscess    New Prescriptions New Prescriptions   PENICILLIN V POTASSIUM (VEETID) 500 MG TABLET    Take 2 tablets (1,000 mg total) by mouth 2 (two) times daily. X 7 days     [address] PA-C 06/15/16 [place], MD 06/16/16 [phone]

## 2016-06-15 NOTE — ED Triage Notes (Signed)
Pt reports ongoing R upper dental pain. Went to dental clinic a few days ago, but they could not do anything due to BP at that time. Pt also reports a bump on his gum.

## 2016-06-15 NOTE — Discharge Instructions (Signed)
Apply warm compresses to jaw throughout the day. Take antibiotic until finished. Use tylenol or motrin as needed for pain. Perform salt water swishes to help with pain/swelling. Use over the counter oragel as needed for additional relief. Followup with a dentist is very important for ongoing evaluation and management of recurrent dental pain, call the dentist listed above in the next 24-48 hours to schedule ongoing dental care, or use the list below to find a dentist. Return to emergency department for emergent changing or worsening symptoms.

## 2017-11-28 ENCOUNTER — Emergency Department (HOSPITAL_COMMUNITY): Payer: Medicare HMO

## 2017-11-28 ENCOUNTER — Encounter (HOSPITAL_COMMUNITY): Payer: Self-pay

## 2017-11-28 ENCOUNTER — Other Ambulatory Visit: Payer: Self-pay

## 2017-11-28 ENCOUNTER — Emergency Department (HOSPITAL_COMMUNITY)
Admission: EM | Admit: 2017-11-28 | Discharge: 2017-11-28 | Disposition: A | Discharge status: Home / Self Care | Payer: Medicare HMO | Attending: Emergency Medicine | Admitting: Emergency Medicine

## 2017-11-28 DIAGNOSIS — Z7982 Long term (current) use of aspirin: Secondary | ICD-10-CM | POA: Diagnosis not present

## 2017-11-28 DIAGNOSIS — E119 Type 2 diabetes mellitus without complications: Secondary | ICD-10-CM | POA: Diagnosis not present

## 2017-11-28 DIAGNOSIS — Z7984 Long term (current) use of oral hypoglycemic drugs: Secondary | ICD-10-CM | POA: Diagnosis not present

## 2017-11-28 DIAGNOSIS — Z79899 Other long term (current) drug therapy: Secondary | ICD-10-CM | POA: Insufficient documentation

## 2017-11-28 DIAGNOSIS — M5440 Lumbago with sciatica, unspecified side: Secondary | ICD-10-CM | POA: Diagnosis not present

## 2017-11-28 DIAGNOSIS — Z96643 Presence of artificial hip joint, bilateral: Secondary | ICD-10-CM | POA: Insufficient documentation

## 2017-11-28 DIAGNOSIS — Z8673 Personal history of transient ischemic attack (TIA), and cerebral infarction without residual deficits: Secondary | ICD-10-CM | POA: Diagnosis not present

## 2017-11-28 DIAGNOSIS — R109 Unspecified abdominal pain: Secondary | ICD-10-CM | POA: Insufficient documentation

## 2017-11-28 DIAGNOSIS — I10 Essential (primary) hypertension: Secondary | ICD-10-CM | POA: Diagnosis not present

## 2017-11-28 DIAGNOSIS — Z87891 Personal history of nicotine dependence: Secondary | ICD-10-CM | POA: Insufficient documentation

## 2017-11-28 DIAGNOSIS — M545 Low back pain: Secondary | ICD-10-CM | POA: Diagnosis present

## 2017-11-28 LAB — CBC WITH DIFFERENTIAL/PLATELET
Basophils Absolute: 0 10*3/uL (ref 0.0–0.1)
Basophils Relative: 0 %
Eosinophils Absolute: 0.2 10*3/uL (ref 0.0–0.7)
Eosinophils Relative: 2 %
HCT: 40.6 % (ref 39.0–52.0)
Hemoglobin: 14.2 g/dL (ref 13.0–17.0)
Lymphocytes Relative: 21 %
Lymphs Abs: 2.2 10*3/uL (ref 0.7–4.0)
MCH: 26 pg (ref 26.0–34.0)
MCHC: 35 g/dL (ref 30.0–36.0)
MCV: 74.2 fL — ABNORMAL LOW (ref 78.0–100.0)
Monocytes Absolute: 1.2 10*3/uL — ABNORMAL HIGH (ref 0.1–1.0)
Monocytes Relative: 12 %
Neutro Abs: 6.8 10*3/uL (ref 1.7–7.7)
Neutrophils Relative %: 65 %
Platelets: 276 10*3/uL (ref 150–400)
RBC: 5.47 MIL/uL (ref 4.22–5.81)
RDW: 17.3 % — ABNORMAL HIGH (ref 11.5–15.5)
WBC: 10.5 10*3/uL (ref 4.0–10.5)

## 2017-11-28 LAB — COMPREHENSIVE METABOLIC PANEL
ALK PHOS: 80 U/L (ref 38–126)
ALT: 18 U/L (ref 0–44)
ANION GAP: 7 (ref 5–15)
AST: 20 U/L (ref 15–41)
Albumin: 3.7 g/dL (ref 3.5–5.0)
BUN: 11 mg/dL (ref 6–20)
CALCIUM: 8.8 mg/dL — AB (ref 8.9–10.3)
CO2: 28 mmol/L (ref 22–32)
CREATININE: 1.24 mg/dL (ref 0.61–1.24)
Chloride: 105 mmol/L (ref 98–111)
Glucose, Bld: 102 mg/dL — ABNORMAL HIGH (ref 70–99)
Potassium: 2.8 mmol/L — ABNORMAL LOW (ref 3.5–5.1)
SODIUM: 140 mmol/L (ref 135–145)
Total Bilirubin: 1.5 mg/dL — ABNORMAL HIGH (ref 0.3–1.2)
Total Protein: 7.1 g/dL (ref 6.5–8.1)

## 2017-11-28 MED ORDER — OXYCODONE-ACETAMINOPHEN 5-325 MG PO TABS: 1.0000 | ORAL_TABLET | ORAL | 0 refills | Status: DC | PRN

## 2017-11-28 MED ORDER — POTASSIUM CHLORIDE 10 MEQ/100ML IV SOLN
10.0000 meq | Freq: Once | INTRAVENOUS | Status: AC
  Administered 2017-11-28: 10 meq via INTRAVENOUS
  Filled 2017-11-28: qty 100

## 2017-11-28 MED ORDER — HYDROMORPHONE HCL 1 MG/ML IJ SOLN
1.0000 mg | Freq: Once | INTRAMUSCULAR | Status: AC
  Administered 2017-11-28: 1 mg via INTRAVENOUS
  Filled 2017-11-28: qty 1

## 2017-11-28 MED ORDER — PREDNISONE 10 MG PO TABS: 20.0000 mg | ORAL_TABLET | Freq: Every day | ORAL | 0 refills | Status: DC

## 2017-11-28 MED ORDER — IOPAMIDOL (ISOVUE-300) INJECTION 61%
INTRAVENOUS | Status: AC
  Filled 2017-11-28: qty 100

## 2017-11-28 MED ORDER — POTASSIUM CHLORIDE CRYS ER 20 MEQ PO TBCR
40.0000 meq | EXTENDED_RELEASE_TABLET | Freq: Once | ORAL | Status: AC
  Administered 2017-11-28: 40 meq via ORAL
  Filled 2017-11-28: qty 2

## 2017-11-28 MED ORDER — SODIUM CHLORIDE 0.9 % IV BOLUS
1000.0000 mL | Freq: Once | INTRAVENOUS | Status: AC
Start: 2017-11-28 — End: 2017-11-28
  Administered 2017-11-28: 1000 mL via INTRAVENOUS

## 2017-11-28 MED ORDER — IOPAMIDOL (ISOVUE-300) INJECTION 61%
100.0000 mL | Freq: Once | INTRAVENOUS | Status: AC | PRN
  Administered 2017-11-28: 100 mL via INTRAVENOUS

## 2017-11-28 MED ORDER — METHYLPREDNISOLONE SODIUM SUCC 125 MG IJ SOLR
125.0000 mg | Freq: Once | INTRAMUSCULAR | Status: AC
  Administered 2017-11-28: 125 mg via INTRAVENOUS
  Filled 2017-11-28: qty 2

## 2017-11-28 MED ORDER — ONDANSETRON HCL 4 MG/2ML IJ SOLN
4.0000 mg | Freq: Once | INTRAMUSCULAR | Status: AC
  Administered 2017-11-28: 4 mg via INTRAVENOUS
  Filled 2017-11-28: qty 2

## 2017-11-28 NOTE — ED Provider Notes (Signed)
[facility] DEPT Provider Note   CSN: [id number] Arrival date & time: 11/28/17  0806     History   Chief Complaint Chief Complaint  Patient presents with  . Hip Pain    HPI Phillip Turner is a 57 y.o. male.  Patient complains of low back pain.  Some radiation down both legs  The history is provided by the patient. No language interpreter was used.  Hip Pain  This is a new problem. The current episode started more than 2 days ago. The problem occurs constantly. The problem has not changed since onset.Pertinent negatives include no chest pain, no abdominal pain and no headaches. Exacerbated by: Movement. Nothing relieves the symptoms. He has tried nothing for the symptoms. The treatment provided no relief.    Past Medical History:  Diagnosis Date  . Anxiety    from past drug use not longer having anxiety  . Arthritis   . Depression    past depression relating to drug use   . Diabetes mellitus   . Gout   . Hyperlipidemia   . Hypertension   . Hypothyroidism   . Rheumatic fever    as a teenager  . 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 Stroke [facility])    does not remember when, weakness on right side, some trouble speaking at times  . Substance abuse ([place])    former cocaine user- not since March 2014    Patient Active Problem List   Diagnosis Date Noted  . Primary osteoarthritis of right hip 03/04/2016  . Primary osteoarthritis of left hip 10/17/2015  . CVA (cerebral infarction) 09/08/2013  . HTN (hypertension) 03/12/2012  . Major depression, recurrent ([place]) 03/10/2012  . Anxiety disorder 03/10/2012  . Leukocytosis 03/08/2012  . Hyponatremia 03/08/2012  . DM (diabetes mellitus), type 2, uncontrolled with complications ([place]) 09/32/6712  . Hypothyroidism 03/29/2011  . Depression 03/27/2011  . H/O 03/27/2011  . Memory impairment 03/27/2011  . Alcohol dependence ([place]) 03/26/2011  . 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 Cocaine abuse ([place]) 03/26/2011    Past Surgical History:  Procedure Laterality Date  . boil removed from tailbone     . COLONOSCOPY    . TONSILLECTOMY    . TOTAL HIP ARTHROPLASTY Left 11/14/2015   Procedure: LEFT TOTAL HIP ARTHROPLASTY ANTERIOR APPROACH;  Surgeon: Renette Butters, MD;  Location: [facility];  Service: Orthopedics;  Laterality: Left;  . TOTAL HIP ARTHROPLASTY Right 03/26/2016   Procedure: RIGHT TOTAL HIP ARTHROPLASTY ANTERIOR APPROACH;  Surgeon: Renette Butters, MD;  Location: [facility];  Service: Orthopedics;  Laterality: Right;        Home Medications    Prior to Admission medications   Medication Sig Start Date End Date Taking? Authorizing Provider  amLODipine (NORVASC) 10 MG tablet TAKE 1 TABLET BY MOUTH EVERY DAY FOR BLOOD PRESSURE 06/06/14  Yes Plotnikov, Evie Lacks, MD  aspirin EC 325 MG tablet Take 1 tablet (325 mg total) by mouth daily. For 30 days postop for DVT prophylaxis.  Resume home dose when complete. 03/26/16  Yes Prudencio Burly III, PA-C  atorvastatin (LIPITOR) 10 MG tablet Take 10 mg by mouth daily.   Yes [provider]  colchicine 0.6 MG tablet TAKE 1 TABLET BY MOUTH TWICE DAILY FOR GOUT FLARE-UPS 09/12/14  Yes Plotnikov, Evie Lacks, MD  EDARBYCLOR 40-25 MG TABS Take 1 tablet by mouth daily. Will stop 1 week prior to surgery 09/04/15  Yes [provider]  empagliflozin (JARDIANCE) 25 MG TABS tablet Take 25 mg by mouth daily.    Yes [provider]  glipiZIDE (GLUCOTROL) 5 MG tablet Take 5 mg by mouth 2 (two) times daily before a meal.    Yes [provider]  levothyroxine (SYNTHROID, LEVOTHROID) 100 MCG tablet Take 100 mcg by mouth daily.  08/04/15  Yes [provider]  LINZESS 145 MCG CAPS capsule Take 1 tablet by mouth daily. 08/04/15  Yes [provider]  metoprolol succinate (TOPROL-XL) 25 MG 24 hr tablet Take 25 mg by mouth 2 (two) times daily.    Yes [provider]  potassium chloride  SA (K-DUR,KLOR-CON) 20 MEQ tablet Take 2 tablets (40 mEq total) by mouth 2 (two) times daily. Patient taking differently: Take 20 mEq by mouth daily.  07/13/14  Yes Plotnikov, Evie Lacks, MD  sildenafil (VIAGRA) 100 MG tablet Take 100 mg by mouth as needed for erectile dysfunction.   Yes [provider]  traZODone (DESYREL) 100 MG tablet TAKE 2 TABLETS BY MOUTH EVERY NIGHT AT BEDTIME FOR SLEEP/DEPRESSION 07/07/14  Yes Plotnikov, Evie Lacks, MD  oxyCODONE-acetaminophen (PERCOCET) 5-325 MG tablet Take 1 tablet by mouth every 4 (four) hours as needed. 11/28/17   Milton Ferguson, MD  predniSONE (DELTASONE) 10 MG tablet Take 2 tablets (20 mg total) by mouth daily. 11/28/17   Milton Ferguson, MD    Family History Family History  Problem Relation Age of Onset  . Coronary artery disease Mother   . Diabetes type II Father   . Hypertension Father   . Colon cancer Neg Hx   . Esophageal cancer Neg Hx   . Rectal cancer Neg Hx   . Stomach cancer Neg Hx     Social History Social History   Tobacco Use  . Smoking status: Former Smoker    Years: 5.00    Types: Cigarettes  . Smokeless tobacco: Never Used  Substance Use Topics  . Alcohol use: No  . Drug use: No    Comment: Cocaine, crack former- since 2014     Allergies   Clonidine derivatives; Shellfish allergy; Naproxen; Indomethacin; Invokana [canagliflozin]; Metformin and related; and Other   Review of Systems Review of Systems  Constitutional: Negative for appetite change and fatigue.  HENT: Negative for congestion, ear discharge and sinus pressure.   Eyes: Negative for discharge.  Respiratory: Negative for cough.   Cardiovascular: Negative for chest pain.  Gastrointestinal: Negative for abdominal pain and diarrhea.  Genitourinary: Negative for frequency and hematuria.  Musculoskeletal: Positive for back pain.  Skin: Negative for rash.  Neurological: Negative for seizures and headaches.       Patient has not lost control of his  bowels or bladder  Psychiatric/Behavioral: Negative for hallucinations.     Physical Exam Updated Vital Signs BP 125/77   Pulse (!) 44   Wt 132.5 kg (292 lb)   SpO2 96%   BMI 43.12 kg/m   Physical Exam  Constitutional: He is oriented to person, place, and time. He appears well-developed.  HENT:  Head: Normocephalic.  Eyes: Conjunctivae and EOM are normal. No scleral icterus.  Neck: Neck supple. No thyromegaly present.  Cardiovascular: Normal rate and regular rhythm. Exam reveals no gallop and no friction rub.  No murmur heard. Pulmonary/Chest: No stridor. He has no wheezes. He has no rales. He exhibits no tenderness.  Abdominal: He exhibits no distension. There is no tenderness. There is no rebound.  Musculoskeletal: Normal range of motion. He exhibits  no edema.  Tenderness to the lumbar spine  Lymphadenopathy:    He has no cervical adenopathy.  Neurological: He is oriented to person, place, and time. He exhibits normal muscle tone. Coordination normal.  Skin: No rash noted. No erythema.  Psychiatric: He has a normal mood and affect. His behavior is normal.     ED Treatments / Results  Labs (all labs ordered are listed, but only abnormal results are displayed) Labs Reviewed  CBC WITH DIFFERENTIAL/PLATELET - Abnormal; Notable for the following components:      Result Value   MCV 74.2 (*)    RDW 17.3 (*)    Monocytes Absolute 1.2 (*)    All other components within normal limits  COMPREHENSIVE METABOLIC PANEL - Abnormal; Notable for the following components:   Potassium 2.8 (*)    Glucose, Bld 102 (*)    Calcium 8.8 (*)    Total Bilirubin 1.5 (*)    All other components within normal limits    EKG None  Radiology Dg Lumbar Spine Complete  Result Date: 11/28/2017 CLINICAL DATA:  Pain. Low back pain that extends into the hips bilaterally. EXAM: LUMBAR SPINE - COMPLETE 4+ VIEW COMPARISON:  MRI lumbar spine 12/21/2014. FINDINGS: Five non rib-bearing lumbar type  vertebral bodies are present. Slight retrolisthesis is again noted at L3-4. Vertebral body heights are maintained. Disc spaces are unchanged. Bilateral hip replacements are noted. IMPRESSION: Stable mild degenerative changes of the lumbar spine at L3-4. No acute or focal abnormality to explain the patient's symptoms otherwise. Electronically Signed   By: San Morelle M.D.   On: 11/28/2017 09:25   Ct Abdomen Pelvis W Contrast  Result Date: 11/28/2017 CLINICAL DATA:  bilateral hip pain (non-traumatic) with abdominal pain and distension. Patient reports having a hip replacement 2 years ago, with good ROM and feeling until 2 weeks ago. Patient reports having difficulty walking and "tightness" in his hips with radiation down his bilateral legs. Positive pulses noted to bilateral lower extremities, and patient verbalizes positive sensation to bilateral extremities. Patient has history of diabetes and HTN. EMS CBG= 122 EXAM: CT ABDOMEN AND PELVIS WITH CONTRAST TECHNIQUE: Multidetector CT imaging of the abdomen and pelvis was performed using the standard protocol following bolus administration of intravenous contrast. CONTRAST:  147mL ISOVUE-300 IOPAMIDOL (ISOVUE-300) INJECTION 61% COMPARISON:  None. FINDINGS: Lower chest: No acute abnormality. Hepatobiliary: Multiple subcentimeter stones layer in the dependent aspect of the nondilated gallbladder, without evident wall thickening or adjacent inflammatory change. Liver unremarkable. No biliary ductal dilatation. Pancreas: Unremarkable. No pancreatic ductal dilatation or surrounding inflammatory changes. Spleen: Normal in size without focal abnormality. Adrenals/Urinary Tract: Small left renal cysts. 1 mm calcification in the lower pole left renal collecting system. No nephrolithiasis or ureterectasis. Urinary bladder physiologically distended. Stomach/Bowel: Stomach is within normal limits. Appendix appears normal. No evidence of bowel wall thickening,  distention, or inflammatory changes. Vascular/Lymphatic: Minimal aortoiliac calcified plaque. No abdominal or pelvic adenopathy. Reproductive: Prostate is unremarkable. Other: No ascites.  No free air. Musculoskeletal: No fracture . Bilateral hip arthroplasty hardware, incompletely visualized. There is lucency adjacent to femoral and acetabular components on the left. IMPRESSION: 1. No acute abdominal process. 2. Cholelithiasis 3. Left nephrolithiasis without hydronephrosis. 4. Lucency around femoral and acetabular components of left hip arthroplasty components. Correlate with clinical / laboratory evidence of loosening / infection. Electronically Signed   By: Lucrezia Europe M.D.   On: 11/28/2017 12:58    Procedures Procedures (including critical care time)  Medications Ordered in ED Medications  iopamidol (ISOVUE-300) 61 % injection (has no administration in time range)  potassium chloride SA (K-DUR,KLOR-CON) CR tablet 40 mEq (has no administration in time range)  methylPREDNISolone sodium succinate (SOLU-MEDROL) 125 mg/2 mL injection 125 mg (125 mg Intravenous Given 11/28/17 0924)  HYDROmorphone (DILAUDID) injection 1 mg (1 mg Intravenous Given 11/28/17 0924)  ondansetron (ZOFRAN) injection 4 mg (4 mg Intravenous Given 11/28/17 0924)  sodium chloride 0.9 % bolus 1,000 mL (0 mLs Intravenous Stopped 11/28/17 1305)  potassium chloride 10 mEq in 100 mL IVPB (0 mEq Intravenous Stopped 11/28/17 1305)  iopamidol (ISOVUE-300) 61 % injection 100 mL (100 mLs Intravenous Contrast Given 11/28/17 1237)     Initial Impression / Assessment and Plan / ED Course  I have reviewed the triage vital signs and the nursing notes.  Pertinent labs & imaging results that were available during my care of the patient were reviewed by me and considered in my medical decision making (see chart for details).     CT scan unremarkable plain films do show some degenerative changes in his lumbar spine.  Suspect discomfort is related  to his spine.  Patient will be taking his muscle relaxer along with pain medicine.  He also had decided to try some prednisone even though his can arrange that sugar.  Patient will follow-up with his PCP next week and just rest over the next few days  Final Clinical Impressions(s) / ED Diagnoses   Final diagnoses:  Acute bilateral low back pain with sciatica, sciatica laterality unspecified    ED Discharge Orders        Ordered    oxyCODONE-acetaminophen (PERCOCET) 5-325 MG tablet  Every 4 hours PRN     11/28/17 1343    predniSONE (DELTASONE) 10 MG tablet  Daily     11/28/17 1343       Milton Ferguson, MD 11/28/17 1348

## 2017-11-28 NOTE — Discharge Instructions (Addendum)
Follow-up with your family doctor next week for recheck.  Just rest for the next few days.Marland Kitchen  Keep checking your blood sugar.  Take your muscle relaxer at least twice a day

## 2017-11-28 NOTE — ED Notes (Signed)
Patient transported to x-ray. ?

## 2017-11-28 NOTE — ED Notes (Signed)
Bed: [facility]80 Expected date:  Expected time:  Means of arrival:  Comments: EMS-B/L hip pain

## 2017-11-28 NOTE — ED Triage Notes (Signed)
Patient BIB [facility] from home with complaints of bilateral hip pain (non-traumatic). Patient reports having a hip replacement 2 years ago, with good ROM and feeling until 2 weeks ago. Patient reports having difficulty walking and "tightness" in his hips with radiation down his bilateral legs. Positive pulses noted to bilateral lower extremities, and patient verbalizes positive sensation to bilateral extremities. Patient has history of diabetes and HTN. EMS CBG= 122.

## 2018-03-31 ENCOUNTER — Emergency Department (HOSPITAL_COMMUNITY)
Admission: EM | Admit: 2018-03-31 | Discharge: 2018-03-31 | Disposition: A | Discharge status: Home / Self Care | Payer: Medicare HMO | Attending: Emergency Medicine | Admitting: Emergency Medicine

## 2018-03-31 ENCOUNTER — Encounter (HOSPITAL_COMMUNITY): Payer: Self-pay

## 2018-03-31 DIAGNOSIS — Z79899 Other long term (current) drug therapy: Secondary | ICD-10-CM | POA: Diagnosis not present

## 2018-03-31 DIAGNOSIS — E119 Type 2 diabetes mellitus without complications: Secondary | ICD-10-CM | POA: Insufficient documentation

## 2018-03-31 DIAGNOSIS — I10 Essential (primary) hypertension: Secondary | ICD-10-CM | POA: Insufficient documentation

## 2018-03-31 DIAGNOSIS — M5442 Lumbago with sciatica, left side: Secondary | ICD-10-CM | POA: Diagnosis not present

## 2018-03-31 DIAGNOSIS — M5441 Lumbago with sciatica, right side: Secondary | ICD-10-CM | POA: Insufficient documentation

## 2018-03-31 DIAGNOSIS — Z7984 Long term (current) use of oral hypoglycemic drugs: Secondary | ICD-10-CM | POA: Diagnosis not present

## 2018-03-31 DIAGNOSIS — Z87891 Personal history of nicotine dependence: Secondary | ICD-10-CM | POA: Diagnosis not present

## 2018-03-31 DIAGNOSIS — E039 Hypothyroidism, unspecified: Secondary | ICD-10-CM | POA: Diagnosis not present

## 2018-03-31 DIAGNOSIS — M545 Low back pain: Secondary | ICD-10-CM | POA: Diagnosis present

## 2018-03-31 MED ORDER — PREDNISONE 10 MG PO TABS: 50.0000 mg | ORAL_TABLET | Freq: Every day | ORAL | 0 refills | Status: AC

## 2018-03-31 MED ORDER — CYCLOBENZAPRINE HCL 10 MG PO TABS
10.0000 mg | ORAL_TABLET | Freq: Once | ORAL | Status: AC
  Administered 2018-03-31: 10 mg via ORAL
  Filled 2018-03-31: qty 1

## 2018-03-31 MED ORDER — DEXAMETHASONE SODIUM PHOSPHATE 10 MG/ML IJ SOLN
10.0000 mg | Freq: Once | INTRAMUSCULAR | Status: AC
  Administered 2018-03-31: 10 mg via INTRAMUSCULAR
  Filled 2018-03-31: qty 1

## 2018-03-31 MED ORDER — PREDNISONE 10 MG (21) PO TBPK: ORAL_TABLET | Freq: Every day | ORAL | 0 refills | Status: DC

## 2018-03-31 NOTE — ED Triage Notes (Addendum)
Pt presents with c/o back pain and bilateral hip pain. Pt reports that he has had hip replacements several years ago but has been fine since then. Pt reports he was cleaning the house on Sunday night and noticed pain after that. Pt has a HR of 42 in triage, reports he does have a low HR, on metoprolol twice a day.

## 2018-03-31 NOTE — Discharge Instructions (Addendum)
1. Medications: Take steroid taper as prescribed with food to avoid upset stomach issues.  Do not take ibuprofen, Advil, Aleve, or Motrin while taking this medicine.  Check your blood sugar while you are taking this medicine and stop taking it if your blood sugars increased significantly.  You may take [phone] mg of Tylenol every 6 hours as needed for pain. Do not exceed 4000 mg of Tylenol daily.  You can take your baclofen as needed for muscle relaxation but this medication may make you drowsy so do not drive, drink alcohol, operate heavy machinery, or make important decisions while you are using this medicine.  I typically recommend only taking this medicine at night.  You can also cut these tablets in half if they are very strong. 2. Treatment: rest, ice, elevate and use brace, drink plenty of fluids, gentle stretching (see attached exercises, but you can also YouTube search  low back physical therapy exercises).   3. Follow Up: Please followup with orthopedics as directed or your PCP in 1 week if no improvement for discussion of your diagnoses and further evaluation after today's visit; if you do not have a primary care doctor use the resource guide provided to find one; Please return to the ER for worsening symptoms or other concerns such as worsening swelling, redness of the skin, fevers, loss of pulses, or loss of feeling

## 2018-03-31 NOTE — ED Provider Notes (Addendum)
[facility] DEPT Provider Note   CSN: [id number] Arrival date & time: 03/31/18  1042     History   Chief Complaint Chief Complaint  Patient presents with  . Hip Pain  . Back Pain    HPI Phillip Turner is a 57 y.o. male with history of anxiety, arthritis, depression, diabetes mellitus, hyperlipidemia, hypertension, rheumatic fever, sickle cell trait, former substance abuse presents for evaluation of acute onset, progressively worsening low back and bilateral hip pain for 4 days.  Reports similar episode in July which improved with oxycodone and prednisone.  States pain occurs with any position changes or bending and is localized to the low back radiating to the bilateral hips down to the knees.  Denies numbness or tingling, bowel or bladder incontinence, saddle anesthesia, fevers, or history of IV drug use.  Has tried ibuprofen, Tylenol, and stretching without relief of symptoms.  Had bilateral hip replacements 2 years ago with Dr. Fred[facility].  The history is provided by the patient.    Past Medical History:  Diagnosis Date  . Anxiety    from past drug use not longer having anxiety  . Arthritis   . Depression    past depression relating to drug use   . Diabetes mellitus   . Gout   . Hyperlipidemia   . Hypertension   . Hypothyroidism   . Rheumatic fever    as a teenager  . 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 Stroke [facility])    does not remember when, weakness on right side, some trouble speaking at times  . Substance abuse ([place])    former cocaine user- not since March 2014    Patient Active Problem List   Diagnosis Date Noted  . Primary osteoarthritis of right hip 03/04/2016  . Primary osteoarthritis of left hip 10/17/2015  . CVA (cerebral infarction) 09/08/2013  . HTN (hypertension) 03/12/2012  . Major depression, recurrent ([place]) 03/10/2012  . Anxiety disorder 03/10/2012  . Leukocytosis 03/08/2012  . Hyponatremia 03/08/2012    . DM (diabetes mellitus), type 2, uncontrolled with complications ([place]) 27/51/7001  . Hypothyroidism 03/29/2011  . Depression 03/27/2011  . H/O 03/27/2011  . Memory impairment 03/27/2011  . Alcohol dependence ([place]) 03/26/2011  . 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 Cocaine abuse ([place]) 03/26/2011    Past Surgical History:  Procedure Laterality Date  . boil removed from tailbone     . COLONOSCOPY    . TONSILLECTOMY    . TOTAL HIP ARTHROPLASTY Left 11/14/2015   Procedure: LEFT TOTAL HIP ARTHROPLASTY ANTERIOR APPROACH;  Surgeon: Renette Butters, MD;  Location: [facility];  Service: Orthopedics;  Laterality: Left;  . TOTAL HIP ARTHROPLASTY Right 03/26/2016   Procedure: RIGHT TOTAL HIP ARTHROPLASTY ANTERIOR APPROACH;  Surgeon: Renette Butters, MD;  Location: [facility];  Service: Orthopedics;  Laterality: Right;        Home Medications    Prior to Admission medications   Medication Sig Start Date End Date Taking? Authorizing Provider  amLODipine (NORVASC) 10 MG tablet TAKE 1 TABLET BY MOUTH EVERY DAY FOR BLOOD PRESSURE 06/06/14   Plotnikov, Evie Lacks, MD  aspirin EC 325 MG tablet Take 1 tablet (325 mg total) by mouth daily. For 30 days postop for DVT prophylaxis.  Resume home dose when complete. 03/26/16   Prudencio Burly III, PA-C  atorvastatin (LIPITOR) 10 MG tablet Take 10 mg by mouth daily.    [provider]  colchicine 0.6  MG tablet TAKE 1 TABLET BY MOUTH TWICE DAILY FOR GOUT FLARE-UPS 09/12/14   Plotnikov, Evie Lacks, MD  EDARBYCLOR 40-25 MG TABS Take 1 tablet by mouth daily. Will stop 1 week prior to surgery 09/04/15   [provider]  empagliflozin (JARDIANCE) 25 MG TABS tablet Take 25 mg by mouth daily.     [provider]  glipiZIDE (GLUCOTROL) 5 MG tablet Take 5 mg by mouth 2 (two) times daily before a meal.     [provider]  levothyroxine (SYNTHROID, LEVOTHROID) 100 MCG tablet Take 100 mcg by mouth daily.  08/04/15   [provider]  LINZESS 145 MCG CAPS capsule Take 1 tablet by mouth daily. 08/04/15   [provider]  metoprolol succinate (TOPROL-XL) 25 MG 24 hr tablet Take 25 mg by mouth 2 (two) times daily.     [provider]  oxyCODONE-acetaminophen (PERCOCET) 5-325 MG tablet Take 1 tablet by mouth every 4 (four) hours as needed. 11/28/17   Milton Ferguson, MD  potassium chloride SA (K-DUR,KLOR-CON) 20 MEQ tablet Take 2 tablets (40 mEq total) by mouth 2 (two) times daily. Patient taking differently: Take 20 mEq by mouth daily.  07/13/14   Plotnikov, Evie Lacks, MD  predniSONE (DELTASONE) 10 MG tablet Take 5 tablets (50 mg total) by mouth daily with breakfast for 5 days. 03/31/18 04/05/18  Rodell Perna A, PA-C  sildenafil (VIAGRA) 100 MG tablet Take 100 mg by mouth as needed for erectile dysfunction.    [provider]  traZODone (DESYREL) 100 MG tablet TAKE 2 TABLETS BY MOUTH EVERY NIGHT AT BEDTIME FOR SLEEP/DEPRESSION 07/07/14   Plotnikov, Evie Lacks, MD    Family History Family History  Problem Relation Age of Onset  . Coronary artery disease Mother   . Diabetes type II Father   . Hypertension Father   . Colon cancer Neg Hx   . Esophageal cancer Neg Hx   . Rectal cancer Neg Hx   . Stomach cancer Neg Hx     Social History Social History   Tobacco Use  . Smoking status: Former Smoker    Years: 5.00    Types: Cigarettes  . Smokeless tobacco: Never Used  Substance Use Topics  . Alcohol use: No  . Drug use: No    Comment: Cocaine, crack former- since 2014     Allergies   Clonidine derivatives; Shellfish allergy; Naproxen; Indomethacin; Invokana [canagliflozin]; Metformin and related; and Other   Review of Systems Review of Systems  Constitutional: Negative for fever.  Musculoskeletal: Positive for arthralgias and back pain.  Neurological: Negative for weakness and numbness.     Physical Exam Updated Vital Signs BP 139/89 (BP Location: Left Arm)   Pulse (!) 42    Temp (!) 97.4 F (36.3 C) (Oral)   Resp 18   Ht 5\' 10"  (1.778 m)   Wt 117.9 kg   SpO2 98%   BMI 37.31 kg/m   Physical Exam  Constitutional: He appears well-developed and well-nourished. No distress.  HENT:  Head: Normocephalic and atraumatic.  Eyes: Conjunctivae are normal. Right eye exhibits no discharge. Left eye exhibits no discharge.  Neck: No JVD present. No tracheal deviation present.  Cardiovascular: Normal rate and intact distal pulses.  2+ DP/PT pulses bilaterally, no lower extremity edema  Pulmonary/Chest: Effort normal.  Abdominal: He exhibits no distension.  Musculoskeletal: He exhibits no edema.  Tenderness to palpation of the lumbar region at around L4-S1 with bilateral paralumbar muscle tenderness and spasm.  Bilateral SI joint tenderness.  He does have some spasm of the hamstring muscles.  5/5 strength of BLE major muscle groups.  Pain worsens with extension and he has limited range of motion of the lumbar spine with extension secondary to pain but is able to flex without difficulty.  No tenderness to palpation of the hips, normal passive range of motion.  Neurological: He is alert.  Fluent speech, no facial droop, sensation intact to soft touch of bilateral lower extremities.  Ambulate with an antalgic gait but is able to Heel Walk and Toe Walk without difficulty and exhibits good balance  Skin: Skin is warm and dry. No erythema.  Psychiatric: He has a normal mood and affect. His behavior is normal.  Nursing note and vitals reviewed.    ED Treatments / Results  Labs (all labs ordered are listed, but only abnormal results are displayed) Labs Reviewed - No data to display  EKG None  Radiology No results found.  Procedures Procedures (including critical care time)  Medications Ordered in ED Medications  cyclobenzaprine (FLEXERIL) tablet 10 mg (10 mg Oral Given 03/31/18 1246)  dexamethasone (DECADRON) injection 10 mg (10 mg Intramuscular Given 03/31/18  1247)     Initial Impression / Assessment and Plan / ED Course  I have reviewed the triage vital signs and the nursing notes.  Pertinent labs & imaging results that were available during my care of the patient were reviewed by me and considered in my medical decision making (see chart for details).     Patient with back pain and hip pain.  He is afebrile, vital signs are at his baseline.  He is nontoxic in appearance.  No neurological deficits and normal neuro exam.  Patient can walk but states is painful.  No loss of bowel or bladder control.  No concern for cauda equina.  No fever, night sweats, weight loss, h/o cancer, IVDU.  Doubt dissection, septic joint, or DVT.  RICE protocol and pain medicine indicated and discussed with patient.  He reports he has good control of his blood sugars which run around 90 on average.  He has tolerated steroid taper in the past.  He is on baclofen at home and [place] controlled substance registry shows he is prescribed tramadol chronically.  Will discharge with steroid taper with instructions to discontinue if his blood sugars increased.  Recommend follow-up with PCP and orthopedics for reevaluation of symptoms.  Discussed strict ED return precautions.  Patient and his sister verbalized understanding of and agreement with plan and patient is stable for discharge home at this time.  1:06 PM Addendum: Patient insistent that we E prescribe his steroids to his pharmacy [facility] on [address].  The taper is not available for E prescribe, so I discontinued this and instead we will send him home on a prednisone burst for 5 days.   Final Clinical Impressions(s) / ED Diagnoses   Final diagnoses:  Acute bilateral low back pain with bilateral sciatica    ED Discharge Orders         Ordered    predniSONE (STERAPRED UNI-PAK 21 TAB) 10 MG (21) TBPK tablet  Daily,   Status:  Discontinued     03/31/18 1238    predniSONE (DELTASONE) 10 MG tablet  Daily with  breakfast     03/31/18 1305           Renita Papa, PA-C 03/31/18 [place], Tracy A, PA-C 03/31/18 [place], Julie,  MD 03/31/18 1427

## 2018-03-31 NOTE — ED Notes (Signed)
Bed: WTR6 Expected date:  Expected time:  Means of arrival:  Comments: 

## 2018-04-03 ENCOUNTER — Encounter (HOSPITAL_COMMUNITY): Payer: Self-pay | Admitting: *Deleted

## 2018-04-03 ENCOUNTER — Emergency Department (HOSPITAL_COMMUNITY)
Admission: EM | Admit: 2018-04-03 | Discharge: 2018-04-03 | Disposition: A | Discharge status: Home / Self Care | Payer: Medicare HMO | Attending: Emergency Medicine | Admitting: Emergency Medicine

## 2018-04-03 ENCOUNTER — Other Ambulatory Visit: Payer: Self-pay

## 2018-04-03 ENCOUNTER — Emergency Department (HOSPITAL_COMMUNITY): Payer: Medicare HMO

## 2018-04-03 DIAGNOSIS — Z96643 Presence of artificial hip joint, bilateral: Secondary | ICD-10-CM | POA: Insufficient documentation

## 2018-04-03 DIAGNOSIS — Z79899 Other long term (current) drug therapy: Secondary | ICD-10-CM | POA: Diagnosis not present

## 2018-04-03 DIAGNOSIS — M25552 Pain in left hip: Secondary | ICD-10-CM | POA: Insufficient documentation

## 2018-04-03 DIAGNOSIS — Z87891 Personal history of nicotine dependence: Secondary | ICD-10-CM | POA: Diagnosis not present

## 2018-04-03 DIAGNOSIS — Z7982 Long term (current) use of aspirin: Secondary | ICD-10-CM | POA: Insufficient documentation

## 2018-04-03 DIAGNOSIS — M25551 Pain in right hip: Secondary | ICD-10-CM

## 2018-04-03 DIAGNOSIS — E119 Type 2 diabetes mellitus without complications: Secondary | ICD-10-CM | POA: Diagnosis not present

## 2018-04-03 DIAGNOSIS — Z7984 Long term (current) use of oral hypoglycemic drugs: Secondary | ICD-10-CM | POA: Diagnosis not present

## 2018-04-03 DIAGNOSIS — E039 Hypothyroidism, unspecified: Secondary | ICD-10-CM | POA: Diagnosis not present

## 2018-04-03 DIAGNOSIS — I10 Essential (primary) hypertension: Secondary | ICD-10-CM | POA: Diagnosis not present

## 2018-04-03 MED ORDER — METHYLPREDNISOLONE SODIUM SUCC 125 MG IJ SOLR
125.0000 mg | Freq: Once | INTRAMUSCULAR | Status: AC
  Administered 2018-04-03: 125 mg via INTRAMUSCULAR
  Filled 2018-04-03: qty 2

## 2018-04-03 MED ORDER — KETOROLAC TROMETHAMINE 60 MG/2ML IM SOLN
60.0000 mg | Freq: Once | INTRAMUSCULAR | Status: AC
  Administered 2018-04-03: 60 mg via INTRAMUSCULAR
  Filled 2018-04-03: qty 2

## 2018-04-03 NOTE — ED Triage Notes (Signed)
Pt reports bilateral hip pain since Sunday.  Pt was evaluated for it on Monday. Pt given steroids and pain medications and told to come back if the pain got worse.  Pt having trouble standing and the pain goes into the pelvic area and back. Pt reports no loss of control of bowels but does report pain when he does have the use the restroom.  Hx bilateral hip replacements.

## 2018-04-03 NOTE — Discharge Instructions (Addendum)
-

## 2018-04-03 NOTE — ED Notes (Signed)
Patient transported to X-ray 

## 2018-04-03 NOTE — ED Provider Notes (Signed)
[facility] DEPT Provider Note   CSN: [id number] Arrival date & time: 04/03/18  1240     History   Chief Complaint Chief Complaint  Patient presents with  . Hip Pain    bilateral    HPI Phillip Turner is a 57 y.o. male.  The history is provided by the patient. No language interpreter was used.  Hip Pain  This is a recurrent problem. The current episode started more than 1 week ago. The problem occurs constantly. The problem has been gradually worsening. Pertinent negatives include no chest pain and no abdominal pain. The symptoms are aggravated by walking. Nothing relieves the symptoms.  Pt was seen here 3 days ago and started on prednisone.  Pt reports he had a similar episode a year ago that resolved with prednisone.  Pt has bilat hip replacement 2 years ago by Fredonia [facility] MD.   Past Medical History:  Diagnosis Date  . Anxiety    from past drug use not longer having anxiety  . Arthritis   . Depression    past depression relating to drug use   . Diabetes mellitus   . Gout   . Hyperlipidemia   . Hypertension   . Hypothyroidism   . Rheumatic fever    as a teenager  . 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 Stroke [facility])    does not remember when, weakness on right side, some trouble speaking at times  . Substance abuse ([place])    former cocaine user- not since March 2014    Patient Active Problem List   Diagnosis Date Noted  . Primary osteoarthritis of right hip 03/04/2016  . Primary osteoarthritis of left hip 10/17/2015  . CVA (cerebral infarction) 09/08/2013  . HTN (hypertension) 03/12/2012  . Major depression, recurrent ([place]) 03/10/2012  . Anxiety disorder 03/10/2012  . Leukocytosis 03/08/2012  . Hyponatremia 03/08/2012  . DM (diabetes mellitus), type 2, uncontrolled with complications ([place]) 40/98/1191  . Hypothyroidism 03/29/2011  . Depression 03/27/2011  . H/O 03/27/2011  . Memory impairment 03/27/2011  . Alcohol  dependence ([place]) 03/26/2011  . 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 Cocaine abuse ([place]) 03/26/2011    Past Surgical History:  Procedure Laterality Date  . boil removed from tailbone     . COLONOSCOPY    . TONSILLECTOMY    . TOTAL HIP ARTHROPLASTY Left 11/14/2015   Procedure: LEFT TOTAL HIP ARTHROPLASTY ANTERIOR APPROACH;  Surgeon: Renette Butters, MD;  Location: [facility];  Service: Orthopedics;  Laterality: Left;  . TOTAL HIP ARTHROPLASTY Right 03/26/2016   Procedure: RIGHT TOTAL HIP ARTHROPLASTY ANTERIOR APPROACH;  Surgeon: Renette Butters, MD;  Location: [facility];  Service: Orthopedics;  Laterality: Right;        Home Medications    Prior to Admission medications   Medication Sig Start Date End Date Taking? Authorizing Provider  acetaminophen (TYLENOL) 500 MG tablet Take 1,000-2,000 mg by mouth every 6 (six) hours as needed for mild pain or headache.   Yes [provider]  amLODipine (NORVASC) 10 MG tablet TAKE 1 TABLET BY MOUTH EVERY DAY FOR BLOOD PRESSURE Patient taking differently: Take 10 mg by mouth daily.  06/06/14  Yes Plotnikov, Evie Lacks, MD  aspirin EC 325 MG tablet Take 1 tablet (325 mg total) by mouth daily. For 30 days postop for DVT prophylaxis.  Resume home dose when complete. 03/26/16  Yes Prudencio Burly III, PA-C  atorvastatin (LIPITOR) 10 MG tablet  Take 10 mg by mouth daily.   Yes [provider]  baclofen (LIORESAL) 20 MG tablet Take 20 mg by mouth daily.  04/01/18  Yes [provider]  colchicine 0.6 MG tablet TAKE 1 TABLET BY MOUTH TWICE DAILY FOR GOUT FLARE-UPS Patient taking differently: Take 0.6 mg by mouth 2 (two) times daily.  09/12/14  Yes Plotnikov, Evie Lacks, MD  empagliflozin (JARDIANCE) 25 MG TABS tablet Take 25 mg by mouth daily.    Yes [provider]  glipiZIDE (GLUCOTROL) 5 MG tablet Take 5 mg by mouth 2 (two) times daily before a meal.    Yes [provider]  hydroxypropyl methylcellulose /  hypromellose (ISOPTO TEARS / GONIOVISC) 2.5 % ophthalmic solution Place 2 drops into both eyes as needed (red eyes).   Yes [provider]  levothyroxine (SYNTHROID, LEVOTHROID) 100 MCG tablet Take 100 mcg by mouth daily.  08/04/15  Yes [provider]  LINZESS 145 MCG CAPS capsule Take 1 tablet by mouth daily. 08/04/15  Yes [provider]  metoprolol succinate (TOPROL-XL) 25 MG 24 hr tablet Take 25 mg by mouth 2 (two) times daily.    Yes [provider]  oxyCODONE-acetaminophen (PERCOCET) 5-325 MG tablet Take 1 tablet by mouth every 4 (four) hours as needed. 11/28/17  Yes Milton Ferguson, MD  potassium chloride SA (K-DUR,KLOR-CON) 20 MEQ tablet Take 2 tablets (40 mEq total) by mouth 2 (two) times daily. Patient taking differently: Take 20 mEq by mouth 3 (three) times daily.  07/13/14  Yes Plotnikov, Evie Lacks, MD  predniSONE (DELTASONE) 10 MG tablet Take 5 tablets (50 mg total) by mouth daily with breakfast for 5 days. 03/31/18 04/05/18 Yes Fawze, Mina A, PA-C  sildenafil (VIAGRA) 100 MG tablet Take 100 mg by mouth as needed for erectile dysfunction.   Yes [provider]  traMADol (ULTRAM) 50 MG tablet Take 50 mg by mouth daily as needed. 03/23/18  Yes [provider]  traZODone (DESYREL) 100 MG tablet TAKE 2 TABLETS BY MOUTH EVERY NIGHT AT BEDTIME FOR SLEEP/DEPRESSION Patient taking differently: Take 200 mg by mouth at bedtime.  07/07/14  Yes Plotnikov, Evie Lacks, MD  EDARBYCLOR 40-25 MG TABS Take 1 tablet by mouth daily. Will stop 1 week prior to surgery 09/04/15   [provider]    Family History Family History  Problem Relation Age of Onset  . Coronary artery disease Mother   . Diabetes type II Father   . Hypertension Father   . Colon cancer Neg Hx   . Esophageal cancer Neg Hx   . Rectal cancer Neg Hx   . Stomach cancer Neg Hx     Social History Social History   Tobacco Use  . Smoking status: Former Smoker    Years: 5.00     Types: Cigarettes  . Smokeless tobacco: Never Used  Substance Use Topics  . Alcohol use: No  . Drug use: No    Comment: Cocaine, crack former- since 2014     Allergies   Clonidine derivatives; Shellfish allergy; Naproxen; Indomethacin; Invokana [canagliflozin]; Metformin and related; and Other   Review of Systems Review of Systems  Cardiovascular: Negative for chest pain.  Gastrointestinal: Negative for abdominal pain.  All other systems reviewed and are negative.    Physical Exam Updated Vital Signs BP (!) 169/92   Pulse (!) 53   Temp 97.6 F (36.4 C) (Oral)   Resp 16   SpO2 98%   Physical Exam  Constitutional: He appears  well-developed and well-nourished.  HENT:  Head: Normocephalic and atraumatic.  Eyes: Conjunctivae are normal.  Neck: Neck supple.  Cardiovascular: Normal rate and regular rhythm.  No murmur heard. Pulmonary/Chest: Effort normal and breath sounds normal. No respiratory distress.  Abdominal: Soft. There is no tenderness.  Musculoskeletal: He exhibits no edema.  Tender right and left hip, pain with movement,    Neurological: He is alert.  Skin: Skin is warm and dry.  Psychiatric: He has a normal mood and affect.  Nursing note and vitals reviewed.    ED Treatments / Results  Labs (all labs ordered are listed, but only abnormal results are displayed) Labs Reviewed - No data to display  EKG None  Radiology No results found.  Procedures Procedures (including critical care time)  Medications Ordered in ED Medications  ketorolac (TORADOL) injection 60 mg (60 mg Intramuscular Given 04/03/18 1526)  methylPREDNISolone sodium succinate (SOLU-MEDROL) 125 mg/2 mL injection 125 mg (125 mg Intramuscular Given 04/03/18 1529)     Initial Impression / Assessment and Plan / ED Course  I have reviewed the triage vital signs and the nursing notes.  Pertinent labs & imaging results that were available during my care of the patient were reviewed by  me and considered in my medical decision making (see chart for details).     MDM  Pt given torodol and solumedrol IM.  Pt advised to call Dr. Percell Miller to be seen for evaluation. Continue prednisone.   Final Clinical Impressions(s) / ED Diagnoses   Final diagnoses:  Pain of both hip joints    ED Discharge Orders    None    An After Visit Summary was printed and given to the patient.    Fransico Meadow, PA-C 04/03/18 2145    Francine Graven, DO 04/06/18 [phone]

## 2018-04-15 ENCOUNTER — Encounter (INDEPENDENT_AMBULATORY_CARE_PROVIDER_SITE_OTHER): Payer: Self-pay | Admitting: Orthopaedic Surgery

## 2018-04-15 ENCOUNTER — Ambulatory Visit (INDEPENDENT_AMBULATORY_CARE_PROVIDER_SITE_OTHER): Payer: Medicare HMO | Admitting: Orthopaedic Surgery

## 2018-04-15 DIAGNOSIS — M5431 Sciatica, right side: Secondary | ICD-10-CM

## 2018-04-15 DIAGNOSIS — Z96643 Presence of artificial hip joint, bilateral: Secondary | ICD-10-CM | POA: Diagnosis not present

## 2018-04-15 MED ORDER — ACETAMINOPHEN-CODEINE #3 300-30 MG PO TABS
1.0000 | ORAL_TABLET | Freq: Three times a day (TID) | ORAL | 0 refills | Status: DC | PRN
Start: 2018-04-15 — End: 2018-05-07

## 2018-04-15 NOTE — Progress Notes (Signed)
Office Visit Note   Patient: Phillip Turner           Date of Birth: April 12, 1961           MRN: [id number] Visit Date: 04/15/2018              Requested by: Nolene Ebbs, MD [address] [place],  [address] PCP: Nolene Ebbs, MD   Assessment & Plan: Visit Diagnoses:  1. History of bilateral hip replacements   2. Sciatica, right side     Plan: Based on what he describes I feel this is more of a lumbar spine issue than it is an issue with his prosthesis.  The fact that he went from July after going to emergency room and feeling terrible to feeling great until this November does not point to it being a prosthetic issue.  However I would like to obtain a three-phase bone scan to rule out prosthetic loosening and an MRI of his lumbar spine to rule out nerve compression.  In 2016 he had an MRI of his lumbar spine showing significant disc to the right at L3-L4.  He may have a recurrence of this or worsening.  Based on his weakness pain numbness and tingling and spasms I do feel is warranted to have both of these studies performed.  All question concerns were answered and addressed.  We will try some Tylenol 3 as well for pain.  He said the steroid did help but I am reluctant to put him back on a steroid right now since he was just on 1 and he is a diabetic.  We will see him back once we have the studies.  Follow-Up Instructions: Return in about 3 weeks (around 05/06/2018).   Orders:  No orders of the defined types were placed in this encounter.  Meds ordered this encounter  Medications  . acetaminophen-codeine (TYLENOL #3) 300-30 MG tablet    Sig: Take 1-2 tablets by mouth every 8 (eight) hours as needed for moderate pain.    Dispense:  40 tablet    Refill:  0      Procedures: No procedures performed   Clinical Data: No additional findings.   Subjective: Chief Complaint  Patient presents with  . Right Hip - Pain  . Left Hip - Pain  The patient is a 57 year old  gentleman who comes in for a second opinion is a trace of bilateral hip pain.  What he describes though is also sciatica and deconditioning.  He has numbness and tingling going down his right leg.  He gets cramping and spasming in both his thighs.  He does have a deep pain in the left hip groin area and thigh area.  He ambulates with a cane.  He is on with a BMI of 37.3.  At his heaviest he weighed well over 300 pounds.  He says he is around 250 now.  His hips were done by somebody else in town.  They were bilateral total hip arthroplasties done at different times through direct anterior approach.  The left hip did dislocate once after surgery and had to be reduced.  It has not dislocated since then he has not had symptoms of instability.  These hips were done in 2017.  Apparently had severe pain in July and he went to the emergency room and started on a steroid and some medications and he got significantly better.  He did not have episodes of hip pain until again in November and went back  to the emergency room.  Between July and November he had done excellent and done great.  HPI  Review of Systems He currently denies any headache, chest pain, shortness of breath, fever, chills, nausea, vomiting.  Objective: Vital Signs: There were no vitals taken for this visit.  Physical Exam He is alert and orient x3 and in no acute distress Ortho Exam Examination shows that he is a moderately obese to morbidly obese individual.  Both hips move fluidly and well located.  He has global tenderness around his left hip.  He has subjective numbness in the L4 and L5 distribution on the right side.  He is definitely deconditioned with weak quads bilaterally.  He has a positive straight leg raise bilaterally. Specialty Comments:  No specialty comments available.  Imaging: No results found. X-rays on the canopy system showing both hips show total hip arthroplasties with no gross complicating features.  There is some  slight lucency around the acetabular component this may be more cystic changes.  I do not see lucency around the screw itself and this correlates with CT scan that was done in July of his pelvis and abdomen that did show the top of the hips in terms of the prosthesis.  PMFS History: Patient Active Problem List   Diagnosis Date Noted  . Primary osteoarthritis of right hip 03/04/2016  . Primary osteoarthritis of left hip 10/17/2015  . CVA (cerebral infarction) 09/08/2013  . HTN (hypertension) 03/12/2012  . Major depression, recurrent ([place]) 03/10/2012  . Anxiety disorder 03/10/2012  . Leukocytosis 03/08/2012  . Hyponatremia 03/08/2012  . DM (diabetes mellitus), type 2, uncontrolled with complications ([place]) 96/29/5284  . Hypothyroidism 03/29/2011  . Depression 03/27/2011  . H/O 03/27/2011  . Memory impairment 03/27/2011  . Alcohol dependence ([place]) 03/26/2011  . 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 Cocaine abuse ([place]) 03/26/2011   Past Medical History:  Diagnosis Date  . Anxiety    from past drug use not longer having anxiety  . Arthritis   . Depression    past depression relating to drug use   . Diabetes mellitus   . Gout   . Hyperlipidemia   . Hypertension   . Hypothyroidism   . Rheumatic fever    as a teenager  . 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 Stroke [facility])    does not remember when, weakness on right side, some trouble speaking at times  . Substance abuse ([place])    former cocaine user- not since March 2014    Family History  Problem Relation Age of Onset  . Coronary artery disease Mother   . Diabetes type II Father   . Hypertension Father   . Colon cancer Neg Hx   . Esophageal cancer Neg Hx   . Rectal cancer Neg Hx   . Stomach cancer Neg Hx     Past Surgical History:  Procedure Laterality Date  . boil removed from tailbone     . COLONOSCOPY    . TONSILLECTOMY    . TOTAL HIP ARTHROPLASTY Left 11/14/2015   Procedure: LEFT TOTAL HIP  ARTHROPLASTY ANTERIOR APPROACH;  Surgeon: Renette Butters, MD;  Location: [facility];  Service: Orthopedics;  Laterality: Left;  . TOTAL HIP ARTHROPLASTY Right 03/26/2016   Procedure: RIGHT TOTAL HIP ARTHROPLASTY ANTERIOR APPROACH;  Surgeon: Renette Butters, MD;  Location: [facility];  Service: Orthopedics;  Laterality: Right;   Social History   Occupational History  . Not on file  Tobacco  Use  . Smoking status: Former Smoker    Years: 5.00    Types: Cigarettes  . Smokeless tobacco: Never Used  Substance and Sexual Activity  . Alcohol use: No  . Drug use: No    Comment: Cocaine, crack former- since 2014  . Sexual activity: Yes

## 2018-04-16 ENCOUNTER — Other Ambulatory Visit (INDEPENDENT_AMBULATORY_CARE_PROVIDER_SITE_OTHER): Payer: Self-pay

## 2018-04-16 DIAGNOSIS — M4807 Spinal stenosis, lumbosacral region: Secondary | ICD-10-CM

## 2018-04-16 DIAGNOSIS — Z96643 Presence of artificial hip joint, bilateral: Secondary | ICD-10-CM

## 2018-04-23 ENCOUNTER — Encounter (HOSPITAL_COMMUNITY)
Admission: RE | Admit: 2018-04-23 | Discharge: 2018-04-23 | Disposition: A | Discharge status: Home / Self Care | Payer: Medicare HMO | Source: Ambulatory Visit | Attending: Orthopaedic Surgery | Admitting: Orthopaedic Surgery

## 2018-04-23 ENCOUNTER — Ambulatory Visit (HOSPITAL_COMMUNITY)
Admission: RE | Admit: 2018-04-23 | Discharge: 2018-04-23 | Disposition: A | Discharge status: Home / Self Care | Payer: Medicare HMO | Source: Ambulatory Visit | Attending: Orthopaedic Surgery | Admitting: Orthopaedic Surgery

## 2018-04-23 DIAGNOSIS — Z96643 Presence of artificial hip joint, bilateral: Secondary | ICD-10-CM | POA: Diagnosis present

## 2018-04-23 MED ORDER — TECHNETIUM TC 99M MEDRONATE IV KIT: 20.0000 | PACK | Freq: Once | INTRAVENOUS | Status: DC | PRN

## 2018-04-25 ENCOUNTER — Ambulatory Visit
Admission: RE | Admit: 2018-04-25 | Discharge: 2018-04-25 | Disposition: A | Discharge status: Home / Self Care | Payer: Medicare HMO | Source: Ambulatory Visit | Attending: Orthopaedic Surgery | Admitting: Orthopaedic Surgery

## 2018-04-25 DIAGNOSIS — M4807 Spinal stenosis, lumbosacral region: Secondary | ICD-10-CM

## 2018-05-06 HISTORY — PX: BACK SURGERY: SHX140

## 2018-05-07 ENCOUNTER — Encounter (INDEPENDENT_AMBULATORY_CARE_PROVIDER_SITE_OTHER): Payer: Self-pay | Admitting: Orthopaedic Surgery

## 2018-05-07 ENCOUNTER — Other Ambulatory Visit (INDEPENDENT_AMBULATORY_CARE_PROVIDER_SITE_OTHER): Payer: Self-pay

## 2018-05-07 ENCOUNTER — Ambulatory Visit (INDEPENDENT_AMBULATORY_CARE_PROVIDER_SITE_OTHER): Payer: Medicare HMO | Admitting: Orthopaedic Surgery

## 2018-05-07 DIAGNOSIS — M545 Low back pain, unspecified: Secondary | ICD-10-CM

## 2018-05-07 DIAGNOSIS — Z96643 Presence of artificial hip joint, bilateral: Secondary | ICD-10-CM | POA: Diagnosis not present

## 2018-05-07 DIAGNOSIS — G8929 Other chronic pain: Secondary | ICD-10-CM

## 2018-05-07 DIAGNOSIS — M5126 Other intervertebral disc displacement, lumbar region: Secondary | ICD-10-CM | POA: Diagnosis not present

## 2018-05-07 MED ORDER — ACETAMINOPHEN-CODEINE #3 300-30 MG PO TABS: 1.0000 | ORAL_TABLET | Freq: Three times a day (TID) | ORAL | 0 refills | Status: DC | PRN

## 2018-05-07 NOTE — Progress Notes (Signed)
The patient is returning today to go over an MRI of his lumbar spine as well as a three-phase bone scan to assess his bilateral hip replacements.  His hips are replaced by another surgeon in town.  However the pain he is describing goes down his lumbar spine in the sciatic region and radiates down both legs.  It is much worse on the left than the right.  On plain films I felt that his hip replacements were stable and on exam the hip placements are stable.  He still has significant and low back pain a positive straight leg raise more the right than the left.  His hips still hurt quite a bit.  The three-phase bone scan did not show any evidence of loosening or complicating features of the hip replacement.  The MRI does show a large herniated disc at the L1-L2 level with a disc fragment that is large.  There is also a significant paracentral disc at L3-L4.  Certainly these things can be contributing to the pain that he is having.  He still denies any bowel bladder function changes or any severe weakness.  Is mainly just pain that he has radiating down both his legs that is become frustrating for him.  At this point I would like to send him to 1 of the neurosurgeons in town to evaluate his lumbar spine for the potential for surgery versus the need for injections.  I did give him a copy of his MRI report.  All question concerns were answered addressed.  I gave him reassurance that his hip replacements have no complicating features.  Follow-up with our office will be as needed.  I did send in a one-time prescription for Tylenol with codeine.

## 2018-05-11 ENCOUNTER — Ambulatory Visit (INDEPENDENT_AMBULATORY_CARE_PROVIDER_SITE_OTHER): Payer: Medicare HMO | Admitting: Orthopaedic Surgery

## 2018-10-21 ENCOUNTER — Other Ambulatory Visit: Payer: Self-pay | Admitting: Student

## 2018-10-21 DIAGNOSIS — M5416 Radiculopathy, lumbar region: Secondary | ICD-10-CM

## 2018-11-12 ENCOUNTER — Ambulatory Visit
Admission: RE | Admit: 2018-11-12 | Discharge: 2018-11-12 | Disposition: A | Discharge status: Home / Self Care | Payer: Medicare HMO | Source: Ambulatory Visit | Attending: Student | Admitting: Student

## 2018-11-12 ENCOUNTER — Other Ambulatory Visit: Payer: Self-pay

## 2018-11-12 DIAGNOSIS — M5416 Radiculopathy, lumbar region: Secondary | ICD-10-CM

## 2018-11-12 MED ORDER — GADOBENATE DIMEGLUMINE 529 MG/ML IV SOLN
20.0000 mL | Freq: Once | INTRAVENOUS | Status: AC | PRN
  Administered 2018-11-12: 20 mL via INTRAVENOUS

## 2018-11-17 ENCOUNTER — Other Ambulatory Visit: Payer: Self-pay | Admitting: Neurosurgery

## 2018-11-23 NOTE — Pre-Procedure Instructions (Signed)
[facility] [place], [address] AT [address] [place] [address] Phone: [phone] Fax: [phone]      Your procedure is scheduled on Friday July 24th.  Report to [facility] Main Entrance "A" at 10:15 A.M., and check in at the Admitting office.  Call this number if you have problems the morning of surgery:  [phone]  Call [phone] if you have any questions prior to your surgery date Monday-Friday 8am-4pm    Remember:  Do not eat or drink after midnight the night before your surgery     Take these medicines the morning of surgery with A SIP OF WATER  amLODipine (NORVASC)  colchicine  levothyroxine (SYNTHROID, LEVOTHROID) metoprolol succinate (TOPROL-XL) acetaminophen (TYLENOL) - if needed baclofen (LIORESAL)- if needed traMADol (ULTRAM)- if needed   HOW TO Dresser  Why is it important to control my blood sugar before and after surgery? . Improving blood sugar levels before and after surgery helps healing and can limit problems. . A way of improving blood sugar control is eating a healthy diet by: o  Eating less sugar and carbohydrates o  Increasing activity/exercise o  Talking with your doctor about reaching your blood sugar goals . High blood sugars (greater than 180 mg/dL) can raise your risk of infections and slow your recovery, so you will need to focus on controlling your diabetes during the weeks before surgery. . Make sure that the doctor who takes care of your diabetes knows about your planned surgery including the date and location.  How do I manage my blood sugar before surgery? . Check your blood sugar at least 4 times a day, starting 2 days before surgery, to make sure that the level is not too high or low. o Check your blood sugar the morning of your surgery when you wake up and every 2 hours until you get to the Short Stay  unit. . If your blood sugar is less than 70 mg/dL, you will need to treat for low blood sugar: o Do not take insulin. o Treat a low blood sugar (less than 70 mg/dL) with  cup of clear juice (cranberry or apple), 4 glucose tablets, OR glucose gel. Recheck blood sugar in 15 minutes after treatment (to make sure it is greater than 70 mg/dL). If your blood sugar is not greater than 70 mg/dL on recheck, call [phone] o  for further instructions. . Report your blood sugar to the short stay nurse when you get to Short Stay.  . If you are admitted to the hospital after surgery: o Your blood sugar will be checked by the staff and you will probably be given insulin after surgery (instead of oral diabetes medicines) to make sure you have good blood sugar levels. o The goal for blood sugar control after surgery is 80-180 mg/dL.     WHAT DO I DO ABOUT MY DIABETES MEDICATION?   Marland Kitchen Do not take oral diabetes medicines (pills): empagliflozin (JARDIANCE) or glipiZIDE (GLUCOTROL)  the morning of surgery.  . THE NIGHT BEFORE SURGERY, do NOT take evening dose of glipiZIDE (GLUCOTROL)     *     THE DAY BEFORE SUGERY- do NOT take empagliflozin (JARDIANCE   As of today, STOP taking any Aspirin (unless otherwise instructed by your surgeon), Aleve, Naproxen, Ibuprofen, Motrin, Advil, Goody's, BC's, all herbal medications, fish oil, and all vitamins.    The Morning  of Surgery  Do not wear jewelry, make-up or nail polish.  Do not wear lotions, powders, or perfumes/colognes, or deodorant  Do not shave 48 hours prior to surgery.  Men may shave face and neck.  Do not bring valuables to the hospital.  [facility] is not responsible for any belongings or valuables.  If you are a smoker, DO NOT Smoke 24 hours prior to surgery IF you wear a CPAP at night please bring your mask, tubing, and machine the morning of surgery   Remember that you must have someone to transport you home after your surgery, and remain  with you for 24 hours if you are discharged the same day.   Contacts, glasses, hearing aids, dentures or bridgework may not be worn into surgery.    Leave your suitcase in the car.  After surgery it may be brought to your room.  For patients admitted to the hospital, discharge time will be determined by your treatment team.  Patients discharged the day of surgery will not be allowed to drive home.    Special instructions:   [facility]- Preparing For Surgery  Before surgery, you can play an important role. Because skin is not sterile, your skin needs to be as free of germs as possible. You can reduce the number of germs on your skin by washing with CHG (chlorahexidine gluconate) Soap before surgery.  CHG is an antiseptic cleaner which kills germs and bonds with the skin to continue killing germs even after washing.    Oral Hygiene is also important to reduce your risk of infection.  Remember - BRUSH YOUR TEETH THE MORNING OF SURGERY WITH YOUR REGULAR TOOTHPASTE  Please do not use if you have an allergy to CHG or antibacterial soaps. If your skin becomes reddened/irritated stop using the CHG.  Do not shave (including legs and underarms) for at least 48 hours prior to first CHG shower. It is OK to shave your face.  Please follow these instructions carefully.   1. Shower the NIGHT BEFORE SURGERY and the MORNING OF SURGERY with CHG Soap.   2. If you chose to wash your hair, wash your hair first as usual with your normal shampoo.  3. After you shampoo, rinse your hair and body thoroughly to remove the shampoo.  4. Use CHG as you would any other liquid soap. You can apply CHG directly to the skin and wash gently with a scrungie or a clean washcloth.   5. Apply the CHG Soap to your body ONLY FROM THE NECK DOWN.  Do not use on open wounds or open sores. Avoid contact with your eyes, ears, mouth and genitals (private parts). Wash Face and genitals (private parts)  with your normal soap.    6. Wash thoroughly, paying special attention to the area where your surgery will be performed.  7. Thoroughly rinse your body with warm water from the neck down.  8. DO NOT shower/wash with your normal soap after using and rinsing off the CHG Soap.  9. Pat yourself dry with a CLEAN TOWEL.  10. Wear CLEAN PAJAMAS to bed the night before surgery, wear comfortable clothes the morning of surgery  11. Place CLEAN SHEETS on your bed the night of your first shower and DO NOT SLEEP WITH PETS.    Day of Surgery:  Do not apply any deodorants/lotions. Please shower the morning of surgery with the CHG soap  Please wear clean clothes to the hospital/surgery center.   Remember to brush  your teeth WITH YOUR REGULAR TOOTHPASTE.   Please read over the following fact sheets that you were given.

## 2018-11-24 ENCOUNTER — Other Ambulatory Visit (HOSPITAL_COMMUNITY)
Admission: RE | Admit: 2018-11-24 | Discharge: 2018-11-24 | Disposition: A | Discharge status: Home / Self Care | Payer: Medicare HMO | Source: Ambulatory Visit | Attending: Neurosurgery | Admitting: Neurosurgery

## 2018-11-24 ENCOUNTER — Encounter (HOSPITAL_COMMUNITY)
Admission: RE | Admit: 2018-11-24 | Discharge: 2018-11-24 | Disposition: A | Discharge status: Home / Self Care | Payer: Medicare HMO | Source: Ambulatory Visit | Attending: Neurosurgery | Admitting: Neurosurgery

## 2018-11-24 ENCOUNTER — Encounter (HOSPITAL_COMMUNITY): Payer: Self-pay

## 2018-11-24 ENCOUNTER — Other Ambulatory Visit: Payer: Self-pay

## 2018-11-24 DIAGNOSIS — Z792 Long term (current) use of antibiotics: Secondary | ICD-10-CM | POA: Diagnosis not present

## 2018-11-24 DIAGNOSIS — M109 Gout, unspecified: Secondary | ICD-10-CM | POA: Diagnosis not present

## 2018-11-24 DIAGNOSIS — E785 Hyperlipidemia, unspecified: Secondary | ICD-10-CM | POA: Diagnosis not present

## 2018-11-24 DIAGNOSIS — I10 Essential (primary) hypertension: Secondary | ICD-10-CM | POA: Insufficient documentation

## 2018-11-24 DIAGNOSIS — I69351 Hemiplegia and hemiparesis following cerebral infarction affecting right dominant side: Secondary | ICD-10-CM | POA: Diagnosis not present

## 2018-11-24 DIAGNOSIS — Z87891 Personal history of nicotine dependence: Secondary | ICD-10-CM | POA: Diagnosis not present

## 2018-11-24 DIAGNOSIS — Z1159 Encounter for screening for other viral diseases: Secondary | ICD-10-CM | POA: Diagnosis not present

## 2018-11-24 DIAGNOSIS — M1990 Unspecified osteoarthritis, unspecified site: Secondary | ICD-10-CM | POA: Diagnosis not present

## 2018-11-24 DIAGNOSIS — E039 Hypothyroidism, unspecified: Secondary | ICD-10-CM | POA: Diagnosis not present

## 2018-11-24 DIAGNOSIS — E119 Type 2 diabetes mellitus without complications: Secondary | ICD-10-CM | POA: Insufficient documentation

## 2018-11-24 DIAGNOSIS — Z888 Allergy status to other drugs, medicaments and biological substances status: Secondary | ICD-10-CM | POA: Diagnosis not present

## 2018-11-24 DIAGNOSIS — R9431 Abnormal electrocardiogram [ECG] [EKG]: Secondary | ICD-10-CM | POA: Insufficient documentation

## 2018-11-24 DIAGNOSIS — Z79890 Hormone replacement therapy (postmenopausal): Secondary | ICD-10-CM | POA: Diagnosis not present

## 2018-11-24 DIAGNOSIS — Z7984 Long term (current) use of oral hypoglycemic drugs: Secondary | ICD-10-CM | POA: Diagnosis not present

## 2018-11-24 DIAGNOSIS — Z7982 Long term (current) use of aspirin: Secondary | ICD-10-CM | POA: Diagnosis not present

## 2018-11-24 DIAGNOSIS — Z79899 Other long term (current) drug therapy: Secondary | ICD-10-CM | POA: Diagnosis not present

## 2018-11-24 DIAGNOSIS — M5126 Other intervertebral disc displacement, lumbar region: Secondary | ICD-10-CM | POA: Diagnosis present

## 2018-11-24 DIAGNOSIS — Z20828 Contact with and (suspected) exposure to other viral communicable diseases: Secondary | ICD-10-CM | POA: Insufficient documentation

## 2018-11-24 DIAGNOSIS — Z886 Allergy status to analgesic agent status: Secondary | ICD-10-CM | POA: Diagnosis not present

## 2018-11-24 DIAGNOSIS — R001 Bradycardia, unspecified: Secondary | ICD-10-CM | POA: Insufficient documentation

## 2018-11-24 DIAGNOSIS — Z96643 Presence of artificial hip joint, bilateral: Secondary | ICD-10-CM | POA: Diagnosis not present

## 2018-11-24 DIAGNOSIS — Z01818 Encounter for other preprocedural examination: Secondary | ICD-10-CM | POA: Insufficient documentation

## 2018-11-24 LAB — BASIC METABOLIC PANEL
Anion gap: 9 (ref 5–15)
BUN: 10 mg/dL (ref 6–20)
CO2: 26 mmol/L (ref 22–32)
Calcium: 9.3 mg/dL (ref 8.9–10.3)
Chloride: 105 mmol/L (ref 98–111)
Creatinine, Ser: 1.45 mg/dL — ABNORMAL HIGH (ref 0.61–1.24)
GFR calc Af Amer: >60 mL/min (ref >60)
GFR calc non Af Amer: 53 mL/min — ABNORMAL LOW (ref >60)
Glucose, Bld: 105 mg/dL — ABNORMAL HIGH (ref 70–99)
Potassium: 3.3 mmol/L — ABNORMAL LOW (ref 3.5–5.1)
Sodium: 140 mmol/L (ref 135–145)

## 2018-11-24 LAB — CBC
HCT: 45.2 % (ref 39.0–52.0)
Hemoglobin: 14.6 g/dL (ref 13.0–17.0)
MCH: 26.1 pg (ref 26.0–34.0)
MCHC: 32.3 g/dL (ref 30.0–36.0)
MCV: 80.7 fL (ref 80.0–100.0)
Platelets: 257 10*3/uL (ref 150–400)
RBC: 5.6 MIL/uL (ref 4.22–5.81)
RDW: 16.9 % — ABNORMAL HIGH (ref 11.5–15.5)
WBC: 7.6 10*3/uL (ref 4.0–10.5)
nRBC: 0 % (ref 0.0–0.2)

## 2018-11-24 LAB — SARS CORONAVIRUS 2 (TAT 6-24 HRS): SARS Coronavirus 2: NEGATIVE

## 2018-11-24 LAB — SURGICAL PCR SCREEN
MRSA, PCR: NEGATIVE
Staphylococcus aureus: NEGATIVE

## 2018-11-24 LAB — HEMOGLOBIN A1C
Hgb A1c MFr Bld: 5.8 % — ABNORMAL HIGH (ref 4.8–5.6)
Mean Plasma Glucose: 119.76 mg/dL

## 2018-11-24 LAB — NO BLOOD PRODUCTS

## 2018-11-24 LAB — GLUCOSE, CAPILLARY: Glucose-Capillary: 72 mg/dL (ref 70–99)

## 2018-11-24 NOTE — Progress Notes (Addendum)
PCP - Dr. Nolene Ebbs Cardiologist - Dr. Johnsie Cancel  Chest x-ray - N/A EKG - 11/24/18 Stress Test - unsure of when/where ECHO - denies Cardiac Cath - denies  Sleep Study - denies   Fasting Blood Sugar - 80's Checks Blood Sugar 3 times daily   Aspirin Instructions: Patient instructed to hold all Aspirin, NSAID's, herbal medications, fish oil and vitamins 7 days prior to surgery.   Anesthesia review: abnormal EKG  Patient denies shortness of breath, fever, cough and chest pain at PAT appointment   Patient verbalized understanding of instructions that were given to them at the PAT appointment. Patient was also instructed that they will need to review over the PAT instructions again at home before surgery.

## 2018-11-25 NOTE — Anesthesia Preprocedure Evaluation (Addendum)
Anesthesia Evaluation  Patient identified by MRN, date of birth, ID band Patient awake    Reviewed: Allergy & Precautions, NPO status , Patient's Chart, lab work & pertinent test results  Airway Mallampati: III  TM Distance: >3 FB     Dental  (+)    Pulmonary former smoker,    breath sounds clear to auscultation       Cardiovascular hypertension,  Rhythm:Regular Rate:Normal     Neuro/Psych    GI/Hepatic   Endo/Other  diabetes  Renal/GU      Musculoskeletal   Abdominal   Peds  Hematology   Anesthesia Other Findings   Reproductive/Obstetrics                            Anesthesia Physical Anesthesia Plan  ASA: III  Anesthesia Plan: General   Post-op Pain Management:    Induction: Intravenous  PONV Risk Score and Plan: Ondansetron  Airway Management Planned: Oral ETT  Additional Equipment:   Intra-op Plan:   Post-operative Plan: Extubation in OR  Informed Consent: I have reviewed the patients History and Physical, chart, labs and discussed the procedure including the risks, benefits and alternatives for the proposed anesthesia with the patient or authorized representative who has indicated his/her understanding and acceptance.     Dental advisory given  Plan Discussed with: CRNA and Anesthesiologist  Anesthesia Plan Comments: (Evaluated by cardiology in 2017 prior to THR due to abn EKG and for risk stratification. Per note "The patient denies any cardiac symptoms. Admits "intermittent skipped beats" after educating about PVCs.  No changes. Reviewed EKG with DOD Dr. Johnsie Cancel who read EKG from today same as it was 09/20/15. He had L total hip afterwards. Asymptomatic. His is revised cardiac risk score is 0.9% and is low risk for cardiac complication for surgery." He was advised to f/u with cardiology PRN.   Pt refuses blood products - Je[employer]  Cr 1.45  Loose L upper  molar and R. Lower molar.  Plan GA with oral ETT  [facility])       Anesthesia Quick Evaluation

## 2018-11-27 ENCOUNTER — Ambulatory Visit (HOSPITAL_COMMUNITY): Payer: Medicare HMO

## 2018-11-27 ENCOUNTER — Ambulatory Visit (HOSPITAL_COMMUNITY): Payer: Medicare HMO | Admitting: Physician Assistant

## 2018-11-27 ENCOUNTER — Ambulatory Visit (HOSPITAL_COMMUNITY): Payer: Medicare HMO | Admitting: Registered Nurse

## 2018-11-27 ENCOUNTER — Other Ambulatory Visit: Payer: Self-pay

## 2018-11-27 ENCOUNTER — Ambulatory Visit (HOSPITAL_COMMUNITY)
Admission: RE | Admit: 2018-11-27 | Discharge: 2018-11-27 | Disposition: A | Discharge status: Home / Self Care | Payer: Medicare HMO | Attending: Neurosurgery | Admitting: Neurosurgery

## 2018-11-27 ENCOUNTER — Encounter (HOSPITAL_COMMUNITY): Payer: Self-pay | Admitting: Registered Nurse

## 2018-11-27 ENCOUNTER — Encounter (HOSPITAL_COMMUNITY)
Admission: RE | Disposition: A | Discharge status: Home / Self Care | Payer: Self-pay | Source: Home / Self Care | Attending: Neurosurgery

## 2018-11-27 DIAGNOSIS — I69351 Hemiplegia and hemiparesis following cerebral infarction affecting right dominant side: Secondary | ICD-10-CM | POA: Insufficient documentation

## 2018-11-27 DIAGNOSIS — E785 Hyperlipidemia, unspecified: Secondary | ICD-10-CM | POA: Insufficient documentation

## 2018-11-27 DIAGNOSIS — Z419 Encounter for procedure for purposes other than remedying health state, unspecified: Secondary | ICD-10-CM

## 2018-11-27 DIAGNOSIS — Z886 Allergy status to analgesic agent status: Secondary | ICD-10-CM | POA: Insufficient documentation

## 2018-11-27 DIAGNOSIS — Z79890 Hormone replacement therapy (postmenopausal): Secondary | ICD-10-CM | POA: Insufficient documentation

## 2018-11-27 DIAGNOSIS — M1990 Unspecified osteoarthritis, unspecified site: Secondary | ICD-10-CM | POA: Insufficient documentation

## 2018-11-27 DIAGNOSIS — Z87891 Personal history of nicotine dependence: Secondary | ICD-10-CM | POA: Insufficient documentation

## 2018-11-27 DIAGNOSIS — E119 Type 2 diabetes mellitus without complications: Secondary | ICD-10-CM | POA: Insufficient documentation

## 2018-11-27 DIAGNOSIS — M109 Gout, unspecified: Secondary | ICD-10-CM | POA: Insufficient documentation

## 2018-11-27 DIAGNOSIS — I10 Essential (primary) hypertension: Secondary | ICD-10-CM | POA: Insufficient documentation

## 2018-11-27 DIAGNOSIS — Z888 Allergy status to other drugs, medicaments and biological substances status: Secondary | ICD-10-CM | POA: Insufficient documentation

## 2018-11-27 DIAGNOSIS — Z7984 Long term (current) use of oral hypoglycemic drugs: Secondary | ICD-10-CM | POA: Insufficient documentation

## 2018-11-27 DIAGNOSIS — Z7982 Long term (current) use of aspirin: Secondary | ICD-10-CM | POA: Insufficient documentation

## 2018-11-27 DIAGNOSIS — Z79899 Other long term (current) drug therapy: Secondary | ICD-10-CM | POA: Insufficient documentation

## 2018-11-27 DIAGNOSIS — M5126 Other intervertebral disc displacement, lumbar region: Secondary | ICD-10-CM | POA: Insufficient documentation

## 2018-11-27 DIAGNOSIS — Z792 Long term (current) use of antibiotics: Secondary | ICD-10-CM | POA: Insufficient documentation

## 2018-11-27 DIAGNOSIS — Z1159 Encounter for screening for other viral diseases: Secondary | ICD-10-CM | POA: Insufficient documentation

## 2018-11-27 DIAGNOSIS — E039 Hypothyroidism, unspecified: Secondary | ICD-10-CM | POA: Insufficient documentation

## 2018-11-27 DIAGNOSIS — Z96643 Presence of artificial hip joint, bilateral: Secondary | ICD-10-CM | POA: Insufficient documentation

## 2018-11-27 HISTORY — PX: LUMBAR LAMINECTOMY/DECOMPRESSION MICRODISCECTOMY: SHX5026

## 2018-11-27 LAB — GLUCOSE, CAPILLARY
Glucose-Capillary: 83 mg/dL (ref 70–99)
Glucose-Capillary: 80 mg/dL (ref 70–99)
Glucose-Capillary: 126 mg/dL — ABNORMAL HIGH (ref 70–99)
Glucose-Capillary: 103 mg/dL — ABNORMAL HIGH (ref 70–99)

## 2018-11-27 SURGERY — LUMBAR LAMINECTOMY/DECOMPRESSION MICRODISCECTOMY 2 LEVELS
Anesthesia: General | Site: Back

## 2018-11-27 MED ORDER — GLYCOPYRROLATE PF 0.2 MG/ML IJ SOSY
PREFILLED_SYRINGE | INTRAMUSCULAR | Status: DC | PRN
  Administered 2018-11-27: .2 mg via INTRAVENOUS

## 2018-11-27 MED ORDER — TRAZODONE HCL 100 MG PO TABS
200.0000 mg | ORAL_TABLET | Freq: Every day | ORAL | Status: DC
  Filled 2018-11-27: qty 2

## 2018-11-27 MED ORDER — DOCUSATE SODIUM 100 MG PO CAPS: 100.0000 mg | ORAL_CAPSULE | Freq: Two times a day (BID) | ORAL | 2 refills | Status: AC

## 2018-11-27 MED ORDER — EPHEDRINE SULFATE-NACL 50-0.9 MG/10ML-% IV SOSY
PREFILLED_SYRINGE | INTRAVENOUS | Status: DC | PRN
  Administered 2018-11-27 (×5): 10 mg via INTRAVENOUS

## 2018-11-27 MED ORDER — HEMOSTATIC AGENTS (NO CHARGE) OPTIME
TOPICAL | Status: DC | PRN
  Administered 2018-11-27: 1 via TOPICAL

## 2018-11-27 MED ORDER — ACETAMINOPHEN 325 MG PO TABS
650.0000 mg | ORAL_TABLET | ORAL | Status: DC | PRN
  Administered 2018-11-27: 650 mg via ORAL
  Filled 2018-11-27: qty 2

## 2018-11-27 MED ORDER — COLCHICINE 0.6 MG PO TABS
0.6000 mg | ORAL_TABLET | Freq: Two times a day (BID) | ORAL | Status: DC
  Filled 2018-11-27: qty 1

## 2018-11-27 MED ORDER — DEXTROSE 50 % IV SOLN
INTRAVENOUS | Status: DC | PRN
  Administered 2018-11-27: 12.5 g via INTRAVENOUS

## 2018-11-27 MED ORDER — SUGAMMADEX SODIUM 200 MG/2ML IV SOLN
INTRAVENOUS | Status: DC | PRN
  Administered 2018-11-27: 250 mg via INTRAVENOUS

## 2018-11-27 MED ORDER — MENTHOL 3 MG MT LOZG: 1.0000 | LOZENGE | OROMUCOSAL | Status: DC | PRN

## 2018-11-27 MED ORDER — FENTANYL CITRATE (PF) 250 MCG/5ML IJ SOLN
INTRAMUSCULAR | Status: AC
  Filled 2018-11-27: qty 5

## 2018-11-27 MED ORDER — OXYCODONE HCL 5 MG/5ML PO SOLN: 5.0000 mg | Freq: Once | ORAL | Status: AC | PRN

## 2018-11-27 MED ORDER — PROPOFOL 10 MG/ML IV BOLUS
INTRAVENOUS | Status: AC
  Filled 2018-11-27: qty 40

## 2018-11-27 MED ORDER — PHENOL 1.4 % MT LIQD: 1.0000 | OROMUCOSAL | Status: DC | PRN

## 2018-11-27 MED ORDER — HYPROMELLOSE (GONIOSCOPIC) 2.5 % OP SOLN: 2.0000 [drp] | OPHTHALMIC | Status: DC | PRN

## 2018-11-27 MED ORDER — PROPOFOL 10 MG/ML IV BOLUS
INTRAVENOUS | Status: DC | PRN
  Administered 2018-11-27: 170 mg via INTRAVENOUS

## 2018-11-27 MED ORDER — AMLODIPINE BESYLATE 5 MG PO TABS: 10.0000 mg | ORAL_TABLET | Freq: Every day | ORAL | Status: DC

## 2018-11-27 MED ORDER — CEFAZOLIN SODIUM-DEXTROSE 2-4 GM/100ML-% IV SOLN
2.0000 g | Freq: Three times a day (TID) | INTRAVENOUS | Status: DC
  Administered 2018-11-27: 2 g via INTRAVENOUS
  Filled 2018-11-27: qty 100

## 2018-11-27 MED ORDER — BUPIVACAINE HCL (PF) 0.25 % IJ SOLN
INTRAMUSCULAR | Status: DC | PRN
  Administered 2018-11-27: 10 mL

## 2018-11-27 MED ORDER — ARTIFICIAL TEARS OPHTHALMIC OINT
TOPICAL_OINTMENT | OPHTHALMIC | Status: DC | PRN
  Administered 2018-11-27: 1 via OPHTHALMIC

## 2018-11-27 MED ORDER — OXYCODONE HCL 5 MG PO TABS
5.0000 mg | ORAL_TABLET | Freq: Once | ORAL | Status: AC | PRN
  Administered 2018-11-27: 5 mg via ORAL

## 2018-11-27 MED ORDER — FENTANYL CITRATE (PF) 100 MCG/2ML IJ SOLN
25.0000 ug | INTRAMUSCULAR | Status: DC | PRN
  Administered 2018-11-27: 25 ug via INTRAVENOUS

## 2018-11-27 MED ORDER — BUPIVACAINE HCL (PF) 0.25 % IJ SOLN
INTRAMUSCULAR | Status: AC
  Filled 2018-11-27: qty 30

## 2018-11-27 MED ORDER — ALUM & MAG HYDROXIDE-SIMETH 200-200-20 MG/5ML PO SUSP: 30.0000 mL | Freq: Four times a day (QID) | ORAL | Status: DC | PRN

## 2018-11-27 MED ORDER — VITAMIN D (ERGOCALCIFEROL) 1.25 MG (50000 UNIT) PO CAPS: 50000.0000 [IU] | ORAL_CAPSULE | ORAL | Status: DC

## 2018-11-27 MED ORDER — LIDOCAINE-EPINEPHRINE 1 %-1:100000 IJ SOLN
INTRAMUSCULAR | Status: DC | PRN
  Administered 2018-11-27: 10 mL

## 2018-11-27 MED ORDER — BACLOFEN 20 MG PO TABS
20.0000 mg | ORAL_TABLET | Freq: Two times a day (BID) | ORAL | Status: DC
  Filled 2018-11-27: qty 1

## 2018-11-27 MED ORDER — SODIUM CHLORIDE 0.9% FLUSH: 3.0000 mL | Freq: Two times a day (BID) | INTRAVENOUS | Status: DC

## 2018-11-27 MED ORDER — ROCURONIUM BROMIDE 10 MG/ML (PF) SYRINGE
PREFILLED_SYRINGE | INTRAVENOUS | Status: DC | PRN
  Administered 2018-11-27: 20 mg via INTRAVENOUS
  Administered 2018-11-27: 60 mg via INTRAVENOUS
  Administered 2018-11-27: 20 mg via INTRAVENOUS

## 2018-11-27 MED ORDER — 0.9 % SODIUM CHLORIDE (POUR BTL) OPTIME
TOPICAL | Status: DC | PRN
  Administered 2018-11-27: 1000 mL

## 2018-11-27 MED ORDER — ONDANSETRON HCL 4 MG/2ML IJ SOLN: 4.0000 mg | Freq: Once | INTRAMUSCULAR | Status: DC | PRN

## 2018-11-27 MED ORDER — ACETAMINOPHEN 10 MG/ML IV SOLN
INTRAVENOUS | Status: DC | PRN
  Administered 2018-11-27: 1000 mg via INTRAVENOUS

## 2018-11-27 MED ORDER — CEFAZOLIN SODIUM-DEXTROSE 2-4 GM/100ML-% IV SOLN
2.0000 g | INTRAVENOUS | Status: AC
  Administered 2018-11-27: 13:00:00 2 g via INTRAVENOUS

## 2018-11-27 MED ORDER — LACTATED RINGERS IV SOLN
INTRAVENOUS | Status: DC | PRN
  Administered 2018-11-27 (×2): via INTRAVENOUS

## 2018-11-27 MED ORDER — DULAGLUTIDE 0.75 MG/0.5ML ~~LOC~~ SOAJ: 0.7500 mg | SUBCUTANEOUS | Status: DC

## 2018-11-27 MED ORDER — DEXAMETHASONE SODIUM PHOSPHATE 10 MG/ML IJ SOLN
INTRAMUSCULAR | Status: DC | PRN
  Administered 2018-11-27: 10 mg via INTRAVENOUS

## 2018-11-27 MED ORDER — HYDROMORPHONE HCL 1 MG/ML IJ SOLN
INTRAMUSCULAR | Status: DC | PRN
  Administered 2018-11-27: 0.5 mg via INTRAVENOUS

## 2018-11-27 MED ORDER — ONDANSETRON HCL 4 MG PO TABS: 4.0000 mg | ORAL_TABLET | Freq: Four times a day (QID) | ORAL | Status: DC | PRN

## 2018-11-27 MED ORDER — IRBESARTAN 300 MG PO TABS: 300.0000 mg | ORAL_TABLET | Freq: Every day | ORAL | Status: DC

## 2018-11-27 MED ORDER — HYDROMORPHONE HCL 1 MG/ML IJ SOLN: 0.5000 mg | INTRAMUSCULAR | Status: DC | PRN

## 2018-11-27 MED ORDER — CHLORHEXIDINE GLUCONATE CLOTH 2 % EX PADS: 6.0000 | MEDICATED_PAD | Freq: Once | CUTANEOUS | Status: DC

## 2018-11-27 MED ORDER — OXYCODONE HCL 5 MG PO TABS
ORAL_TABLET | ORAL | Status: AC
  Filled 2018-11-27: qty 2

## 2018-11-27 MED ORDER — CYCLOBENZAPRINE HCL 10 MG PO TABS: 10.0000 mg | ORAL_TABLET | Freq: Three times a day (TID) | ORAL | 0 refills | Status: DC | PRN

## 2018-11-27 MED ORDER — SODIUM CHLORIDE 0.9 % IV SOLN
INTRAVENOUS | Status: DC | PRN
  Administered 2018-11-27: 500 mL

## 2018-11-27 MED ORDER — AZILSARTAN-CHLORTHALIDONE 40-25 MG PO TABS: 1.0000 | ORAL_TABLET | Freq: Every day | ORAL | Status: DC

## 2018-11-27 MED ORDER — PANTOPRAZOLE SODIUM 40 MG IV SOLR: 40.0000 mg | Freq: Every day | INTRAVENOUS | Status: DC

## 2018-11-27 MED ORDER — THROMBIN 5000 UNITS EX SOLR
CUTANEOUS | Status: DC | PRN
  Administered 2018-11-27 (×2): 5000 [IU] via TOPICAL

## 2018-11-27 MED ORDER — ONDANSETRON HCL 4 MG/2ML IJ SOLN: 4.0000 mg | Freq: Four times a day (QID) | INTRAMUSCULAR | Status: DC | PRN

## 2018-11-27 MED ORDER — LACTATED RINGERS IV SOLN
INTRAVENOUS | Status: DC
  Administered 2018-11-27: 11:00:00 via INTRAVENOUS

## 2018-11-27 MED ORDER — ASPIRIN 325 MG PO TABS: 325.0000 mg | ORAL_TABLET | Freq: Every day | ORAL | Status: DC

## 2018-11-27 MED ORDER — CHLORTHALIDONE 25 MG PO TABS: 25.0000 mg | ORAL_TABLET | Freq: Every day | ORAL | Status: DC

## 2018-11-27 MED ORDER — POTASSIUM CHLORIDE CRYS ER 20 MEQ PO TBCR
20.0000 meq | EXTENDED_RELEASE_TABLET | Freq: Two times a day (BID) | ORAL | Status: DC
  Administered 2018-11-27: 20 meq via ORAL
  Filled 2018-11-27: qty 1

## 2018-11-27 MED ORDER — CANAGLIFLOZIN 100 MG PO TABS
100.0000 mg | ORAL_TABLET | Freq: Every day | ORAL | Status: DC
  Filled 2018-11-27: qty 1

## 2018-11-27 MED ORDER — HYDROMORPHONE HCL 1 MG/ML IJ SOLN
INTRAMUSCULAR | Status: AC
  Filled 2018-11-27: qty 0.5

## 2018-11-27 MED ORDER — THROMBIN 5000 UNITS EX SOLR
CUTANEOUS | Status: AC
  Filled 2018-11-27: qty 10000

## 2018-11-27 MED ORDER — FOLIC ACID 1 MG PO TABS: 1.0000 mg | ORAL_TABLET | Freq: Every day | ORAL | Status: DC

## 2018-11-27 MED ORDER — METOPROLOL SUCCINATE ER 25 MG PO TB24: 25.0000 mg | ORAL_TABLET | Freq: Two times a day (BID) | ORAL | Status: DC

## 2018-11-27 MED ORDER — ACETAMINOPHEN 650 MG RE SUPP: 650.0000 mg | RECTAL | Status: DC | PRN

## 2018-11-27 MED ORDER — TRAMADOL HCL 50 MG PO TABS: 50.0000 mg | ORAL_TABLET | Freq: Four times a day (QID) | ORAL | Status: DC | PRN

## 2018-11-27 MED ORDER — OXYCODONE HCL 5 MG PO TABS
10.0000 mg | ORAL_TABLET | ORAL | Status: DC | PRN
  Administered 2018-11-27: 10 mg via ORAL
  Filled 2018-11-27: qty 2

## 2018-11-27 MED ORDER — LIDOCAINE-EPINEPHRINE 1 %-1:100000 IJ SOLN
INTRAMUSCULAR | Status: AC
  Filled 2018-11-27: qty 1

## 2018-11-27 MED ORDER — LIDOCAINE 2% (20 MG/ML) 5 ML SYRINGE
INTRAMUSCULAR | Status: DC | PRN
  Administered 2018-11-27: 40 mg via INTRAVENOUS
  Administered 2018-11-27: 60 mg via INTRAVENOUS

## 2018-11-27 MED ORDER — FENTANYL CITRATE (PF) 100 MCG/2ML IJ SOLN
INTRAMUSCULAR | Status: AC
  Filled 2018-11-27: qty 2

## 2018-11-27 MED ORDER — SODIUM CHLORIDE 0.9% FLUSH: 3.0000 mL | INTRAVENOUS | Status: DC | PRN

## 2018-11-27 MED ORDER — PENICILLIN V POTASSIUM 250 MG PO TABS: 500.0000 mg | ORAL_TABLET | Freq: Four times a day (QID) | ORAL | Status: DC

## 2018-11-27 MED ORDER — MIDAZOLAM HCL 5 MG/5ML IJ SOLN
INTRAMUSCULAR | Status: DC | PRN
  Administered 2018-11-27: 2 mg via INTRAVENOUS

## 2018-11-27 MED ORDER — ATORVASTATIN CALCIUM 10 MG PO TABS: 10.0000 mg | ORAL_TABLET | Freq: Every day | ORAL | Status: DC

## 2018-11-27 MED ORDER — ONDANSETRON HCL 4 MG/2ML IJ SOLN
INTRAMUSCULAR | Status: DC | PRN
  Administered 2018-11-27: 4 mg via INTRAVENOUS

## 2018-11-27 MED ORDER — ACETAMINOPHEN 10 MG/ML IV SOLN
INTRAVENOUS | Status: AC
  Filled 2018-11-27: qty 100

## 2018-11-27 MED ORDER — GLIPIZIDE 5 MG PO TABS
5.0000 mg | ORAL_TABLET | Freq: Two times a day (BID) | ORAL | Status: DC
  Administered 2018-11-27: 5 mg via ORAL
  Filled 2018-11-27: qty 1

## 2018-11-27 MED ORDER — CYCLOBENZAPRINE HCL 10 MG PO TABS
10.0000 mg | ORAL_TABLET | Freq: Three times a day (TID) | ORAL | Status: DC | PRN
  Administered 2018-11-27: 10 mg via ORAL
  Filled 2018-11-27: qty 1

## 2018-11-27 MED ORDER — CEFAZOLIN SODIUM-DEXTROSE 2-4 GM/100ML-% IV SOLN
INTRAVENOUS | Status: AC
  Filled 2018-11-27: qty 100

## 2018-11-27 MED ORDER — FENTANYL CITRATE (PF) 250 MCG/5ML IJ SOLN
INTRAMUSCULAR | Status: DC | PRN
  Administered 2018-11-27 (×5): 50 ug via INTRAVENOUS

## 2018-11-27 MED ORDER — MIDAZOLAM HCL 2 MG/2ML IJ SOLN
INTRAMUSCULAR | Status: AC
  Filled 2018-11-27: qty 2

## 2018-11-27 MED ORDER — OXYCODONE HCL 10 MG PO TABS: 10.0000 mg | ORAL_TABLET | ORAL | 0 refills | Status: DC | PRN

## 2018-11-27 MED ORDER — ALBUMIN HUMAN 5 % IV SOLN
INTRAVENOUS | Status: DC | PRN
  Administered 2018-11-27: 14:00:00 via INTRAVENOUS

## 2018-11-27 MED ORDER — SODIUM CHLORIDE 0.9 % IV SOLN: 250.0000 mL | INTRAVENOUS | Status: DC

## 2018-11-27 MED ORDER — LEVOTHYROXINE SODIUM 100 MCG PO TABS: 100.0000 ug | ORAL_TABLET | Freq: Every day | ORAL | Status: DC

## 2018-11-27 MED ORDER — SUCCINYLCHOLINE CHLORIDE 200 MG/10ML IV SOSY
PREFILLED_SYRINGE | INTRAVENOUS | Status: DC | PRN
  Administered 2018-11-27: 140 mg via INTRAVENOUS

## 2018-11-27 SURGICAL SUPPLY — 62 items
BAG DECANTER FOR FLEXI CONT (MISCELLANEOUS) ×3 IMPLANT
BENZOIN TINCTURE PRP APPL 2/3 (GAUZE/BANDAGES/DRESSINGS) ×3 IMPLANT
BLADE CLIPPER SURG (BLADE) ×2 IMPLANT
BLADE SURG 11 STRL SS (BLADE) ×3 IMPLANT
BUR CUTTER 7.0 ROUND (BURR) ×3 IMPLANT
BUR MATCHSTICK NEURO 3.0 LAGG (BURR) ×3 IMPLANT
CANISTER SUCT 3000ML PPV (MISCELLANEOUS) ×3 IMPLANT
CARTRIDGE OIL MAESTRO DRILL (MISCELLANEOUS) ×1 IMPLANT
CLOSURE WOUND 1/2 X4 (GAUZE/BANDAGES/DRESSINGS) ×1
COVER WAND RF STERILE (DRAPES) ×3 IMPLANT
DECANTER SPIKE VIAL GLASS SM (MISCELLANEOUS) ×3 IMPLANT
DERMABOND ADVANCED (GAUZE/BANDAGES/DRESSINGS) ×2
DERMABOND ADVANCED .7 DNX12 (GAUZE/BANDAGES/DRESSINGS) ×1 IMPLANT
DIFFUSER DRILL AIR PNEUMATIC (MISCELLANEOUS) ×3 IMPLANT
DRAPE HALF SHEET 40X57 (DRAPES) ×2 IMPLANT
DRAPE LAPAROTOMY 100X72X124 (DRAPES) ×3 IMPLANT
DRAPE MICROSCOPE LEICA (MISCELLANEOUS) ×3 IMPLANT
DRAPE SURG 17X23 STRL (DRAPES) ×3 IMPLANT
DURAPREP 26ML APPLICATOR (WOUND CARE) ×3 IMPLANT
ELECT REM PT RETURN 9FT ADLT (ELECTROSURGICAL) ×3
ELECTRODE REM PT RTRN 9FT ADLT (ELECTROSURGICAL) ×1 IMPLANT
GAUZE 4X4 16PLY RFD (DISPOSABLE) IMPLANT
GAUZE SPONGE 4X4 12PLY STRL (GAUZE/BANDAGES/DRESSINGS) ×3 IMPLANT
GLOVE BIO SURGEON STRL SZ 6.5 (GLOVE) ×7 IMPLANT
GLOVE BIO SURGEON STRL SZ7 (GLOVE) ×2 IMPLANT
GLOVE BIO SURGEON STRL SZ7.5 (GLOVE) ×2 IMPLANT
GLOVE BIO SURGEON STRL SZ8 (GLOVE) ×3 IMPLANT
GLOVE BIO SURGEONS STRL SZ 6.5 (GLOVE) ×7
GLOVE BIOGEL PI IND STRL 6.5 (GLOVE) IMPLANT
GLOVE BIOGEL PI IND STRL 7.0 (GLOVE) IMPLANT
GLOVE BIOGEL PI IND STRL 7.5 (GLOVE) IMPLANT
GLOVE BIOGEL PI IND STRL 8 (GLOVE) IMPLANT
GLOVE BIOGEL PI INDICATOR 6.5 (GLOVE) ×6
GLOVE BIOGEL PI INDICATOR 7.0 (GLOVE) ×2
GLOVE BIOGEL PI INDICATOR 7.5 (GLOVE) ×6
GLOVE BIOGEL PI INDICATOR 8 (GLOVE) ×4
GLOVE ECLIPSE 7.5 STRL STRAW (GLOVE) ×2 IMPLANT
GLOVE INDICATOR 8.5 STRL (GLOVE) ×3 IMPLANT
GOWN STRL REUS W/ TWL LRG LVL3 (GOWN DISPOSABLE) ×1 IMPLANT
GOWN STRL REUS W/ TWL XL LVL3 (GOWN DISPOSABLE) ×2 IMPLANT
GOWN STRL REUS W/TWL 2XL LVL3 (GOWN DISPOSABLE) IMPLANT
GOWN STRL REUS W/TWL LRG LVL3 (GOWN DISPOSABLE) ×6
GOWN STRL REUS W/TWL XL LVL3 (GOWN DISPOSABLE) ×4
KIT BASIN OR (CUSTOM PROCEDURE TRAY) ×3 IMPLANT
KIT TURNOVER KIT B (KITS) ×3 IMPLANT
NDL SPNL 22GX3.5 QUINCKE BK (NEEDLE) ×1 IMPLANT
NEEDLE HYPO 22GX1.5 SAFETY (NEEDLE) ×3 IMPLANT
NEEDLE SPNL 22GX3.5 QUINCKE BK (NEEDLE) ×3 IMPLANT
NS IRRIG 1000ML POUR BTL (IV SOLUTION) ×3 IMPLANT
OIL CARTRIDGE MAESTRO DRILL (MISCELLANEOUS) ×3
PACK LAMINECTOMY NEURO (CUSTOM PROCEDURE TRAY) ×3 IMPLANT
RUBBERBAND STERILE (MISCELLANEOUS) ×6 IMPLANT
SPONGE SURGIFOAM ABS GEL SZ50 (HEMOSTASIS) ×3 IMPLANT
STRIP CLOSURE SKIN 1/2X4 (GAUZE/BANDAGES/DRESSINGS) ×2 IMPLANT
SUT VIC AB 0 CT1 18XCR BRD8 (SUTURE) ×1 IMPLANT
SUT VIC AB 0 CT1 8-18 (SUTURE) ×2
SUT VIC AB 2-0 CT1 18 (SUTURE) ×3 IMPLANT
SUT VIC AB 4-0 PS2 27 (SUTURE) ×4 IMPLANT
SUT VICRYL 4-0 PS2 18IN ABS (SUTURE) ×3 IMPLANT
TOWEL GREEN STERILE (TOWEL DISPOSABLE) ×3 IMPLANT
TOWEL GREEN STERILE FF (TOWEL DISPOSABLE) ×3 IMPLANT
WATER STERILE IRR 1000ML POUR (IV SOLUTION) ×3 IMPLANT

## 2018-11-27 NOTE — H&P (Signed)
Phillip Turner is an 58 y.o. male.   Chief Complaint: Back and bilateral leg pain HPI: 58 year old gentleman who did very well with an L1-2 discectomy however over the last several weeks is a progressive worsening back pain recurrent left leg pain but also right leg pain work-up revealed a large recurrent disc herniation at L1-L2 as well as new disclamation at L3-4 patient was severely symptomatic at both levels both levels were refractory to conservative treatment due to patient's progression of clinical syndrome imaging findings and failed conservative treatment I recommended redo laminectomy discectomy at L1-L2 and a new limited to microdiscectomy at L3-4.  I extensively went over the risks and benefits of the operation with him as well as perioperative course expectations of outcome and alternatives of surgery and he understood and agreed to proceed forward.  Past Medical History:  Diagnosis Date  . Anxiety    from past drug use not longer having anxiety  . Arthritis   . Depression    past depression relating to drug use   . Diabetes mellitus   . Gout   . Hyperlipidemia   . Hypertension   . Hypothyroidism   . Rheumatic fever    as a teenager  . 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 Stroke [facility])    does not remember when, weakness on right side, some trouble speaking at times  . Substance abuse ([place])    former cocaine user- not since March 2014    Past Surgical History:  Procedure Laterality Date  . BACK SURGERY  2020  . boil removed from tailbone     . COLONOSCOPY    . EYE SURGERY Right 2018  . TONSILLECTOMY    . TOTAL HIP ARTHROPLASTY Left 11/14/2015   Procedure: LEFT TOTAL HIP ARTHROPLASTY ANTERIOR APPROACH;  Surgeon: Renette Butters, MD;  Location: [place];  Service: Orthopedics;  Laterality: Left;  . TOTAL HIP ARTHROPLASTY Right 03/26/2016   Procedure: RIGHT TOTAL HIP ARTHROPLASTY ANTERIOR APPROACH;  Surgeon: Renette Butters, MD;  Location: [facility];  Service:  Orthopedics;  Laterality: Right;    Family History  Problem Relation Age of Onset  . Coronary artery disease Mother   . Diabetes type II Father   . Hypertension Father   . Colon cancer Neg Hx   . Esophageal cancer Neg Hx   . Rectal cancer Neg Hx   . Stomach cancer Neg Hx    Social History:  reports that he has quit smoking. His smoking use included cigarettes. He quit after 5.00 years of use. He has never used smokeless tobacco. He reports that he does not drink alcohol or use drugs.  Allergies:  Allergies  Allergen Reactions  . Clonidine Derivatives Other (See Comments)    Slow heartbeat.  . Shellfish Allergy Anaphylaxis and Nausea And Vomiting  . Naproxen Swelling  . Indomethacin Swelling  . Invokana [Canagliflozin] Other (See Comments)    Headaches  . Metformin And Related Nausea And Vomiting    Patient states it makes him sick  . Other Itching, Swelling and Rash    Stainless steel.     Not pure metals; "cheap metals"    Medications Prior to Admission  Medication Sig Dispense Refill  . acetaminophen (TYLENOL) 500 MG tablet Take 2,000-3,000 mg by mouth daily as needed for mild pain or headache.     Marland Kitchen amLODipine (NORVASC) 10 MG tablet Take 10 mg by mouth at bedtime.    Marland Kitchen aspirin EC  325 MG tablet Take 1 tablet (325 mg total) by mouth daily. For 30 days postop for DVT prophylaxis.  Resume home dose when complete. (Patient taking differently: Take 325 mg by mouth daily. ) 30 tablet 0  . atorvastatin (LIPITOR) 10 MG tablet Take 10 mg by mouth at bedtime.     . baclofen (LIORESAL) 20 MG tablet Take 20 mg by mouth 2 (two) times daily.     . colchicine 0.6 MG tablet Take 0.6 mg by mouth 2 (two) times daily.    . Dulaglutide (TRULICITY) 3.41 DQ/2.2WL SOPN Inject 0.75 mg into the skin every Wednesday.    Marland Kitchen EDARBYCLOR 40-25 MG TABS Take 1 tablet by mouth daily.     . empagliflozin (JARDIANCE) 25 MG TABS tablet Take 25 mg by mouth daily.     . folic acid (FOLVITE) 1 MG tablet Take 1  mg by mouth daily.    Marland Kitchen glipiZIDE (GLUCOTROL) 5 MG tablet Take 5 mg by mouth 2 (two) times daily before a meal.     . hydroxypropyl methylcellulose / hypromellose (ISOPTO TEARS / GONIOVISC) 2.5 % ophthalmic solution Place 2 drops into both eyes as needed (red eyes).    Marland Kitchen levothyroxine (SYNTHROID, LEVOTHROID) 100 MCG tablet Take 100 mcg by mouth daily.     . metoprolol succinate (TOPROL-XL) 25 MG 24 hr tablet Take 25 mg by mouth 2 (two) times daily.     . penicillin v potassium (VEETID) 500 MG tablet Take 500 mg by mouth 4 (four) times daily.    . potassium chloride SA (K-DUR) 20 MEQ tablet Take 20 mEq by mouth See admin instructions. Take 20 meq twice daily, may take a third 20 meq dose as needed for rapid heart rate    . sildenafil (VIAGRA) 100 MG tablet Take 100 mg by mouth as needed for erectile dysfunction.    . traMADol (ULTRAM) 50 MG tablet Take 50 mg by mouth every 6 (six) hours as needed for moderate pain.     . traZODone (DESYREL) 100 MG tablet Take 200 mg by mouth at bedtime.    . Vitamin D, Ergocalciferol, (DRISDOL) 1.25 MG (50000 UT) CAPS capsule Take 50,000 Units by mouth every Tuesday.      Results for orders placed or performed during the hospital encounter of 11/27/18 (from the past 48 hour(s))  Glucose, capillary     Status: None   Collection Time: 11/27/18 10:20 AM  Result Value Ref Range   Glucose-Capillary 83 70 - 99 mg/dL  Glucose, capillary     Status: None   Collection Time: 11/27/18 11:40 AM  Result Value Ref Range   Glucose-Capillary 80 70 - 99 mg/dL   No results found.  Review of Systems  Musculoskeletal: Positive for back pain.  Neurological: Positive for tingling and speech change.    Blood pressure 137/70, pulse 93, temperature 97.9 F (36.6 C), temperature source Oral, resp. rate 20, height 5\' 10"  (1.778 m), weight 113.4 kg, SpO2 98 %. Physical Exam  Constitutional: He is oriented to person, place, and time. He appears well-developed and well-nourished.   HENT:  Head: Normocephalic.  Eyes: Pupils are equal, round, and reactive to light.  Neck: Normal range of motion.  Cardiovascular: Normal rate.  Respiratory: Effort normal.  GI: Soft. Bowel sounds are normal.  Musculoskeletal: Normal range of motion.  Neurological: He is alert and oriented to person, place, and time.  Strength is 5/5 iliopsoas, quads, hamstrings, gastrocs, into tibialis, and EHL on the right left  iliopsoas is slightly weak at 4+ out of 5     Assessment/Plan 58 year old presents for redo L1-L2 and L3-4 microdiscectomy  Cameren Odwyer P, MD 11/27/2018, 12:15 PM

## 2018-11-27 NOTE — Op Note (Signed)
Preoperative diagnosis: Recurrent herniated nucleus pulposus L1-L2 left and herniated nucleus pulposus L3-4 right  Postoperative diagnosis: Same  Procedure: #1 redo lumbar laminectomy microdiscectomy L1-L2 and with microscopic foraminotomies of the L2 nerve root microscopic discectomy  2.  Lumbar laminectomy microdiscectomy L3-4 on the right with microscopic dissection of the right L4 nerve root microscopic discectomy  Surgeon: Dominica Severin Luman Holway  Assistant: Nash Shearer  Anesthesia: General  EBL: Minimal  HPI: 58 year old gentleman with history of disc herniation L1-L2 that was operated on a couple months ago presents with recurrent severe back left leg pain but also right L4 radicular symptoms work-up revealed recurrent disc radiation L1-L2 and a new disclamation L3-4.  Due to patient's progression of clinical syndrome imaging findings and failed conservative treatment I recommended redo laminectomy L1-L2 on the left and a microdiscectomy L3-4 on the right I extensively went over the risks and benefits of the operation with him as well as perioperative course expectations of outcome and alternatives of surgery and they understand and agreed to proceed forward  Operative procedure: Patient was brought into the OR was induced under general anesthesia positioned prone on the [place] frame her back was prepped and draped in routine sterile fashion preoperative x-ray localized L3-4 neural incision was there to help localize L1-L2.  So then both incisions were infiltrated scar tissue was dissected down and I exposed the laminotomy defect at L1-L2 in addition on the right-sided L3-4 subperiosteal dissection was carried out the lamina of L3-4 confirmed by intraoperative x-ray.  Then first working at L1-L2 and a microscope illumination I developed the plane under the laminotomy defect extended laminotomy both superiorly medially and inferiorly identified the pedicle at L2 marching superiorly identified a very  large disc herniation still partially contained and scarring ligament teased out from underneath thecal sac and removed and cleaned out the disc space with pituitary rongeurs.  At the anesthetic there is no further stenosis on thecal sac or left L2 nerve root.  This was then packed with Gelfoam tension taken L3-4 from the opposite side.  Laminotomy was begun with a 2 intermittent Kerrison punch ligament flow was identified removed in piecemeal fashion extend the laminotomy down to get to the inferior aspect of the L3 pedicle identified the L3 pedicle and and unroofed the L3 foramen.  Identified the disc base there was a large free fragment inferior the space as well as a large fragment at the level of the space this was all removed the space was incised cleaned out with Epstein curettes pituitary rongeurs and then discectomy there is no further stenosis on thecal sac or right L4 nerve root.  Wounds and copiously irrigated meticulous hemostasis was maintained Gelfoam was opened up the dura the muscle fascia approximate layers with Vicryl skin was closed running 4 subcuticular Dermabond benzoin Steri-Strips and a sterile dressing was applied patient recovery in stable condition.  At the end of the case all needle count sponge counts were correct.

## 2018-11-27 NOTE — Discharge Summary (Signed)
Physician Discharge Summary      Providing Compassionate, Quality Care - Together  Patient ID: Phillip Turner MRN: [id number] DOB/AGE: [birth year] 58 y.o.  Admit date: 11/27/2018 Discharge date: 11/27/2018  Admission Diagnoses: Recurrent herniated nucleus pulposus L1-L2 left and herniated nucleus pulposus L3-4 right  Discharge Diagnoses:  Active Problems:   HNP (herniated nucleus pulposus), lumbar   Discharged Condition: good  Hospital Course: Patient underwent a redo lumbar laminectomy and microdiscectomy at L1-2 and a lumbar laminectomy and microdiscectomy of L3-4 on the right with microscopic dissection at the right L4 nerve root on 11/27/2018.  He did well postoperatively.  His pain is well controlled.  He is ready to discharge home.  Consults: None  Significant Diagnostic Studies: Dg Lumbar Spine 2-3 Views  Result Date: 11/27/2018 CLINICAL DATA:  Lumbar surgery EXAM: LUMBAR SPINE - 2-3 VIEW COMPARISON:  MRI lumbar spine 11/12/2018 FINDINGS: Prior MRI labeled with 5 lumbar vertebra, current exam labeled accordingly. Image #1 @ 1275 hours: Metallic probe via dorsal approach projects dorsal to spinous process of L3. Image #2 @ 1700 hours: Metallic probes via dorsal approach project dorsal to the superior L4 and inferior L1 levels. IMPRESSION: Intraoperative lumbar localization images as above. Electronically Signed   By: Lavonia Dana M.D.   On: 11/27/2018 16:34   Oriented x4 PERRLA CN II-XII grossly intact Moves all extremities; strength 5/5 BUE and BLE except for EHL is 4+/5 bilaterally Incision is clean, dry, and intact  Treatments: Surgery:  Redo lumbar laminectomy and microdiscectomy L1-L2 and with microscopic foraminotomies of the L2 nerve root microscopic discectomy. Lumbar laminectomy and microdiscectomy L3-4 on the right, with microscopic dissection of the right L4 nerve root microscopic discectomy.  Discharge Exam: Blood pressure (!) 107/57, pulse (!) 57, temperature  97.6 F (36.4 C), temperature source Oral, resp. rate 18, height 5\' 10"  (1.778 m), weight 113.4 kg, SpO2 99 %.     Disposition: Discharge disposition: 01-Home or Self Care        Allergies as of 11/27/2018      Reactions   Clonidine Derivatives Other (See Comments)   Slow heartbeat.   Shellfish Allergy Anaphylaxis, Nausea And Vomiting   Naproxen Swelling   Indomethacin Swelling   Invokana [canagliflozin] Other (See Comments)   Headaches   Metformin And Related Nausea And Vomiting   Patient states it makes him sick   Other Itching, Swelling, Rash   Stainless steel.     Not pure metals; "cheap metals"      Medication List    STOP taking these medications   traMADol 50 MG tablet Commonly known as: ULTRAM     TAKE these medications   acetaminophen 500 MG tablet Commonly known as: TYLENOL Take 2,000-3,000 mg by mouth daily as needed for mild pain or headache.   amLODipine 10 MG tablet Commonly known as: NORVASC Take 10 mg by mouth at bedtime.   aspirin EC 325 MG tablet Take 1 tablet (325 mg total) by mouth daily. For 30 days postop for DVT prophylaxis.  Resume home dose when complete. What changed: additional instructions   atorvastatin 10 MG tablet Commonly known as: LIPITOR Take 10 mg by mouth at bedtime.   baclofen 20 MG tablet Commonly known as: LIORESAL Take 20 mg by mouth 2 (two) times daily.   colchicine 0.6 MG tablet Take 0.6 mg by mouth 2 (two) times daily.   cyclobenzaprine 10 MG tablet Commonly known as: FLEXERIL Take 1 tablet (10 mg total) by mouth 3 (three) times  daily as needed for muscle spasms.   docusate sodium 100 MG capsule Commonly known as: Colace Take 1 capsule (100 mg total) by mouth 2 (two) times daily.   Edarbyclor 40-25 MG Tabs Generic drug: Azilsartan-Chlorthalidone Take 1 tablet by mouth daily.   empagliflozin 25 MG Tabs tablet Commonly known as: JARDIANCE Take 25 mg by mouth daily.   folic acid 1 MG tablet Commonly  known as: FOLVITE Take 1 mg by mouth daily.   glipiZIDE 5 MG tablet Commonly known as: GLUCOTROL Take 5 mg by mouth 2 (two) times daily before a meal.   hydroxypropyl methylcellulose / hypromellose 2.5 % ophthalmic solution Commonly known as: ISOPTO TEARS / GONIOVISC Place 2 drops into both eyes as needed (red eyes).   levothyroxine 100 MCG tablet Commonly known as: SYNTHROID Take 100 mcg by mouth daily.   metoprolol succinate 25 MG 24 hr tablet Commonly known as: TOPROL-XL Take 25 mg by mouth 2 (two) times daily.   Oxycodone HCl 10 MG Tabs Take 1 tablet (10 mg total) by mouth every 4 (four) hours as needed for severe pain ((score 7 to 10)).   penicillin v potassium 500 MG tablet Commonly known as: VEETID Take 500 mg by mouth 4 (four) times daily.   potassium chloride SA 20 MEQ tablet Commonly known as: K-DUR Take 20 mEq by mouth See admin instructions. Take 20 meq twice daily, may take a third 20 meq dose as needed for rapid heart rate   sildenafil 100 MG tablet Commonly known as: VIAGRA Take 100 mg by mouth as needed for erectile dysfunction.   traZODone 100 MG tablet Commonly known as: DESYREL Take 200 mg by mouth at bedtime.   Trulicity 3.88 EK/8.0KL Sopn Generic drug: Dulaglutide Inject 0.75 mg into the skin every Wednesday.   Vitamin D (Ergocalciferol) 1.25 MG (50000 UT) Caps capsule Commonly known as: DRISDOL Take 50,000 Units by mouth every Tuesday.      Follow-up Information    Kary Kos, MD. Schedule an appointment as soon as possible for a visit in 2 week(s).   Specialty: Neurosurgery Contact information: [address] [place] [address] [phone]           Signed: Patricia Nettle 11/27/2018, 6:58 PM

## 2018-11-27 NOTE — Anesthesia Procedure Notes (Signed)
Procedure Name: Intubation Date/Time: 11/27/2018 1:09 PM Performed by: Jearld Pies, CRNA Pre-anesthesia Checklist: Patient identified, Emergency Drugs available, Suction available and Patient being monitored Patient Re-evaluated:Patient Re-evaluated prior to induction Oxygen Delivery Method: Circle System Utilized Preoxygenation: Pre-oxygenation with 100% oxygen Induction Type: IV induction, Rapid sequence and Cricoid Pressure applied Laryngoscope Size: Mac and 4 Grade View: Grade I Tube type: Oral Tube size: 7.5 mm Number of attempts: 1 Airway Equipment and Method: Stylet and Oral airway Placement Confirmation: ETT inserted through vocal cords under direct vision,  positive ETCO2 and breath sounds checked- equal and bilateral Secured at: 22 cm Tube secured with: Tape Dental Injury: Teeth and Oropharynx as per pre-operative assessment

## 2018-11-27 NOTE — Discharge Instructions (Signed)

## 2018-11-27 NOTE — Transfer of Care (Signed)
Immediate Anesthesia Transfer of Care Note  Patient: Phillip Turner  Procedure(s) Performed: MICRODISCECTOMY REDO LEFT LUMBAR ONE- LUMBAR TWO, RIGHT LUMBAR THREE- LUMBAR FOUR (N/A Back)  Patient Location: PACU  Anesthesia Type:General  Level of Consciousness: awake, alert  and oriented  Airway & Oxygen Therapy: Patient Spontanous Breathing and Patient connected to face mask oxygen  Post-op Assessment: Report given to RN, Post -op Vital signs reviewed and stable and Patient moving all extremities X 4  Post vital signs: Reviewed and stable  Last Vitals:  Vitals Value Taken Time  BP 125/66 11/27/18 1631  Temp 36.3 C 11/27/18 1630  Pulse 56 11/27/18 1635  Resp 15 11/27/18 1635  SpO2 97 % 11/27/18 1635  Vitals shown include unvalidated device data.  Last Pain:  Vitals:   11/27/18 1630  TempSrc:   PainSc: (P) 0-No pain         Complications: No apparent anesthesia complications

## 2018-11-27 NOTE — Progress Notes (Signed)
Patient discharged to home . No signs of acute distress at the time of discharge. Vital signs stable,  Patient ambulated, ate and  Voided. Discharge instruction given/explained and verbalized understanding. Personal belongings released to patient from security at the time of discharge.

## 2018-11-28 NOTE — Anesthesia Postprocedure Evaluation (Signed)
Anesthesia Post Note  Patient: Arts administrator  Procedure(s) Performed: MICRODISCECTOMY REDO LEFT LUMBAR ONE- LUMBAR TWO, RIGHT LUMBAR THREE- LUMBAR FOUR (N/A Back)     Patient location during evaluation: PACU Anesthesia Type: General Level of consciousness: awake and alert Pain management: pain level controlled Vital Signs Assessment: post-procedure vital signs reviewed and stable Respiratory status: spontaneous breathing, nonlabored ventilation, respiratory function stable and patient connected to nasal cannula oxygen Cardiovascular status: blood pressure returned to baseline and stable Postop Assessment: no apparent nausea or vomiting Anesthetic complications: no    Last Vitals:  Vitals:   11/27/18 1724 11/27/18 1959  BP: (!) 107/57 130/65  Pulse: (!) 57 (!) 58  Resp: 18 20  Temp: 36.4 C (!) 36.3 C  SpO2: 99% 99%    Last Pain:  Vitals:   11/27/18 2040  TempSrc:   PainSc: 6                  Charvis Lightner COKER

## 2018-11-29 ENCOUNTER — Encounter (HOSPITAL_COMMUNITY): Payer: Self-pay | Admitting: Neurosurgery

## 2018-11-30 LAB — GLUCOSE, CAPILLARY: Glucose-Capillary: 74 mg/dL (ref 70–99)

## 2019-02-07 ENCOUNTER — Encounter (HOSPITAL_COMMUNITY): Payer: Self-pay

## 2019-02-07 ENCOUNTER — Other Ambulatory Visit: Payer: Self-pay

## 2019-02-07 ENCOUNTER — Emergency Department (HOSPITAL_COMMUNITY)
Admission: EM | Admit: 2019-02-07 | Discharge: 2019-02-07 | Disposition: A | Discharge status: Home / Self Care | Payer: Medicare HMO | Attending: Emergency Medicine | Admitting: Emergency Medicine

## 2019-02-07 DIAGNOSIS — Z7982 Long term (current) use of aspirin: Secondary | ICD-10-CM | POA: Diagnosis not present

## 2019-02-07 DIAGNOSIS — Z87891 Personal history of nicotine dependence: Secondary | ICD-10-CM | POA: Diagnosis not present

## 2019-02-07 DIAGNOSIS — Z7984 Long term (current) use of oral hypoglycemic drugs: Secondary | ICD-10-CM | POA: Insufficient documentation

## 2019-02-07 DIAGNOSIS — Z79899 Other long term (current) drug therapy: Secondary | ICD-10-CM | POA: Insufficient documentation

## 2019-02-07 DIAGNOSIS — E039 Hypothyroidism, unspecified: Secondary | ICD-10-CM | POA: Diagnosis not present

## 2019-02-07 DIAGNOSIS — E119 Type 2 diabetes mellitus without complications: Secondary | ICD-10-CM | POA: Diagnosis not present

## 2019-02-07 DIAGNOSIS — I10 Essential (primary) hypertension: Secondary | ICD-10-CM | POA: Insufficient documentation

## 2019-02-07 DIAGNOSIS — K047 Periapical abscess without sinus: Secondary | ICD-10-CM | POA: Insufficient documentation

## 2019-02-07 DIAGNOSIS — K029 Dental caries, unspecified: Secondary | ICD-10-CM | POA: Diagnosis not present

## 2019-02-07 DIAGNOSIS — K0889 Other specified disorders of teeth and supporting structures: Secondary | ICD-10-CM | POA: Diagnosis present

## 2019-02-07 MED ORDER — ACETAMINOPHEN 500 MG PO TABS
1000.0000 mg | ORAL_TABLET | Freq: Once | ORAL | Status: AC
  Administered 2019-02-07: 1000 mg via ORAL
  Filled 2019-02-07: qty 2

## 2019-02-07 MED ORDER — AMOXICILLIN 500 MG PO CAPS: 500.0000 mg | ORAL_CAPSULE | Freq: Three times a day (TID) | ORAL | 0 refills | Status: DC

## 2019-02-07 MED ORDER — AMOXICILLIN 500 MG PO CAPS
1000.0000 mg | ORAL_CAPSULE | Freq: Once | ORAL | Status: AC
  Administered 2019-02-07: 11:00:00 1000 mg via ORAL
  Filled 2019-02-07: qty 2

## 2019-02-07 NOTE — ED Triage Notes (Addendum)
Pt states that he has been having pain in his lower middle gum for several days. Pt states it kept him awake last night. Pt states he does not have a dentist.

## 2019-02-07 NOTE — Discharge Instructions (Signed)
It was our pleasure to provide your ER care today - we hope that you feel better.  Take antibiotic as prescribed.   Take acetaminophen as need for pain.  Follow up with dentist this coming week - call office tomorrow AM to arrange appointment.   Return to ER if worse, new symptoms, high fevers, facial swelling, swelling/pain to neck or throat, trouble breathing or swallowing, or other concern.

## 2019-02-07 NOTE — ED Provider Notes (Signed)
[name] DEPT Provider Note   CSN: [id number] Arrival date & time: 02/07/19  [id number]     History   Chief Complaint Chief Complaint  Patient presents with  . Dental Pain    HPI Phillip Turner is a 58 y.o. male.     Patient c/o tooth pain and associated swelling to gum for past 2 days. Symptoms acute onset, moderate, constant, persistent, non radiating. No trauma/injury to area. No swelling or pain to neck. No sore throat or trouble swallowing. No fever or chills. No headache. Has no local dentist.   The history is provided by the patient.  Dental Pain Associated symptoms: no fever, no headaches and no neck pain     Past Medical History:  Diagnosis Date  . Anxiety    from past drug use not longer having anxiety  . Arthritis   . Depression    past depression relating to drug use   . Diabetes mellitus   . Gout   . Hyperlipidemia   . Hypertension   . Hypothyroidism   . Rheumatic fever    as a teenager  . 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 Stroke [facility])    does not remember when, weakness on right side, some trouble speaking at times  . Substance abuse ([place])    former cocaine user- not since March 2014    Patient Active Problem List   Diagnosis Date Noted  . HNP (herniated nucleus pulposus), lumbar 11/27/2018  . Herniated intervertebral disc of lumbar spine 05/07/2018  . Primary osteoarthritis of right hip 03/04/2016  . Primary osteoarthritis of left hip 10/17/2015  . CVA (cerebral infarction) 09/08/2013  . HTN (hypertension) 03/12/2012  . Major depression, recurrent ([place]) 03/10/2012  . Anxiety disorder 03/10/2012  . Leukocytosis 03/08/2012  . Hyponatremia 03/08/2012  . DM (diabetes mellitus), type 2, uncontrolled with complications ([place]) [id number]  . Hypothyroidism 03/29/2011  . Depression 03/27/2011  . H/O 03/27/2011  . Memory impairment 03/27/2011  . Alcohol dependence ([facility]) 03/26/2011  . 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 Cocaine abuse ([place]) 03/26/2011    Past Surgical History:  Procedure Laterality Date  . BACK SURGERY  2020  . boil removed from tailbone     . COLONOSCOPY    . EYE SURGERY Right 2018  . LUMBAR LAMINECTOMY/DECOMPRESSION MICRODISCECTOMY N/A 11/27/2018   Procedure: MICRODISCECTOMY REDO LEFT LUMBAR ONE- LUMBAR TWO, RIGHT LUMBAR THREE- LUMBAR FOUR;  Surgeon: Kary Kos, MD;  Location: [facility];  Service: Neurosurgery;  Laterality: N/A;  . TONSILLECTOMY    . TOTAL HIP ARTHROPLASTY Left 11/14/2015   Procedure: LEFT TOTAL HIP ARTHROPLASTY ANTERIOR APPROACH;  Surgeon: Renette Butters, MD;  Location: [facility];  Service: Orthopedics;  Laterality: Left;  . TOTAL HIP ARTHROPLASTY Right 03/26/2016   Procedure: RIGHT TOTAL HIP ARTHROPLASTY ANTERIOR APPROACH;  Surgeon: Renette Butters, MD;  Location: [facility];  Service: Orthopedics;  Laterality: Right;        Home Medications    Prior to Admission medications   Medication Sig Start Date End Date Taking? Authorizing Provider  acetaminophen (TYLENOL) 500 MG tablet Take 2,000-3,000 mg by mouth daily as needed for mild pain or headache.     [provider]  amLODipine (NORVASC) 10 MG tablet Take 10 mg by mouth at bedtime.    [provider]  aspirin EC 325 MG tablet Take 1 tablet (325 mg total) by mouth daily. For 30 days postop for DVT prophylaxis.  Resume home dose when complete. Patient taking differently: Take 325 mg by mouth daily.  03/26/16   Prudencio Burly III, PA-C  atorvastatin (LIPITOR) 10 MG tablet Take 10 mg by mouth at bedtime.     [provider]  baclofen (LIORESAL) 20 MG tablet Take 20 mg by mouth 2 (two) times daily.  04/01/18   [provider]  colchicine 0.6 MG tablet Take 0.6 mg by mouth 2 (two) times daily.    [provider]  cyclobenzaprine (FLEXERIL) 10 MG tablet Take 1 tablet (10 mg total) by mouth 3 (three) times daily as needed for muscle spasms. 11/27/18    Viona Gilmore D, NP  docusate sodium (COLACE) 100 MG capsule Take 1 capsule (100 mg total) by mouth 2 (two) times daily. 11/27/18 11/27/19  Viona Gilmore D, NP  Dulaglutide (TRULICITY) [id number] [id number] SOPN Inject 0.75 mg into the skin every Wednesday.    [provider]  EDARBYCLOR 40-25 MG TABS Take 1 tablet by mouth daily.  09/04/15   [provider]  empagliflozin (JARDIANCE) 25 MG TABS tablet Take 25 mg by mouth daily.     [provider]  folic acid (FOLVITE) 1 MG tablet Take 1 mg by mouth daily.    [provider]  glipiZIDE (GLUCOTROL) 5 MG tablet Take 5 mg by mouth 2 (two) times daily before a meal.     [provider]  hydroxypropyl methylcellulose / hypromellose (ISOPTO TEARS / GONIOVISC) 2.5 % ophthalmic solution Place 2 drops into both eyes as needed (red eyes).    [provider]  levothyroxine (SYNTHROID, LEVOTHROID) 100 MCG tablet Take 100 mcg by mouth daily.  08/04/15   [provider]  metoprolol succinate (TOPROL-XL) 25 MG 24 hr tablet Take 25 mg by mouth 2 (two) times daily.     [provider]  oxyCODONE 10 MG TABS Take 1 tablet (10 mg total) by mouth every 4 (four) hours as needed for severe pain ((score 7 to 10)). 11/27/18   Viona Gilmore D, NP  penicillin v potassium (VEETID) 500 MG tablet Take 500 mg by mouth 4 (four) times daily.    [provider]  potassium chloride SA (K-DUR) 20 MEQ tablet Take 20 mEq by mouth See admin instructions. Take 20 meq twice daily, may take a third 20 meq dose as needed for rapid heart rate    [provider]  sildenafil (VIAGRA) 100 MG tablet Take 100 mg by mouth as needed for erectile dysfunction.    [provider]  traZODone (DESYREL) 100 MG tablet Take 200 mg by mouth at bedtime.    [provider]  Vitamin D, Ergocalciferol, (DRISDOL) 1.25 MG (50000 UT) CAPS capsule Take 50,000 Units by mouth every Tuesday.    [provider]     Family History Family History  Problem Relation Age of Onset  . Coronary artery disease Mother   . Diabetes type II Father   . Hypertension Father   . Colon cancer Neg Hx   . Esophageal cancer Neg Hx   . Rectal cancer Neg Hx   . Stomach cancer Neg Hx     Social History Social History   Tobacco Use  . Smoking status: Former Smoker    Years: 5.00    Types: Cigarettes  . Smokeless tobacco: Never Used  Substance Use Topics  . Alcohol use: No  . Drug use: No    Comment: Cocaine, crack former- since 2014  Allergies   Clonidine derivatives, Shellfish allergy, Naproxen, Indomethacin, Invokana [canagliflozin], Metformin and related, and Other   Review of Systems Review of Systems  Constitutional: Negative for chills and fever.  HENT: Negative for sore throat and trouble swallowing.   Respiratory: Negative for shortness of breath.   Musculoskeletal: Negative for neck pain.  Skin: Negative for rash.  Neurological: Negative for headaches.     Physical Exam Updated Vital Signs BP 136/65   Pulse (!) 54   Temp 98.7 F (37.1 C) (Oral)   Resp 16   Wt 113 kg   SpO2 100%   BMI 35.74 kg/m   Physical Exam Vitals signs and nursing note reviewed.  Constitutional:      Appearance: Normal appearance. He is well-developed.  HENT:     Head: Atraumatic.     Nose: Nose normal.     Mouth/Throat:     Mouth: Mucous membranes are moist.     Pharynx: Oropharynx is clear. No oropharyngeal exudate or posterior oropharyngeal erythema.     Comments: Dental decay. Tooth #26 sl loose, with associated gum swelling/tenderness, ?dental abscess. No fluctuance. No swelling to floor of mouth or neck. Pharynx normal.  Eyes:     General: No scleral icterus.    Conjunctiva/sclera: Conjunctivae normal.  Neck:     Musculoskeletal: Normal range of motion and neck supple. No neck rigidity or muscular tenderness.     Trachea: No tracheal deviation.  Cardiovascular:     Pulses: Normal  pulses.  Pulmonary:     Effort: Pulmonary effort is normal. No accessory muscle usage or respiratory distress.     Breath sounds: No stridor.  Musculoskeletal:        General: No swelling.  Lymphadenopathy:     Cervical: No cervical adenopathy.  Skin:    General: Skin is warm and dry.     Findings: No rash.  Neurological:     Mental Status: He is alert.     Comments: Alert, speech clear.   Psychiatric:        Mood and Affect: Mood normal.      ED Treatments / Results  Labs (all labs ordered are listed, but only abnormal results are displayed) Labs Reviewed - No data to display  EKG None  Radiology No results found.  Procedures Procedures (including critical care time)  Medications Ordered in ED Medications  amoxicillin (AMOXIL) capsule 1,000 mg (has no administration in time range)  acetaminophen (TYLENOL) tablet 1,000 mg (has no administration in time range)     Initial Impression / Assessment and Plan / ED Course  I have reviewed the triage vital signs and the nursing notes.  Pertinent labs & imaging results that were available during my care of the patient were reviewed by me and considered in my medical decision making (see chart for details).  Confirmed no antibiotic allergies w pt. Amoxicillin po. Acetaminophen po.  Rec dental follow up as outpt this coming week.  Return precautions provided.   Reviewed nursing notes and prior charts for additional history.     Final Clinical Impressions(s) / ED Diagnoses   Final diagnoses:  None    ED Discharge Orders    None       Lajean Saver, MD 02/07/19 1058

## 2019-02-23 ENCOUNTER — Other Ambulatory Visit: Payer: Self-pay | Admitting: Student

## 2019-02-23 DIAGNOSIS — M5416 Radiculopathy, lumbar region: Secondary | ICD-10-CM

## 2019-03-15 ENCOUNTER — Ambulatory Visit
Admission: RE | Admit: 2019-03-15 | Discharge: 2019-03-15 | Disposition: A | Discharge status: Home / Self Care | Payer: Medicare HMO | Source: Ambulatory Visit | Attending: Student | Admitting: Student

## 2019-03-15 ENCOUNTER — Other Ambulatory Visit: Payer: Self-pay

## 2019-03-15 DIAGNOSIS — M5416 Radiculopathy, lumbar region: Secondary | ICD-10-CM

## 2019-03-15 MED ORDER — GADOBENATE DIMEGLUMINE 529 MG/ML IV SOLN
20.0000 mL | Freq: Once | INTRAVENOUS | Status: AC | PRN
  Administered 2019-03-15: 20 mL via INTRAVENOUS

## 2019-03-17 ENCOUNTER — Other Ambulatory Visit: Payer: Self-pay | Admitting: Student

## 2019-03-17 DIAGNOSIS — M5416 Radiculopathy, lumbar region: Secondary | ICD-10-CM

## 2019-03-22 ENCOUNTER — Other Ambulatory Visit: Payer: Self-pay | Admitting: Student

## 2019-03-22 ENCOUNTER — Ambulatory Visit
Admission: RE | Admit: 2019-03-22 | Discharge: 2019-03-22 | Disposition: A | Discharge status: Home / Self Care | Payer: Medicare HMO | Source: Ambulatory Visit | Attending: Student | Admitting: Student

## 2019-03-22 ENCOUNTER — Other Ambulatory Visit: Payer: Self-pay

## 2019-03-22 DIAGNOSIS — M5416 Radiculopathy, lumbar region: Secondary | ICD-10-CM

## 2019-03-22 MED ORDER — METHYLPREDNISOLONE ACETATE 40 MG/ML INJ SUSP (RADIOLOG
120.0000 mg | Freq: Once | INTRAMUSCULAR | Status: AC
  Administered 2019-03-22: 120 mg via EPIDURAL

## 2019-03-22 MED ORDER — IOPAMIDOL (ISOVUE-M 200) INJECTION 41%
1.0000 mL | Freq: Once | INTRAMUSCULAR | Status: AC
  Administered 2019-03-22: 1 mL via EPIDURAL

## 2019-03-22 NOTE — Discharge Instructions (Signed)

## 2019-05-01 ENCOUNTER — Emergency Department (HOSPITAL_COMMUNITY)
Admission: EM | Admit: 2019-05-01 | Discharge: 2019-05-01 | Disposition: A | Discharge status: Home / Self Care | Payer: Medicare HMO | Attending: Emergency Medicine | Admitting: Emergency Medicine

## 2019-05-01 ENCOUNTER — Encounter (HOSPITAL_COMMUNITY): Payer: Self-pay

## 2019-05-01 ENCOUNTER — Other Ambulatory Visit: Payer: Self-pay

## 2019-05-01 DIAGNOSIS — K0889 Other specified disorders of teeth and supporting structures: Secondary | ICD-10-CM

## 2019-05-01 DIAGNOSIS — Z8673 Personal history of transient ischemic attack (TIA), and cerebral infarction without residual deficits: Secondary | ICD-10-CM | POA: Insufficient documentation

## 2019-05-01 DIAGNOSIS — E039 Hypothyroidism, unspecified: Secondary | ICD-10-CM | POA: Diagnosis not present

## 2019-05-01 DIAGNOSIS — I10 Essential (primary) hypertension: Secondary | ICD-10-CM | POA: Insufficient documentation

## 2019-05-01 DIAGNOSIS — Z7982 Long term (current) use of aspirin: Secondary | ICD-10-CM | POA: Insufficient documentation

## 2019-05-01 DIAGNOSIS — Z96643 Presence of artificial hip joint, bilateral: Secondary | ICD-10-CM | POA: Diagnosis not present

## 2019-05-01 DIAGNOSIS — Z7984 Long term (current) use of oral hypoglycemic drugs: Secondary | ICD-10-CM | POA: Diagnosis not present

## 2019-05-01 DIAGNOSIS — F17200 Nicotine dependence, unspecified, uncomplicated: Secondary | ICD-10-CM | POA: Diagnosis not present

## 2019-05-01 DIAGNOSIS — Z79899 Other long term (current) drug therapy: Secondary | ICD-10-CM | POA: Diagnosis not present

## 2019-05-01 DIAGNOSIS — E119 Type 2 diabetes mellitus without complications: Secondary | ICD-10-CM | POA: Diagnosis not present

## 2019-05-01 MED ORDER — CLINDAMYCIN HCL 150 MG PO CAPS: 450.0000 mg | ORAL_CAPSULE | Freq: Four times a day (QID) | ORAL | 0 refills | Status: AC

## 2019-05-01 NOTE — Discharge Instructions (Addendum)
You were seen in the ER for dental pain.  Pain is likely from broken tooth/exposed nerve endings from cavities.  There are no signs of superimposed infection or collection of pus to require drainage but you are at high risk for abscess and infection.   Take antibiotic as prescribed. This should help with an early infection and the pain.   Dental pain is difficult to control because most of the pain is from exposed nerve endings.  Take 1000 mg of acetaminophen (Tylenol) every 6 hours.  For more pain control take ibuprofen 600 mg every 6 hours.  You can combine acetaminophen and ibuprofen as above every 6 hours for maximum pain and inflammation control.  Use pea sized amount of Orajel 15 to 20 minutes around area of pain and exposed nerves prior to eating or drinking to desensitize nerve endings.  You can apply a pea sized amount of desensitizing toothpastes (Sensodyne, Pronamel, [employer] sensitive) throughout the day to help with nerve pain and sensitivity.  Warm water and salt soaks can help with inflammation and drainage formation.  Unfortunately, dental pain will continue until you have a full dental evaluation and treatment.  See dental resources attached.  Additionally, I have given you Dr. Radford Pax and Dr. Mariel Sleet contact information, they frequently try to accommodate patients from the ER.  Return to the ER for fevers, facial or gum line swelling, redness, pus.

## 2019-05-01 NOTE — ED Triage Notes (Signed)
Pt states that he is having 2 toothaches- one on the front tooth, then on his upper left wisdom tooth. Pt states the pain has gotten so bad to cause headache.  Pt has dentist appt on 05/11/18, but states he is in too much pain to make it.

## 2019-05-01 NOTE — ED Provider Notes (Signed)
[facility] DEPT Provider Note   CSN: [id number] Arrival date & time: 05/01/19  1241     History Chief Complaint  Patient presents with  . Dental Pain    Phillip Turner is a 58 y.o. male presents to the ER for evaluation of dental pain.  Onset 8 months ago.  Has 3 different teeth that have been bothering him in the right upper, right lower and left upper areas.  Did not have dental insurance for a while but recently got Medicaid and has an appointment with a dentist on 1/6.  Has been noticing increased pain locally especially with cold food/drinks, chewing.  Has associated swelling at the base of the teeth but denies any bleeding, drainage, pus, fevers, facial swelling.  Alternating Tylenol at home without any improvement.  He gets prescription for tramadol/oxycodone by primary care doctor for his chronic low back pain and states these medicines do not touch the pain.  No modifying factors.  HPI     Past Medical History:  Diagnosis Date  . Anxiety    from past drug use not longer having anxiety  . Arthritis   . Depression    past depression relating to drug use   . Diabetes mellitus   . Gout   . Hyperlipidemia   . Hypertension   . Hypothyroidism   . Rheumatic fever    as a teenager  . 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 Stroke [facility])    does not remember when, weakness on right side, some trouble speaking at times  . Substance abuse ([place])    former cocaine user- not since March 2014    Patient Active Problem List   Diagnosis Date Noted  . HNP (herniated nucleus pulposus), lumbar 11/27/2018  . Herniated intervertebral disc of lumbar spine 05/07/2018  . Primary osteoarthritis of right hip 03/04/2016  . Primary osteoarthritis of left hip 10/17/2015  . CVA (cerebral infarction) 09/08/2013  . HTN (hypertension) 03/12/2012  . Major depression, recurrent ([place]) 03/10/2012  . Anxiety disorder 03/10/2012  . Leukocytosis 03/08/2012    . Hyponatremia 03/08/2012  . DM (diabetes mellitus), type 2, uncontrolled with complications ([place]) 99991111  . Hypothyroidism 03/29/2011  . Depression 03/27/2011  . H/O 03/27/2011  . Memory impairment 03/27/2011  . Alcohol dependence ([place]) 03/26/2011  . 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 Cocaine abuse ([place]) 03/26/2011    Past Surgical History:  Procedure Laterality Date  . BACK SURGERY  2020  . boil removed from tailbone     . COLONOSCOPY    . EYE SURGERY Right 2018  . LUMBAR LAMINECTOMY/DECOMPRESSION MICRODISCECTOMY N/A 11/27/2018   Procedure: MICRODISCECTOMY REDO LEFT LUMBAR ONE- LUMBAR TWO, RIGHT LUMBAR THREE- LUMBAR FOUR;  Surgeon: Kary Kos, MD;  Location: [facility];  Service: Neurosurgery;  Laterality: N/A;  . TONSILLECTOMY    . TOTAL HIP ARTHROPLASTY Left 11/14/2015   Procedure: LEFT TOTAL HIP ARTHROPLASTY ANTERIOR APPROACH;  Surgeon: Renette Butters, MD;  Location: [facility];  Service: Orthopedics;  Laterality: Left;  . TOTAL HIP ARTHROPLASTY Right 03/26/2016   Procedure: RIGHT TOTAL HIP ARTHROPLASTY ANTERIOR APPROACH;  Surgeon: Renette Butters, MD;  Location: [facility];  Service: Orthopedics;  Laterality: Right;       Family History  Problem Relation Age of Onset  . Coronary artery disease Mother   . Diabetes type II Father   . Hypertension Father   . Colon cancer Neg Hx   . Esophageal cancer Neg  Hx   . Rectal cancer Neg Hx   . Stomach cancer Neg Hx     Social History   Tobacco Use  . Smoking status: Former Smoker    Years: 5.00    Types: Cigarettes  . Smokeless tobacco: Never Used  Substance Use Topics  . Alcohol use: No  . Drug use: No    Comment: Cocaine, crack former- since 2014    Home Medications Prior to Admission medications   Medication Sig Start Date End Date Taking? Authorizing Provider  acetaminophen (TYLENOL) 500 MG tablet Take 2,000-3,000 mg by mouth daily as needed for mild pain or headache.     [provider]  amLODipine  (NORVASC) 10 MG tablet Take 10 mg by mouth at bedtime.    [provider]  amoxicillin (AMOXIL) 500 MG capsule Take 1 capsule (500 mg total) by mouth 3 (three) times daily. 02/07/19   Lajean Saver, MD  aspirin EC 325 MG tablet Take 1 tablet (325 mg total) by mouth daily. For 30 days postop for DVT prophylaxis.  Resume home dose when complete. Patient taking differently: Take 325 mg by mouth daily.  03/26/16   Prudencio Burly III, PA-C  atorvastatin (LIPITOR) 10 MG tablet Take 10 mg by mouth at bedtime.     [provider]  baclofen (LIORESAL) 20 MG tablet Take 20 mg by mouth 2 (two) times daily.  04/01/18   [provider]  clindamycin (CLEOCIN) 150 MG capsule Take 3 capsules (450 mg total) by mouth every 6 (six) hours for 7 days. 05/01/19 05/08/19  Kinnie Feil, PA-C  colchicine 0.6 MG tablet Take 0.6 mg by mouth 2 (two) times daily.    [provider]  cyclobenzaprine (FLEXERIL) 10 MG tablet Take 1 tablet (10 mg total) by mouth 3 (three) times daily as needed for muscle spasms. 11/27/18   Viona Gilmore D, NP  docusate sodium (COLACE) 100 MG capsule Take 1 capsule (100 mg total) by mouth 2 (two) times daily. 11/27/18 11/27/19  Viona Gilmore D, NP  Dulaglutide (TRULICITY) [id number] [id number] SOPN Inject 0.75 mg into the skin every Wednesday.    [provider]  EDARBYCLOR 40-25 MG TABS Take 1 tablet by mouth daily.  09/04/15   [provider]  empagliflozin (JARDIANCE) 25 MG TABS tablet Take 25 mg by mouth daily.     [provider]  folic acid (FOLVITE) 1 MG tablet Take 1 mg by mouth daily.    [provider]  glipiZIDE (GLUCOTROL) 5 MG tablet Take 5 mg by mouth 2 (two) times daily before a meal.     [provider]  hydroxypropyl methylcellulose / hypromellose (ISOPTO TEARS / GONIOVISC) 2.5 % ophthalmic solution Place 2 drops into both eyes as needed (red eyes).    [provider]  levothyroxine  (SYNTHROID, LEVOTHROID) 100 MCG tablet Take 100 mcg by mouth daily.  08/04/15   [provider]  metoprolol succinate (TOPROL-XL) 25 MG 24 hr tablet Take 25 mg by mouth 2 (two) times daily.     [provider]  oxyCODONE 10 MG TABS Take 1 tablet (10 mg total) by mouth every 4 (four) hours as needed for severe pain ((score 7 to 10)). 11/27/18   Viona Gilmore D, NP  penicillin v potassium (VEETID) 500 MG tablet Take 500 mg by mouth 4 (four) times daily.    [provider]  potassium chloride SA (K-DUR) 20 MEQ tablet Take 20 mEq by mouth See admin  instructions. Take 20 meq twice daily, may take a third 20 meq dose as needed for rapid heart rate    [provider]  sildenafil (VIAGRA) 100 MG tablet Take 100 mg by mouth as needed for erectile dysfunction.    [provider]  traZODone (DESYREL) 100 MG tablet Take 200 mg by mouth at bedtime.    [provider]  Vitamin D, Ergocalciferol, (DRISDOL) 1.25 MG (50000 UT) CAPS capsule Take 50,000 Units by mouth every Tuesday.    [provider]    Allergies    Clonidine derivatives, Shellfish allergy, Naproxen, Indomethacin, Invokana [canagliflozin], Metformin and related, and Other  Review of Systems   Review of Systems  HENT: Positive for dental problem.   All other systems reviewed and are negative.   Physical Exam Updated Vital Signs BP 138/85   Pulse (!) 54   Temp 97.9 F (36.6 C) (Oral)   Resp 16   SpO2 100%   Physical Exam Constitutional:      Appearance: He is well-developed.  HENT:     Head: Normocephalic.     Nose: Nose normal.     Mouth/Throat:     Comments: Focal tenderness with percussion of teeth #27, #31 and #12.  Mild gingiva erythema, edema, tenderness.  No abscess. No trismus.  MMM. Oropharynx and tonsils visualized and normal. No significant facial edema noted  Eyes:     General: Lids are normal.  Cardiovascular:     Rate and Rhythm: Normal rate.  Pulmonary:      Effort: Pulmonary effort is normal. No respiratory distress.  Musculoskeletal:        General: Normal range of motion.     Cervical back: Normal range of motion.  Neurological:     Mental Status: He is alert.  Psychiatric:        Behavior: Behavior normal.     ED Results / Procedures / Treatments   Labs (all labs ordered are listed, but only abnormal results are displayed) Labs Reviewed - No data to display  EKG None  Radiology No results found.  Procedures Procedures (including critical care time)  Medications Ordered in ED Medications - No data to display  ED Course  I have reviewed the triage vital signs and the nursing notes.  Pertinent labs & imaging results that were available during my care of the patient were reviewed by me and considered in my medical decision making (see chart for details).    MDM Rules/Calculators/A&P                      Highest on ddx is dentin/nerve exposure from cracked teeth causing nerve irritation.  No signs of dental abscess on exam but early infectious process possible given poor dentition overall.  Afebrile, non toxic appearing, swallowing secretions well without hot potato voice, drooling, trismus, asymmetric facial or mandibular or maxillary edema. Clinically this is not consistent with Ludwig's angina, dental or facial infectious process or abscess, or other deep tissue infection in neck, jaw, face, throat.  Will treat with abx, NSAIDs, warm salt soaks.  Encouraged patient to follow-up with dentist.  Appointment on 1/6.  Patient given other dentist contact info to assist with closer follow up for ultimate management of dental pain and overall dental health. Strict ED return precautions given. Pt is aware of red flag symptoms that would warrant return to ED for re-evaluation and further treatment. Patient voices understanding and is agreeable to plan.   Final  Clinical Impression(s) / ED Diagnoses Final diagnoses:  Pain, dental     Rx / DC Orders ED Discharge Orders         Ordered    clindamycin (CLEOCIN) 150 MG capsule  Every 6 hours     05/01/19 1419           Arlean Hopping 05/01/19 1422    Dorie Rank, MD 05/02/19 1202

## 2019-05-17 ENCOUNTER — Other Ambulatory Visit: Payer: Self-pay

## 2019-05-17 ENCOUNTER — Encounter (HOSPITAL_COMMUNITY): Payer: Self-pay

## 2019-05-17 ENCOUNTER — Emergency Department (HOSPITAL_COMMUNITY)
Admission: EM | Admit: 2019-05-17 | Discharge: 2019-05-18 | Disposition: A | Discharge status: Home / Self Care | Payer: Medicare HMO | Attending: Emergency Medicine | Admitting: Emergency Medicine

## 2019-05-17 DIAGNOSIS — R21 Rash and other nonspecific skin eruption: Secondary | ICD-10-CM | POA: Diagnosis not present

## 2019-05-17 DIAGNOSIS — R0789 Other chest pain: Secondary | ICD-10-CM | POA: Insufficient documentation

## 2019-05-17 DIAGNOSIS — Z5321 Procedure and treatment not carried out due to patient leaving prior to being seen by health care provider: Secondary | ICD-10-CM | POA: Diagnosis not present

## 2019-05-17 NOTE — ED Triage Notes (Signed)
Patient arrived stating he was seen here last week for a tooth ache and given antibiotics. States 3 days ago began having right sided chest/shoulder pain, reporting he found a rash on the right side of his back. Also stating he has increased reflux.

## 2019-05-19 ENCOUNTER — Other Ambulatory Visit: Payer: Self-pay | Admitting: Student

## 2019-05-19 DIAGNOSIS — M5416 Radiculopathy, lumbar region: Secondary | ICD-10-CM

## 2019-05-27 ENCOUNTER — Other Ambulatory Visit: Payer: Medicare HMO

## 2019-05-31 ENCOUNTER — Other Ambulatory Visit: Payer: Self-pay

## 2019-05-31 ENCOUNTER — Ambulatory Visit
Admission: RE | Admit: 2019-05-31 | Discharge: 2019-05-31 | Disposition: A | Discharge status: Home / Self Care | Payer: Medicare HMO | Source: Ambulatory Visit | Attending: Student | Admitting: Student

## 2019-05-31 ENCOUNTER — Other Ambulatory Visit: Payer: Self-pay | Admitting: Student

## 2019-05-31 ENCOUNTER — Inpatient Hospital Stay
Admission: RE | Admit: 2019-05-31 | Discharge: 2019-05-31 | Disposition: A | Discharge status: Home / Self Care | Payer: Medicare HMO | Source: Ambulatory Visit | Attending: Student | Admitting: Student

## 2019-05-31 DIAGNOSIS — M5416 Radiculopathy, lumbar region: Secondary | ICD-10-CM

## 2019-05-31 MED ORDER — IOPAMIDOL (ISOVUE-M 200) INJECTION 41%
1.0000 mL | Freq: Once | INTRAMUSCULAR | Status: AC
  Administered 2019-05-31: 1 mL via EPIDURAL

## 2019-05-31 MED ORDER — METHYLPREDNISOLONE ACETATE 40 MG/ML INJ SUSP (RADIOLOG
120.0000 mg | Freq: Once | INTRAMUSCULAR | Status: AC
  Administered 2019-05-31: 120 mg via EPIDURAL

## 2019-05-31 NOTE — Discharge Instructions (Signed)

## 2019-05-31 NOTE — Discharge Instructions (Signed)

## 2019-06-16 ENCOUNTER — Other Ambulatory Visit: Payer: Self-pay | Admitting: Student

## 2019-06-16 DIAGNOSIS — M5416 Radiculopathy, lumbar region: Secondary | ICD-10-CM

## 2019-06-23 ENCOUNTER — Other Ambulatory Visit: Payer: Self-pay

## 2019-06-23 ENCOUNTER — Other Ambulatory Visit: Payer: Self-pay | Admitting: Student

## 2019-06-23 ENCOUNTER — Ambulatory Visit
Admission: RE | Admit: 2019-06-23 | Discharge: 2019-06-23 | Disposition: A | Discharge status: Home / Self Care | Payer: Medicare HMO | Source: Ambulatory Visit | Attending: Student | Admitting: Student

## 2019-06-23 DIAGNOSIS — M5416 Radiculopathy, lumbar region: Secondary | ICD-10-CM

## 2019-06-23 MED ORDER — METHYLPREDNISOLONE ACETATE 40 MG/ML INJ SUSP (RADIOLOG
120.0000 mg | Freq: Once | INTRAMUSCULAR | Status: AC
  Administered 2019-06-23: 120 mg via EPIDURAL

## 2019-06-23 MED ORDER — IOPAMIDOL (ISOVUE-M 200) INJECTION 41%
1.0000 mL | Freq: Once | INTRAMUSCULAR | Status: AC
  Administered 2019-06-23: 1 mL via EPIDURAL

## 2019-06-23 NOTE — Discharge Instructions (Signed)

## 2019-08-05 ENCOUNTER — Ambulatory Visit: Payer: Medicare HMO | Attending: Internal Medicine

## 2019-08-05 DIAGNOSIS — Z23 Encounter for immunization: Secondary | ICD-10-CM

## 2019-08-05 NOTE — Progress Notes (Signed)
   Covid-19 Vaccination Clinic  Name:  Phillip Turner    MRN: [id number] DOB: [birth year]  08/05/2019  [name] was observed post Covid-19 immunization for 15 minutes without incident. He was provided with Vaccine Information Sheet and instruction to access the V-Safe system.   [name] was instructed to call 911 with any severe reactions post vaccine: Marland Kitchen Difficulty breathing  . Swelling of face and throat  . A fast heartbeat  . A bad rash all over body  . Dizziness and weakness   Immunizations Administered    Name Date Dose VIS Date Route   Pfizer COVID-19 Vaccine 08/05/2019 11:25 AM 0.3 mL 04/16/2019 Intramuscular   Manufacturer: [employer]   Lot: [id number]   [place]: [id number]

## 2019-09-01 ENCOUNTER — Ambulatory Visit: Payer: Medicare HMO | Attending: Internal Medicine

## 2019-09-01 DIAGNOSIS — Z23 Encounter for immunization: Secondary | ICD-10-CM

## 2019-09-01 NOTE — Progress Notes (Signed)
   Covid-19 Vaccination Clinic  Name:  Phillip Turner    MRN: [id number] DOB: 1961/01/08  09/01/2019  [name] was observed post Covid-19 immunization for 15 minutes without incident. He was provided with Vaccine Information Sheet and instruction to access the V-Safe system.   [name] was instructed to call 911 with any severe reactions post vaccine: Marland Kitchen Difficulty breathing  . Swelling of face and throat  . A fast heartbeat  . A bad rash all over body  . Dizziness and weakness   Immunizations Administered    Name Date Dose VIS Date Route   Pfizer COVID-19 Vaccine 09/01/2019  4:45 PM 0.3 mL 06/30/2018 Intramuscular   Manufacturer: [employer]   Lot: [id number]   [place]: [id number]

## 2020-01-21 ENCOUNTER — Other Ambulatory Visit: Payer: Self-pay | Admitting: Neurosurgery

## 2020-01-21 DIAGNOSIS — M5126 Other intervertebral disc displacement, lumbar region: Secondary | ICD-10-CM

## 2020-02-10 ENCOUNTER — Other Ambulatory Visit: Payer: Medicare HMO

## 2020-03-23 ENCOUNTER — Ambulatory Visit
Admission: RE | Admit: 2020-03-23 | Discharge: 2020-03-23 | Disposition: A | Discharge status: Home / Self Care | Payer: Medicare HMO | Source: Ambulatory Visit | Attending: Neurosurgery | Admitting: Neurosurgery

## 2020-03-23 DIAGNOSIS — M5126 Other intervertebral disc displacement, lumbar region: Secondary | ICD-10-CM

## 2020-03-23 MED ORDER — GADOBENATE DIMEGLUMINE 529 MG/ML IV SOLN
20.0000 mL | Freq: Once | INTRAVENOUS | Status: AC | PRN
  Administered 2020-03-23: 20 mL via INTRAVENOUS

## 2020-03-28 DIAGNOSIS — M48061 Spinal stenosis, lumbar region without neurogenic claudication: Secondary | ICD-10-CM | POA: Diagnosis present

## 2020-04-03 ENCOUNTER — Other Ambulatory Visit: Payer: Self-pay | Admitting: Neurosurgery

## 2020-04-03 DIAGNOSIS — M48061 Spinal stenosis, lumbar region without neurogenic claudication: Secondary | ICD-10-CM

## 2020-04-12 ENCOUNTER — Other Ambulatory Visit: Payer: Self-pay

## 2020-04-12 ENCOUNTER — Ambulatory Visit
Admission: RE | Admit: 2020-04-12 | Discharge: 2020-04-12 | Disposition: A | Discharge status: Home / Self Care | Payer: Medicare HMO | Source: Ambulatory Visit | Attending: Neurosurgery | Admitting: Neurosurgery

## 2020-04-12 DIAGNOSIS — M48061 Spinal stenosis, lumbar region without neurogenic claudication: Secondary | ICD-10-CM

## 2020-04-12 MED ORDER — IOPAMIDOL (ISOVUE-M 200) INJECTION 41%
1.0000 mL | Freq: Once | INTRAMUSCULAR | Status: AC
  Administered 2020-04-12: 1 mL via EPIDURAL

## 2020-04-12 MED ORDER — METHYLPREDNISOLONE ACETATE 40 MG/ML INJ SUSP (RADIOLOG
120.0000 mg | Freq: Once | INTRAMUSCULAR | Status: AC
  Administered 2020-04-12: 120 mg via EPIDURAL

## 2020-04-12 NOTE — Discharge Instructions (Signed)

## 2020-05-03 DIAGNOSIS — Z9889 Other specified postprocedural states: Secondary | ICD-10-CM | POA: Insufficient documentation

## 2020-06-11 LAB — COLOGUARD: COLOGUARD: NEGATIVE

## 2020-08-31 DIAGNOSIS — M5440 Lumbago with sciatica, unspecified side: Secondary | ICD-10-CM | POA: Insufficient documentation

## 2020-09-04 ENCOUNTER — Other Ambulatory Visit: Payer: Self-pay | Admitting: Internal Medicine

## 2020-09-05 LAB — FOLATE: Folate: >24 ng/mL

## 2020-09-05 LAB — TSH: TSH: 2.36 mIU/L (ref 0.40–4.50)

## 2020-09-05 LAB — VITAMIN B12: Vitamin B-12: 337 pg/mL (ref 200–1100)

## 2020-09-05 LAB — T4, FREE: Free T4: 1.1 ng/dL (ref 0.8–1.8)

## 2020-09-05 LAB — EXTRA LAV TOP TUBE

## 2020-10-03 IMAGING — MR MR LUMBAR SPINE W/O CM
4 of 5 series · 26 of 48 positions shown · non-contrast
Comparison: 12/21/2014

CLINICAL DATA: Low back pain and bilateral hip pain, worsened over
the last month.

EXAM:
MRI LUMBAR SPINE WITHOUT CONTRAST
TECHNIQUE: Multiplanar, multisequence MR imaging of the lumbar spine was
performed. No intravenous contrast was administered.

[Series 3: T2 post-contrast · sagittal · 4.0mm · 0.55mm/px · 6 of 15 slices shown]
[im 1/15]
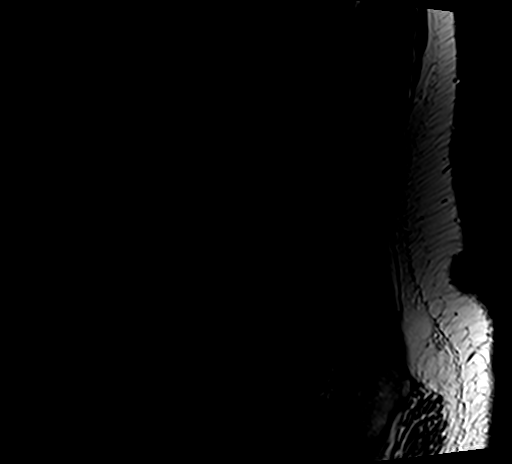
[im 3/15]
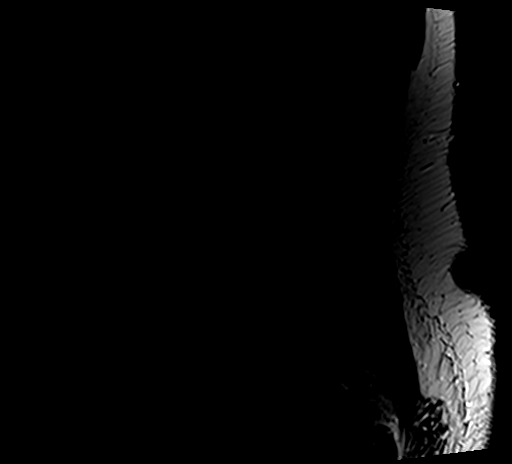
[im 6/15]
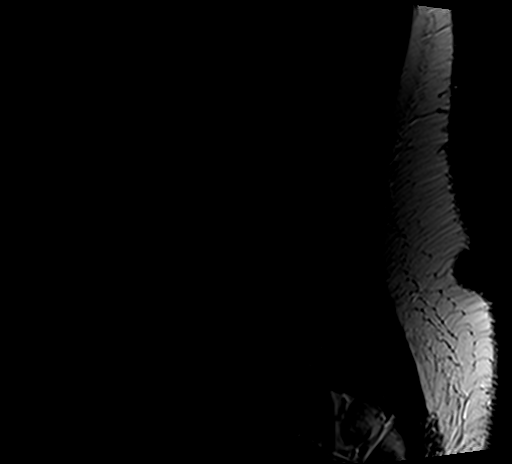
[im 9/15]
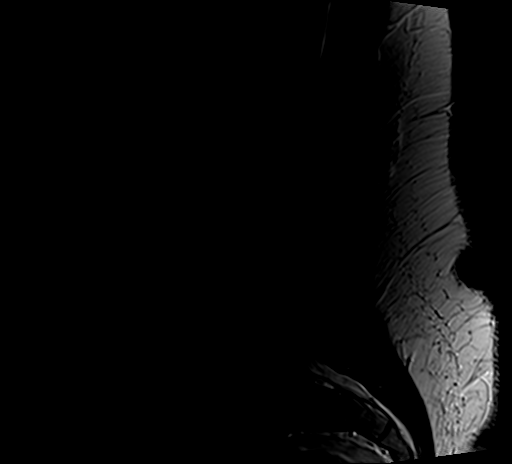
[im 12/15]
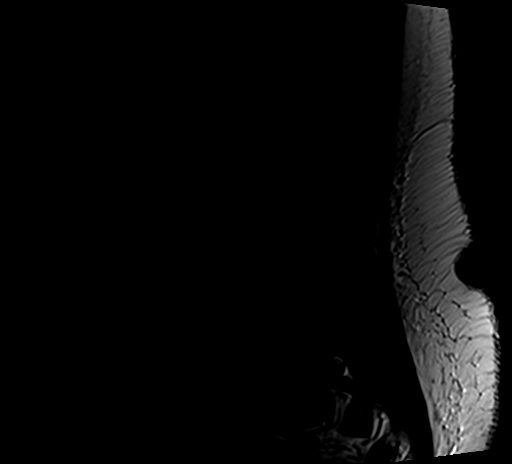
[im 15/15]
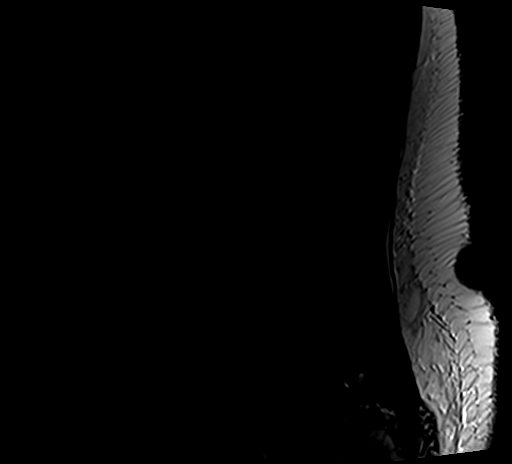

[Series 5: T1 · sagittal · 4.0mm · 0.55mm/px · 7 of 15 slices shown (1 of 2)]
[im 1/15]
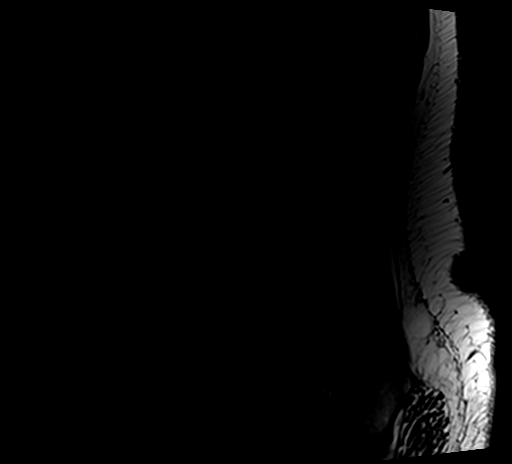
[im 3/15]
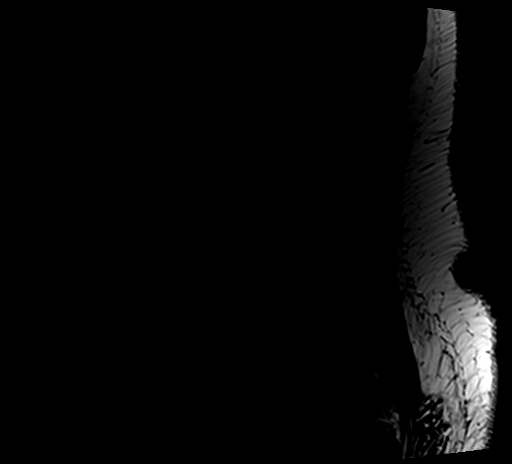
[im 5/15]
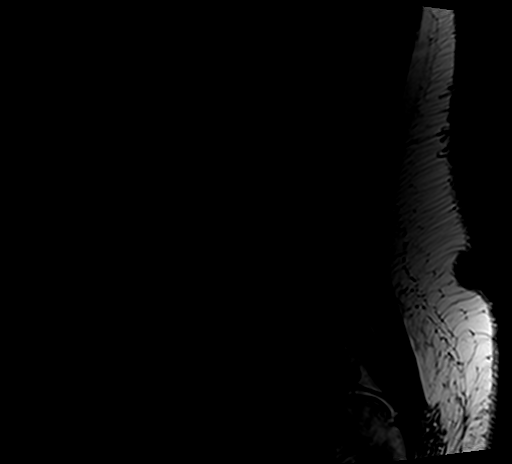
[im 8/15]
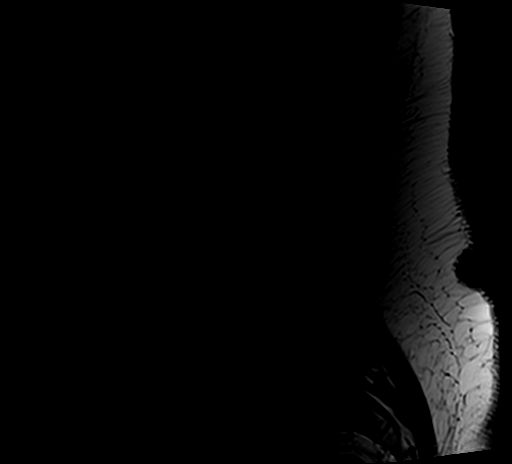
[im 10/15]
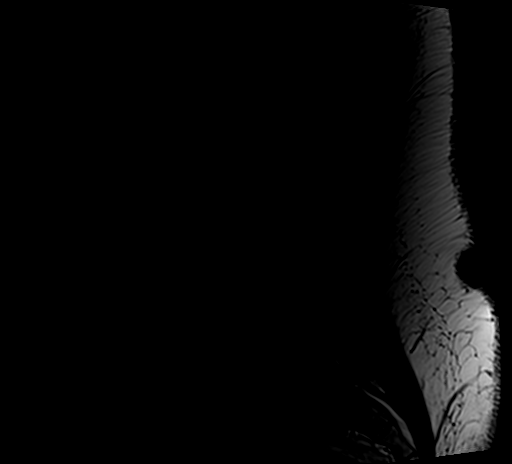
[im 12/15]
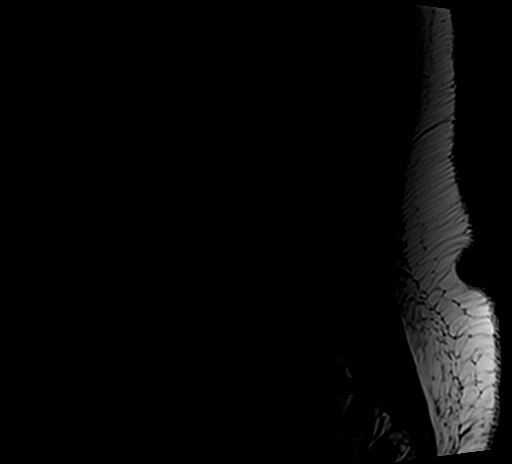
[im 15/15]
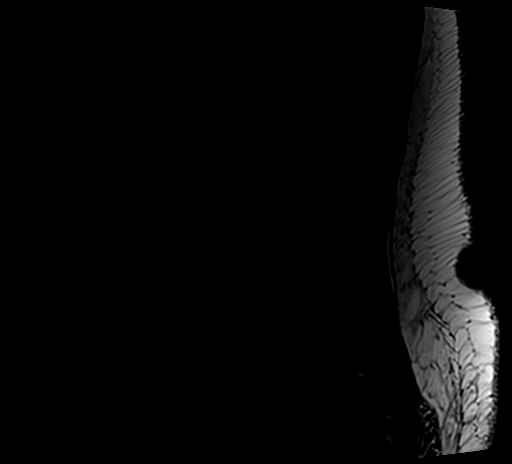

[Series 6: T1 · axial · 4.0mm · 0.35mm/px · z∈[-174,-12]mm · 5 of 32 slices shown (2 of 2)]
[im 1/32]
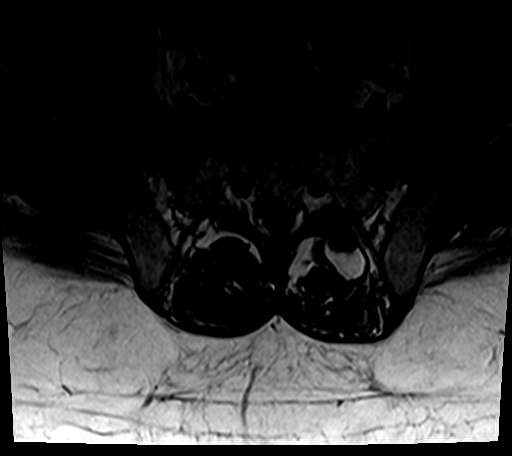
[im 5/32]
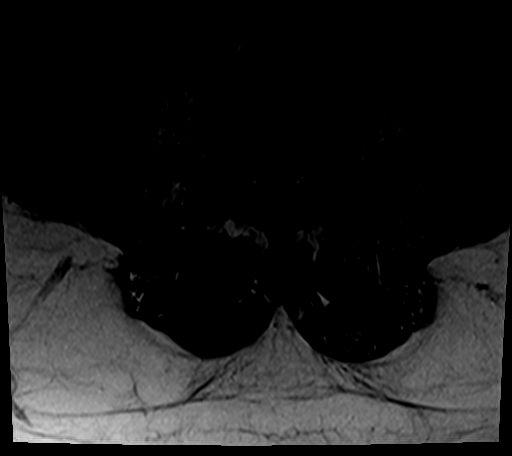
[im 10/32]
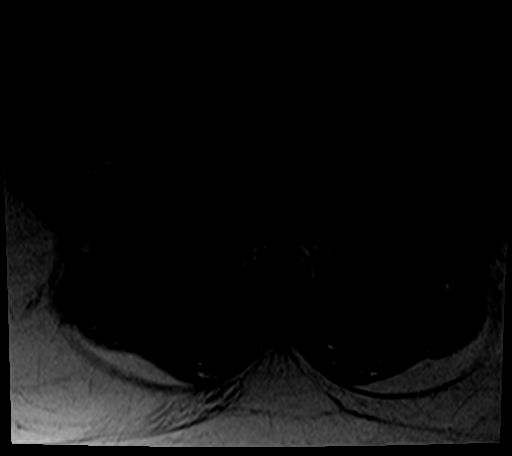
[im 17/32]
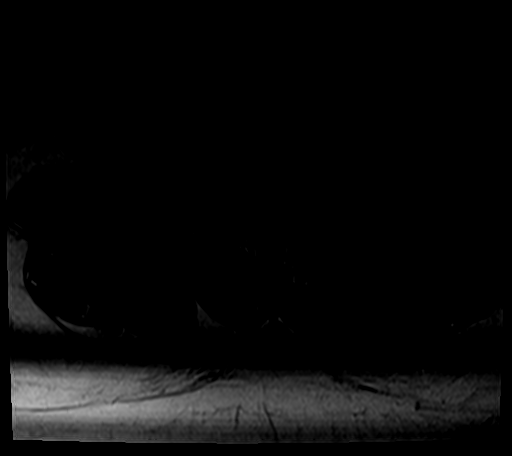
[im 27/32]
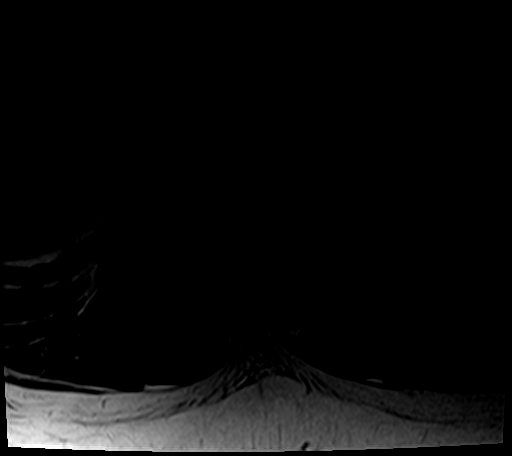

[Series 7: T2 · axial · 4.0mm · 0.70mm/px · z∈[-174,+41]mm · 8 of 32 slices shown]
[im 1/32]
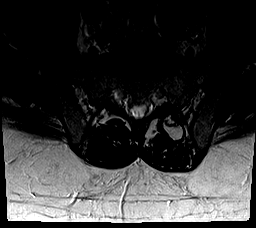
[im 5/32]
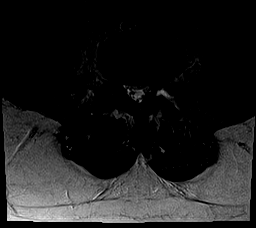
[im 10/32]
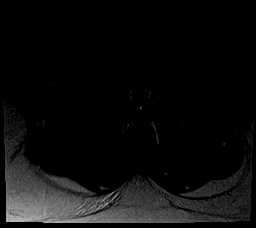
[im 15/32]
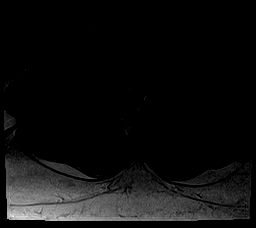
[im 17/32]
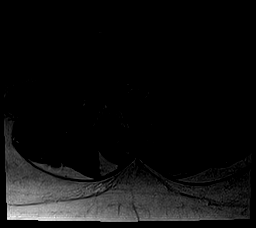
[im 22/32]
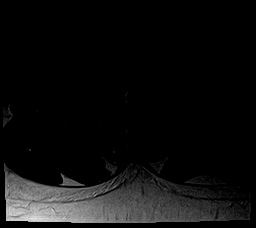
[im 27/32]
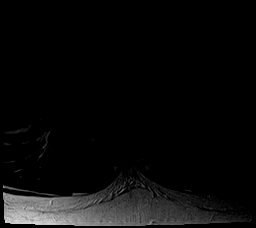
[im 32/32]
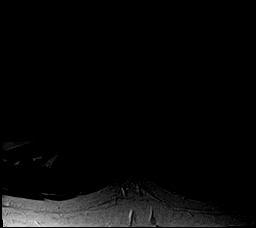

[26 of 48 positions shown; findings below may reference images not displayed]

FINDINGS: Segmentation:  5 lumbar type vertebral bodies.

Alignment:  Normal

Vertebrae: No fracture or primary bone lesion. Discogenic endplate
edema at L3-4 could contribute to back pain.

Conus medullaris and cauda equina: Conus extends to the L1-2 level.
Conus and cauda equina appear normal.

Paraspinal and other soft tissues: Negative

Disc levels:

T12-L1: Normal.

L1-2: Left posterolateral disc herniation with a large fragment
migrated upward to the left of midline extending into the foramen on
the left. Effacement of the subarachnoid space with crowding of the
conus tip and the nerve roots of the cauda equina. Likely left L1
nerve compression.

L2-3: Normal interspace.

L3-4: Broad-based, left posterolateral predominant disc herniation.
Caudal migration in the right lateral recess behind L4. Stenosis
because of this disc pathology could cause neural compression on
either or both sides.

L4-5: Disc bulge more prominent towards the right. Bilateral facet
degeneration. No compressive stenosis.

L5-S1: Mild disc bulge. Bilateral facet arthropathy with gaping,
fluid-filled joints. Mild bilateral foraminal narrowing. No visible
neural compression. This appearance could worsen with standing or
flexion. The facet arthropathy could certainly be painful.
IMPRESSION: Large left posterolateral disc herniation at L1-2 with a large
fragment migrated upward in the left lateral recess into the left
L1-2 foramen. This could cause neural compression at the conus
tip/cauda equina and also in particular of the left L1 nerve.

Broad-based disc herniation at L3-4 more prominent towards the left
at the disc level but with a caudally migrated fragment to the right
of midline. This could cause neural compression on either or both
sides.

L4-5 disc bulge with mild stenosis of the lateral recesses and
foramina but no definite neural compression. Bilateral facet
osteoarthritis.

L5-S1: Advanced bilateral facet arthropathy. Anterolisthesis could
occur with standing or flexion. Mild bilateral foraminal narrowing

## 2021-10-31 DIAGNOSIS — I10 Essential (primary) hypertension: Secondary | ICD-10-CM | POA: Diagnosis not present

## 2021-10-31 DIAGNOSIS — Z0000 Encounter for general adult medical examination without abnormal findings: Secondary | ICD-10-CM | POA: Diagnosis not present

## 2021-10-31 DIAGNOSIS — M1A9XX0 Chronic gout, unspecified, without tophus (tophi): Secondary | ICD-10-CM | POA: Diagnosis not present

## 2021-10-31 DIAGNOSIS — E039 Hypothyroidism, unspecified: Secondary | ICD-10-CM | POA: Diagnosis not present

## 2021-10-31 DIAGNOSIS — M5126 Other intervertebral disc displacement, lumbar region: Secondary | ICD-10-CM | POA: Diagnosis not present

## 2021-10-31 DIAGNOSIS — Z125 Encounter for screening for malignant neoplasm of prostate: Secondary | ICD-10-CM | POA: Diagnosis not present

## 2021-10-31 DIAGNOSIS — E1149 Type 2 diabetes mellitus with other diabetic neurological complication: Secondary | ICD-10-CM | POA: Diagnosis not present

## 2021-10-31 DIAGNOSIS — E7849 Other hyperlipidemia: Secondary | ICD-10-CM | POA: Diagnosis not present

## 2021-11-21 ENCOUNTER — Other Ambulatory Visit: Payer: Self-pay | Admitting: Internal Medicine

## 2021-11-22 LAB — URIC ACID: Uric Acid, Serum: 7.5 mg/dL (ref 4.0–8.0)

## 2021-11-22 LAB — VITAMIN B12: Vitamin B-12: 320 pg/mL (ref 200–1100)

## 2021-11-22 LAB — PSA: PSA: 1.11 ng/mL (ref <=4.00)

## 2021-11-22 LAB — FOLATE: Folate: >24 ng/mL

## 2021-11-22 LAB — LIPID PANEL
Cholesterol: 142 mg/dL (ref <200)
HDL: 82 mg/dL (ref >=40)
LDL Cholesterol (Calc): 46 mg/dL (calc)
Non-HDL Cholesterol (Calc): 60 mg/dL (calc) (ref <130)
Total CHOL/HDL Ratio: 1.7 (calc) (ref <5.0)
Triglycerides: 67 mg/dL (ref <150)

## 2021-11-22 LAB — CBC
HCT: 42 % (ref 38.5–50.0)
Hemoglobin: 14.2 g/dL (ref 13.2–17.1)
MCH: 28.7 pg (ref 27.0–33.0)
MCHC: 33.8 g/dL (ref 32.0–36.0)
MCV: 85 fL (ref 80.0–100.0)
MPV: 8.7 fL (ref 7.5–12.5)
Platelets: 310 10*3/uL (ref 140–400)
RBC: 4.94 10*6/uL (ref 4.20–5.80)
RDW: 16.2 % — ABNORMAL HIGH (ref 11.0–15.0)
WBC: 9.4 10*3/uL (ref 3.8–10.8)

## 2021-11-22 LAB — COMPLETE METABOLIC PANEL WITH GFR
AG Ratio: 1.6 (calc) (ref 1.0–2.5)
ALT: 15 U/L (ref 9–46)
AST: 18 U/L (ref 10–35)
Albumin: 3.9 g/dL (ref 3.6–5.1)
Alkaline phosphatase (APISO): 71 U/L (ref 35–144)
BUN: 12 mg/dL (ref 7–25)
CO2: 23 mmol/L (ref 20–32)
Calcium: 8.8 mg/dL (ref 8.6–10.3)
Chloride: 109 mmol/L (ref 98–110)
Creat: 1.13 mg/dL (ref 0.70–1.35)
Globulin: 2.5 g/dL (calc) (ref 1.9–3.7)
Glucose, Bld: 89 mg/dL (ref 65–99)
Potassium: 3.6 mmol/L (ref 3.5–5.3)
Sodium: 143 mmol/L (ref 135–146)
Total Bilirubin: 0.7 mg/dL (ref 0.2–1.2)
Total Protein: 6.4 g/dL (ref 6.1–8.1)
eGFR: 74 mL/min/{1.73_m2} (ref >=60)

## 2021-11-22 LAB — VITAMIN D 25 HYDROXY (VIT D DEFICIENCY, FRACTURES): Vit D, 25-Hydroxy: 88 ng/mL (ref 30–100)

## 2021-11-22 LAB — T4, FREE: Free T4: 1 ng/dL (ref 0.8–1.8)

## 2021-11-22 LAB — TSH: TSH: 15.28 mIU/L — ABNORMAL HIGH (ref 0.40–4.50)

## 2022-10-18 ENCOUNTER — Other Ambulatory Visit: Payer: Self-pay | Admitting: Internal Medicine

## 2022-10-19 LAB — CBC
HCT: 44.6 % (ref 38.5–50.0)
Hemoglobin: 14.7 g/dL (ref 13.2–17.1)
MCH: 29.4 pg (ref 27.0–33.0)
MCHC: 33 g/dL (ref 32.0–36.0)
MCV: 89.2 fL (ref 80.0–100.0)
MPV: 8.6 fL (ref 7.5–12.5)
Platelets: 247 10*3/uL (ref 140–400)
RBC: 5 10*6/uL (ref 4.20–5.80)
RDW: 14.7 % (ref 11.0–15.0)
WBC: 9.2 10*3/uL (ref 3.8–10.8)

## 2022-10-19 LAB — COMPLETE METABOLIC PANEL WITH GFR
AG Ratio: 1.3 (calc) (ref 1.0–2.5)
ALT: 99 U/L — ABNORMAL HIGH (ref 9–46)
AST: 80 U/L — ABNORMAL HIGH (ref 10–35)
Albumin: 4 g/dL (ref 3.6–5.1)
Alkaline phosphatase (APISO): 95 U/L (ref 35–144)
BUN/Creatinine Ratio: 8 (calc) (ref 6–22)
BUN: 14 mg/dL (ref 7–25)
CO2: 22 mmol/L (ref 20–32)
Calcium: 9.4 mg/dL (ref 8.6–10.3)
Chloride: 99 mmol/L (ref 98–110)
Creat: 1.74 mg/dL — ABNORMAL HIGH (ref 0.70–1.35)
Globulin: 3 g/dL (calc) (ref 1.9–3.7)
Glucose, Bld: 128 mg/dL — ABNORMAL HIGH (ref 65–99)
Potassium: 3.1 mmol/L — ABNORMAL LOW (ref 3.5–5.3)
Sodium: 136 mmol/L (ref 135–146)
Total Bilirubin: 1.4 mg/dL — ABNORMAL HIGH (ref 0.2–1.2)
Total Protein: 7 g/dL (ref 6.1–8.1)
eGFR: 44 mL/min/{1.73_m2} — ABNORMAL LOW (ref >=60)

## 2022-10-19 LAB — VITAMIN B12: Vitamin B-12: 1197 pg/mL — ABNORMAL HIGH (ref 200–1100)

## 2022-10-19 LAB — PSA: PSA: 1.27 ng/mL (ref <=4.00)

## 2022-10-19 LAB — VITAMIN D 25 HYDROXY (VIT D DEFICIENCY, FRACTURES): Vit D, 25-Hydroxy: 83 ng/mL (ref 30–100)

## 2022-10-19 LAB — FOLATE: Folate: >24 ng/mL

## 2022-10-19 LAB — T4, FREE: Free T4: 1 ng/dL (ref 0.8–1.8)

## 2022-10-19 LAB — TSH: TSH: 42.2 mIU/L — ABNORMAL HIGH (ref 0.40–4.50)

## 2022-12-20 ENCOUNTER — Other Ambulatory Visit (HOSPITAL_COMMUNITY): Payer: Self-pay

## 2022-12-20 MED ORDER — TRULICITY 1.5 MG/0.5ML ~~LOC~~ SOAJ
1.5000 mg | SUBCUTANEOUS | 5 refills | Status: DC
  Filled 2022-12-20: qty 2, 28d supply, fill #0

## 2022-12-31 ENCOUNTER — Other Ambulatory Visit (HOSPITAL_COMMUNITY): Payer: Self-pay

## 2023-02-17 ENCOUNTER — Other Ambulatory Visit (HOSPITAL_COMMUNITY): Payer: Self-pay

## 2023-02-17 MED ORDER — TRULICITY 3 MG/0.5ML ~~LOC~~ SOAJ
3.0000 mg | SUBCUTANEOUS | 5 refills | Status: DC
  Filled 2023-02-17: qty 2, 28d supply, fill #0
  Filled 2023-08-14: qty 2, 28d supply, fill #1
  Filled 2023-09-09: qty 2, 28d supply, fill #2
  Filled 2023-10-09: qty 2, 28d supply, fill #3
  Filled 2023-11-05: qty 2, 28d supply, fill #4
  Filled 2023-12-06: qty 2, 28d supply, fill #5

## 2023-02-19 ENCOUNTER — Other Ambulatory Visit (HOSPITAL_COMMUNITY): Payer: Self-pay

## 2023-08-14 ENCOUNTER — Other Ambulatory Visit (HOSPITAL_COMMUNITY): Payer: Self-pay

## 2023-08-20 ENCOUNTER — Other Ambulatory Visit (HOSPITAL_COMMUNITY): Payer: Self-pay

## 2023-08-20 MED ORDER — OXYCODONE-ACETAMINOPHEN 10-325 MG PO TABS
1.0000 | ORAL_TABLET | Freq: Four times a day (QID) | ORAL | 0 refills | Status: DC | PRN
  Filled 2023-09-09: qty 120, 30d supply, fill #0

## 2023-08-21 ENCOUNTER — Other Ambulatory Visit (HOSPITAL_COMMUNITY): Payer: Self-pay

## 2023-08-21 MED ORDER — OXYCODONE-ACETAMINOPHEN 10-325 MG PO TABS
1.0000 | ORAL_TABLET | Freq: Four times a day (QID) | ORAL | 0 refills | Status: DC | PRN
  Filled 2023-08-21 – 2023-10-09 (×2): qty 120, 30d supply, fill #0

## 2023-09-02 ENCOUNTER — Other Ambulatory Visit (HOSPITAL_COMMUNITY): Payer: Self-pay

## 2023-09-08 ENCOUNTER — Other Ambulatory Visit (HOSPITAL_COMMUNITY): Payer: Self-pay

## 2023-09-09 ENCOUNTER — Other Ambulatory Visit (HOSPITAL_COMMUNITY): Payer: Self-pay

## 2023-09-09 ENCOUNTER — Other Ambulatory Visit: Payer: Self-pay

## 2023-10-09 ENCOUNTER — Other Ambulatory Visit (HOSPITAL_COMMUNITY): Payer: Self-pay

## 2023-11-04 ENCOUNTER — Other Ambulatory Visit (HOSPITAL_COMMUNITY): Payer: Self-pay

## 2023-11-04 MED ORDER — OXYCODONE-ACETAMINOPHEN 10-325 MG PO TABS
1.0000 | ORAL_TABLET | Freq: Four times a day (QID) | ORAL | 0 refills | Status: DC | PRN
  Filled 2023-11-04 – 2023-11-06 (×2): qty 120, 30d supply, fill #0

## 2023-11-04 MED ORDER — OXYCODONE-ACETAMINOPHEN 10-325 MG PO TABS
1.0000 | ORAL_TABLET | Freq: Four times a day (QID) | ORAL | 0 refills | Status: DC | PRN
  Filled 2023-11-04 – 2024-01-06 (×2): qty 120, 30d supply, fill #0

## 2023-11-04 MED ORDER — OXYCODONE-ACETAMINOPHEN 10-325 MG PO TABS
1.0000 | ORAL_TABLET | Freq: Four times a day (QID) | ORAL | 0 refills | Status: DC
  Filled 2023-11-04 – 2023-12-06 (×2): qty 120, 30d supply, fill #0

## 2023-11-05 ENCOUNTER — Other Ambulatory Visit (HOSPITAL_COMMUNITY): Payer: Self-pay

## 2023-11-06 ENCOUNTER — Other Ambulatory Visit (HOSPITAL_COMMUNITY): Payer: Self-pay

## 2023-12-06 ENCOUNTER — Other Ambulatory Visit (HOSPITAL_COMMUNITY): Payer: Self-pay

## 2023-12-08 ENCOUNTER — Other Ambulatory Visit (HOSPITAL_COMMUNITY): Payer: Self-pay

## 2024-01-02 ENCOUNTER — Other Ambulatory Visit (HOSPITAL_COMMUNITY): Payer: Self-pay

## 2024-01-05 MED ORDER — TRULICITY 3 MG/0.5ML ~~LOC~~ SOAJ
3.0000 mg | SUBCUTANEOUS | 5 refills | Status: AC
  Filled 2024-01-05: qty 2, 28d supply, fill #0
  Filled 2024-02-05: qty 2, 28d supply, fill #1
  Filled 2024-03-05: qty 2, 28d supply, fill #2
  Filled 2024-03-29: qty 2, 28d supply, fill #3
  Filled 2024-05-07: qty 2, 28d supply, fill #4
  Filled 2024-06-07: qty 2, 28d supply, fill #5

## 2024-01-06 ENCOUNTER — Other Ambulatory Visit (HOSPITAL_COMMUNITY): Payer: Self-pay

## 2024-02-04 ENCOUNTER — Other Ambulatory Visit (HOSPITAL_COMMUNITY): Payer: Self-pay

## 2024-02-04 MED ORDER — OXYCODONE-ACETAMINOPHEN 10-325 MG PO TABS
ORAL_TABLET | ORAL | 0 refills | Status: DC
  Filled 2024-02-04: qty 120, 30d supply, fill #0

## 2024-02-04 MED ORDER — OXYCODONE-ACETAMINOPHEN 10-325 MG PO TABS
1.0000 | ORAL_TABLET | Freq: Four times a day (QID) | ORAL | 0 refills | Status: DC | PRN
  Filled ????-??-??: fill #0

## 2024-02-04 MED ORDER — OXYCODONE-ACETAMINOPHEN 10-325 MG PO TABS
1.0000 | ORAL_TABLET | Freq: Four times a day (QID) | ORAL | 0 refills | Status: DC | PRN
  Filled 2024-03-05: qty 120, 30d supply, fill #0

## 2024-02-05 ENCOUNTER — Other Ambulatory Visit (HOSPITAL_COMMUNITY): Payer: Self-pay

## 2024-02-25 ENCOUNTER — Other Ambulatory Visit (HOSPITAL_COMMUNITY): Payer: Self-pay

## 2024-03-05 ENCOUNTER — Other Ambulatory Visit (HOSPITAL_COMMUNITY): Payer: Self-pay

## 2024-03-05 ENCOUNTER — Other Ambulatory Visit: Payer: Self-pay

## 2024-03-23 ENCOUNTER — Emergency Department (HOSPITAL_COMMUNITY)

## 2024-03-23 ENCOUNTER — Other Ambulatory Visit: Payer: Self-pay

## 2024-03-23 ENCOUNTER — Encounter (HOSPITAL_COMMUNITY): Payer: Self-pay | Admitting: Internal Medicine

## 2024-03-23 ENCOUNTER — Inpatient Hospital Stay (HOSPITAL_COMMUNITY)
Admission: EM | Admit: 2024-03-23 | Discharge: 2024-03-31 | DRG: 554 | Disposition: A | Discharge status: Home / Self Care | Attending: Internal Medicine | Admitting: Internal Medicine

## 2024-03-23 DIAGNOSIS — Z7985 Long-term (current) use of injectable non-insulin antidiabetic drugs: Secondary | ICD-10-CM

## 2024-03-23 DIAGNOSIS — M109 Gout, unspecified: Secondary | ICD-10-CM | POA: Diagnosis present

## 2024-03-23 DIAGNOSIS — G8929 Other chronic pain: Secondary | ICD-10-CM | POA: Diagnosis present

## 2024-03-23 DIAGNOSIS — E039 Hypothyroidism, unspecified: Secondary | ICD-10-CM | POA: Diagnosis present

## 2024-03-23 DIAGNOSIS — M48061 Spinal stenosis, lumbar region without neurogenic claudication: Secondary | ICD-10-CM | POA: Diagnosis present

## 2024-03-23 DIAGNOSIS — Z833 Family history of diabetes mellitus: Secondary | ICD-10-CM

## 2024-03-23 DIAGNOSIS — F32A Depression, unspecified: Secondary | ICD-10-CM | POA: Diagnosis present

## 2024-03-23 DIAGNOSIS — M7989 Other specified soft tissue disorders: Principal | ICD-10-CM

## 2024-03-23 DIAGNOSIS — Z8249 Family history of ischemic heart disease and other diseases of the circulatory system: Secondary | ICD-10-CM

## 2024-03-23 DIAGNOSIS — Z7401 Bed confinement status: Secondary | ICD-10-CM

## 2024-03-23 DIAGNOSIS — R7982 Elevated C-reactive protein (CRP): Secondary | ICD-10-CM | POA: Diagnosis present

## 2024-03-23 DIAGNOSIS — Z555 Less than a high school diploma: Secondary | ICD-10-CM

## 2024-03-23 DIAGNOSIS — E66812 Obesity, class 2: Secondary | ICD-10-CM | POA: Diagnosis present

## 2024-03-23 DIAGNOSIS — F419 Anxiety disorder, unspecified: Secondary | ICD-10-CM | POA: Diagnosis present

## 2024-03-23 DIAGNOSIS — Z79899 Other long term (current) drug therapy: Secondary | ICD-10-CM

## 2024-03-23 DIAGNOSIS — Z886 Allergy status to analgesic agent status: Secondary | ICD-10-CM

## 2024-03-23 DIAGNOSIS — N529 Male erectile dysfunction, unspecified: Secondary | ICD-10-CM | POA: Diagnosis present

## 2024-03-23 DIAGNOSIS — Z7982 Long term (current) use of aspirin: Secondary | ICD-10-CM

## 2024-03-23 DIAGNOSIS — K8020 Calculus of gallbladder without cholecystitis without obstruction: Secondary | ICD-10-CM | POA: Diagnosis present

## 2024-03-23 DIAGNOSIS — I10 Essential (primary) hypertension: Secondary | ICD-10-CM | POA: Diagnosis present

## 2024-03-23 DIAGNOSIS — D649 Anemia, unspecified: Secondary | ICD-10-CM | POA: Diagnosis present

## 2024-03-23 DIAGNOSIS — Z888 Allergy status to other drugs, medicaments and biological substances status: Secondary | ICD-10-CM

## 2024-03-23 DIAGNOSIS — Z87891 Personal history of nicotine dependence: Secondary | ICD-10-CM

## 2024-03-23 DIAGNOSIS — Z7984 Long term (current) use of oral hypoglycemic drugs: Secondary | ICD-10-CM

## 2024-03-23 DIAGNOSIS — F1411 Cocaine abuse, in remission: Secondary | ICD-10-CM | POA: Diagnosis present

## 2024-03-23 DIAGNOSIS — R479 Unspecified speech disturbances: Secondary | ICD-10-CM | POA: Diagnosis present

## 2024-03-23 DIAGNOSIS — E441 Mild protein-calorie malnutrition: Secondary | ICD-10-CM | POA: Diagnosis present

## 2024-03-23 DIAGNOSIS — Z96643 Presence of artificial hip joint, bilateral: Secondary | ICD-10-CM | POA: Diagnosis present

## 2024-03-23 DIAGNOSIS — E119 Type 2 diabetes mellitus without complications: Secondary | ICD-10-CM | POA: Diagnosis present

## 2024-03-23 DIAGNOSIS — M1990 Unspecified osteoarthritis, unspecified site: Secondary | ICD-10-CM | POA: Diagnosis present

## 2024-03-23 DIAGNOSIS — Z91013 Allergy to seafood: Secondary | ICD-10-CM

## 2024-03-23 DIAGNOSIS — F1011 Alcohol abuse, in remission: Secondary | ICD-10-CM | POA: Diagnosis present

## 2024-03-23 DIAGNOSIS — R262 Difficulty in walking, not elsewhere classified: Secondary | ICD-10-CM | POA: Diagnosis present

## 2024-03-23 DIAGNOSIS — E785 Hyperlipidemia, unspecified: Secondary | ICD-10-CM | POA: Diagnosis present

## 2024-03-23 DIAGNOSIS — R918 Other nonspecific abnormal finding of lung field: Secondary | ICD-10-CM | POA: Diagnosis present

## 2024-03-23 DIAGNOSIS — L03116 Cellulitis of left lower limb: Secondary | ICD-10-CM | POA: Diagnosis present

## 2024-03-23 DIAGNOSIS — D75839 Thrombocytosis, unspecified: Secondary | ICD-10-CM | POA: Diagnosis present

## 2024-03-23 DIAGNOSIS — E1100 Type 2 diabetes mellitus with hyperosmolarity without nonketotic hyperglycemic-hyperosmolar coma (NKHHC): Secondary | ICD-10-CM

## 2024-03-23 DIAGNOSIS — N289 Disorder of kidney and ureter, unspecified: Secondary | ICD-10-CM | POA: Diagnosis present

## 2024-03-23 DIAGNOSIS — D573 Sickle-cell trait: Secondary | ICD-10-CM | POA: Diagnosis present

## 2024-03-23 DIAGNOSIS — Z79890 Hormone replacement therapy (postmenopausal): Secondary | ICD-10-CM

## 2024-03-23 DIAGNOSIS — M1A9XX0 Chronic gout, unspecified, without tophus (tophi): Principal | ICD-10-CM | POA: Diagnosis present

## 2024-03-23 DIAGNOSIS — Z9109 Other allergy status, other than to drugs and biological substances: Secondary | ICD-10-CM

## 2024-03-23 DIAGNOSIS — I69351 Hemiplegia and hemiparesis following cerebral infarction affecting right dominant side: Secondary | ICD-10-CM

## 2024-03-23 DIAGNOSIS — E876 Hypokalemia: Secondary | ICD-10-CM | POA: Diagnosis present

## 2024-03-23 DIAGNOSIS — Z6836 Body mass index (BMI) 36.0-36.9, adult: Secondary | ICD-10-CM

## 2024-03-23 LAB — CBC
HCT: 35.2 % — ABNORMAL LOW (ref 39.0–52.0)
Hemoglobin: 11.7 g/dL — ABNORMAL LOW (ref 13.0–17.0)
MCH: 28.4 pg (ref 26.0–34.0)
MCHC: 33.2 g/dL (ref 30.0–36.0)
MCV: 85.4 fL (ref 80.0–100.0)
Platelets: 427 K/uL — ABNORMAL HIGH (ref 150–400)
RBC: 4.12 MIL/uL — ABNORMAL LOW (ref 4.22–5.81)
RDW: 17.7 % — ABNORMAL HIGH (ref 11.5–15.5)
WBC: 14.5 K/uL — ABNORMAL HIGH (ref 4.0–10.5)
nRBC: 0 % (ref 0.0–0.2)

## 2024-03-23 LAB — HEPATIC FUNCTION PANEL
ALT: 28 U/L (ref 0–44)
AST: 40 U/L (ref 15–41)
Albumin: 3.2 g/dL — ABNORMAL LOW (ref 3.5–5.0)
Alkaline Phosphatase: 123 U/L (ref 38–126)
Bilirubin, Direct: 0.5 mg/dL — ABNORMAL HIGH (ref 0.0–0.2)
Indirect Bilirubin: 0.6 mg/dL (ref 0.3–0.9)
Total Bilirubin: 1.1 mg/dL (ref 0.0–1.2)
Total Protein: 7.2 g/dL (ref 6.5–8.1)

## 2024-03-23 LAB — URINE DRUG SCREEN
Amphetamines: NEGATIVE
Barbiturates: NEGATIVE
Benzodiazepines: NEGATIVE
Cocaine: NEGATIVE
Fentanyl: NEGATIVE
Methadone Scn, Ur: NEGATIVE
Opiates: POSITIVE — AB
Tetrahydrocannabinol: NEGATIVE

## 2024-03-23 LAB — BASIC METABOLIC PANEL WITH GFR
Anion gap: 15 (ref 5–15)
BUN: 8 mg/dL (ref 8–23)
CO2: 20 mmol/L — ABNORMAL LOW (ref 22–32)
Calcium: 9.1 mg/dL (ref 8.9–10.3)
Chloride: 105 mmol/L (ref 98–111)
Creatinine, Ser: 0.96 mg/dL (ref 0.61–1.24)
GFR, Estimated: >60 mL/min (ref >60)
Glucose, Bld: 109 mg/dL — ABNORMAL HIGH (ref 70–99)
Potassium: 3.7 mmol/L (ref 3.5–5.1)
Sodium: 140 mmol/L (ref 135–145)

## 2024-03-23 LAB — LIPASE, BLOOD: Lipase: 25 U/L (ref 11–51)

## 2024-03-23 LAB — URIC ACID: Uric Acid, Serum: 6.7 mg/dL (ref 3.7–8.6)

## 2024-03-23 LAB — GLUCOSE, CAPILLARY
Glucose-Capillary: 95 mg/dL (ref 70–99)
Glucose-Capillary: 93 mg/dL (ref 70–99)

## 2024-03-23 MED ORDER — ONDANSETRON HCL 4 MG PO TABS: 4.0000 mg | ORAL_TABLET | Freq: Four times a day (QID) | ORAL | Status: DC | PRN

## 2024-03-23 MED ORDER — INSULIN ASPART 100 UNIT/ML IJ SOLN
0.0000 [IU] | Freq: Three times a day (TID) | INTRAMUSCULAR | Status: DC
  Administered 2024-03-25: 3 [IU] via SUBCUTANEOUS
  Filled 2024-03-23: qty 3

## 2024-03-23 MED ORDER — LEVOTHYROXINE SODIUM 25 MCG PO TABS
137.0000 ug | ORAL_TABLET | Freq: Every day | ORAL | Status: DC
  Administered 2024-03-24 – 2024-03-31 (×8): 137 ug via ORAL
  Filled 2024-03-23 (×9): qty 1

## 2024-03-23 MED ORDER — OXYCODONE HCL 5 MG PO TABS
5.0000 mg | ORAL_TABLET | Freq: Once | ORAL | Status: AC
  Administered 2024-03-23: 5 mg via ORAL
  Filled 2024-03-23: qty 1

## 2024-03-23 MED ORDER — OXYCODONE HCL 5 MG PO TABS: 5.0000 mg | ORAL_TABLET | ORAL | Status: DC | PRN

## 2024-03-23 MED ORDER — FOLIC ACID 1 MG PO TABS
1.0000 mg | ORAL_TABLET | Freq: Every day | ORAL | Status: DC
  Administered 2024-03-24 – 2024-03-31 (×8): 1 mg via ORAL
  Filled 2024-03-23 (×8): qty 1

## 2024-03-23 MED ORDER — EMPAGLIFLOZIN 25 MG PO TABS
25.0000 mg | ORAL_TABLET | Freq: Every day | ORAL | Status: DC
  Administered 2024-03-24 – 2024-03-31 (×8): 25 mg via ORAL
  Filled 2024-03-23 (×8): qty 1

## 2024-03-23 MED ORDER — ACETAMINOPHEN 325 MG PO TABS
650.0000 mg | ORAL_TABLET | Freq: Four times a day (QID) | ORAL | Status: DC | PRN
  Administered 2024-03-23 – 2024-03-24 (×2): 650 mg via ORAL
  Filled 2024-03-23 (×2): qty 2

## 2024-03-23 MED ORDER — ENOXAPARIN SODIUM 40 MG/0.4ML IJ SOSY: 40.0000 mg | PREFILLED_SYRINGE | INTRAMUSCULAR | Status: DC

## 2024-03-23 MED ORDER — MORPHINE SULFATE (PF) 4 MG/ML IV SOLN
4.0000 mg | Freq: Once | INTRAVENOUS | Status: AC
  Administered 2024-03-23: 4 mg via INTRAVENOUS
  Filled 2024-03-23: qty 1

## 2024-03-23 MED ORDER — COLCHICINE 0.6 MG PO TABS
0.6000 mg | ORAL_TABLET | Freq: Two times a day (BID) | ORAL | Status: DC
  Administered 2024-03-23 – 2024-03-31 (×16): 0.6 mg via ORAL
  Filled 2024-03-23 (×16): qty 1

## 2024-03-23 MED ORDER — TRAZODONE HCL 100 MG PO TABS
200.0000 mg | ORAL_TABLET | Freq: Every day | ORAL | Status: DC
  Administered 2024-03-23 – 2024-03-30 (×8): 200 mg via ORAL
  Filled 2024-03-23 (×8): qty 2

## 2024-03-23 MED ORDER — SODIUM CHLORIDE 0.9 % IV SOLN
2.0000 g | INTRAVENOUS | Status: DC
  Administered 2024-03-24 – 2024-03-26 (×3): 2 g via INTRAVENOUS
  Filled 2024-03-23 (×3): qty 20

## 2024-03-23 MED ORDER — ATORVASTATIN CALCIUM 10 MG PO TABS
10.0000 mg | ORAL_TABLET | Freq: Every day | ORAL | Status: DC
  Administered 2024-03-23 – 2024-03-30 (×8): 10 mg via ORAL
  Filled 2024-03-23 (×8): qty 1

## 2024-03-23 MED ORDER — ENOXAPARIN SODIUM 60 MG/0.6ML IJ SOSY
60.0000 mg | PREFILLED_SYRINGE | INTRAMUSCULAR | Status: DC
  Administered 2024-03-23 – 2024-03-28 (×6): 60 mg via SUBCUTANEOUS
  Filled 2024-03-23 (×6): qty 0.6

## 2024-03-23 MED ORDER — METOPROLOL SUCCINATE ER 25 MG PO TB24
25.0000 mg | ORAL_TABLET | Freq: Two times a day (BID) | ORAL | Status: DC
  Administered 2024-03-23 – 2024-03-31 (×16): 25 mg via ORAL
  Filled 2024-03-23 (×16): qty 1

## 2024-03-23 MED ORDER — POLYETHYLENE GLYCOL 3350 17 G PO PACK: 17.0000 g | PACK | Freq: Every day | ORAL | Status: DC | PRN

## 2024-03-23 MED ORDER — OXYCODONE HCL 5 MG PO TABS
10.0000 mg | ORAL_TABLET | Freq: Four times a day (QID) | ORAL | Status: DC | PRN
  Administered 2024-03-23 – 2024-03-31 (×28): 10 mg via ORAL
  Filled 2024-03-23 (×29): qty 2

## 2024-03-23 MED ORDER — ONDANSETRON HCL 4 MG/2ML IJ SOLN: 4.0000 mg | Freq: Four times a day (QID) | INTRAMUSCULAR | Status: DC | PRN

## 2024-03-23 MED ORDER — SODIUM CHLORIDE 0.9 % IV SOLN
2.0000 g | Freq: Once | INTRAVENOUS | Status: AC
  Administered 2024-03-23: 2 g via INTRAVENOUS
  Filled 2024-03-23: qty 20

## 2024-03-23 MED ORDER — ONDANSETRON 4 MG PO TBDP: 8.0000 mg | ORAL_TABLET | Freq: Once | ORAL | Status: DC

## 2024-03-23 MED ORDER — ACETAMINOPHEN 650 MG RE SUPP
650.0000 mg | Freq: Four times a day (QID) | RECTAL | Status: DC | PRN
Start: 2024-03-23 — End: 2024-03-31

## 2024-03-23 MED ORDER — MORPHINE SULFATE (PF) 4 MG/ML IV SOLN
4.0000 mg | INTRAVENOUS | Status: AC | PRN
Start: 2024-03-23 — End: 2024-03-24
  Administered 2024-03-23 – 2024-03-24 (×3): 4 mg via INTRAVENOUS
  Filled 2024-03-23 (×3): qty 1

## 2024-03-23 MED ORDER — ASPIRIN 325 MG PO TBEC
325.0000 mg | DELAYED_RELEASE_TABLET | Freq: Every day | ORAL | Status: DC
  Administered 2024-03-24 – 2024-03-31 (×8): 325 mg via ORAL
  Filled 2024-03-23 (×9): qty 1

## 2024-03-23 MED ORDER — OXYCODONE HCL 5 MG PO TABS
5.0000 mg | ORAL_TABLET | Freq: Four times a day (QID) | ORAL | Status: DC | PRN
  Administered 2024-03-23: 5 mg via ORAL
  Filled 2024-03-23: qty 1

## 2024-03-23 NOTE — ED Provider Notes (Signed)
 [place] EMERGENCY DEPARTMENT AT [facility] Provider Note   CSN: [id number] Arrival date & time: 03/23/24  9063     Patient presents with: Gout  HPI Phillip Turner is a 63 y.o. male with h/o gout, type 2 diabetes, hypothyroidism, stroke, hypertension presenting for left foot pain and swelling.  He states symptoms started about 6 days ago.  He is concerned it may be gout but unsure given that he has been taking colchicine  and it has not improved.  Also reports that he is developed right sided lumbar thoracic back pain.  Denies abdominal pain, nausea vomiting diarrhea.  Pain in the foot is become unbearable and he is having difficulty ambulating at this time.  He denies fever.   HPI     Prior to Admission medications   Medication Sig Start Date End Date Taking? Authorizing Provider  acetaminophen  (TYLENOL ) 500 MG tablet Take 2,000-3,000 mg by mouth daily as needed for mild pain or headache.     [provider]  amLODipine  (NORVASC ) 10 MG tablet Take 10 mg by mouth at bedtime.    [provider]  amoxicillin  (AMOXIL ) 500 MG capsule Take 1 capsule (500 mg total) by mouth 3 (three) times daily. 02/07/19   Bernard Drivers, MD  aspirin  EC 325 MG tablet Take 1 tablet (325 mg total) by mouth daily. For 30 days postop for DVT prophylaxis.  Resume home dose when complete. Patient taking differently: Take 325 mg by mouth daily.  03/26/16   Martensen, Victory Muller III, PA-C  atorvastatin  (LIPITOR) 10 MG tablet Take 10 mg by mouth at bedtime.     [provider]  baclofen  (LIORESAL ) 20 MG tablet Take 20 mg by mouth 2 (two) times daily.  04/01/18   [provider]  colchicine  0.6 MG tablet Take 0.6 mg by mouth 2 (two) times daily.    [provider]  cyclobenzaprine  (FLEXERIL ) 10 MG tablet Take 1 tablet (10 mg total) by mouth 3 (three) times daily as needed for muscle spasms. 11/27/18   Bergman, Meghan D, NP  Dulaglutide  (TRULICITY ) 0.75  MG/0.5ML SOPN Inject 0.75 mg into the skin every Wednesday.    [provider]  Dulaglutide  (TRULICITY ) 1.5 MG/0.5ML SOPN Inject 1.5 mg into the skin once a week. 12/20/22     Dulaglutide  (TRULICITY ) 3 MG/0.5ML SOAJ Inject 3 mg into the skin once a week. 01/05/24     EDARBYCLOR 40-25 MG TABS Take 1 tablet by mouth daily.  09/04/15   [provider]  empagliflozin (JARDIANCE) 25 MG TABS tablet Take 25 mg by mouth daily.     [provider]  folic acid  (FOLVITE ) 1 MG tablet Take 1 mg by mouth daily.    [provider]  glipiZIDE  (GLUCOTROL ) 5 MG tablet Take 5 mg by mouth 2 (two) times daily before a meal.     [provider]  hydroxypropyl methylcellulose / hypromellose (ISOPTO TEARS / GONIOVISC) 2.5 % ophthalmic solution Place 2 drops into both eyes as needed (red eyes).    [provider]  levothyroxine  (SYNTHROID , LEVOTHROID) 100 MCG tablet Take 100 mcg by mouth daily.  08/04/15   [provider]  metoprolol  succinate (TOPROL -XL) 25 MG 24 hr tablet Take 25 mg by mouth 2 (two) times daily.     [provider]  oxyCODONE  10 MG TABS Take 1 tablet (10 mg total) by mouth every 4 (four) hours as needed for severe pain ((score 7 to 10)). 11/27/18  Bergman, Meghan D, NP  oxyCODONE -acetaminophen  (PERCOCET) 10-325 MG tablet Take 1 tablet by mouth every 6 (six) hours (four times daily) as needed for pain. 08/20/23     oxyCODONE -acetaminophen  (PERCOCET) 10-325 MG tablet Take 1 tablet by mouth every 6 (six) hours as needed for pain ** DNF 11/06/2023** 11/04/23     oxyCODONE -acetaminophen  (PERCOCET) 10-325 MG tablet Take 1 tablet by mouth every 6 (six) hours (four times daily) as needed for pain. 11/04/23     oxyCODONE -acetaminophen  (PERCOCET) 10-325 MG tablet Take 1 tablet by mouth every 6 (six) hours as needed for pain **DNF 12/06/2023** 11/04/23     oxyCODONE -acetaminophen  (PERCOCET) 10-325 MG tablet Take 1 tablet by mouth every 6 (six) hours as needed for  pain. 04/04/24     oxyCODONE -acetaminophen  (PERCOCET) 10-325 MG tablet Take 1 tablet by mouth  every 6 hours (four times daily) as needed for pain. 02/04/24     oxyCODONE -acetaminophen  (PERCOCET) 10-325 MG tablet Take 1 tablet by mouth every 6 (six) hours as needed for pain. 03/05/24     penicillin  v potassium (VEETID) 500 MG tablet Take 500 mg by mouth 4 (four) times daily.    [provider]  potassium chloride  SA (K-DUR) 20 MEQ tablet Take 20 mEq by mouth See admin instructions. Take 20 meq twice daily, may take a third 20 meq dose as needed for rapid heart rate    [provider]  sildenafil (VIAGRA) 100 MG tablet Take 100 mg by mouth as needed for erectile dysfunction.    [provider]  traZODone  (DESYREL ) 100 MG tablet Take 200 mg by mouth at bedtime.    [provider]  Vitamin D , Ergocalciferol , (DRISDOL ) 1.25 MG (50000 UT) CAPS capsule Take 50,000 Units by mouth every Tuesday.    [provider]    Allergies: Clonidine  derivatives, Shellfish allergy, Naproxen , Indomethacin , Invokana  [canagliflozin ], Metformin  and related, and Other    Review of Systems See HPI  Updated Vital Signs BP (!) 114/56   Pulse 78   Temp 97.9 F (36.6 C) (Oral)   Resp 18   SpO2 98%   Physical Exam Vitals and nursing note reviewed.  HENT:     Head: Normocephalic and atraumatic.     Mouth/Throat:     Mouth: Mucous membranes are moist.  Eyes:     General:        Right eye: No discharge.        Left eye: No discharge.     Conjunctiva/sclera: Conjunctivae normal.  Cardiovascular:     Rate and Rhythm: Normal rate and regular rhythm.     Pulses: Normal pulses.     Heart sounds: Normal heart sounds.  Pulmonary:     Effort: Pulmonary effort is normal.     Breath sounds: Normal breath sounds.  Abdominal:     General: Abdomen is flat. There is no distension.     Palpations: Abdomen is soft.     Tenderness: There is no abdominal tenderness.   Musculoskeletal:     Left foot: Swelling present.     Comments: Swelling, erythema and tenderness.  The foot is warm to touch.  Pedal pulses are 2+ bilaterally.  No open wounds or pustulant discharge.  See picture below.  Active ROM of the ankle normal.  Skin:    General: Skin is warm and dry.  Neurological:     General: No focal deficit present.  Psychiatric:        Mood and Affect: Mood normal.        (  all labs ordered are listed, but only abnormal results are displayed) Labs Reviewed  BASIC METABOLIC PANEL WITH GFR - Abnormal; Notable for the following components:      Result Value   CO2 20 (*)    Glucose, Bld 109 (*)    All other components within normal limits  CBC - Abnormal; Notable for the following components:   WBC 14.5 (*)    RBC 4.12 (*)    Hemoglobin 11.7 (*)    HCT 35.2 (*)    RDW 17.7 (*)    Platelets 427 (*)    All other components within normal limits  URIC ACID  LIPASE, BLOOD  HEPATIC FUNCTION PANEL  URINE DRUG SCREEN    EKG: None  Radiology: US  Abdomen Limited RUQ (LIVER/GB) Result Date: 03/23/2024 EXAM: Right Upper Quadrant Abdominal Ultrasound 03/23/2024 01:30:11 PM TECHNIQUE: Real-time ultrasonography of the right upper quadrant of the abdomen was performed. COMPARISON: None available. CLINICAL HISTORY: Abdominal pain. FINDINGS: LIVER: Increased echogenicity of hepatic parenchyma is noted suggesting hepatic steatosis. No intrahepatic biliary ductal dilatation. No evidence of mass. BILIARY SYSTEM: Gallbladder wall thickness measures 2.0 mm. Probable mild cholelithiasis or sludge is noted. No pericholecystic fluid. Negative sonographic Murphy's sign. Common bile duct measures 5.0 mm. OTHER: No right upper quadrant ascites. IMPRESSION: 1. Probable mild cholelithiasis or sludge. 2. Hepatic steatosis. Electronically signed by: Lynwood Seip MD 03/23/2024 01:46 PM EST RP Workstation: [id number]   DG Foot Complete Left Result Date: 03/23/2024 CLINICAL  DATA:  Left foot pain and swelling. EXAM: LEFT FOOT - COMPLETE 3+ VIEW COMPARISON:  None Available. FINDINGS: No acute fracture or dislocation. The bones are mildly osteopenic. Degenerative changes of the midfoot and spurring of the dorsum of the tarsal bone. There is diffuse soft tissue swelling and subcutaneous edema predominantly involving the dorsum of the foot. No radiopaque foreign object or soft tissue gas. IMPRESSION: 1. No acute fracture or dislocation. 2. Diffuse soft tissue swelling and subcutaneous edema. Findings may represent cellulitis. Clinical correlation is recommended. Electronically Signed   By: Vanetta Chou M.D.   On: 03/23/2024 11:31   CT Chest Wo Contrast Result Date: 03/23/2024 CLINICAL DATA:  Blunt trauma to chest with right posterior rib pain. EXAM: CT CHEST WITHOUT CONTRAST TECHNIQUE: Multidetector CT imaging of the chest was performed following the standard protocol without IV contrast. RADIATION DOSE REDUCTION: This exam was performed according to the departmental dose-optimization program which includes automated exposure control, adjustment of the mA and/or kV according to patient size and/or use of iterative reconstruction technique. COMPARISON:  CT abdomen 11/28/2017 FINDINGS: Cardiovascular: Heart is normal size. Thoracic aorta is normal caliber. Pulmonary arterial system and remaining vascular structures are unremarkable on this noncontrast exam. Mediastinum/Nodes: No mediastinal or hilar adenopathy. Remaining mediastinal structures are normal. Lungs/Pleura: Lungs are adequately inflated without acute airspace process or effusion. Two adjacent 2 mm nodules over the right upper lobe. Tiny amount of right basilar pleural fluid with associated atelectasis. Airways are normal. Upper Abdomen: Moderate cholelithiasis. 1.7 cm hypodensity over the upper pole left kidney with Hounsfield unit measurements of 23 likely a no acute findings over the visualized upper abdomen.  Musculoskeletal: No focal abnormality. IMPRESSION: 1. No acute cardiopulmonary disease. No acute fracture. 2. Two adjacent 2 mm nodules over the right upper lobe. No follow-up needed if patient is low-risk (and has no known or suspected primary neoplasm). Non-contrast chest CT can be considered in 12 months if patient is high-risk. This recommendation follows the consensus statement: Guidelines for Management of  Incidental Pulmonary Nodules Detected on CT Images: From the Fleischner Society 2017; Radiology 2017; 284:228-243. 3. Moderate cholelithiasis. 4. 1.7 cm hypodensity over the upper pole left kidney likely a cyst. Recommend renal ultrasound for further evaluation. Electronically Signed   By: Toribio Agreste M.D.   On: 03/23/2024 11:27   CT T-SPINE NO CHARGE Result Date: 03/23/2024 CLINICAL DATA:  Chest trauma.  Right posterolateral rib pain. EXAM: CT Thoracic Spine without contrast TECHNIQUE: Multiplanar CT images of the thoracic spine were reconstructed from contemporary CT of the Chest. RADIATION DOSE REDUCTION: This exam was performed according to the departmental dose-optimization program which includes automated exposure control, adjustment of the mA and/or kV according to patient size and/or use of iterative reconstruction technique. CONTRAST:  None COMPARISON:  Concurrent chest CT. Abdominopelvic CT 11/28/2017. Lumbar MRI 03/23/2020. FINDINGS: Alignment: Conventional anatomy with 12 rib-bearing thoracic type vertebral bodies. Mild convex right scoliosis. No focal angulation or listhesis. Vertebrae: No evidence of acute thoracic spine fracture or traumatic subluxation. No aggressive osseous lesions. Prominent paraspinal osteophytes consistent with diffuse idiopathic skeletal hyperostosis. Paraspinal and other soft tissues: No acute paraspinal findings are identified. Chest details dictated separately. Disc levels: No large disc herniation or high-grade spinal stenosis demonstrated within the thoracic  spine. There is lower cervical spondylosis with disc bulging, endplate osteophytes and uncinate spurring at C6-7 and C7-T1. Previous left laminectomy at L1-2 with associated circumferential osteophytes and mild canal narrowing, similar to previous MRI. IMPRESSION: 1. No evidence of acute thoracic spine fracture, traumatic subluxation or static signs of instability. 2. Diffuse idiopathic skeletal hyperostosis. 3. Grossly stable postsurgical changes at L1-2. Electronically Signed   By: Elsie Perone M.D.   On: 03/23/2024 11:26     Procedures   Medications Ordered in the ED  ondansetron  (ZOFRAN -ODT) disintegrating tablet 8 mg (8 mg Oral Patient Refused/Not Given 03/23/24 1330)  morphine  (PF) 4 MG/ML injection 4 mg (4 mg Intravenous Given 03/23/24 1126)  cefTRIAXone (ROCEPHIN) 2 g in sodium chloride  0.9 % 100 mL IVPB (2 g Intravenous New Bag/Given 03/23/24 1332)                                    Medical Decision Making Amount and/or Complexity of Data Reviewed Labs: ordered. Radiology: ordered.  Risk Prescription drug management. Decision regarding hospitalization.   Initial Impression and Ddx 63 year old well-appearing male presenting for left foot pain and swelling primarily and then also right sided back pain.  Exam notable for extensive swelling erythema warmth and tenderness in the right foot.  DDx includes cellulitis, gout, septic arthritis, other.  DDx for the back pain includes acute cholecystitis, kidney stone, PE, MSK, other. Patient PMH that increases complexity of ED encounter:   h/o gout, type 2 diabetes, hypothyroidism, stroke, hypertension  Interpretation of Diagnostics - I independent reviewed and interpreted the labs as followed: WBC 14.5  - I independently visualized the following imaging with scope of interpretation limited to determining acute life threatening conditions related to emergency care: CT chest w/o, which revealed showing gallstone and right upper lobe  pulmonary nodule.  Shared findings with patient.  Right upper quadrant ultrasound showed gallstones as well but no evidence of acute cholecystitis.  Left foot x-ray showing signs of cellulitis.  Patient Reassessment and Ultimate Disposition/Management Workup suggestive of cellulitis without sepsis.  Given the extensive erythema and swelling along with increased white count felt he would benefit from IV antibiotics.  Also  considered septic arthritis but was able to effectively range the foot and ankle with did elicit some tenderness but grossly normal.  Also the swelling and redness seems to be more concentrated in the midfoot.  Admitted to hospital service with Dr. Celinda.  Given 1 dose of IV ceftriaxone.  Also suspicion for acute cholecystitis is low given reassuring ultrasound and no right upper quadrant abdominal tenderness on exam.  Patient management required discussion with the following services or consulting groups:  None  Complexity of Problems Addressed Acute complicated illness or Injury  Additional Data Reviewed and Analyzed Further history obtained from: Past medical history and medications listed in the EMR and Prior ED visit notes  Patient Encounter Risk Assessment Consideration of hospitalization      Final diagnoses:  Foot swelling    ED Discharge Orders     None          Lang Norleen POUR, PA-C 03/23/24 1424    Cottie Donnice PARAS, MD 03/23/24 1531

## 2024-03-23 NOTE — ED Provider Triage Note (Signed)
 Emergency Medicine Provider Triage Evaluation Note  Phillip Turner , a 63 y.o. male  was evaluated in triage.  Pt complains of left foot redness, pain and swelling for 6 days, feels similar to when he had gout 10 years ago.  Also complains of right sided chest and rib pain for 5 days, worse with movement  Review of Systems  Positive: Chest pain, foot pain Negative: Fevers   Physical Exam  BP (!) 149/97 (BP Location: Left Arm)   Pulse 73   Temp 98.7 F (37.1 C) (Oral)   Resp 16   SpO2 94%  Gen:   Awake, distress with movement Resp:  Normal effort  MSK:   Right lateral/posterior chest wall pain, left foot erythema, warmth and dorsum swelling  Medical Decision Making  Medically screening exam initiated at 10:20 AM.  Appropriate orders placed.  Montreal R Kellogg was informed that the remainder of the evaluation will be completed by another provider, this initial triage assessment does not replace that evaluation, and the importance of remaining in the ED until their evaluation is complete.  Labs, imaging ordered   Cottie Donnice PARAS, MD 03/23/24 [phone]

## 2024-03-23 NOTE — Plan of Care (Signed)
 ?  Problem: Clinical Measurements: ?Goal: Ability to maintain clinical measurements within normal limits will improve ?Outcome: Progressing ?Goal: Will remain free from infection ?Outcome: Progressing ?Goal: Diagnostic test results will improve ?Outcome: Progressing ?  ?

## 2024-03-23 NOTE — H&P (Signed)
 History and Physical    Patient: Phillip Turner FMW:[id number] DOB: 1961-01-10 DOA: 03/23/2024 DOS: the patient was seen and examined on 03/23/2024 PCP: [name], MD  Patient coming from: Home  Chief Complaint:  Chief Complaint  Patient presents with   Gout   HPI: Phillip Turner is a 63 y.o. male with medical history significant of alcohol  abuse in remission, anxiety, arthritis, depression, type 2 diabetes, hyperlipidemia, hypertension, hypothyroidism, rheumatic fever as a teenager, sickle cell trait, history of CVA with mild residual right-sided weakness and occasional speech disturbance, history of cocaine  abuse in remission who presented to the emergency department with left foot pain and edema that he states is similar to his prior acute gout episodes for the past 7 days has not responded to his colchicine  use.  He has subjective fever and night sweats. He denied fever, chills, rhinorrhea, sore throat, wheezing or hemoptysis.  No chest pain, palpitations, diaphoresis, PND, orthopnea or pitting edema of the lower extremities.  No abdominal pain, nausea, emesis, diarrhea, constipation, melena or hematochezia.  No flank pain, dysuria, frequency or hematuria.  No polyuria, polydipsia, polyphagia or blurred vision.   Lab work: CBC showed white count of 14.5, hemoglobin 11.7 g/dL and platelets 572.  BMP/chemistry showed a CO2 of 20 mmol/L with a normal anion gap, glucose 109 and uric acid 6.7 mg/dL.  The rest of the electrolytes and renal function were normal.  Imaging: Left foot x-ray with no fracture or dislocation.  Diffuse soft tissue swelling and subcutaneous edema that may represent cellulitis.  CT chest without contrast no acute cardiopulmonary disease.  There are 2 adjacent 2 mm nodules over the right upper lobe.  No follow-up negative if the patient is low risk ((and has no known more suspected primary neoplasm).  If high risk noncontrast chest CT in 12 months.  Moderate  cholelithiasis.  1.7 cm left upper pole kidney lesion that is likely a cyst.  Ultrasound imaging recommended for further evaluation.   ED course: Initial vital signs were temperature 98.7 F, pulse 73, respirations 16, BP 149/97 mmHg and O2 sat 94% on room air.  The patient received ceftriaxone 2 g IVPB, morphine  4 mg IVP and ondansetron  8 mg ODT.  Review of Systems: As mentioned in the history of present illness. All other systems reviewed and are negative. Past Medical History:  Diagnosis Date   Anxiety    from past drug use not longer having anxiety   Arthritis    Depression    past depression relating to drug use    Diabetes mellitus    Gout    Hyperlipidemia    Hypertension    Hypothyroidism    Rheumatic fever    as a teenager   Sickle cell anemia (HCC)    trait carrier   Stroke [facility])    does not remember when, weakness on right side, some trouble speaking at times   Substance abuse (HCC)    former cocaine  user- not since March 2014   Past Surgical History:  Procedure Laterality Date   BACK SURGERY  2020   boil removed from tailbone      COLONOSCOPY     EYE SURGERY Right 2018   LUMBAR LAMINECTOMY/DECOMPRESSION MICRODISCECTOMY N/A 11/27/2018   Procedure: MICRODISCECTOMY REDO LEFT LUMBAR ONE- LUMBAR TWO, RIGHT LUMBAR THREE- LUMBAR FOUR;  Surgeon: Onetha Kuba, MD;  Location: [facility] OR;  Service: Neurosurgery;  Laterality: N/A;   TONSILLECTOMY     TOTAL HIP ARTHROPLASTY Left 11/14/2015  Procedure: LEFT TOTAL HIP ARTHROPLASTY ANTERIOR APPROACH;  Surgeon: Evalene JONETTA Chancy, MD;  Location: [facility] OR;  Service: Orthopedics;  Laterality: Left;   TOTAL HIP ARTHROPLASTY Right 03/26/2016   Procedure: RIGHT TOTAL HIP ARTHROPLASTY ANTERIOR APPROACH;  Surgeon: Evalene JONETTA Chancy, MD;  Location: [facility] OR;  Service: Orthopedics;  Laterality: Right;   Social History:  reports that he has quit smoking. His smoking use included cigarettes. He has never used smokeless tobacco. He reports that he does not  drink alcohol  and does not use drugs.  Allergies  Allergen Reactions   Clonidine  Derivatives Other (See Comments)    Slow heartbeat.   Shellfish Allergy Anaphylaxis and Nausea And Vomiting   Naproxen  Swelling   Indomethacin  Swelling   Invokana  [Canagliflozin ] Other (See Comments)    Headaches   Metformin  And Related Nausea And Vomiting    Patient states it makes him sick   Other Itching, Swelling and Rash    Stainless steel.     Not pure metals; cheap metals    Family History  Problem Relation Age of Onset   Coronary artery disease Mother    Diabetes type II Father    Hypertension Father    Colon cancer Neg Hx    Esophageal cancer Neg Hx    Rectal cancer Neg Hx    Stomach cancer Neg Hx     Prior to Admission medications   Medication Sig Start Date End Date Taking? Authorizing Provider  acetaminophen  (TYLENOL ) 500 MG tablet Take 2,000-3,000 mg by mouth daily as needed for mild pain or headache.     [provider]  amLODipine  (NORVASC ) 10 MG tablet Take 10 mg by mouth at bedtime.    [provider]  amoxicillin  (AMOXIL ) 500 MG capsule Take 1 capsule (500 mg total) by mouth 3 (three) times daily. 02/07/19   Bernard Drivers, MD  aspirin  EC 325 MG tablet Take 1 tablet (325 mg total) by mouth daily. For 30 days postop for DVT prophylaxis.  Resume home dose when complete. Patient taking differently: Take 325 mg by mouth daily.  03/26/16   Martensen, Victory Muller III, PA-C  atorvastatin  (LIPITOR) 10 MG tablet Take 10 mg by mouth at bedtime.     [provider]  baclofen  (LIORESAL ) 20 MG tablet Take 20 mg by mouth 2 (two) times daily.  04/01/18   [provider]  colchicine  0.6 MG tablet Take 0.6 mg by mouth 2 (two) times daily.    [provider]  cyclobenzaprine  (FLEXERIL ) 10 MG tablet Take 1 tablet (10 mg total) by mouth 3 (three) times daily as needed for muscle spasms. 11/27/18   Bergman, Meghan D, NP  Dulaglutide  (TRULICITY ) 0.75  MG/0.5ML SOPN Inject 0.75 mg into the skin every Wednesday.    [provider]  Dulaglutide  (TRULICITY ) 1.5 MG/0.5ML SOPN Inject 1.5 mg into the skin once a week. 12/20/22     Dulaglutide  (TRULICITY ) 3 MG/0.5ML SOAJ Inject 3 mg into the skin once a week. 01/05/24     EDARBYCLOR 40-25 MG TABS Take 1 tablet by mouth daily.  09/04/15   [provider]  empagliflozin (JARDIANCE) 25 MG TABS tablet Take 25 mg by mouth daily.     [provider]  folic acid  (FOLVITE ) 1 MG tablet Take 1 mg by mouth daily.    [provider]  glipiZIDE  (GLUCOTROL ) 5 MG tablet Take 5 mg by mouth 2 (two) times daily before a meal.     [provider]  hydroxypropyl  methylcellulose / hypromellose (ISOPTO TEARS / GONIOVISC) 2.5 % ophthalmic solution Place 2 drops into both eyes as needed (red eyes).    [provider]  levothyroxine  (SYNTHROID , LEVOTHROID) 100 MCG tablet Take 100 mcg by mouth daily.  08/04/15   [provider]  metoprolol  succinate (TOPROL -XL) 25 MG 24 hr tablet Take 25 mg by mouth 2 (two) times daily.     [provider]  oxyCODONE  10 MG TABS Take 1 tablet (10 mg total) by mouth every 4 (four) hours as needed for severe pain ((score 7 to 10)). 11/27/18   Bergman, Meghan D, NP  oxyCODONE -acetaminophen  (PERCOCET) 10-325 MG tablet Take 1 tablet by mouth every 6 (six) hours (four times daily) as needed for pain. 08/20/23     oxyCODONE -acetaminophen  (PERCOCET) 10-325 MG tablet Take 1 tablet by mouth every 6 (six) hours as needed for pain ** DNF 11/06/2023** 11/04/23     oxyCODONE -acetaminophen  (PERCOCET) 10-325 MG tablet Take 1 tablet by mouth every 6 (six) hours (four times daily) as needed for pain. 11/04/23     oxyCODONE -acetaminophen  (PERCOCET) 10-325 MG tablet Take 1 tablet by mouth every 6 (six) hours as needed for pain **DNF 12/06/2023** 11/04/23     oxyCODONE -acetaminophen  (PERCOCET) 10-325 MG tablet Take 1 tablet by mouth every 6 (six) hours as needed for  pain. 04/04/24     oxyCODONE -acetaminophen  (PERCOCET) 10-325 MG tablet Take 1 tablet by mouth  every 6 hours (four times daily) as needed for pain. 02/04/24     oxyCODONE -acetaminophen  (PERCOCET) 10-325 MG tablet Take 1 tablet by mouth every 6 (six) hours as needed for pain. 03/05/24     penicillin  v potassium (VEETID) 500 MG tablet Take 500 mg by mouth 4 (four) times daily.    [provider]  potassium chloride  SA (K-DUR) 20 MEQ tablet Take 20 mEq by mouth See admin instructions. Take 20 meq twice daily, may take a third 20 meq dose as needed for rapid heart rate    [provider]  sildenafil (VIAGRA) 100 MG tablet Take 100 mg by mouth as needed for erectile dysfunction.    [provider]  traZODone  (DESYREL ) 100 MG tablet Take 200 mg by mouth at bedtime.    [provider]  Vitamin D , Ergocalciferol , (DRISDOL ) 1.25 MG (50000 UT) CAPS capsule Take 50,000 Units by mouth every Tuesday.    [provider]    Physical Exam: Vitals:   03/23/24 0948 03/23/24 1333  BP: (!) 149/97 (!) 114/56  Pulse: 73 78  Resp: 16 18  Temp: 98.7 F (37.1 C) 97.9 F (36.6 C)  TempSrc: Oral Oral  SpO2: 94% 98%   Physical Exam Vitals and nursing note reviewed.  Constitutional:      Appearance: He is ill-appearing.  HENT:     Head: Normocephalic.     Nose: No rhinorrhea.  Eyes:     General: No scleral icterus.    Pupils: Pupils are equal, round, and reactive to light.  Cardiovascular:     Rate and Rhythm: Normal rate and regular rhythm.  Pulmonary:     Effort: Pulmonary effort is normal.     Breath sounds: Normal breath sounds. No wheezing, rhonchi or rales.  Abdominal:     General: Bowel sounds are normal. There is no distension.     Palpations: Abdomen is soft.     Tenderness: There is no abdominal tenderness. There is no right CVA tenderness or left CVA tenderness.  Musculoskeletal:     Cervical back:  Neck supple.     Right lower leg: No edema.      Left lower leg: No edema.  Skin:    General: Skin is warm and dry.  Neurological:     General: No focal deficit present.     Mental Status: He is alert and oriented to person, place, and time.  Psychiatric:        Mood and Affect: Mood normal.        Behavior: Behavior normal.        Data Reviewed:  Results are pending, will review when available.   Assessment and Plan: Principal Problem:   Cellulitis of left foot Observation/MedSurg. Analgesics as needed. Antiemetics as needed. Continue ceftriaxone 1 g IVPB daily. Check DVT Doppler of LLE. Follow CBC, CMP in AM.  Active Problems:   Depression Continue trazodone  100 mg p.o. at bedtime.    Type 2 diabetes mellitus (HCC) On dulaglutide  at home. Carbohydrate modified diet. CBG monitoring with [facility] while in the hospital. Check hemoglobin A1c.    Class II obesity Current BMI 36.22 kg/m. Continue dulaglutide . Continue lifestyle modifications. Follow-up closely with PCP and/or bariatric clinic.    Hyperlipidemia Continue atorvastatin  10 mg p.o. daily.    Hypothyroidism Continue levothyroxine  137 mcg p.o. daily.    HTN (hypertension) Continue metoprolol  succinate 25 mg p.o. twice daily.    Pulmonary nodules Follow-up imaging in 1 year. Follow-up with PCP.    Normocytic anemia Monitor hematocrit and hemoglobin.    Thrombocytosis In the setting of anemia and acute infection. Follow-up platelet plan.    Lumbar stenosis without neurogenic claudication Continue analgesics as needed.    Mild protein malnutrition In the setting of acute infection. Continue current treatment. Follow-up albumin  level.    Advance Care Planning:   Code Status: Full Code   Consults:   Family Communication:   Severity of Illness: The appropriate patient status for this patient is INPATIENT. Inpatient status is judged to be reasonable and necessary in order to provide the required intensity of service to ensure the  patient's safety. The patient's presenting symptoms, physical exam findings, and initial radiographic and laboratory data in the context of their chronic comorbidities is felt to place them at high risk for further clinical deterioration. Furthermore, it is not anticipated that the patient will be medically stable for discharge from the hospital within 2 midnights of admission.   * I certify that at the point of admission it is my clinical judgment that the patient will require inpatient hospital care spanning beyond 2 midnights from the point of admission due to high intensity of service, high risk for further deterioration and high frequency of surveillance required.*  Author: Alm Dorn Castor, MD 03/23/2024 2:04 PM  For on call review [email or web address].   This document was prepared using Dragon voice recognition software and may contain some unintended transcription errors.

## 2024-03-23 NOTE — ED Triage Notes (Signed)
 Pt arrives via [facility] from home for joint pain he believes is r/t gout. Pt reports swelling to bilateral feet and ankles, some joints in his hands. Pt also states that he has developed R sided lumbar/thoracic back pain. Pt reports pain has caused him to become more immobile in the last few days.

## 2024-03-24 ENCOUNTER — Inpatient Hospital Stay (HOSPITAL_COMMUNITY)

## 2024-03-24 DIAGNOSIS — L03116 Cellulitis of left lower limb: Secondary | ICD-10-CM | POA: Diagnosis not present

## 2024-03-24 DIAGNOSIS — M7989 Other specified soft tissue disorders: Secondary | ICD-10-CM | POA: Diagnosis not present

## 2024-03-24 LAB — CBC
HCT: 31.9 % — ABNORMAL LOW (ref 39.0–52.0)
Hemoglobin: 10.3 g/dL — ABNORMAL LOW (ref 13.0–17.0)
MCH: 28 pg (ref 26.0–34.0)
MCHC: 32.3 g/dL (ref 30.0–36.0)
MCV: 86.7 fL (ref 80.0–100.0)
Platelets: 434 K/uL — ABNORMAL HIGH (ref 150–400)
RBC: 3.68 MIL/uL — ABNORMAL LOW (ref 4.22–5.81)
RDW: 17.8 % — ABNORMAL HIGH (ref 11.5–15.5)
WBC: 12.8 K/uL — ABNORMAL HIGH (ref 4.0–10.5)
nRBC: 0 % (ref 0.0–0.2)

## 2024-03-24 LAB — GLUCOSE, CAPILLARY
Glucose-Capillary: 85 mg/dL (ref 70–99)
Glucose-Capillary: 91 mg/dL (ref 70–99)
Glucose-Capillary: 121 mg/dL — ABNORMAL HIGH (ref 70–99)
Glucose-Capillary: 96 mg/dL (ref 70–99)

## 2024-03-24 LAB — HEMOGLOBIN A1C
Hgb A1c MFr Bld: 5.6 % (ref 4.8–5.6)
Mean Plasma Glucose: 114.02 mg/dL

## 2024-03-24 LAB — COMPREHENSIVE METABOLIC PANEL WITH GFR
ALT: 24 U/L (ref 0–44)
AST: 31 U/L (ref 15–41)
Albumin: 3 g/dL — ABNORMAL LOW (ref 3.5–5.0)
Alkaline Phosphatase: 108 U/L (ref 38–126)
Anion gap: 13 (ref 5–15)
BUN: 9 mg/dL (ref 8–23)
CO2: 22 mmol/L (ref 22–32)
Calcium: 9.2 mg/dL (ref 8.9–10.3)
Chloride: 104 mmol/L (ref 98–111)
Creatinine, Ser: 0.83 mg/dL (ref 0.61–1.24)
GFR, Estimated: >60 mL/min (ref >60)
Glucose, Bld: 87 mg/dL (ref 70–99)
Potassium: 3.6 mmol/L (ref 3.5–5.1)
Sodium: 139 mmol/L (ref 135–145)
Total Bilirubin: 0.9 mg/dL (ref 0.0–1.2)
Total Protein: 6.5 g/dL (ref 6.5–8.1)

## 2024-03-24 LAB — C-REACTIVE PROTEIN: CRP: 25.4 mg/dL — ABNORMAL HIGH (ref <1.0)

## 2024-03-24 LAB — SEDIMENTATION RATE: Sed Rate: 82 mm/h — ABNORMAL HIGH (ref 0–16)

## 2024-03-24 LAB — HIV ANTIBODY (ROUTINE TESTING W REFLEX): HIV Screen 4th Generation wRfx: NONREACTIVE

## 2024-03-24 LAB — MRSA NEXT GEN BY PCR, NASAL: MRSA by PCR Next Gen: NOT DETECTED

## 2024-03-24 MED ORDER — ENSURE PLUS HIGH PROTEIN PO LIQD
237.0000 mL | Freq: Two times a day (BID) | ORAL | Status: DC
  Administered 2024-03-25 – 2024-03-30 (×6): 237 mL via ORAL

## 2024-03-24 NOTE — Hospital Course (Addendum)
 Phillip Turner is a 63 y.o. male with PMH of alcohol  abuse in remission, anxiety, arthritis, depression, type 2 diabetes, hyperlipidemia, hypertension, hypothyroidism, rheumatic fever as a teenager, sickle cell trait, history of CVA with mild residual right-sided weakness and occasional speech disturbance, history of cocaine  abuse in remission who presented to the ED with left foot pain and edema that he states is similar to his prior acute gout episodes for the past 7 days has not responded to his colchicine  use. In ED: Initial vital signs were temperature 98.7 F, pulse 73, respirations 16, BP 149/97 mmHg and O2 sat 94% on room air.  Lab work: CBC showed white count of 14.5, hemoglobin 11.7 g/dL and platelets 572.  BMP/chemistry showed a CO2 of 20 mmol/L with a normal anion gap, glucose 109 and uric acid 6.7 mg/dL.  The rest of the electrolytes and renal function were normal. left foot x-ray>>no fracture or dislocation.  Diffuse soft tissue swelling and subcutaneous edema that may represent cellulitis. CT chest without>>no acute cardiopulmonary disease. 2 adjacent 2 mm nodules over the right upper lobe.  No follow-up negative if the patient is low risk ((and has no known more suspected primary neoplasm).  If high risk noncontrast chest CT in 12 months.Moderate cholelithiasis.1.7 cm left upper pole kidney lesion that is likely a cyst. Managed w/ IV antibiotics which was adjusted to ceftriaxone  and vancomycin > no significant improvement Seen by orthopedics subsequently abdomen MRI left foot and ankle-see report patient is versus erosive changes tendinitis calcification soft tissue edema and other findings of gout  Subjective: Seen and examined Alert awake oriented resting comfortably still having painful swelling and difficulty ambulation on the bilateral feet and ankle Feels swelling is going down as he is keeping it up. Overnight afebrile BP stable on room air Labs with hypokalemia 2.8 CBC with  thrombocytosis and anemia mild leukocytosis  Assessment and plan:  Pain swelling on b/l ankles and foot x 2 wks and worsening Suspected cellulitis of left foot> right Acute on chronic Gout on bilateral ankle/foot- PTA on chronic colchicine : Left foot/ankle pain and swelling worse than the right and progressively worsening, despite taking home colchicine  bid Admitted with possible cellulitis-Managed w/ IV antibiotics which was adjusted to ceftriaxone  and vancomycin > no significant improvement Blood cx NGTD from 11/19. Duplex negative for DVT,ESR CRP up, CT finding concerning for tendinitis calcification-changes of chronic gout. Dr Barton consulted s/p MRI left foot and ankle-see report patient is versus erosive changes tendinitis calcification soft tissue edema and other findings of gout.  Start Solu-Medrol  IV and wean quickly based upon how blood sugar dose. Continue multimodal pain management PT OT He had similar episode 10 yrs ago at home and was bed bound briefly, unable ot bear weight- ptot eval and pain management Dc vancomycin , keep rocephin  one more days. He states allopurinol  makes his leg swell.  Chronic back pain-Lumbar stenosis without neurogenic claudication Depression: He is on 3 monthly steroid injection on bacl cont pain management, antispasmodic-added. His mood is stable continue trazodone  100 mg p.o. at bedtime.   Type 2 diabetes mellitus: A1c 5.6. Well-controlled-to lower side, watch closely while starting Steroid.Continue sliding scale holding home dulaglutide  Recent Labs  Lab 03/24/24 0315 03/24/24 0740 03/26/24 0759 03/26/24 1124 03/26/24 1721 03/26/24 2137 03/27/24 0737  GLUCAP  --    < > 88 110* 130* 101* 80  HGBA1C 5.6  --   --   --   --   --   --    < > =  values in this interval not displayed.      Hypokalemia: Replace  Hyperlipidemia Cont atorvastatin   Hypothyroidism Continue home levothyroxine  137 mcg p.o. daily.  HTN Stable. continue  metoprolol  and hydralazine  prn.   Normocytic anemia Monitor hematocrit and hemoglobin.   Thrombocytosis In the setting of anemia and acute infection.   Mild protein malnutrition In the setting of acute infection.  Augmentin diet  2 mm nodules over the right upper lobe.: No follow-up negative if the patient is low risk ((and has no known more suspected primary neoplasm). If high risk noncontrast chest CT in 12 months.-Patient needs to be instructed before discharge   Moderate cholelithiasis: incidentally noticed   1.7 cm left upper pole kidney lesion that is likely a cyst: Ultrasound imaging recommended for further evaluation once stable  Class II Obesity w/ Body mass index is 36.22 kg/m.: Will benefit with PCP follow-up, weight loss,healthy lifestyle and outpatient sleep eval if not done.  Mobility: PT Orders: Active PT Follow up Rec:     DVT prophylaxis: Lovenox  Code Status:   Code Status: Full Code Family Communication: plan of care discussed with patien at bedside. Patient status is: Remains hospitalized because of severity of illness Level of care: Med-Surg   Dispo: The patient is from: HOME            Anticipated disposition: TBD Objective: Vitals last 24 hrs: Vitals:   03/26/24 2135 03/26/24 2223 03/27/24 0537 03/27/24 0920  BP: (!) 149/77 (!) 149/77 (!) 145/87 (!) 150/85  Pulse: 75  70 70  Resp: 18  17   Temp: 98.9 F (37.2 C)  98.3 F (36.8 C)   TempSrc: Oral  Oral   SpO2: 98%  97%   Weight:      Height:       Physical Examination: General exam: AAOX3 HEENT:Oral mucosa moist, Ear/Nose WNL grossly. Respiratory system: cta b/l. Cardiovascular system: S1 & S2 +, No JVD. Gastrointestinal system: Abdomen soft,NT,ND, BS+. Nervous System: Alert, awake, moving all extremities,and following commands. Extremities:  b/l foot/ankle tender swollen more on the left Skin: Warm, no rashes. MSK: Normal muscle bulk,tone, power.   Medications reviewed:  Scheduled  Meds:  aspirin  EC  325 mg Oral Daily   atorvastatin   10 mg Oral QHS   colchicine   0.6 mg Oral BID   empagliflozin   25 mg Oral Daily   enoxaparin  (LOVENOX ) injection  60 mg Subcutaneous Q24H   feeding supplement  237 mL Oral BID BM   folic acid   1 mg Oral Daily   insulin  aspart  0-6 Units Subcutaneous TID WC   levothyroxine   137 mcg Oral QAC breakfast   methylPREDNISolone  (SOLU-MEDROL ) injection  60 mg Intravenous Q12H   metoprolol  succinate  25 mg Oral BID   ondansetron   8 mg Oral Once   potassium chloride   40 mEq Oral Q3H   traZODone   200 mg Oral QHS   Continuous Infusions:  cefTRIAXone  (ROCEPHIN )  IV     potassium chloride  10 mEq (03/27/24 1057)   Diet: Diet Order             Diet heart healthy/carb modified Room service appropriate? Yes; Fluid consistency: Thin  Diet effective now

## 2024-03-24 NOTE — Progress Notes (Signed)
 PROGRESS NOTE Phillip Turner  FMW:[id number] DOB: 1960/12/11 DOA: 03/23/2024 PCP: Phillip Atlas, MD  Brief Narrative/Hospital Course: Phillip Turner is a 63 y.o. male with PMH of alcohol  abuse in remission, anxiety, arthritis, depression, type 2 diabetes, hyperlipidemia, hypertension, hypothyroidism, rheumatic fever as a teenager, sickle cell trait, history of CVA with mild residual right-sided weakness and occasional speech disturbance, history of cocaine  abuse in remission who presented to the ED with left foot pain and edema that he states is similar to his prior acute gout episodes for the past 7 days has not responded to his colchicine  use. In ED: Initial vital signs were temperature 98.7 F, pulse 73, respirations 16, BP 149/97 mmHg and O2 sat 94% on room air.  Lab work: CBC showed white count of 14.5, hemoglobin 11.7 g/dL and platelets 572.  BMP/chemistry showed a CO2 of 20 mmol/L with a normal anion gap, glucose 109 and uric acid 6.7 mg/dL.  The rest of the electrolytes and renal function were normal. left foot x-ray>>no fracture or dislocation.  Diffuse soft tissue swelling and subcutaneous edema that may represent cellulitis. CT chest without>>no acute cardiopulmonary disease. 2 adjacent 2 mm nodules over the right upper lobe.  No follow-up negative if the patient is low risk ((and has no known more suspected primary neoplasm).  If high risk noncontrast chest CT in 12 months.Moderate cholelithiasis.1.7 cm left upper pole kidney lesion that is likely a cyst.  Ultrasound imaging recommended for further evaluation.  Subjective: Seen and examined today Complains of swelling and pain on both ankles worse on the left Overnight  RA, afebrile, VSS, Labs wbc improving albumin  3    Assessment and plan:  Cellulitis of left foot Gout on chronic colchicine : Patient with left foot pain worse than the right and edema, at home taking colchicine  for acute gout without improvement, admitting with  leukocytosis erythema swelling and treating for cellulitis. Follow-up duplex, continue ceftriaxone  for IV, check MRSA swab, including ESR CRP, trend cbc, also check blood culture. He may need steroid  Chronic back pain on 3 mo steroid injection Continue pain management  Depression Mood stable continue trazodone  100 mg p.o. at bedtime.   Type 2 diabetes mellitus: Holding home dulaglutide , could not carb controlled diet SSI Recent Labs  Lab 03/23/24 1655 03/23/24 2109 03/24/24 0740  GLUCAP 95 93 85     Hyperlipidemia Cont atorvastatin   Hypothyroidism Continue levothyroxine  137 mcg p.o. daily.  HTN Stable,continue metoprolol  succinate 25 mg p.o. twice daily.   Pulmonary nodules Follow-up imaging in 1 year. Follow-up with PCP.   Normocytic anemia Monitor hematocrit and hemoglobin.   Thrombocytosis In the setting of anemia and acute infection.   Lumbar stenosis without neurogenic claudication Continue analgesics as needed.   Mild protein malnutrition In the setting of acute infection.  Augmentin diet  Class II Obesity w/ Body mass index is 36.22 kg/m.: Will benefit with PCP follow-up, weight loss,healthy lifestyle and outpatient sleep eval if not done.  DVT prophylaxis: Lovenox  Code Status:   Code Status: Full Code Family Communication: plan of care discussed with patien at bedside. Patient status is: Remains hospitalized because of severity of illness Level of care: Med-Surg   Dispo: The patient is from: HOME            Anticipated disposition: TBD Objective: Vitals last 24 hrs: Vitals:   03/23/24 2107 03/24/24 0158 03/24/24 0554 03/24/24 0802  BP: (!) 154/82 (!) 145/70 (!) 154/78 134/78  Pulse: 73 75 77 75  Resp: 16  16 18 14   Temp: 98.7 F (37.1 C) 98.7 F (37.1 C) 98.2 F (36.8 C) 98.3 F (36.8 C)  TempSrc: Oral Oral Oral Oral  SpO2: 97% 97% 97% 100%  Weight:      Height:        Physical Examination: General exam: alert awake, oriented, older  than stated age HEENT:Oral mucosa moist, Ear/Nose WNL grossly Respiratory system: Bilaterally clear BS,no use of accessory muscle Cardiovascular system: S1 & S2 +, No JVD. Gastrointestinal system: Abdomen soft,NT,ND, BS+ Nervous System: Alert, awake, moving all extremities,and following commands. Extremities: extremities warm, leg edema + on Lt anke-tenderness swelling more on the left ankle than the right Skin: Warm, no rashes MSK: Normal muscle bulk,tone, power   Medications reviewed:  Scheduled Meds:  aspirin  EC  325 mg Oral Daily   atorvastatin   10 mg Oral QHS   colchicine   0.6 mg Oral BID   empagliflozin   25 mg Oral Daily   enoxaparin  (LOVENOX ) injection  60 mg Subcutaneous Q24H   folic acid   1 mg Oral Daily   insulin  aspart  0-20 Units Subcutaneous TID WC   levothyroxine   137 mcg Oral QAC breakfast   metoprolol  succinate  25 mg Oral BID   ondansetron   8 mg Oral Once   traZODone   200 mg Oral QHS   Continuous Infusions:  cefTRIAXone  (ROCEPHIN )  IV     Diet: Diet Order             Diet heart healthy/carb modified Room service appropriate? Yes; Fluid consistency: Thin  Diet effective now                    Data Reviewed: I have personally reviewed following labs and imaging studies ( see epic result tab) CBC: Recent Labs  Lab 03/23/24 1046 03/24/24 0315  WBC 14.5* 12.8*  HGB 11.7* 10.3*  HCT 35.2* 31.9*  MCV 85.4 86.7  PLT 427* 434*   CMP: Recent Labs  Lab 03/23/24 1046 03/24/24 0315  NA 140 139  K 3.7 3.6  CL 105 104  CO2 20* 22  GLUCOSE 109* 87  BUN 8 9  CREATININE 0.96 0.83  CALCIUM  9.1 9.2   GFR: Estimated Creatinine Clearance: 115.4 mL/min (by C-G formula based on SCr of 0.83 mg/dL). Recent Labs  Lab 03/23/24 1519 03/24/24 0315  AST 40 31  ALT 28 24  ALKPHOS 123 108  BILITOT 1.1 0.9  PROT 7.2 6.5  ALBUMIN  3.2* 3.0*    Recent Labs  Lab 03/23/24 1519  LIPASE 25   No results for input(s): AMMONIA in the last 168 hours. Coagulation  Profile: No results for input(s): INR, PROTIME in the last 168 hours. Unresulted Labs (From admission, onward)     Start     Ordered   03/25/24 0500  Uric acid  Tomorrow morning,   R        03/24/24 1019   03/24/24 1019  MRSA Next Gen by PCR, Nasal  Once,   R        03/24/24 1018   03/24/24 0500  Hemoglobin A1c  Tomorrow morning,   R       Comments: To assess prior glycemic control    03/23/24 1625           Antimicrobials/Microbiology: Anti-infectives (From admission, onward)    Start     Dose/Rate Route Frequency Ordered Stop   03/24/24 1300  cefTRIAXone  (ROCEPHIN ) 2 g in sodium chloride  0.9 % 100 mL IVPB  2 g 200 mL/hr over 30 Minutes Intravenous Every 24 hours 03/23/24 1431     03/23/24 1300  cefTRIAXone (ROCEPHIN) 2 g in sodium chloride  0.9 % 100 mL IVPB        2 g 200 mL/hr over 30 Minutes Intravenous  Once 03/23/24 1250 03/23/24 1434         Component Value Date/Time   SDES URINE, CLEAN CATCH 03/14/2016 0950   SPECREQUEST NONE 03/14/2016 0950   CULT <10,000 COLONIES/mL INSIGNIFICANT GROWTH (A) 03/14/2016 0950   REPTSTATUS 03/15/2016 FINAL 03/14/2016 0950    Procedures:    Mennie LAMY, MD [facility] 03/24/2024, 10:45 AM

## 2024-03-24 NOTE — Plan of Care (Signed)

## 2024-03-24 NOTE — Progress Notes (Signed)
 Left lower extremity venous duplex has been completed. Preliminary results can be found in CV Proc through chart review.   03/24/24 10:01 AM Cathlyn Collet RVT

## 2024-03-25 ENCOUNTER — Inpatient Hospital Stay (HOSPITAL_COMMUNITY)

## 2024-03-25 DIAGNOSIS — L03116 Cellulitis of left lower limb: Secondary | ICD-10-CM | POA: Diagnosis not present

## 2024-03-25 LAB — PRO BRAIN NATRIURETIC PEPTIDE: Pro Brain Natriuretic Peptide: 397 pg/mL — ABNORMAL HIGH (ref <300.0)

## 2024-03-25 LAB — URIC ACID: Uric Acid, Serum: 6.8 mg/dL (ref 3.7–8.6)

## 2024-03-25 LAB — GLUCOSE, CAPILLARY
Glucose-Capillary: 100 mg/dL — ABNORMAL HIGH (ref 70–99)
Glucose-Capillary: 141 mg/dL — ABNORMAL HIGH (ref 70–99)
Glucose-Capillary: 91 mg/dL (ref 70–99)
Glucose-Capillary: 120 mg/dL — ABNORMAL HIGH (ref 70–99)

## 2024-03-25 MED ORDER — VANCOMYCIN HCL 2000 MG/400ML IV SOLN
2000.0000 mg | Freq: Once | INTRAVENOUS | Status: AC
  Administered 2024-03-25: 2000 mg via INTRAVENOUS
  Filled 2024-03-25: qty 400

## 2024-03-25 MED ORDER — METHOCARBAMOL 1000 MG/10ML IJ SOLN
500.0000 mg | Freq: Four times a day (QID) | INTRAMUSCULAR | Status: DC | PRN
  Administered 2024-03-25 – 2024-03-26 (×3): 500 mg via INTRAVENOUS
  Filled 2024-03-25 (×3): qty 10

## 2024-03-25 MED ORDER — IOHEXOL 300 MG/ML  SOLN
100.0000 mL | Freq: Once | INTRAMUSCULAR | Status: AC | PRN
Start: 2024-03-25 — End: 2024-03-25
  Administered 2024-03-25: 100 mL via INTRAVENOUS

## 2024-03-25 MED ORDER — VANCOMYCIN HCL 1250 MG/250ML IV SOLN
1250.0000 mg | Freq: Two times a day (BID) | INTRAVENOUS | Status: DC
  Administered 2024-03-26 – 2024-03-27 (×3): 1250 mg via INTRAVENOUS
  Filled 2024-03-25 (×4): qty 250

## 2024-03-25 MED ORDER — HYDRALAZINE HCL 25 MG PO TABS
25.0000 mg | ORAL_TABLET | Freq: Three times a day (TID) | ORAL | Status: DC | PRN
  Administered 2024-03-25: 25 mg via ORAL
  Filled 2024-03-25: qty 1

## 2024-03-25 MED ORDER — SODIUM CHLORIDE 0.9 % IV SOLN: INTRAVENOUS | Status: AC

## 2024-03-25 NOTE — Progress Notes (Signed)
 Pharmacy Antibiotic Note  Phillip Turner is a 63 y.o. male admitted on 03/23/2024 with cellulitis.  Pharmacy has been consulted for vancomycin dosing.  Pt is already of ceftriaxone  Plan: Vancomycin 2000 mg IV 1 then vancomycin 1250 mg IV q12h ( AUC 529, Scr 0.83 mg/dl, Vd 0.5, wt 885.4 kg)  Ceftriaxone 2 gr IV q24h   Height: 5' 10 (177.8 cm) Weight: 114.5 kg (252 lb 6.8 oz) IBW/kg (Calculated) : 73  Temp (24hrs), Avg:98.6 F (37 C), Min:97.4 F (36.3 C), Max:100.1 F (37.8 C)  Recent Labs  Lab 03/23/24 1046 03/24/24 0315  WBC 14.5* 12.8*  CREATININE 0.96 0.83    Estimated Creatinine Clearance: 115.4 mL/min (by C-G formula based on SCr of 0.83 mg/dL).    Allergies  Allergen Reactions   Clonidine  Derivatives Other (See Comments)    Slow heartbeat   Shellfish Allergy Anaphylaxis, Nausea And Vomiting and Swelling   Naproxen  Swelling   Allopurinol  Swelling and Other (See Comments)    Made the legs swell   Gabapentin  Swelling and Other (See Comments)    Made the legs swell   Indomethacin  Swelling   Lyrica  [Pregabalin ] Swelling and Other (See Comments)    Made the legs swell   Invokana  [Canagliflozin ] Swelling and Other (See Comments)    Headaches and made the legs swell   Metformin  And Related Nausea And Vomiting and Other (See Comments)    Patient states it makes him sick   Other Itching, Swelling and Rash    Stainless steel.     Not pure metals; cheap metals    Antimicrobials this admission: 11/18 ceftriaxone >>  11/20 vancomycin >>   Dose adjustments this admission:   Microbiology results: 11/19 BCx: NGTD 11/19 MRSA PCR: negative    Dolphus Roller, PharmD, BCPS 03/25/2024 6:20 PM

## 2024-03-25 NOTE — Progress Notes (Signed)
 PROGRESS NOTE Phillip Turner  FMW:[id number] DOB: [birth year] DOA: 03/23/2024 PCP: [name], MD  Brief Narrative/Hospital Course: Phillip Turner is a 63 y.o. male with PMH of alcohol  abuse in remission, anxiety, arthritis, depression, type 2 diabetes, hyperlipidemia, hypertension, hypothyroidism, rheumatic fever as a teenager, sickle cell trait, history of CVA with mild residual right-sided weakness and occasional speech disturbance, history of cocaine  abuse in remission who presented to the ED with left foot pain and edema that he states is similar to his prior acute gout episodes for the past 7 days has not responded to his colchicine  use. In ED: Initial vital signs were temperature 98.7 F, pulse 73, respirations 16, BP 149/97 mmHg and O2 sat 94% on room air.  Lab work: CBC showed white count of 14.5, hemoglobin 11.7 g/dL and platelets 572.  BMP/chemistry showed a CO2 of 20 mmol/L with a normal anion gap, glucose 109 and uric acid 6.7 mg/dL.  The rest of the electrolytes and renal function were normal. left foot x-ray>>no fracture or dislocation.  Diffuse soft tissue swelling and subcutaneous edema that may represent cellulitis. CT chest without>>no acute cardiopulmonary disease. 2 adjacent 2 mm nodules over the right upper lobe.  No follow-up negative if the patient is low risk ((and has no known more suspected primary neoplasm).  If high risk noncontrast chest CT in 12 months.Moderate cholelithiasis.1.7 cm left upper pole kidney lesion that is likely a cyst.  Ultrasound imaging recommended for further evaluation.  Subjective: Seen and examined today States pain on bottom of feet same swelling coming down, unable to put pressure on feets. Has chronic back pain and spasm with movements Overnight  RA, afebrile, VSS  Assessment and plan:  Pain swelling on b/l ankles x 2 wks and worsening Suspected cellulitis of left foot> right Gout on chronic colchicine : Left foot pain worse than the  right and edema, at home taking colchicine  for acute gout without improvement, admitting with leukocytosis erythema swelling and treating for cellulitis but does have chronic pain and swelling on akles, has back issues-had surgery for sciatiace Duplex is negative. MRSA swab neg, including ESR CRP up  wbc down trending, blood culture sent 11/19 On ceftriaxone but not much improvement- will d/w pharmAcy to see if we need to change antibiotics- get CT lt ankle w/ iv - and add gentel ivf for contrast,. Check bnp, procal. He had similar episode 10 yrs ago at home and was bed bound briefly I have reached out to Ortho and discussed w/ Dr Channing for consult  Chronic back pain on 3 monthly steroid injection Continue pain management, antispasmodic-added.  Depression Mood stable continue trazodone  100 mg p.o. at bedtime.   Type 2 diabetes mellitus: Stable, Holding home dulaglutide , could not carb controlled diet SSI Recent Labs  Lab 03/24/24 0315 03/24/24 0740 03/24/24 1125 03/24/24 1623 03/24/24 2104 03/25/24 0726 03/25/24 1120  GLUCAP  --    < > 91 121* 96 100* 141*  HGBA1C 5.6  --   --   --   --   --   --    < > = values in this interval not displayed.     Hyperlipidemia Cont atorvastatin   Hypothyroidism Continue levothyroxine  137 mcg p.o. daily.  HTN Stable,continue metoprolol    Pulmonary nodules Follow-up imaging in 1 year. Follow-up with PCP.   Normocytic anemia Monitor hematocrit and hemoglobin.   Thrombocytosis In the setting of anemia and acute infection.   Lumbar stenosis without neurogenic claudication Continue analgesics as needed.  Mild protein malnutrition In the setting of acute infection.  Augmentin diet  Class II Obesity w/ Body mass index is 36.22 kg/m.: Will benefit with PCP follow-up, weight loss,healthy lifestyle and outpatient sleep eval if not done.  DVT prophylaxis: Lovenox  Code Status:   Code Status: Full Code Family Communication: plan of care  discussed with patien at bedside. Patient status is: Remains hospitalized because of severity of illness Level of care: Med-Surg   Dispo: The patient is from: HOME            Anticipated disposition: TBD Objective: Vitals last 24 hrs: Vitals:   03/24/24 1316 03/24/24 2102 03/25/24 0603 03/25/24 0800  BP: (!) 161/89 136/74 129/70 (!) 148/87  Pulse: 76 76 72 79  Resp: 17 18 16 19   Temp: 98.9 F (37.2 C) 100.1 F (37.8 C) 98.7 F (37.1 C) 98.8 F (37.1 C)  TempSrc: Oral Oral Oral Oral  SpO2: 99% 94% 97%   Weight:      Height:       Physical Examination: General exam: AAOX3, pleasant. HEENT:Oral mucosa moist, Ear/Nose WNL grossly. Respiratory system: cta b/l. Cardiovascular system: S1 & S2 +, No JVD. Gastrointestinal system: Abdomen soft,NT,ND, BS+. Nervous System: Alert, awake, moving all extremities,and following commands. Extremities: extremities warm, leg edema and tenderness ++ on b/l ankles and feet  but worse on left. Skin: Warm, no rashes. MSK: Normal muscle bulk,tone, power.   Medications reviewed:  Scheduled Meds:  aspirin  EC  325 mg Oral Daily   atorvastatin   10 mg Oral QHS   colchicine   0.6 mg Oral BID   empagliflozin  25 mg Oral Daily   enoxaparin  (LOVENOX ) injection  60 mg Subcutaneous Q24H   feeding supplement  237 mL Oral BID BM   folic acid   1 mg Oral Daily   insulin  aspart  0-20 Units Subcutaneous TID WC   levothyroxine   137 mcg Oral QAC breakfast   metoprolol  succinate  25 mg Oral BID   ondansetron   8 mg Oral Once   traZODone   200 mg Oral QHS   Continuous Infusions:  sodium chloride  75 mL/hr at 03/25/24 1054   cefTRIAXone (ROCEPHIN)  IV 2 g (03/24/24 1203)   Diet: Diet Order             Diet heart healthy/carb modified Room service appropriate? Yes; Fluid consistency: Thin  Diet effective now                    Data Reviewed: I have personally reviewed following labs and imaging studies ( see epic result tab) CBC: Recent Labs  Lab  03/23/24 1046 03/24/24 0315  WBC 14.5* 12.8*  HGB 11.7* 10.3*  HCT 35.2* 31.9*  MCV 85.4 86.7  PLT 427* 434*   CMP: Recent Labs  Lab 03/23/24 1046 03/24/24 0315  NA 140 139  K 3.7 3.6  CL 105 104  CO2 20* 22  GLUCOSE 109* 87  BUN 8 9  CREATININE 0.96 0.83  CALCIUM  9.1 9.2   GFR: Estimated Creatinine Clearance: 115.4 mL/min (by C-G formula based on SCr of 0.83 mg/dL). Recent Labs  Lab 03/23/24 1519 03/24/24 0315  AST 40 31  ALT 28 24  ALKPHOS 123 108  BILITOT 1.1 0.9  PROT 7.2 6.5  ALBUMIN  3.2* 3.0*    Recent Labs  Lab 03/23/24 1519  LIPASE 25   No results for input(s): AMMONIA in the last 168 hours. Coagulation Profile: No results for input(s): INR, PROTIME in the last  168 hours. Unresulted Labs (From admission, onward)     Start     Ordered   03/26/24 0500  Basic metabolic panel with GFR  Daily,   R      03/25/24 0935   03/26/24 0500  CBC  Daily,   R      03/25/24 0935   03/25/24 0934  Pro Brain natriuretic peptide  Once,   R        03/25/24 0934           Antimicrobials/Microbiology: Anti-infectives (From admission, onward)    Start     Dose/Rate Route Frequency Ordered Stop   03/24/24 1300  cefTRIAXone (ROCEPHIN) 2 g in sodium chloride  0.9 % 100 mL IVPB        2 g 200 mL/hr over 30 Minutes Intravenous Every 24 hours 03/23/24 1431     03/23/24 1300  cefTRIAXone (ROCEPHIN) 2 g in sodium chloride  0.9 % 100 mL IVPB        2 g 200 mL/hr over 30 Minutes Intravenous  Once 03/23/24 1250 03/23/24 1434         Component Value Date/Time   SDES  03/24/2024 1206    BLOOD BLOOD LEFT ARM Performed at [facility], [address], [place], [place] [address]    SDES  03/24/2024 1206    BLOOD BLOOD LEFT ARM Performed at [facility], [address] [place], [place] [address]    SPECREQUEST  03/24/2024 1206    BOTTLES DRAWN AEROBIC AND ANAEROBIC Blood Culture adequate volume Performed at [facility], [address], [place], [place] [address]    SPECREQUEST  03/24/2024 1206    BOTTLES DRAWN AEROBIC AND ANAEROBIC Blood Culture adequate volume Performed at [facility], [address] [place], [place] [address]    CULT  03/24/2024 1206    NO GROWTH < 24 HOURS Performed at [facility], [address], [place], [place] [address]    CULT  03/24/2024 1206    NO GROWTH < 24 HOURS Performed at [facility], [address] [place], [place] [address]    REPTSTATUS PENDING 03/24/2024 1206   REPTSTATUS PENDING 03/24/2024 1206    Procedures:    Mennie LAMY, MD [facility] 03/25/2024, 11:23 AM

## 2024-03-25 NOTE — Plan of Care (Signed)

## 2024-03-25 NOTE — Plan of Care (Signed)

## 2024-03-26 ENCOUNTER — Inpatient Hospital Stay (HOSPITAL_COMMUNITY)

## 2024-03-26 DIAGNOSIS — L03116 Cellulitis of left lower limb: Secondary | ICD-10-CM | POA: Diagnosis not present

## 2024-03-26 LAB — CBC
HCT: 30.7 % — ABNORMAL LOW (ref 39.0–52.0)
Hemoglobin: 10.1 g/dL — ABNORMAL LOW (ref 13.0–17.0)
MCH: 28.1 pg (ref 26.0–34.0)
MCHC: 32.9 g/dL (ref 30.0–36.0)
MCV: 85.3 fL (ref 80.0–100.0)
Platelets: 541 K/uL — ABNORMAL HIGH (ref 150–400)
RBC: 3.6 MIL/uL — ABNORMAL LOW (ref 4.22–5.81)
RDW: 17.5 % — ABNORMAL HIGH (ref 11.5–15.5)
WBC: 12.3 K/uL — ABNORMAL HIGH (ref 4.0–10.5)
nRBC: 0 % (ref 0.0–0.2)

## 2024-03-26 LAB — BASIC METABOLIC PANEL WITH GFR
Anion gap: 14 (ref 5–15)
BUN: 7 mg/dL — ABNORMAL LOW (ref 8–23)
CO2: 22 mmol/L (ref 22–32)
Calcium: 9.4 mg/dL (ref 8.9–10.3)
Chloride: 105 mmol/L (ref 98–111)
Creatinine, Ser: 0.83 mg/dL (ref 0.61–1.24)
GFR, Estimated: >60 mL/min (ref >60)
Glucose, Bld: 72 mg/dL (ref 70–99)
Potassium: 3.6 mmol/L (ref 3.5–5.1)
Sodium: 140 mmol/L (ref 135–145)

## 2024-03-26 LAB — GLUCOSE, CAPILLARY
Glucose-Capillary: 63 mg/dL — ABNORMAL LOW (ref 70–99)
Glucose-Capillary: 88 mg/dL (ref 70–99)
Glucose-Capillary: 110 mg/dL — ABNORMAL HIGH (ref 70–99)
Glucose-Capillary: 130 mg/dL — ABNORMAL HIGH (ref 70–99)
Glucose-Capillary: 101 mg/dL — ABNORMAL HIGH (ref 70–99)

## 2024-03-26 LAB — CK: Total CK: 43 U/L — ABNORMAL LOW (ref 49–397)

## 2024-03-26 MED ORDER — GADOBUTROL 1 MMOL/ML IV SOLN: 19.0000 mL | Freq: Once | INTRAVENOUS | Status: DC | PRN

## 2024-03-26 MED ORDER — INSULIN ASPART 100 UNIT/ML IJ SOLN
0.0000 [IU] | Freq: Three times a day (TID) | INTRAMUSCULAR | Status: DC
  Administered 2024-03-27 – 2024-03-29 (×5): 1 [IU] via SUBCUTANEOUS
  Administered 2024-03-30 (×2): 2 [IU] via SUBCUTANEOUS
  Filled 2024-03-26: qty 2
  Filled 2024-03-26 (×3): qty 1
  Filled 2024-03-26: qty 2
  Filled 2024-03-26 (×2): qty 1

## 2024-03-26 MED ORDER — GADOBUTROL 1 MMOL/ML IV SOLN
10.0000 mL | Freq: Once | INTRAVENOUS | Status: AC | PRN
  Administered 2024-03-26: 10 mL via INTRAVENOUS

## 2024-03-26 NOTE — Consult Note (Addendum)
 Patient ID: Phillip Turner MRN: [id number] DOB/AGE: 1960/08/02 63 y.o.  Admit date: 03/23/2024  Admission Diagnoses:  Principal Problem:   Cellulitis of left foot Active Problems:   Depression   Type 2 diabetes mellitus (HCC)   Hypothyroidism   HTN (hypertension)   Pulmonary nodules   Normocytic anemia   Thrombocytosis   Lumbar stenosis without neurogenic claudication   Class II obesity   Mild protein malnutrition   Hyperlipidemia   HPI: Ortho consult for left foot & ankle diffuse pain and swelling x 1 wk at least. Denies trauma. Denies fever/chills. Similar episode 10 years ago that self resolved. Known history of gout.  PMH notable for alcohol  abuse in remission, anxiety, arthritis, depression, type 2 diabetes, hyperlipidemia, hypertension, hypothyroidism, rheumatic fever as a teenager, sickle cell trait, history of CVA with mild residual right-sided weakness and occasional speech disturbance, history of cocaine  abuse in remission.  Denies numbness/tingling.  Past Medical History: Past Medical History:  Diagnosis Date   Anxiety    from past drug use not longer having anxiety   Arthritis    Depression    past depression relating to drug use    Diabetes mellitus    Gout    Hyperlipidemia    Hypertension    Hypothyroidism    Rheumatic fever    as a teenager   Sickle cell anemia (HCC)    trait carrier   Stroke [facility])    does not remember when, weakness on right side, some trouble speaking at times   Substance abuse (HCC)    former cocaine  user- not since March 2014    Surgical History: Past Surgical History:  Procedure Laterality Date   BACK SURGERY  2020   boil removed from tailbone      COLONOSCOPY     EYE SURGERY Right 2018   LUMBAR LAMINECTOMY/DECOMPRESSION MICRODISCECTOMY N/A 11/27/2018   Procedure: MICRODISCECTOMY REDO LEFT LUMBAR ONE- LUMBAR TWO, RIGHT LUMBAR THREE- LUMBAR FOUR;  Surgeon: Onetha Kuba, MD;  Location: [facility] OR;  Service: Neurosurgery;   Laterality: N/A;   TONSILLECTOMY     TOTAL HIP ARTHROPLASTY Left 11/14/2015   Procedure: LEFT TOTAL HIP ARTHROPLASTY ANTERIOR APPROACH;  Surgeon: Evalene JONETTA Chancy, MD;  Location: [facility] OR;  Service: Orthopedics;  Laterality: Left;   TOTAL HIP ARTHROPLASTY Right 03/26/2016   Procedure: RIGHT TOTAL HIP ARTHROPLASTY ANTERIOR APPROACH;  Surgeon: Evalene JONETTA Chancy, MD;  Location: [facility] OR;  Service: Orthopedics;  Laterality: Right;    Family History: Family History  Problem Relation Age of Onset   Coronary artery disease Mother    Diabetes type II Father    Hypertension Father    Colon cancer Neg Hx    Esophageal cancer Neg Hx    Rectal cancer Neg Hx    Stomach cancer Neg Hx     Social History: Social History   Socioeconomic History   Marital status: Married    Spouse name: Not on file   Number of children: Not on file   Years of education: Not on file   Highest education level: Not on file  Occupational History   Not on file  Tobacco Use   Smoking status: Former    Types: Cigarettes   Smokeless tobacco: Never  Substance and Sexual Activity   Alcohol  use: No   Drug use: No    Comment: Cocaine , crack former- since 2014   Sexual activity: Yes  Other Topics Concern   Not on file  Social History Narrative  Married   11th grade education   Not employed    Social Drivers of Health   Financial Resource Strain: Not on file  Food Insecurity: No Food Insecurity (03/23/2024)   Hunger Vital Sign    Worried About Running Out of Food in the Last Year: Never true    Ran Out of Food in the Last Year: Never true  Transportation Needs: No Transportation Needs (03/23/2024)   PRAPARE - Administrator, Civil Service (Medical): No    Lack of Transportation (Non-Medical): No  Physical Activity: Not on file  Stress: Not on file  Social Connections: Not on file  Intimate Partner Violence: Not At Risk (03/23/2024)   Humiliation, Afraid, Rape, and Kick questionnaire    Fear of  Current or Ex-Partner: No    Emotionally Abused: No    Physically Abused: No    Sexually Abused: No    Allergies: Clonidine  derivatives, Shellfish allergy, Naproxen , Allopurinol , Gabapentin , Indomethacin , Lyrica  [pregabalin ], Invokana  [canagliflozin ], Metformin  and related, and Other  Medications: I have reviewed the patient's current medications.  Vital Signs: Patient Vitals for the past 24 hrs:  BP Temp Temp src Pulse Resp SpO2  03/26/24 2223 (!) 149/77 -- -- -- -- --  03/26/24 2135 (!) 149/77 98.9 F (37.2 C) Oral 75 18 98 %  03/26/24 0804 131/76 98.8 F (37.1 C) Oral 80 18 98 %  03/26/24 0609 (!) 144/77 99.2 F (37.3 C) Oral 66 18 98 %    Radiology: MR FOOT LEFT W WO CONTRAST Result Date: 03/26/2024 CLINICAL DATA:  Left foot and ankle pain and swelling. History of gout. Cellulitis. EXAM: MRI OF THE LEFT FOREFOOT WITHOUT AND WITH CONTRAST; MRI OF THE LEFT ANKLE WITHOUT AND WITH CONTRAST TECHNIQUE: Multiplanar, multisequence MR imaging of the left ankle and foot was performed both before and after administration of intravenous contrast. CONTRAST:  10mL GADAVIST  GADOBUTROL  1 MMOL/ML IV SOLN COMPARISON:  CT left ankle dated 03/25/2024. FINDINGS: Bones/Joint/Cartilage No acute fracture or dislocation. Osseous erosions at the medial and lateral malleoli, subtalar joints, dorsal midfoot, Lisfranc joint, and juxta-articular base of the fourth metatarsal, with adjacent amorphous calcification along the level of the distal tibiofibular syndesmosis, structures of the lateral ligamentous complex, and deltoid ligament. There is associated patchy periarticular and juxta-articular edema and enhancement. Osteonecrosis within the cuboid with surrounding marrow edema. There is a 3.1 x 2.8 x 2.8 cm multiseptated cystic structure with heterogenous T2 signal, foci of low signal intensity, and a thin enhancing low signal intensity rim, which corresponds to the larger cluster of calcifications below the base  of the first metatarsal noted on the prior CT of the left ankle (series 14, image 17 and series 12, image 17). Periarticular soft tissue edema and enhancement at the midfoot. Suspected osteonecrosis within the fourth metatarsal shaft. Periarticular erosive changes of the first metatarsal head with corresponding amorphous calcifications and chondrocalcinosis along the ligamentous structures of the first MTP joint. No significant joint effusion. Ligaments Amorphous calcification along the level of the distal tibiofibular syndesmosis, structures of the lateral ligamentous complex, and deltoid ligament. Lisfranc ligament appears intact. Loss of fat signal within the sinus tarsi. Muscles and Tendons Tenosynovitis of the common peroneal tendon sheath extending from above the level of the lateral malleolus through the insertions distally. Mild tendinosis and tenosynovitis of the posterior tibial tendon. Tendinosis and tenosynovitis of the extensor digitorum tendon at the ankle. Achilles tendon and plantar fascia are intact. Soft tissue Circumferential subcutaneous edema extending from the ankle  through the dorsal forefoot. A 9 x 9 x 6 mm T2 hyperintense peripherally enhancing focus within the plantar subcutaneous tissues overlying the fifth metatarsal head (series 9, image 22 and series 7, image 27), could reflect a ganglion cyst, although, the sterility of this fluid remains indeterminate. IMPRESSION: 1. Osseous erosive changes with calcific tendinitis and amorphous calcifications involving the ankle, midfoot, and first metatarsal head, as described above. There is associated periarticular marrow and soft tissue edema and enhancement of the midfoot and ankle. A 3.1 x 2.8 x 2.8 cm multiseptated heterogenous cystic structure with foci of low signal intensity and a thin enhancing low signal intensity rim corresponds to the larger cluster of calcifications below the base of the first metatarsal noted on the prior CT of the  left ankle. These findings could reflect a combination of gout and/or tumoral calcinosis/calcific periarthritis. Superimposed acute infection can not be entirely excluded. 2. A 9 mm peripherally enhancing fluid intensity focus within the plantar subcutaneous tissues overlying the fifth metatarsal head could reflect a ganglion cyst, although, the sterility of this fluid remains indeterminate. 3. Tenosynovitis of the common peroneal tendon sheath extending from above the level of the lateral malleolus through the insertions distally. Mild tendinosis and tenosynovitis of the posterior tibial tendon. Tendinosis and tenosynovitis of the extensor digitorum tendon. 4. Amorphous calcification along the level of the distal tibiofibular syndesmosis, structures of the lateral ligamentous complex, and deltoid ligament. Lisfranc ligament appears intact. Loss of fat signal within the sinus tarsi. 5. Subcutaneous edema extending from the ankle through the dorsal foot. Electronically Signed   By: Harrietta Sherry M.D.   On: 03/26/2024 11:52   MR ANKLE LEFT W WO CONTRAST Result Date: 03/26/2024 CLINICAL DATA:  Left foot and ankle pain and swelling. History of gout. Cellulitis. EXAM: MRI OF THE LEFT FOREFOOT WITHOUT AND WITH CONTRAST; MRI OF THE LEFT ANKLE WITHOUT AND WITH CONTRAST TECHNIQUE: Multiplanar, multisequence MR imaging of the left ankle and foot was performed both before and after administration of intravenous contrast. CONTRAST:  10mL GADAVIST  GADOBUTROL  1 MMOL/ML IV SOLN COMPARISON:  CT left ankle dated 03/25/2024. FINDINGS: Bones/Joint/Cartilage No acute fracture or dislocation. Osseous erosions at the medial and lateral malleoli, subtalar joints, dorsal midfoot, Lisfranc joint, and juxta-articular base of the fourth metatarsal, with adjacent amorphous calcification along the level of the distal tibiofibular syndesmosis, structures of the lateral ligamentous complex, and deltoid ligament. There is associated patchy  periarticular and juxta-articular edema and enhancement. Osteonecrosis within the cuboid with surrounding marrow edema. There is a 3.1 x 2.8 x 2.8 cm multiseptated cystic structure with heterogenous T2 signal, foci of low signal intensity, and a thin enhancing low signal intensity rim, which corresponds to the larger cluster of calcifications below the base of the first metatarsal noted on the prior CT of the left ankle (series 14, image 17 and series 12, image 17). Periarticular soft tissue edema and enhancement at the midfoot. Suspected osteonecrosis within the fourth metatarsal shaft. Periarticular erosive changes of the first metatarsal head with corresponding amorphous calcifications and chondrocalcinosis along the ligamentous structures of the first MTP joint. No significant joint effusion. Ligaments Amorphous calcification along the level of the distal tibiofibular syndesmosis, structures of the lateral ligamentous complex, and deltoid ligament. Lisfranc ligament appears intact. Loss of fat signal within the sinus tarsi. Muscles and Tendons Tenosynovitis of the common peroneal tendon sheath extending from above the level of the lateral malleolus through the insertions distally. Mild tendinosis and tenosynovitis of the posterior tibial tendon.  Tendinosis and tenosynovitis of the extensor digitorum tendon at the ankle. Achilles tendon and plantar fascia are intact. Soft tissue Circumferential subcutaneous edema extending from the ankle through the dorsal forefoot. A 9 x 9 x 6 mm T2 hyperintense peripherally enhancing focus within the plantar subcutaneous tissues overlying the fifth metatarsal head (series 9, image 22 and series 7, image 27), could reflect a ganglion cyst, although, the sterility of this fluid remains indeterminate. IMPRESSION: 1. Osseous erosive changes with calcific tendinitis and amorphous calcifications involving the ankle, midfoot, and first metatarsal head, as described above. There is  associated periarticular marrow and soft tissue edema and enhancement of the midfoot and ankle. A 3.1 x 2.8 x 2.8 cm multiseptated heterogenous cystic structure with foci of low signal intensity and a thin enhancing low signal intensity rim corresponds to the larger cluster of calcifications below the base of the first metatarsal noted on the prior CT of the left ankle. These findings could reflect a combination of gout and/or tumoral calcinosis/calcific periarthritis. Superimposed acute infection can not be entirely excluded. 2. A 9 mm peripherally enhancing fluid intensity focus within the plantar subcutaneous tissues overlying the fifth metatarsal head could reflect a ganglion cyst, although, the sterility of this fluid remains indeterminate. 3. Tenosynovitis of the common peroneal tendon sheath extending from above the level of the lateral malleolus through the insertions distally. Mild tendinosis and tenosynovitis of the posterior tibial tendon. Tendinosis and tenosynovitis of the extensor digitorum tendon. 4. Amorphous calcification along the level of the distal tibiofibular syndesmosis, structures of the lateral ligamentous complex, and deltoid ligament. Lisfranc ligament appears intact. Loss of fat signal within the sinus tarsi. 5. Subcutaneous edema extending from the ankle through the dorsal foot. Electronically Signed   By: Harrietta Sherry M.D.   On: 03/26/2024 11:52   CT ANKLE LEFT W CONTRAST Result Date: 03/25/2024 EXAM: CT LEFT ANKLE, WITH IV CONTRAST 03/25/2024 10:33:54 AM TECHNIQUE: Axial images were acquired through the left ankle with IV contrast. Reformatted images were reviewed. Automated exposure control, iterative reconstruction, and/or weight based adjustment of the mA/kV was utilized to reduce the radiation dose to as low as reasonably achievable. Motion artifact is present, reducing diagnostic sensitivity and specificity. COMPARISON: None provided. CLINICAL HISTORY: Cellulitis.  FINDINGS: BONES: No acute fracture or focal osseous lesion. Adjacent erosions noted along the distal tibial rim, subtalar joints, dorsal midfoot, and Lisfranc joint with adjacent amorphous calcifications. JOINTS: No dislocation. The joint spaces are normal. Chondrocalcinosis with amorphous calcifications along the distal tibiofibular articulation, below the malleoli, and along structures of the lateral ligamentous complex, deltoid ligament, superior ligament, and ligamentous structures in the sinus tarsi. Amorphous calcifications and chondrocalcinosis along ligamentous structures of the 1st metatarsophalangeal joint noted. There is a larger cluster of calcifications below the base of the 1st metatarsal measuring 2.7 x 2.8 x 2.8 cm on image 124 series 8. SOFT TISSUES: Circumferential subcutaneous edema in the ankle tracking into the dorsum of the foot. Calcification along the margins of the peroneal longus and brevis tendons noted with common peroneal tenosynovitis posterior to the lateral malleolus with faint calcification along the common tendon sheath margin. Mild distal achilles tendinopathy with convex anterior margin. Faint extensor tendon calcifications especially along the tibialis anterior and extensor digitorum longus. Mild thickening of the medial band of the plantar fascia, plantar fasciitis not excluded. IMPRESSION: 1. Calcific tendinitis with amorphous calcifications involving multiple ligamentous structures and tendons, with associated erosions and amorphous periarticular calcifications, largest below the base of the 1st metatarsal.  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 Gout could be contributory. 2. Circumferential subcutaneous edema in the ankle tracking into the dorsum of the foot. 3. No fracture observed. Electronically signed by: Ryan Salvage MD 03/25/2024 10:51 AM EST RP Workstation: [id number]   VAS US  LOWER EXTREMITY VENOUS (DVT) Result Date:  03/24/2024  Lower Venous DVT Study Patient Name:  ISTVAN BEHAR  Date of Exam:   03/24/2024 Medical Rec #: [id number]          Accession #:    [id number] Date of Birth: March 16, 1961           Patient Gender: M Patient Age:   49 years Exam Location:  [facility] Procedure:      VAS US  LOWER EXTREMITY VENOUS (DVT) Referring Phys: DAVID ORTIZ --------------------------------------------------------------------------------  Indications: Swelling.  Risk Factors: None identified. Limitations: Poor ultrasound/tissue interface. Comparison Study: No prior studies. Performing Technologist: Cordella Collet RVT  Examination Guidelines: A complete evaluation includes B-mode imaging, spectral Doppler, color Doppler, and power Doppler as needed of all accessible portions of each vessel. Bilateral testing is considered an integral part of a complete examination. Limited examinations for reoccurring indications may be performed as noted. The reflux portion of the exam is performed with the patient in reverse Trendelenburg.  +-----+---------------+---------+-----------+----------+--------------+ RIGHTCompressibilityPhasicitySpontaneityPropertiesThrombus Aging +-----+---------------+---------+-----------+----------+--------------+ CFV  Full           Yes      Yes                                 +-----+---------------+---------+-----------+----------+--------------+   +---------+---------------+---------+-----------+----------+-------------------+ LEFT     CompressibilityPhasicitySpontaneityPropertiesThrombus Aging      +---------+---------------+---------+-----------+----------+-------------------+ CFV      Full           Yes      Yes                                      +---------+---------------+---------+-----------+----------+-------------------+ SFJ      Full                                                              +---------+---------------+---------+-----------+----------+-------------------+ FV Prox  Full                                                             +---------+---------------+---------+-----------+----------+-------------------+ FV Mid   Full                                                             +---------+---------------+---------+-----------+----------+-------------------+ FV Distal               Yes      Yes                                      +---------+---------------+---------+-----------+----------+-------------------+  PFV      Full                                                             +---------+---------------+---------+-----------+----------+-------------------+ POP      Full           Yes      Yes                                      +---------+---------------+---------+-----------+----------+-------------------+ PTV      Full                                                             +---------+---------------+---------+-----------+----------+-------------------+ PERO                                                  Not well visualized +---------+---------------+---------+-----------+----------+-------------------+    Summary: RIGHT: - No evidence of common femoral vein obstruction.   LEFT: - There is no evidence of deep vein thrombosis in the lower extremity. However, portions of this examination were limited- see technologist comments above.  - No cystic structure found in the popliteal fossa.  *See table(s) above for measurements and observations. Electronically signed by Lonni Gaskins MD on 03/24/2024 at 10:53:09 AM.    Final    US  Abdomen Limited RUQ (LIVER/GB) Result Date: 03/23/2024 EXAM: Right Upper Quadrant Abdominal Ultrasound 03/23/2024 01:30:11 PM TECHNIQUE: Real-time ultrasonography of the right upper quadrant of the abdomen was performed. COMPARISON: None available. CLINICAL HISTORY: Abdominal pain. FINDINGS:  LIVER: Increased echogenicity of hepatic parenchyma is noted suggesting hepatic steatosis. No intrahepatic biliary ductal dilatation. No evidence of mass. BILIARY SYSTEM: Gallbladder wall thickness measures 2.0 mm. Probable mild cholelithiasis or sludge is noted. No pericholecystic fluid. Negative sonographic Murphy's sign. Common bile duct measures 5.0 mm. OTHER: No right upper quadrant ascites. IMPRESSION: 1. Probable mild cholelithiasis or sludge. 2. Hepatic steatosis. Electronically signed by: Lynwood Seip MD 03/23/2024 01:46 PM EST RP Workstation: [id number]   DG Foot Complete Left Result Date: 03/23/2024 CLINICAL DATA:  Left foot pain and swelling. EXAM: LEFT FOOT - COMPLETE 3+ VIEW COMPARISON:  None Available. FINDINGS: No acute fracture or dislocation. The bones are mildly osteopenic. Degenerative changes of the midfoot and spurring of the dorsum of the tarsal bone. There is diffuse soft tissue swelling and subcutaneous edema predominantly involving the dorsum of the foot. No radiopaque foreign object or soft tissue gas. IMPRESSION: 1. No acute fracture or dislocation. 2. Diffuse soft tissue swelling and subcutaneous edema. Findings may represent cellulitis. Clinical correlation is recommended. Electronically Signed   By: Vanetta Chou M.D.   On: 03/23/2024 11:31   CT Chest Wo Contrast Result Date: 03/23/2024 CLINICAL DATA:  Blunt trauma to chest with right posterior rib pain. EXAM: CT CHEST WITHOUT CONTRAST TECHNIQUE: Multidetector CT imaging of the chest was performed following the standard protocol without IV contrast.  RADIATION DOSE REDUCTION: This exam was performed according to the departmental dose-optimization program which includes automated exposure control, adjustment of the mA and/or kV according to patient size and/or use of iterative reconstruction technique. COMPARISON:  CT abdomen 11/28/2017 FINDINGS: Cardiovascular: Heart is normal size. Thoracic aorta is normal caliber. Pulmonary  arterial system and remaining vascular structures are unremarkable on this noncontrast exam. Mediastinum/Nodes: No mediastinal or hilar adenopathy. Remaining mediastinal structures are normal. Lungs/Pleura: Lungs are adequately inflated without acute airspace process or effusion. Two adjacent 2 mm nodules over the right upper lobe. Tiny amount of right basilar pleural fluid with associated atelectasis. Airways are normal. Upper Abdomen: Moderate cholelithiasis. 1.7 cm hypodensity over the upper pole left kidney with Hounsfield unit measurements of 23 likely a no acute findings over the visualized upper abdomen. Musculoskeletal: No focal abnormality. IMPRESSION: 1. No acute cardiopulmonary disease. No acute fracture. 2. Two adjacent 2 mm nodules over the right upper lobe. No follow-up needed if patient is low-risk (and has no known or suspected primary neoplasm). Non-contrast chest CT can be considered in 12 months if patient is high-risk. This recommendation follows the consensus statement: Guidelines for Management of Incidental Pulmonary Nodules Detected on CT Images: From the Fleischner Society 2017; Radiology 2017; 284:228-243. 3. Moderate cholelithiasis. 4. 1.7 cm hypodensity over the upper pole left kidney likely a cyst. Recommend renal ultrasound for further evaluation. Electronically Signed   By: Toribio Agreste M.D.   On: 03/23/2024 11:27   CT T-SPINE NO CHARGE Result Date: 03/23/2024 CLINICAL DATA:  Chest trauma.  Right posterolateral rib pain. EXAM: CT Thoracic Spine without contrast TECHNIQUE: Multiplanar CT images of the thoracic spine were reconstructed from contemporary CT of the Chest. RADIATION DOSE REDUCTION: This exam was performed according to the departmental dose-optimization program which includes automated exposure control, adjustment of the mA and/or kV according to patient size and/or use of iterative reconstruction technique. CONTRAST:  None COMPARISON:  Concurrent chest CT.  Abdominopelvic CT 11/28/2017. Lumbar MRI 03/23/2020. FINDINGS: Alignment: Conventional anatomy with 12 rib-bearing thoracic type vertebral bodies. Mild convex right scoliosis. No focal angulation or listhesis. Vertebrae: No evidence of acute thoracic spine fracture or traumatic subluxation. No aggressive osseous lesions. Prominent paraspinal osteophytes consistent with diffuse idiopathic skeletal hyperostosis. Paraspinal and other soft tissues: No acute paraspinal findings are identified. Chest details dictated separately. Disc levels: No large disc herniation or high-grade spinal stenosis demonstrated within the thoracic spine. There is lower cervical spondylosis with disc bulging, endplate osteophytes and uncinate spurring at C6-7 and C7-T1. Previous left laminectomy at L1-2 with associated circumferential osteophytes and mild canal narrowing, similar to previous MRI. IMPRESSION: 1. No evidence of acute thoracic spine fracture, traumatic subluxation or static signs of instability. 2. Diffuse idiopathic skeletal hyperostosis. 3. Grossly stable postsurgical changes at L1-2. Electronically Signed   By: Elsie Perone M.D.   On: 03/23/2024 11:26    Labs: Recent Labs    03/24/24 0315 03/26/24 0321  WBC 12.8* 12.3*  RBC 3.68* 3.60*  HCT 31.9* 30.7*  PLT 434* 541*   Recent Labs    03/24/24 0315 03/26/24 0321  NA 139 140  K 3.6 3.6  CL 104 105  CO2 22 22  BUN 9 7*  CREATININE 0.83 0.83  GLUCOSE 87 72  CALCIUM  9.2 9.4   No results for input(s): LABPT, INR in the last 72 hours.  Review of Systems: ROS as detailed in HPI  Physical Exam: Body mass index is 36.22 kg/m.  Physical Exam   Gen:  AAOx3, NAD Comfortable at rest  Left Lower Extremity: Skin intact, moderate swelling without erythema TTP over entire foot and ankle No pain with small arcs of motion of ankle joint ADF/APF/EHL 5/5 SILT throughout DP, PT 2+ to palp CR < 2s   Assessment and Plan: Ortho consult for left  foot and ankle acute gout flare  -history, exam and imaging reviewed at length with patient -no acute surgical intervention -gout management per primary team -follow up outpatient with PCP -PT/OT  Lillia Mountain, MD Orthopaedic Surgeon [facility] [phone]

## 2024-03-26 NOTE — TOC Initial Note (Signed)
 Transition of Care [facility]) - Initial/Assessment Note    Patient Details  Name: BAYARD MORE MRN: [id number] Date of Birth: Nov 07, 1960  Transition of Care [facility]) CM/SW Contact:    NORMAN ASPEN, LCSW Phone Number: 03/26/2024, 3:23 PM  Clinical Narrative:                  Met with pt to introduce role and review potential dc planning needs.  Pt confirms that he lives alone and is independent at baseline.  He notes the current pain in his lower extremities has extremely limited his mobility and he is awaiting news from medical team regarding treatment plans.  Per chart review, orders have been placed for PT/OT evaluations.  At this time, will follow along and monitor recommendations.   Expected Discharge Plan:  (TBD) Barriers to Discharge: Continued Medical Work up   Patient Goals and CMS Choice Patient states their goals for this hospitalization and ongoing recovery are:: return home          Expected Discharge Plan and Services In-house Referral: Clinical Social Work     Living arrangements for the past 2 months: Single Family Home                                      Prior Living Arrangements/Services Living arrangements for the past 2 months: Single Family Home Lives with:: Self Patient language and need for interpreter reviewed:: Yes Do you feel safe going back to the place where you live?: Yes      Need for Family Participation in Patient Care: No (Comment) Care giver support system in place?: Yes (comment)   Criminal Activity/Legal Involvement Pertinent to Current Situation/Hospitalization: No - Comment as needed  Activities of Daily Living   ADL Screening (condition at time of admission) Independently performs ADLs?: Yes (appropriate for developmental age) Is the patient deaf or have difficulty hearing?: No Does the patient have difficulty seeing, even when wearing glasses/contacts?: No Does the patient have difficulty concentrating, remembering, or  making decisions?: No  Permission Sought/Granted Permission sought to share information with : Family Supports Permission granted to share information with : Yes, Verbal Permission Granted  Share Information with NAME: daughterShanda Polo @ [phone]           Emotional Assessment Appearance:: Appears stated age Attitude/Demeanor/Rapport: Engaged Affect (typically observed): Frustrated Orientation: : Oriented to Self, Oriented to Place, Oriented to  Time, Oriented to Situation Alcohol  / Substance Use: Not Applicable Psych Involvement: No (comment)  Admission diagnosis:  Foot swelling [M79.89] Cellulitis of left foot [L03.116] Patient Active Problem List   Diagnosis Date Noted   Pulmonary nodules 03/23/2024   Normocytic anemia 03/23/2024   Thrombocytosis 03/23/2024   Class II obesity 03/23/2024   Cellulitis of left foot 03/23/2024   Mild protein malnutrition 03/23/2024   Hyperlipidemia 03/23/2024   Lumbago of lumbar region with sciatica 08/31/2020   History of lumbar laminectomy 05/03/2020   Lumbar stenosis without neurogenic claudication 03/28/2020   HNP (herniated nucleus pulposus), lumbar 11/27/2018   Herniated intervertebral disc of lumbar spine 05/07/2018   Primary osteoarthritis of right hip 03/04/2016   Primary osteoarthritis of left hip 10/17/2015   Cerebral infarction (HCC) 09/08/2013   HTN (hypertension) 03/12/2012   Major depression, recurrent 03/10/2012   Anxiety disorder 03/10/2012   Leukocytosis 03/08/2012   Hyponatremia 03/08/2012   Type 2 diabetes mellitus (HCC) 03/29/2011   Hypothyroidism 03/29/2011  Depression 03/27/2011   H/O 03/27/2011   Memory impairment 03/27/2011   Alcohol  dependence (HCC) 03/26/2011   Substance-induced disorder (HCC) 03/26/2011   Cocaine  abuse (HCC) 03/26/2011   PCP:  [name], MD Pharmacy:   [facility] [phone] - [place], [place] - [address] AT [facility] [address] [place] [address] Phone: [phone] Fax: [phone]     Social Drivers of Health (SDOH) Social History: SDOH Screenings   Food Insecurity: No Food Insecurity (03/23/2024)  Housing: Low Risk  (03/23/2024)  Transportation Needs: No Transportation Needs (03/23/2024)  Utilities: Not At Risk (03/23/2024)  Tobacco Use: Medium Risk (03/23/2024)   SDOH Interventions:     Readmission Risk Interventions    03/26/2024    3:22 PM  Readmission Risk Prevention Plan  Transportation Screening Complete  PCP or Specialist Appt within 5-7 Days Complete  Home Care Screening Complete  Medication Review (RN CM) Complete

## 2024-03-26 NOTE — Progress Notes (Signed)
 PROGRESS NOTE Phillip Turner  FMW:[id number] DOB: 1960-12-17 DOA: 03/23/2024 PCP: [name], MD  Brief Narrative/Hospital Course: Phillip Turner is a 63 y.o. male with PMH of alcohol  abuse in remission, anxiety, arthritis, depression, type 2 diabetes, hyperlipidemia, hypertension, hypothyroidism, rheumatic fever as a teenager, sickle cell trait, history of CVA with mild residual right-sided weakness and occasional speech disturbance, history of cocaine  abuse in remission who presented to the ED with left foot pain and edema that he states is similar to his prior acute gout episodes for the past 7 days has not responded to his colchicine  use. In ED: Initial vital signs were temperature 98.7 F, pulse 73, respirations 16, BP 149/97 mmHg and O2 sat 94% on room air.  Lab work: CBC showed white count of 14.5, hemoglobin 11.7 g/dL and platelets 572.  BMP/chemistry showed a CO2 of 20 mmol/L with a normal anion gap, glucose 109 and uric acid 6.7 mg/dL.  The rest of the electrolytes and renal function were normal. left foot x-ray>>no fracture or dislocation.  Diffuse soft tissue swelling and subcutaneous edema that may represent cellulitis. CT chest without>>no acute cardiopulmonary disease. 2 adjacent 2 mm nodules over the right upper lobe.  No follow-up negative if the patient is low risk ((and has no known more suspected primary neoplasm).  If high risk noncontrast chest CT in 12 months.Moderate cholelithiasis.1.7 cm left upper pole kidney lesion that is likely a cyst.  Ultrasound imaging recommended for further evaluation.  Subjective: Seen and examined today Feels some better, less painful and swelling on left foot Not abel to bear weight due to pain Overnight he remains afebrile BP stable Labs with mild leukocytosis stable renal function He went for MRI this morning  Assessment and plan:  Pain swelling on b/l ankles and foot x 2 wks and worsening Suspected cellulitis of left foot>  right Chronic Gout on chronic colchicine : Left foot/ankle pain and swelling worse than the right and progressively worsening, despite taking home colchicine  bid Admitted with possible cellulitis-on IV ceftriaxone , vancomycin  added 11/20,. Blood cx NGTD from 11/19 Duplex negative for DVT,ESR CRP up, CT finding concerning for tendinitis calcification-changes of chronic gout. Dr Barton consulted as not much improved-MRI ordered for further evaluation MRI pending result- discussed w/ Dr Barton who will get back to me after mri result He had similar episode 10 yrs ago at home and was bed bound briefly, unable ot bear weight- ptot eval and pain management  Chronic back pain-Lumbar stenosis without neurogenic claudication Depression: He is on 3 monthly steroid injection on bacl cont pain management, antispasmodic-added. His mood is stable continue trazodone  100 mg p.o. at bedtime.   Type 2 diabetes mellitus: A1c 5.6. Well-controlledo altho 63 this am- cut back ssi.Continue sliding scale holding home dulaglutide  Recent Labs  Lab 03/24/24 0315 03/24/24 0740 03/25/24 1120 03/25/24 1625 03/25/24 2120 03/26/24 0727 03/26/24 0759  GLUCAP  --    < > 141* 91 120* 63* 88  HGBA1C 5.6  --   --   --   --   --   --    < > = values in this interval not displayed.      Hyperlipidemia Cont atorvastatin   Hypothyroidism Continue home levothyroxine  137 mcg p.o. daily.  HTN Stable. continue metoprolol .  Added hydralazine  prn.   Normocytic anemia Monitor hematocrit and hemoglobin.   Thrombocytosis In the setting of anemia and acute infection.   Mild protein malnutrition In the setting of acute infection.  Augmentin diet  2  mm nodules over the right upper lobe.: No follow-up negative if the patient is low risk ((and has no known more suspected primary neoplasm). If high risk noncontrast chest CT in 12 months.-Patient needs to be instructed before discharge   Moderate  cholelithiasis: incidentally noticed   1.7 cm left upper pole kidney lesion that is likely a cyst: Ultrasound imaging recommended for further evaluation once stable  Class II Obesity w/ Body mass index is 36.22 kg/m.: Will benefit with PCP follow-up, weight loss,healthy lifestyle and outpatient sleep eval if not done.  Mobility: PT Orders: Active PT Follow up Rec:     DVT prophylaxis: Lovenox  Code Status:   Code Status: Full Code Family Communication: plan of care discussed with patien at bedside. Patient status is: Remains hospitalized because of severity of illness Level of care: Med-Surg   Dispo: The patient is from: HOME            Anticipated disposition: TBD Objective: Vitals last 24 hrs: Vitals:   03/25/24 1743 03/25/24 2123 03/26/24 0609 03/26/24 0804  BP: (!) 158/88 (!) 144/75 (!) 144/77 131/76  Pulse: 90 66 66 80  Resp: 16 18 18 18   Temp: 98.1 F (36.7 C) 98.5 F (36.9 C) 99.2 F (37.3 C) 98.8 F (37.1 C)  TempSrc: Oral Oral Oral Oral  SpO2: 97% 94% 98% 98%  Weight:      Height:       Physical Examination: General exam: AAOX3 HEENT:Oral mucosa moist, Ear/Nose WNL grossly. Respiratory system: cta b/l. Cardiovascular system: S1 & S2 +, No JVD. Gastrointestinal system: Abdomen soft,NT,ND, BS+. Nervous System: Alert, awake, moving all extremities,and following commands. Extremities: extremities warm edema tenderness bilateral ankle and feet more on the left, tophaceous changes on his hands Skin: Warm, no rashes. MSK: Normal muscle bulk,tone, power.   Medications reviewed:  Scheduled Meds:  aspirin  EC  325 mg Oral Daily   atorvastatin   10 mg Oral QHS   colchicine   0.6 mg Oral BID   empagliflozin   25 mg Oral Daily   enoxaparin  (LOVENOX ) injection  60 mg Subcutaneous Q24H   feeding supplement  237 mL Oral BID BM   folic acid   1 mg Oral Daily   insulin  aspart  0-6 Units Subcutaneous TID WC   levothyroxine   137 mcg Oral QAC breakfast   metoprolol   succinate  25 mg Oral BID   ondansetron   8 mg Oral Once   traZODone   200 mg Oral QHS   Continuous Infusions:  cefTRIAXone  (ROCEPHIN )  IV 2 g (03/25/24 1217)   vancomycin  1,250 mg (03/26/24 9386)   Diet: Diet Order             Diet heart healthy/carb modified Room service appropriate? Yes; Fluid consistency: Thin  Diet effective now                    Data Reviewed: I have personally reviewed following labs and imaging studies ( see epic result tab) CBC: Recent Labs  Lab 03/23/24 1046 03/24/24 0315 03/26/24 0321  WBC 14.5* 12.8* 12.3*  HGB 11.7* 10.3* 10.1*  HCT 35.2* 31.9* 30.7*  MCV 85.4 86.7 85.3  PLT 427* 434* 541*   CMP: Recent Labs  Lab 03/23/24 1046 03/24/24 0315 03/26/24 0321  NA 140 139 140  K 3.7 3.6 3.6  CL 105 104 105  CO2 20* 22 22  GLUCOSE 109* 87 72  BUN 8 9 7*  CREATININE 0.96 0.83 0.83  CALCIUM  9.1 9.2 9.4  GFR: Estimated Creatinine Clearance: 115.4 mL/min (by C-G formula based on SCr of 0.83 mg/dL). Recent Labs  Lab 03/23/24 1519 03/24/24 0315  AST 40 31  ALT 28 24  ALKPHOS 123 108  BILITOT 1.1 0.9  PROT 7.2 6.5  ALBUMIN  3.2* 3.0*    Recent Labs  Lab 03/23/24 1519  LIPASE 25   No results for input(s): AMMONIA in the last 168 hours. Coagulation Profile: No results for input(s): INR, PROTIME in the last 168 hours. Unresulted Labs (From admission, onward)     Start     Ordered   03/26/24 0500  Basic metabolic panel with GFR  Daily,   R      03/25/24 0935   03/26/24 0500  CBC  Daily,   R      03/25/24 0935           Antimicrobials/Microbiology: Anti-infectives (From admission, onward)    Start     Dose/Rate Route Frequency Ordered Stop   03/26/24 0600  vancomycin  (VANCOREADY) IVPB 1250 mg/250 mL        1,250 mg 166.7 mL/hr over 90 Minutes Intravenous Every 12 hours 03/25/24 1821     03/25/24 1600  vancomycin  (VANCOREADY) IVPB 2000 mg/400 mL        2,000 mg 200 mL/hr over 120 Minutes Intravenous  Once 03/25/24  1514 03/25/24 1800   03/24/24 1300  cefTRIAXone  (ROCEPHIN ) 2 g in sodium chloride  0.9 % 100 mL IVPB        2 g 200 mL/hr over 30 Minutes Intravenous Every 24 hours 03/23/24 1431     03/23/24 1300  cefTRIAXone  (ROCEPHIN ) 2 g in sodium chloride  0.9 % 100 mL IVPB        2 g 200 mL/hr over 30 Minutes Intravenous  Once 03/23/24 1250 03/23/24 1434         Component Value Date/Time   SDES  03/24/2024 1206    BLOOD BLOOD LEFT ARM Performed at [facility], [address], [place], [place] [address]    SDES  03/24/2024 1206    BLOOD BLOOD LEFT ARM Performed at [facility], [address] [place], [place] [address]    SPECREQUEST  03/24/2024 1206    BOTTLES DRAWN AEROBIC AND ANAEROBIC Blood Culture adequate volume Performed at [facility], [address], [place], [place] [address]    SPECREQUEST  03/24/2024 1206    BOTTLES DRAWN AEROBIC AND ANAEROBIC Blood Culture adequate volume Performed at [facility], [address], [place], [place] [address]    CULT  03/24/2024 1206    NO GROWTH 2 DAYS Performed at [facility], [address], [place], [place] [address]    CULT  03/24/2024 1206    NO GROWTH 2 DAYS Performed at [facility], [address] [place], [place] [address]    REPTSTATUS PENDING 03/24/2024 1206   REPTSTATUS PENDING 03/24/2024 1206    Procedures:    Mennie LAMY, MD [facility] 03/26/2024, 1:49 PM

## 2024-03-26 NOTE — Inpatient Diabetes Management (Signed)
 Inpatient Diabetes Program Recommendations  AACE/ADA: New Consensus Statement on Inpatient Glycemic Control (2015)  Target Ranges:  Prepandial:   less than 140 mg/dL      Peak postprandial:   less than 180 mg/dL (1-2 hours)      Critically ill patients:  140 - 180 mg/dL   Lab Results  Component Value Date   GLUCAP 88 03/26/2024   HGBA1C 5.6 03/24/2024    Review of Glycemic Control  Latest Reference Range & Units 03/25/24 07:26 03/25/24 11:20 03/25/24 16:25 03/25/24 21:20 03/26/24 07:27 03/26/24 07:59  Glucose-Capillary 70 - 99 mg/dL 899 (H) 858 (H)  Novolog  3 units  91 120 (H) 63 (L) 88  (H): Data is abnormally high (L): Data is abnormally low  Diabetes history: DM2 Outpatient Diabetes medications: Jardiance  25 mg every day, Trulicity  3 mg weekly Current orders for Inpatient glycemic control: Novolog  0-6 units TID and Jardiance  25 mg QD  Inpatient Diabetes Program Recommendations:    Might consider discontinuing Novolog  0-6 units TID as glucose trends are within hospital goal.    Thank you, Wyvonna Pinal, MSN, CDCES Diabetes Coordinator Inpatient Diabetes Program [phone] (team pager from 8a-5p)

## 2024-03-26 NOTE — Plan of Care (Signed)

## 2024-03-27 DIAGNOSIS — L03116 Cellulitis of left lower limb: Secondary | ICD-10-CM | POA: Diagnosis not present

## 2024-03-27 LAB — CBC
HCT: 28.3 % — ABNORMAL LOW (ref 39.0–52.0)
Hemoglobin: 9.1 g/dL — ABNORMAL LOW (ref 13.0–17.0)
MCH: 27.3 pg (ref 26.0–34.0)
MCHC: 32.2 g/dL (ref 30.0–36.0)
MCV: 85 fL (ref 80.0–100.0)
Platelets: 546 K/uL — ABNORMAL HIGH (ref 150–400)
RBC: 3.33 MIL/uL — ABNORMAL LOW (ref 4.22–5.81)
RDW: 17.9 % — ABNORMAL HIGH (ref 11.5–15.5)
WBC: 11.6 K/uL — ABNORMAL HIGH (ref 4.0–10.5)
nRBC: 0 % (ref 0.0–0.2)

## 2024-03-27 LAB — GLUCOSE, CAPILLARY
Glucose-Capillary: 80 mg/dL (ref 70–99)
Glucose-Capillary: 91 mg/dL (ref 70–99)
Glucose-Capillary: 156 mg/dL — ABNORMAL HIGH (ref 70–99)
Glucose-Capillary: 121 mg/dL — ABNORMAL HIGH (ref 70–99)

## 2024-03-27 LAB — BASIC METABOLIC PANEL WITH GFR
Anion gap: 12 (ref 5–15)
BUN: 7 mg/dL — ABNORMAL LOW (ref 8–23)
CO2: 22 mmol/L (ref 22–32)
Calcium: 9.2 mg/dL (ref 8.9–10.3)
Chloride: 106 mmol/L (ref 98–111)
Creatinine, Ser: 0.71 mg/dL (ref 0.61–1.24)
GFR, Estimated: >60 mL/min (ref >60)
Glucose, Bld: 83 mg/dL (ref 70–99)
Potassium: 2.8 mmol/L — ABNORMAL LOW (ref 3.5–5.1)
Sodium: 140 mmol/L (ref 135–145)

## 2024-03-27 MED ORDER — SODIUM CHLORIDE 0.9 % IV SOLN
2.0000 g | INTRAVENOUS | Status: AC
  Administered 2024-03-27 – 2024-03-28 (×2): 2 g via INTRAVENOUS
  Filled 2024-03-27 (×2): qty 20

## 2024-03-27 MED ORDER — POTASSIUM CHLORIDE CRYS ER 20 MEQ PO TBCR
40.0000 meq | EXTENDED_RELEASE_TABLET | ORAL | Status: AC
  Administered 2024-03-27 (×2): 40 meq via ORAL
  Filled 2024-03-27 (×2): qty 2

## 2024-03-27 MED ORDER — POTASSIUM CHLORIDE 10 MEQ/100ML IV SOLN
10.0000 meq | INTRAVENOUS | Status: AC
  Administered 2024-03-27 (×2): 10 meq via INTRAVENOUS
  Filled 2024-03-27 (×2): qty 100

## 2024-03-27 MED ORDER — METHYLPREDNISOLONE SODIUM SUCC 125 MG IJ SOLR
60.0000 mg | Freq: Two times a day (BID) | INTRAMUSCULAR | Status: DC
  Administered 2024-03-27 – 2024-03-31 (×9): 60 mg via INTRAVENOUS
  Filled 2024-03-27 (×9): qty 2

## 2024-03-27 NOTE — Evaluation (Signed)
 Occupational Therapy Evaluation Patient Details Name: Phillip Turner MRN: [id number] DOB: Nov 06, 1960 Today's Date: 03/27/2024   History of Present Illness   Phillip Turner is a 63 y.o. male who presented to the ED with left foot pain and edema that he states is similar to his prior acute gout episodes for the past 7 days has not responded to his colchicine  use. Admitted to [facility] for w/o Pain swelling on b/l ankles and foot x 2 wks and worsening  Suspected cellulitis of left foot> right  Chronic Gout on chronic colchicine ; ortho c/s reveals follow gout treatment   PMH: alcohol  abuse in remission, anxiety, arthritis, depression, type 2 diabetes, hyperlipidemia, hypertension, hypothyroidism, rheumatic fever as a teenager, sickle cell trait, history of CVA with mild residual right-sided weakness and occasional speech disturbance, history of cocaine  abuse in remission     Clinical Impressions PTA, patient lives at home alone in level access senior apt reporting he does not have community support and has been independent for all mobility without AD, driving and uses tub bench for showers and indep with all other IADL's. Presents with lower frustration tolerance when obtaining plof. Currently, patient presents with deficits outlined below (see OT Problem List for details) most significantly pain, increased body habitus, decreased functional reach, balance, activity tolerance and lower frustration tolerance for BADL's (mod-max A LB) and functional mobility (+2 SPT bari BSC only) performance. Based on current functional presentation and home situation, patient will benefit from continued inpatient follow up therapy, <3 hours/day. Patient requires continued Acute care hospital level OT services to progress safety and functional performance and allow for discharge.       If plan is discharge home, recommend the following:   Two people to help with walking and/or transfers;A lot of help with  bathing/dressing/bathroom;Assistance with cooking/housework;Assist for transportation;Help with stairs or ramp for entrance     Functional Status Assessment   Patient has had a recent decline in their functional status and demonstrates the ability to make significant improvements in function in a reasonable and predictable amount of time.     Equipment Recommendations   BSC/3in1      Precautions/Restrictions   Precautions Precautions: Fall Restrictions Weight Bearing Restrictions Per Provider Order: No     Mobility Bed Mobility Overal bed mobility: Needs Assistance Bed Mobility: Sit to Supine       Sit to supine: Min assist, HOB elevated, Used rails   General bed mobility comments: L LE management    Transfers Overall transfer level: Needs assistance Equipment used: None (allowed gait belt but refused RW support) Transfers: Sit to/from Stand, Bed to chair/wheelchair/BSC Sit to Stand: Mod assist, +2 physical assistance, +2 safety/equipment, From elevated surface     Step pivot transfers: Mod assist, +2 physical assistance, +2 safety/equipment, From elevated surface     General transfer comment: remained forward trunk refusing UE support of RW, requested gait belt and OT/nursing anterior and posterior support many STS's and step pivot from bari commode to bed      Balance Overall balance assessment: Needs assistance Sitting-balance support: Feet supported, No upper extremity supported Sitting balance-Leahy Scale: Fair     Standing balance support: Bilateral upper extremity supported, During functional activity Standing balance-Leahy Scale: Poor Standing balance comment: stood at least 10 times for STS for posterior buttocks hygiene and then for transfers refusing UE support of RW device  ADL either performed or assessed with clinical judgement   ADL Overall ADL's : Needs assistance/impaired Eating/Feeding: Modified  independent   Grooming: Wash/dry hands;Wash/dry face;Oral care;Set up;Sitting   Upper Body Bathing: Minimal assistance;Sitting Upper Body Bathing Details (indicate cue type and reason): commode level Lower Body Bathing: Moderate assistance;Sit to/from stand   Upper Body Dressing : Minimal assistance;Sitting   Lower Body Dressing: Maximal assistance;Sit to/from stand   Toilet Transfer: Moderate assistance;+2 for physical assistance;+2 for safety/equipment;Requires wide/bariatric   Toileting- Clothing Manipulation and Hygiene: Minimal assistance;Sit to/from stand Toileting - Clothing Manipulation Details (indicate cue type and reason): with excessive forward trunk posture     Functional mobility during ADLs: Moderate assistance;+2 for physical assistance;+2 for safety/equipment General ADL Comments: pain limited functional reach and tolerance     Vision Baseline Vision/History: 1 Wears glasses;5 Retinopathy Ability to See in Adequate Light: 1 Impaired Patient Visual Report: Blurring of vision;No change from baseline Additional Comments: reports adequate for phone use and tv but sporatic bluriness and needs eye drops            Pertinent Vitals/Pain Pain Assessment Pain Assessment: 0-10 Pain Score: 8  Pain Location: B LE's L>R Pain Descriptors / Indicators: Aching, Burning, Constant, Tingling, Throbbing, Tender Pain Intervention(s): Limited activity within patient's tolerance, Monitored during session, Premedicated before session, Repositioned, RN gave pain meds during session, Other (comment) (pillow props post session)     Extremity/Trunk Assessment Upper Extremity Assessment Upper Extremity Assessment: Right hand dominant;Overall [facility] for tasks assessed   Lower Extremity Assessment Lower Extremity Assessment: Defer to PT evaluation   Cervical / Trunk Assessment Cervical / Trunk Assessment: Lordotic;Other exceptions (increased body habitus)   Communication  Communication Communication: No apparent difficulties   Cognition Arousal: Alert Behavior During Therapy: Lability (low frustration tolerance, particular/set in ways) Cognition: No apparent impairments                               Following commands: Intact       Cueing  General Comments   Cueing Techniques: Verbal cues  L>R LE edema present +2, low activity tolerance for mobility due to pain but no SOB and SpO2 remained 100% during session on RA           Home Living Family/patient expects to be discharged to:: Private residence Living Arrangements: Alone Available Help at Discharge: Other (Comment) (reports no assistance available as family are no longer in the picture) Type of Home: House Home Access: Level entry     Home Layout: One level     Bathroom Shower/Tub: Tub/shower unit;Curtain   Bathroom Toilet: Handicapped height Bathroom Accessibility: Yes How Accessible: Accessible via walker Home Equipment: Tub bench;Rolling Walker (2 wheels);Hand held shower head   Additional Comments: guarded in responses but reports he is totally alone and does everything for himself and decreased frustration tolerance when questioned in more detail      Prior Functioning/Environment Prior Level of Function : Independent/Modified Independent             Mobility Comments: reports he has stopped usign any device and was indep incl community ? ADLs Comments: reports mod I for tub shower access all B/IADL's, groceries and driving    OT Problem List: Decreased activity tolerance;Impaired balance (sitting and/or standing);Obesity;Pain;Increased edema   OT Treatment/Interventions: Self-care/ADL training;Therapeutic exercise;Neuromuscular education;Energy conservation;DME and/or AE instruction;Therapeutic activities;Patient/family education;Balance training      OT Goals(Current goals can be found in the  care plan section)   Acute Rehab OT Goals Patient  Stated Goal: to have less pain OT Goal Formulation: With patient Time For Goal Achievement: 04/10/24 Potential to Achieve Goals: Fair ADL Goals Pt Will Perform Lower Body Bathing: with contact guard assist;sit to/from stand;with adaptive equipment Pt Will Perform Lower Body Dressing: with min assist;sit to/from stand;with adaptive equipment Pt Will Transfer to Toilet: with contact guard assist;bedside commode;stand pivot transfer Pt Will Perform Toileting - Clothing Manipulation and hygiene: with set-up;sit to/from stand Pt Will Perform Tub/Shower Transfer: Tub transfer;Stand pivot transfer;tub bench;with min assist   OT Frequency:  Min 2X/week       AM-PAC OT 6 Clicks Daily Activity     Outcome Measure Help from another person eating meals?: None Help from another person taking care of personal grooming?: A Little Help from another person toileting, which includes using toliet, bedpan, or urinal?: A Lot Help from another person bathing (including washing, rinsing, drying)?: A Lot Help from another person to put on and taking off regular upper body clothing?: A Little Help from another person to put on and taking off regular lower body clothing?: A Lot 6 Click Score: 16   End of Session Equipment Utilized During Treatment: Gait belt Nurse Communication: Mobility status;Weight bearing status;Other (comment) (present for session intermittently)  Activity Tolerance: Patient limited by pain Patient left: in bed;with call bell/phone within reach;with bed alarm set;with nursing/sitter in room (prolonged time on [facility] then patient refused recliner reporting LE's would not be elevated enough)  OT Visit Diagnosis: Unsteadiness on feet (R26.81);Other abnormalities of gait and mobility (R26.89);Pain Pain - Right/Left:  (B) Pain - part of body: Ankle and joints of foot                Time: 8687-8642 OT Time Calculation (min): 45 min Charges:  OT General Charges $OT Visit: 1 Visit OT  Evaluation $OT Eval Low Complexity: 1 Low OT Treatments $Self Care/Home Management : 23-37 mins  Phillip Irizarry OT/L Acute Rehabilitation Department  [phone]  03/27/2024, 2:29 PM

## 2024-03-27 NOTE — Progress Notes (Signed)
 PROGRESS NOTE Phillip Turner  FMW:[id number] DOB: 1960/08/29 DOA: 03/23/2024 PCP: Phillip Atlas, MD  Brief Narrative/Hospital Course: Phillip Turner is a 63 y.o. male with PMH of alcohol  abuse in remission, anxiety, arthritis, depression, type 2 diabetes, hyperlipidemia, hypertension, hypothyroidism, rheumatic fever as a teenager, sickle cell trait, history of CVA with mild residual right-sided weakness and occasional speech disturbance, history of cocaine  abuse in remission who presented to the ED with left foot pain and edema that he states is similar to his prior acute gout episodes for the past 7 days has not responded to his colchicine  use. In ED: Initial vital signs were temperature 98.7 F, pulse 73, respirations 16, BP 149/97 mmHg and O2 sat 94% on room air.  Lab work: CBC showed white count of 14.5, hemoglobin 11.7 g/dL and platelets 572.  BMP/chemistry showed a CO2 of 20 mmol/L with a normal anion gap, glucose 109 and uric acid 6.7 mg/dL.  The rest of the electrolytes and renal function were normal. left foot x-ray>>no fracture or dislocation.  Diffuse soft tissue swelling and subcutaneous edema that may represent cellulitis. CT chest without>>no acute cardiopulmonary disease. 2 adjacent 2 mm nodules over the right upper lobe.  No follow-up negative if the patient is low risk ((and has no known more suspected primary neoplasm).  If high risk noncontrast chest CT in 12 months.Moderate cholelithiasis.1.7 cm left upper pole kidney lesion that is likely a cyst. Managed w/ IV antibiotics which was adjusted to ceftriaxone  and vancomycin > no significant improvement Seen by orthopedics subsequently abdomen MRI left foot and ankle-see report patient is versus erosive changes tendinitis calcification soft tissue edema and other findings of gout  Subjective: Seen and examined Alert awake oriented resting comfortably still having painful swelling and difficulty ambulation on the bilateral feet and  ankle Feels swelling is going down as he is keeping it up. Overnight afebrile BP stable on room air Labs with hypokalemia 2.8 CBC with thrombocytosis and anemia mild leukocytosis  Assessment and plan:  Pain swelling on b/l ankles and foot x 2 wks and worsening Suspected cellulitis of left foot> right Acute on chronic Gout on bilateral ankle/foot- PTA on chronic colchicine : Left foot/ankle pain and swelling worse than the right and progressively worsening, despite taking home colchicine  bid Admitted with possible cellulitis-Managed w/ IV antibiotics which was adjusted to ceftriaxone  and vancomycin > no significant improvement Blood cx NGTD from 11/19. Duplex negative for DVT,ESR CRP up, CT finding concerning for tendinitis calcification-changes of chronic gout. Dr Barton consulted s/p MRI left foot and ankle-see report patient is versus erosive changes tendinitis calcification soft tissue edema and other findings of gout.  Start Solu-Medrol  IV and wean quickly based upon how blood sugar dose. Continue multimodal pain management PT OT He had similar episode 10 yrs ago at home and was bed bound briefly, unable ot bear weight- ptot eval and pain management Dc vancomycin , keep rocephin  one more days. He states allopurinol  makes his leg swell.  Chronic back pain-Lumbar stenosis without neurogenic claudication Depression: He is on 3 monthly steroid injection on bacl cont pain management, antispasmodic-added. His mood is stable continue trazodone  100 mg p.o. at bedtime.   Type 2 diabetes mellitus: A1c 5.6. Well-controlled-to lower side, watch closely while starting Steroid.Continue sliding scale holding home dulaglutide  Recent Labs  Lab 03/24/24 0315 03/24/24 0740 03/26/24 0759 03/26/24 1124 03/26/24 1721 03/26/24 2137 03/27/24 0737  GLUCAP  --    < > 88 110* 130* 101* 80  HGBA1C 5.6  --   --   --   --   --   --    < > =  values in this interval not displayed.       Hypokalemia: Replace  Hyperlipidemia Cont atorvastatin   Hypothyroidism Continue home levothyroxine  137 mcg p.o. daily.  HTN Stable. continue metoprolol  and hydralazine  prn.   Normocytic anemia Monitor hematocrit and hemoglobin.   Thrombocytosis In the setting of anemia and acute infection.   Mild protein malnutrition In the setting of acute infection.  Augmentin diet  2 mm nodules over the right upper lobe.: No follow-up negative if the patient is low risk ((and has no known more suspected primary neoplasm). If high risk noncontrast chest CT in 12 months.-Patient needs to be instructed before discharge   Moderate cholelithiasis: incidentally noticed   1.7 cm left upper pole kidney lesion that is likely a cyst: Ultrasound imaging recommended for further evaluation once stable  Class II Obesity w/ Body mass index is 36.22 kg/m.: Will benefit with PCP follow-up, weight loss,healthy lifestyle and outpatient sleep eval if not done.  Mobility: PT Orders: Active PT Follow up Rec:     DVT prophylaxis: Lovenox  Code Status:   Code Status: Full Code Family Communication: plan of care discussed with patien at bedside. Patient status is: Remains hospitalized because of severity of illness Level of care: Med-Surg   Dispo: The patient is from: HOME            Anticipated disposition: TBD Objective: Vitals last 24 hrs: Vitals:   03/26/24 2135 03/26/24 2223 03/27/24 0537 03/27/24 0920  BP: (!) 149/77 (!) 149/77 (!) 145/87 (!) 150/85  Pulse: 75  70 70  Resp: 18  17   Temp: 98.9 F (37.2 C)  98.3 F (36.8 C)   TempSrc: Oral  Oral   SpO2: 98%  97%   Weight:      Height:       Physical Examination: General exam: AAOX3 HEENT:Oral mucosa moist, Ear/Nose WNL grossly. Respiratory system: cta b/l. Cardiovascular system: S1 & S2 +, No JVD. Gastrointestinal system: Abdomen soft,NT,ND, BS+. Nervous System: Alert, awake, moving all extremities,and following  commands. Extremities:  b/l foot/ankle tender swollen more on the left Skin: Warm, no rashes. MSK: Normal muscle bulk,tone, power.   Medications reviewed:  Scheduled Meds:  aspirin  EC  325 mg Oral Daily   atorvastatin   10 mg Oral QHS   colchicine   0.6 mg Oral BID   empagliflozin   25 mg Oral Daily   enoxaparin  (LOVENOX ) injection  60 mg Subcutaneous Q24H   feeding supplement  237 mL Oral BID BM   folic acid   1 mg Oral Daily   insulin  aspart  0-6 Units Subcutaneous TID WC   levothyroxine   137 mcg Oral QAC breakfast   methylPREDNISolone  (SOLU-MEDROL ) injection  60 mg Intravenous Q12H   metoprolol  succinate  25 mg Oral BID   ondansetron   8 mg Oral Once   potassium chloride   40 mEq Oral Q3H   traZODone   200 mg Oral QHS   Continuous Infusions:  cefTRIAXone  (ROCEPHIN )  IV     potassium chloride  10 mEq (03/27/24 1057)   Diet: Diet Order             Diet heart healthy/carb modified Room service appropriate? Yes; Fluid consistency: Thin  Diet effective now                    Data Reviewed: I have personally reviewed following labs and imaging studies ( see epic result tab) CBC: Recent Labs  Lab 03/23/24 1046 03/24/24 0315 03/26/24 0321 03/27/24 0312  WBC 14.5* 12.8*  12.3* 11.6*  HGB 11.7* 10.3* 10.1* 9.1*  HCT 35.2* 31.9* 30.7* 28.3*  MCV 85.4 86.7 85.3 85.0  PLT 427* 434* 541* 546*   CMP: Recent Labs  Lab 03/23/24 1046 03/24/24 0315 03/26/24 0321 03/27/24 0312  NA 140 139 140 140  K 3.7 3.6 3.6 2.8*  CL 105 104 105 106  CO2 20* 22 22 22   GLUCOSE 109* 87 72 83  BUN 8 9 7* 7*  CREATININE 0.96 0.83 0.83 0.71  CALCIUM  9.1 9.2 9.4 9.2   GFR: Estimated Creatinine Clearance: 119.8 mL/min (by C-G formula based on SCr of 0.71 mg/dL). Recent Labs  Lab 03/23/24 1519 03/24/24 0315  AST 40 31  ALT 28 24  ALKPHOS 123 108  BILITOT 1.1 0.9  PROT 7.2 6.5  ALBUMIN  3.2* 3.0*    Recent Labs  Lab 03/23/24 1519  LIPASE 25   No results for input(s): AMMONIA in the  last 168 hours. Coagulation Profile: No results for input(s): INR, PROTIME in the last 168 hours. Unresulted Labs (From admission, onward)     Start     Ordered   03/26/24 0500  Basic metabolic panel with GFR  Daily,   R      03/25/24 0935   03/26/24 0500  CBC  Daily,   R      03/25/24 0935           Antimicrobials/Microbiology: Anti-infectives (From admission, onward)    Start     Dose/Rate Route Frequency Ordered Stop   03/27/24 1300  cefTRIAXone  (ROCEPHIN ) 2 g in sodium chloride  0.9 % 100 mL IVPB        2 g 200 mL/hr over 30 Minutes Intravenous Every 24 hours 03/27/24 1122 03/29/24 1259   03/26/24 0600  vancomycin  (VANCOREADY) IVPB 1250 mg/250 mL  Status:  Discontinued        1,250 mg 166.7 mL/hr over 90 Minutes Intravenous Every 12 hours 03/25/24 1821 03/27/24 1121   03/25/24 1600  vancomycin  (VANCOREADY) IVPB 2000 mg/400 mL        2,000 mg 200 mL/hr over 120 Minutes Intravenous  Once 03/25/24 1514 03/25/24 1800   03/24/24 1300  cefTRIAXone  (ROCEPHIN ) 2 g in sodium chloride  0.9 % 100 mL IVPB  Status:  Discontinued        2 g 200 mL/hr over 30 Minutes Intravenous Every 24 hours 03/23/24 1431 03/27/24 1122   03/23/24 1300  cefTRIAXone  (ROCEPHIN ) 2 g in sodium chloride  0.9 % 100 mL IVPB        2 g 200 mL/hr over 30 Minutes Intravenous  Once 03/23/24 1250 03/23/24 1434         Component Value Date/Time   SDES  03/24/2024 1206    BLOOD BLOOD LEFT ARM Performed at [facility], [address], [place], [place] [address]    SDES  03/24/2024 1206    BLOOD BLOOD LEFT ARM Performed at [facility], [address] [place], [place] [address]    SPECREQUEST  03/24/2024 1206    BOTTLES DRAWN AEROBIC AND ANAEROBIC Blood Culture adequate volume Performed at [facility] [place], [address], [place], [place] [address]    SPECREQUEST  03/24/2024 1206    BOTTLES DRAWN AEROBIC AND ANAEROBIC Blood Culture adequate  volume Performed at [facility], [address], [place], [place] [address]    CULT  03/24/2024 1206    NO GROWTH 3 DAYS Performed at [facility], [address] [place], [place] [address]    CULT  03/24/2024 1206    NO GROWTH 3 DAYS Performed at [facility], [address], [place], [place] [address]    REPTSTATUS PENDING 03/24/2024 1206   REPTSTATUS PENDING 03/24/2024 1206    Procedures:    Mennie LAMY, MD [facility] 03/27/2024, 11:25 AM

## 2024-03-27 NOTE — Progress Notes (Signed)
 PT Cancellation Note  Patient Details Name: Phillip Turner MRN: [id number] DOB: 19-Dec-1960   Cancelled Treatment:    Reason Eval/Treat Not Completed: Patient declined, no reason specified. Pt with multiple reasons why he does not want to mobilize today. Continue efforts    [facility] 03/27/2024, 1:24 PM

## 2024-03-27 NOTE — Plan of Care (Signed)
   Problem: Coping: Goal: Level of anxiety will decrease Outcome: Progressing   Problem: Pain Managment: Goal: General experience of comfort will improve and/or be controlled Outcome: Progressing   Problem: Safety: Goal: Ability to remain free from injury will improve Outcome: Progressing

## 2024-03-28 DIAGNOSIS — E1100 Type 2 diabetes mellitus with hyperosmolarity without nonketotic hyperglycemic-hyperosmolar coma (NKHHC): Secondary | ICD-10-CM

## 2024-03-28 DIAGNOSIS — E66812 Obesity, class 2: Secondary | ICD-10-CM | POA: Diagnosis not present

## 2024-03-28 DIAGNOSIS — L03116 Cellulitis of left lower limb: Secondary | ICD-10-CM | POA: Diagnosis not present

## 2024-03-28 DIAGNOSIS — I10 Essential (primary) hypertension: Secondary | ICD-10-CM | POA: Diagnosis not present

## 2024-03-28 DIAGNOSIS — M7989 Other specified soft tissue disorders: Secondary | ICD-10-CM | POA: Diagnosis not present

## 2024-03-28 LAB — BASIC METABOLIC PANEL WITH GFR
Anion gap: 16 — ABNORMAL HIGH (ref 5–15)
BUN: 14 mg/dL (ref 8–23)
CO2: 18 mmol/L — ABNORMAL LOW (ref 22–32)
Calcium: 10 mg/dL (ref 8.9–10.3)
Chloride: 105 mmol/L (ref 98–111)
Creatinine, Ser: 0.81 mg/dL (ref 0.61–1.24)
GFR, Estimated: >60 mL/min (ref >60)
Glucose, Bld: 164 mg/dL — ABNORMAL HIGH (ref 70–99)
Potassium: 4.2 mmol/L (ref 3.5–5.1)
Sodium: 140 mmol/L (ref 135–145)

## 2024-03-28 LAB — GLUCOSE, CAPILLARY
Glucose-Capillary: 121 mg/dL — ABNORMAL HIGH (ref 70–99)
Glucose-Capillary: 163 mg/dL — ABNORMAL HIGH (ref 70–99)
Glucose-Capillary: 151 mg/dL — ABNORMAL HIGH (ref 70–99)
Glucose-Capillary: 164 mg/dL — ABNORMAL HIGH (ref 70–99)

## 2024-03-28 LAB — CBC
HCT: 31.7 % — ABNORMAL LOW (ref 39.0–52.0)
Hemoglobin: 10.1 g/dL — ABNORMAL LOW (ref 13.0–17.0)
MCH: 27.3 pg (ref 26.0–34.0)
MCHC: 31.9 g/dL (ref 30.0–36.0)
MCV: 85.7 fL (ref 80.0–100.0)
Platelets: 646 K/uL — ABNORMAL HIGH (ref 150–400)
RBC: 3.7 MIL/uL — ABNORMAL LOW (ref 4.22–5.81)
RDW: 18.2 % — ABNORMAL HIGH (ref 11.5–15.5)
WBC: 14 K/uL — ABNORMAL HIGH (ref 4.0–10.5)
nRBC: 0 % (ref 0.0–0.2)

## 2024-03-28 NOTE — Progress Notes (Signed)
 Patient's visitor brought small dog in to visit patient. Dog was sitting at the end of the bed on top of patient's feet and barking/growling each time staff entered the room, a doggie pee pad had also been placed on the floor behind the door. After confirming with the [facility], advised patient that dog could not spend the night and the pee pad was not allowed to be used in the hospital for infection prevention reasons (dog must be toileted outside while visiting). Visitor finally left with dog around 2300 after again confirming hours of visitation.

## 2024-03-28 NOTE — Progress Notes (Signed)
 Physical Therapy Treatment Patient Details Name: Phillip Turner MRN: [id number] DOB: 1961/04/04 Today's Date: 03/28/2024   History of Present Illness Phillip Turner is a 63 y.o. male who presented to the ED with left foot pain and edema that he states is similar to his prior acute gout episodes for the past 7 days has not responded to his colchicine  use. Admitted to [facility] for w/o Pain swelling on b/l ankles and foot x 2 wks and worsening  Suspected cellulitis of left foot> right  Chronic Gout on chronic colchicine ; ortho c/s reveals follow gout treatment   PMH: alcohol  abuse in remission, anxiety, arthritis, depression, type 2 diabetes, hyperlipidemia, hypertension, hypothyroidism, rheumatic fever as a teenager, sickle cell trait, history of CVA with mild residual right-sided weakness and occasional speech disturbance, history of cocaine  abuse in remission    PT Comments  Pt cooperative and progressing with mobility but requires increased time for most tasks.  Pt assisted from commode, up to sink for hand hygiene in standing, ambulated short distance to bedside, assisted to bed and positioned for comfort with LEs elevated.  Pt reports continued significant pain but pleased with progress.   If plan is discharge home, recommend the following: A lot of help with walking and/or transfers;A lot of help with bathing/dressing/bathroom;Assistance with cooking/housework;Assist for transportation;Help with stairs or ramp for entrance   Can travel by private vehicle     No  Equipment Recommendations  None recommended by PT    Recommendations for Other Services       Precautions / Restrictions Precautions Precautions: Fall Restrictions Weight Bearing Restrictions Per Provider Order: No     Mobility  Bed Mobility Overal bed mobility: Needs Assistance Bed Mobility: Sit to Supine     Supine to sit: HOB elevated, Contact guard, Supervision Sit to supine: Contact guard assist, Used rails    General bed mobility comments: Increased time, use of rails, HOB elevated    Transfers Overall transfer level: Needs assistance Equipment used: Rolling walker (2 wheels) Transfers: Sit to/from Stand Sit to Stand: Min assist           General transfer comment: cues for LE management and use of UEs to self assist. Physical assist to bring wt up and fwd and to balance in initial standing with RW    Ambulation/Gait Ambulation/Gait assistance: Min assist Gait Distance (Feet): 12 Feet Assistive device: Rolling walker (2 wheels) Gait Pattern/deviations: Step-to pattern, Decreased step length - right, Decreased step length - left, Shuffle, Trunk flexed Gait velocity: decr     General Gait Details: Increased time with cues for sequence, posture and position from [employer]             Wheelchair Mobility     Tilt Bed    Modified Rankin (Stroke Patients Only)       Balance Overall balance assessment: Needs assistance Sitting-balance support: Feet supported, No upper extremity supported Sitting balance-Leahy Scale: Fair     Standing balance support: Bilateral upper extremity supported Standing balance-Leahy Scale: Poor                              Communication Communication Communication: No apparent difficulties  Cognition Arousal: Alert Behavior During Therapy: Anxious                             Following commands: Intact  Cueing Cueing Techniques: Verbal cues  Exercises      General Comments        Pertinent Vitals/Pain Pain Assessment Pain Assessment: 0-10 Pain Score: 8  Pain Location: B LE's L>R Pain Descriptors / Indicators: Aching, Burning, Constant, Tingling, Throbbing, Tender Pain Intervention(s): Limited activity within patient's tolerance, Monitored during session, Premedicated before session    Home Living Family/patient expects to be discharged to:: Private residence Living Arrangements:  Alone Available Help at Discharge: Other (Comment) Type of Home: House Home Access: Level entry       Home Layout: One level Home Equipment: Tub bench;Rolling Walker (2 wheels);Hand held shower head Additional Comments: guarded in responses but reports he is totally alone and does everything for himself and decreased frustration tolerance when questioned in more detail    Prior Function            PT Goals (current goals can now be found in the care plan section) Acute Rehab PT Goals Patient Stated Goal: Regain IND PT Goal Formulation: With patient Time For Goal Achievement: 04/11/24 Potential to Achieve Goals: Good Progress towards PT goals: Progressing toward goals    Frequency    Min 2X/week      PT Plan      Co-evaluation              AM-PAC PT 6 Clicks Mobility   Outcome Measure  Help needed turning from your back to your side while in a flat bed without using bedrails?: A Little Help needed moving from lying on your back to sitting on the side of a flat bed without using bedrails?: A Little Help needed moving to and from a bed to a chair (including a wheelchair)?: A Lot Help needed standing up from a chair using your arms (e.g., wheelchair or bedside chair)?: A Lot Help needed to walk in hospital room?: A Lot Help needed climbing 3-5 steps with a railing? : Total 6 Click Score: 13    End of Session Equipment Utilized During Treatment: Gait belt Activity Tolerance: Patient tolerated treatment well;Patient limited by pain Patient left: in bed;with call bell/phone within reach;with bed alarm set Nurse Communication: Mobility status PT Visit Diagnosis: Difficulty in walking, not elsewhere classified (R26.2);Muscle weakness (generalized) (M62.81);Repeated falls (R29.6);Pain Pain - part of body: Ankle and joints of foot     Time: 8697-8674 PT Time Calculation (min) (ACUTE ONLY): 23 min  Charges:    $Gait Training: 8-22 mins $Therapeutic  Activity: 8-22 mins PT General Charges $$ ACUTE PT VISIT: 1 Visit                     [facility] PT Acute Rehabilitation Services Office [phone]    Jariyah Hackley 03/28/2024, 1:35 PM

## 2024-03-28 NOTE — Progress Notes (Signed)
 [facility]                                                                              Phillip Turner, is a 63 y.o. male, DOB - 1960-10-15, FMW:[id number] Admit date - 03/23/2024    Outpatient Primary MD for the patient is [name], MD  LOS - 5  days  Chief Complaint  Patient presents with   Gout       Brief summary   Patient is a 63 year old male with a history of alcohol  use in remission, anxiety, arthritis, depression, DM type II, HLD, HTN, hypothyroidism, rheumatic fever as a teenager, sickle cell trait, history of CVA with mild residual right-sided weakness, history of cocaine  use in remission, presented with left foot pain and edema, similar to prior acute gout episodes for last 2 weeks, had not responded to his colchicine  use.  left foot x-ray>>no fracture or dislocation.  Diffuse soft tissue swelling and subcutaneous edema that may represent cellulitis. CT chest without>>no acute cardiopulmonary disease. 2 adjacent 2 mm nodules over the right upper lobe.  No follow-up negative if the patient is low risk ((and has no known more suspected primary neoplasm).  If high risk noncontrast chest CT in 12 months.Moderate cholelithiasis.1.7 cm left upper pole kidney lesion that is likely a cyst.  Initially placed on IV antibiotics, with no significant improvement.  Orthopedics was consulted.  MRI left foot and ankle-see report patient is versus erosive changes tendinitis calcification soft tissue edema and other findings of gout, recommended to continue current management.   Assessment & Plan    Pain swelling on b/l ankles and feet Initially suspected cellulitis of left foot> right Acute on chronic Gout on bilateral ankle/foot- PTA on chronic colchicine : - Left foot/ankle pain and swelling worse than the right and progressively worsening, despite taking home colchicine  bid -Initially started on IV antibiotics, adjusted to Rocephin  and vancomycin  with no  significant improvement. -Blood cultures NTD, venous Dopplers negative for DVT  - ESR, CRP elevated.  CT finding concerning for tendinitis calcification-changes of chronic gout. -Orthopedics consulted, Dr Barton recommended gout management. -  s/p MRI left foot and ankle-see report patient is versus erosive changes tendinitis calcification soft tissue edema and other findings of gout.  - Started on IV Solu-Medrol , patient reports improvement however still has intermittent 9/10 pain and unable to bear weight.  - Continue PT OT, colchicine , pain control - Uric acid 6.8, states allopurinol  makes his leg swell.  Recommend to follow-up with rheumatology after discharge.     Chronic back pain-Lumbar stenosis without neurogenic claudication - Continue Robaxin , pain control   Depression -Continue trazodone     Type 2 diabetes mellitus: A1c 5.6. -  holding home dulaglutide  CBG (last 3)  Recent Labs    03/27/24 1706 03/27/24 2206 03/28/24 0730  GLUCAP 156* 121* 121*   Continue sliding scale insulin  while inpatient.  Hypokalemia: Replace as needed   Hyperlipidemia Cont atorvastatin    Hypothyroidism Continue home levothyroxine  137 mcg p.o. daily.   HTN Stable. continue metoprolol  and hydralazine  prn.   Normocytic anemia Monitor hematocrit and hemoglobin.   Thrombocytosis In the setting of anemia  and acute infection.   Mild protein malnutrition, albumin  3.0 In the setting of acute infection.    2 mm nodules over the right upper lobe.: No follow-up negative if the patient is low risk (and has no known more suspected primary neoplasm). If high risk noncontrast chest CT in 12 months.-Patient needs to be instructed before discharge    Moderate cholelithiasis: incidentally noticed    1.7 cm left upper pole kidney lesion that is likely a cyst: Ultrasound imaging recommended for further evaluation once stable  Class II obesity Estimated body mass index is 36.22 kg/m as  calculated from the following:   Height as of this encounter: 5' 10 (1.778 m).   Weight as of this encounter: 114.5 kg.  Code Status: Full CODE STATUS DVT Prophylaxis:  Lovenox    Level of Care: Level of care: Med-Surg Family Communication: Updated patient Disposition Plan:      Remains inpatient appropriate: States lives in independent living facility, PT recommending SNF.  DC when bed available   Procedures:    Consultants:   Orthopedics  Antimicrobials:   Anti-infectives (From admission, onward)    Start     Dose/Rate Route Frequency Ordered Stop   03/27/24 1300  cefTRIAXone  (ROCEPHIN ) 2 g in sodium chloride  0.9 % 100 mL IVPB        2 g 200 mL/hr over 30 Minutes Intravenous Every 24 hours 03/27/24 1122 03/29/24 1259   03/26/24 0600  vancomycin  (VANCOREADY) IVPB 1250 mg/250 mL  Status:  Discontinued        1,250 mg 166.7 mL/hr over 90 Minutes Intravenous Every 12 hours 03/25/24 1821 03/27/24 1121   03/25/24 1600  vancomycin  (VANCOREADY) IVPB 2000 mg/400 mL        2,000 mg 200 mL/hr over 120 Minutes Intravenous  Once 03/25/24 1514 03/25/24 1800   03/24/24 1300  cefTRIAXone  (ROCEPHIN ) 2 g in sodium chloride  0.9 % 100 mL IVPB  Status:  Discontinued        2 g 200 mL/hr over 30 Minutes Intravenous Every 24 hours 03/23/24 1431 03/27/24 1122   03/23/24 1300  cefTRIAXone  (ROCEPHIN ) 2 g in sodium chloride  0.9 % 100 mL IVPB        2 g 200 mL/hr over 30 Minutes Intravenous  Once 03/23/24 1250 03/23/24 1434          Medications  aspirin  EC  325 mg Oral Daily   atorvastatin   10 mg Oral QHS   colchicine   0.6 mg Oral BID   empagliflozin   25 mg Oral Daily   enoxaparin  (LOVENOX ) injection  60 mg Subcutaneous Q24H   feeding supplement  237 mL Oral BID BM   folic acid   1 mg Oral Daily   insulin  aspart  0-6 Units Subcutaneous TID WC   levothyroxine   137 mcg Oral QAC breakfast   methylPREDNISolone  (SOLU-MEDROL ) injection  60 mg Intravenous Q12H   metoprolol  succinate  25 mg  Oral BID   ondansetron   8 mg Oral Once   traZODone   200 mg Oral QHS      Subjective:   Phillip Turner was seen and examined today.  States still has intermittent bilateral feet pain and 9/10.  Overall starting to improve.  No fevers or chills, chest pain, dizziness, lightheadedness nausea or vomiting.    Objective:   Vitals:   03/27/24 0920 03/27/24 1406 03/27/24 2207 03/28/24 0559  BP: (!) 150/85 (!) 142/85 139/86 128/74  Pulse: 70 74 67 (!) 59  Resp:  17 18 15  Temp:  97.7 F (36.5 C) 98 F (36.7 C) 98.8 F (37.1 C)  TempSrc:   Oral   SpO2:  100% 99% 98%  Weight:      Height:        Intake/Output Summary (Last 24 hours) at 03/28/2024 0921 Last data filed at 03/27/2024 2200 Gross per 24 hour  Intake 420.16 ml  Output 300 ml  Net 120.16 ml     Wt Readings from Last 3 Encounters:  03/23/24 114.5 kg  02/07/19 113 kg  11/27/18 113.4 kg     Exam General: Alert and oriented x 3, NAD Cardiovascular: S1 S2 auscultated,  RRR Respiratory: Clear to auscultation bilaterally, no wheezing Gastrointestinal: Soft, nontender, nondistended, + bowel sounds Ext: Bilateral feet/ankle swollen and tender, L>R Neuro: Strength 5/5 upper and lower extremities bilaterally Psych: Normal affect     Data Reviewed:  I have personally reviewed following labs    CBC Lab Results  Component Value Date   WBC 14.0 (H) 03/28/2024   RBC 3.70 (L) 03/28/2024   HGB 10.1 (L) 03/28/2024   HCT 31.7 (L) 03/28/2024   MCV 85.7 03/28/2024   MCH 27.3 03/28/2024   PLT 646 (H) 03/28/2024   MCHC 31.9 03/28/2024   RDW 18.2 (H) 03/28/2024   LYMPHSABS 2.2 11/28/2017   MONOABS 1.2 (H) 11/28/2017   EOSABS 0.2 11/28/2017   BASOSABS 0.0 11/28/2017     Last metabolic panel Lab Results  Component Value Date   NA 140 03/28/2024   K 4.2 03/28/2024   CL 105 03/28/2024   CO2 18 (L) 03/28/2024   BUN 14 03/28/2024   CREATININE 0.81 03/28/2024   GLUCOSE 164 (H) 03/28/2024   GFRNONAA >60  03/28/2024   GFRAA >60 11/24/2018   CALCIUM  10.0 03/28/2024   PROT 6.5 03/24/2024   ALBUMIN  3.0 (L) 03/24/2024   BILITOT 0.9 03/24/2024   ALKPHOS 108 03/24/2024   AST 31 03/24/2024   ALT 24 03/24/2024   ANIONGAP 16 (H) 03/28/2024    CBG (last 3)  Recent Labs    03/27/24 1706 03/27/24 2206 03/28/24 0730  GLUCAP 156* 121* 121*      Coagulation Profile: No results for input(s): INR, PROTIME in the last 168 hours.   Radiology Studies: I have personally reviewed the imaging studies  MR FOOT LEFT W WO CONTRAST Result Date: 03/26/2024 CLINICAL DATA:  Left foot and ankle pain and swelling. History of gout. Cellulitis. EXAM: MRI OF THE LEFT FOREFOOT WITHOUT AND WITH CONTRAST; MRI OF THE LEFT ANKLE WITHOUT AND WITH CONTRAST TECHNIQUE: Multiplanar, multisequence MR imaging of the left ankle and foot was performed both before and after administration of intravenous contrast. CONTRAST:  10mL GADAVIST  GADOBUTROL  1 MMOL/ML IV SOLN COMPARISON:  CT left ankle dated 03/25/2024. FINDINGS: Bones/Joint/Cartilage No acute fracture or dislocation. Osseous erosions at the medial and lateral malleoli, subtalar joints, dorsal midfoot, Lisfranc joint, and juxta-articular base of the fourth metatarsal, with adjacent amorphous calcification along the level of the distal tibiofibular syndesmosis, structures of the lateral ligamentous complex, and deltoid ligament. There is associated patchy periarticular and juxta-articular edema and enhancement. Osteonecrosis within the cuboid with surrounding marrow edema. There is a 3.1 x 2.8 x 2.8 cm multiseptated cystic structure with heterogenous T2 signal, foci of low signal intensity, and a thin enhancing low signal intensity rim, which corresponds to the larger cluster of calcifications below the base of the first metatarsal noted on the prior CT of the left ankle (series 14, image 17 and series 12,  image 17). Periarticular soft tissue edema and enhancement at the  midfoot. Suspected osteonecrosis within the fourth metatarsal shaft. Periarticular erosive changes of the first metatarsal head with corresponding amorphous calcifications and chondrocalcinosis along the ligamentous structures of the first MTP joint. No significant joint effusion. Ligaments Amorphous calcification along the level of the distal tibiofibular syndesmosis, structures of the lateral ligamentous complex, and deltoid ligament. Lisfranc ligament appears intact. Loss of fat signal within the sinus tarsi. Muscles and Tendons Tenosynovitis of the common peroneal tendon sheath extending from above the level of the lateral malleolus through the insertions distally. Mild tendinosis and tenosynovitis of the posterior tibial tendon. Tendinosis and tenosynovitis of the extensor digitorum tendon at the ankle. Achilles tendon and plantar fascia are intact. Soft tissue Circumferential subcutaneous edema extending from the ankle through the dorsal forefoot. A 9 x 9 x 6 mm T2 hyperintense peripherally enhancing focus within the plantar subcutaneous tissues overlying the fifth metatarsal head (series 9, image 22 and series 7, image 27), could reflect a ganglion cyst, although, the sterility of this fluid remains indeterminate. IMPRESSION: 1. Osseous erosive changes with calcific tendinitis and amorphous calcifications involving the ankle, midfoot, and first metatarsal head, as described above. There is associated periarticular marrow and soft tissue edema and enhancement of the midfoot and ankle. A 3.1 x 2.8 x 2.8 cm multiseptated heterogenous cystic structure with foci of low signal intensity and a thin enhancing low signal intensity rim corresponds to the larger cluster of calcifications below the base of the first metatarsal noted on the prior CT of the left ankle. These findings could reflect a combination of gout and/or tumoral calcinosis/calcific periarthritis. Superimposed acute infection can not be entirely  excluded. 2. A 9 mm peripherally enhancing fluid intensity focus within the plantar subcutaneous tissues overlying the fifth metatarsal head could reflect a ganglion cyst, although, the sterility of this fluid remains indeterminate. 3. Tenosynovitis of the common peroneal tendon sheath extending from above the level of the lateral malleolus through the insertions distally. Mild tendinosis and tenosynovitis of the posterior tibial tendon. Tendinosis and tenosynovitis of the extensor digitorum tendon. 4. Amorphous calcification along the level of the distal tibiofibular syndesmosis, structures of the lateral ligamentous complex, and deltoid ligament. Lisfranc ligament appears intact. Loss of fat signal within the sinus tarsi. 5. Subcutaneous edema extending from the ankle through the dorsal foot. Electronically Signed   By: Harrietta Sherry M.D.   On: 03/26/2024 11:52   MR ANKLE LEFT W WO CONTRAST Result Date: 03/26/2024 CLINICAL DATA:  Left foot and ankle pain and swelling. History of gout. Cellulitis. EXAM: MRI OF THE LEFT FOREFOOT WITHOUT AND WITH CONTRAST; MRI OF THE LEFT ANKLE WITHOUT AND WITH CONTRAST TECHNIQUE: Multiplanar, multisequence MR imaging of the left ankle and foot was performed both before and after administration of intravenous contrast. CONTRAST:  10mL GADAVIST  GADOBUTROL  1 MMOL/ML IV SOLN COMPARISON:  CT left ankle dated 03/25/2024. FINDINGS: Bones/Joint/Cartilage No acute fracture or dislocation. Osseous erosions at the medial and lateral malleoli, subtalar joints, dorsal midfoot, Lisfranc joint, and juxta-articular base of the fourth metatarsal, with adjacent amorphous calcification along the level of the distal tibiofibular syndesmosis, structures of the lateral ligamentous complex, and deltoid ligament. There is associated patchy periarticular and juxta-articular edema and enhancement. Osteonecrosis within the cuboid with surrounding marrow edema. There is a 3.1 x 2.8 x 2.8 cm  multiseptated cystic structure with heterogenous T2 signal, foci of low signal intensity, and a thin enhancing low signal intensity rim, which corresponds to  the larger cluster of calcifications below the base of the first metatarsal noted on the prior CT of the left ankle (series 14, image 17 and series 12, image 17). Periarticular soft tissue edema and enhancement at the midfoot. Suspected osteonecrosis within the fourth metatarsal shaft. Periarticular erosive changes of the first metatarsal head with corresponding amorphous calcifications and chondrocalcinosis along the ligamentous structures of the first MTP joint. No significant joint effusion. Ligaments Amorphous calcification along the level of the distal tibiofibular syndesmosis, structures of the lateral ligamentous complex, and deltoid ligament. Lisfranc ligament appears intact. Loss of fat signal within the sinus tarsi. Muscles and Tendons Tenosynovitis of the common peroneal tendon sheath extending from above the level of the lateral malleolus through the insertions distally. Mild tendinosis and tenosynovitis of the posterior tibial tendon. Tendinosis and tenosynovitis of the extensor digitorum tendon at the ankle. Achilles tendon and plantar fascia are intact. Soft tissue Circumferential subcutaneous edema extending from the ankle through the dorsal forefoot. A 9 x 9 x 6 mm T2 hyperintense peripherally enhancing focus within the plantar subcutaneous tissues overlying the fifth metatarsal head (series 9, image 22 and series 7, image 27), could reflect a ganglion cyst, although, the sterility of this fluid remains indeterminate. IMPRESSION: 1. Osseous erosive changes with calcific tendinitis and amorphous calcifications involving the ankle, midfoot, and first metatarsal head, as described above. There is associated periarticular marrow and soft tissue edema and enhancement of the midfoot and ankle. A 3.1 x 2.8 x 2.8 cm multiseptated heterogenous cystic  structure with foci of low signal intensity and a thin enhancing low signal intensity rim corresponds to the larger cluster of calcifications below the base of the first metatarsal noted on the prior CT of the left ankle. These findings could reflect a combination of gout and/or tumoral calcinosis/calcific periarthritis. Superimposed acute infection can not be entirely excluded. 2. A 9 mm peripherally enhancing fluid intensity focus within the plantar subcutaneous tissues overlying the fifth metatarsal head could reflect a ganglion cyst, although, the sterility of this fluid remains indeterminate. 3. Tenosynovitis of the common peroneal tendon sheath extending from above the level of the lateral malleolus through the insertions distally. Mild tendinosis and tenosynovitis of the posterior tibial tendon. Tendinosis and tenosynovitis of the extensor digitorum tendon. 4. Amorphous calcification along the level of the distal tibiofibular syndesmosis, structures of the lateral ligamentous complex, and deltoid ligament. Lisfranc ligament appears intact. Loss of fat signal within the sinus tarsi. 5. Subcutaneous edema extending from the ankle through the dorsal foot. Electronically Signed   By: Harrietta Sherry M.D.   On: 03/26/2024 11:52       Ingris Pasquarella M.D. [facility] 03/28/2024, 9:21 AM  Available via Epic secure chat 7am-7pm After 7 pm, please refer to night coverage provider listed on amion.

## 2024-03-28 NOTE — Evaluation (Signed)
 Physical Therapy Evaluation Patient Details Name: MARGARET STAGGS MRN: [id number] DOB: [birth year] Today's Date: 03/28/2024  History of Present Illness  Derril R Leeper is a 63 y.o. male who presented to the ED with left foot pain and edema that he states is similar to his prior acute gout episodes for the past 7 days has not responded to his colchicine  use. Admitted to [facility] for w/o Pain swelling on b/l ankles and foot x 2 wks and worsening  Suspected cellulitis of left foot> right  Chronic Gout on chronic colchicine ; ortho c/s reveals follow gout treatment   PMH: alcohol  abuse in remission, anxiety, arthritis, depression, type 2 diabetes, hyperlipidemia, hypertension, hypothyroidism, rheumatic fever as a teenager, sickle cell trait, history of CVA with mild residual right-sided weakness and occasional speech disturbance, history of cocaine  abuse in remission  Clinical Impression  Pt admitted as above and presenting with functional mobility limitations 2* generalized weakness/deconditioning, decreased activity tolerance, ambulatory balance deficits and significant bil foot pain exacerbated with any WB.  Patient will benefit from continued inpatient follow up therapy, <3 hours/day to maximize IND and safety prior to return home with limited assist.       If plan is discharge home, recommend the following: A lot of help with walking and/or transfers;A lot of help with bathing/dressing/bathroom;Assistance with cooking/housework;Assist for transportation;Help with stairs or ramp for entrance   Can travel by private vehicle   No    Equipment Recommendations None recommended by PT  Recommendations for Other Services       Functional Status Assessment Patient has had a recent decline in their functional status and demonstrates the ability to make significant improvements in function in a reasonable and predictable amount of time.     Precautions / Restrictions Precautions Precautions:  Fall Restrictions Weight Bearing Restrictions Per Provider Order: No      Mobility  Bed Mobility Overal bed mobility: Needs Assistance Bed Mobility: Supine to Sit     Supine to sit: HOB elevated, Contact guard, Supervision     General bed mobility comments: Increased time, use of rails, HOB elevated    Transfers Overall transfer level: Needs assistance Equipment used: Rolling walker (2 wheels) Transfers: Sit to/from Stand Sit to Stand: Min assist, +2 physical assistance, +2 safety/equipment, From elevated surface           General transfer comment: cues for LE management and use of UEs to self assist. Physical assist to bring wt up and fwd and to balance in initial standing with RW    Ambulation/Gait Ambulation/Gait assistance: Min assist, +2 physical assistance, +2 safety/equipment Gait Distance (Feet): 12 Feet Assistive device: Rolling walker (2 wheels) Gait Pattern/deviations: Step-to pattern, Decreased step length - right, Decreased step length - left, Shuffle, Trunk flexed Gait velocity: decr     General Gait Details: Increased time with cues for sequence, posture and position from [employer]            Wheelchair Mobility     Tilt Bed    Modified Rankin (Stroke Patients Only)       Balance Overall balance assessment: Needs assistance Sitting-balance support: Feet supported, No upper extremity supported Sitting balance-Leahy Scale: Fair     Standing balance support: Bilateral upper extremity supported Standing balance-Leahy Scale: Poor                               Pertinent Vitals/Pain Pain Assessment Pain Assessment: 0-10  Pain Score: 8  Pain Location: B LE's L>R Pain Descriptors / Indicators: Aching, Burning, Constant, Tingling, Throbbing, Tender Pain Intervention(s): Limited activity within patient's tolerance, Monitored during session, Premedicated before session    Home Living Family/patient expects to be discharged  to:: Private residence Living Arrangements: Alone Available Help at Discharge: Other (Comment) Type of Home: House Home Access: Level entry       Home Layout: One level Home Equipment: Tub bench;Rolling Walker (2 wheels);Hand held shower head Additional Comments: guarded in responses but reports he is totally alone and does everything for himself and decreased frustration tolerance when questioned in more detail    Prior Function Prior Level of Function : Independent/Modified Independent             Mobility Comments: reports he has stopped usign any device and was indep incl community ? ADLs Comments: reports mod I for tub shower access all B/IADL's, groceries and driving     Extremity/Trunk Assessment   Upper Extremity Assessment Upper Extremity Assessment: Right hand dominant    Lower Extremity Assessment Lower Extremity Assessment: RLE deficits/detail;Generalized weakness;LLE deficits/detail RLE: Unable to fully assess due to pain LLE: Unable to fully assess due to pain    Cervical / Trunk Assessment Cervical / Trunk Assessment: Lordotic;Other exceptions  Communication   Communication Communication: No apparent difficulties    Cognition Arousal: Alert Behavior During Therapy: Lability                             Following commands: Intact       Cueing Cueing Techniques: Verbal cues     General Comments      Exercises     Assessment/Plan    PT Assessment Patient needs continued PT services  PT Problem List Decreased strength;Decreased range of motion;Decreased activity tolerance;Decreased balance;Decreased mobility;Decreased knowledge of use of DME;Obesity;Pain       PT Treatment Interventions DME instruction;Gait training;Functional mobility training;Therapeutic activities;Therapeutic exercise;Balance training;Patient/family education    PT Goals (Current goals can be found in the Care Plan section)  Acute Rehab PT Goals Patient  Stated Goal: Regain IND PT Goal Formulation: With patient Time For Goal Achievement: 04/11/24 Potential to Achieve Goals: Good    Frequency Min 2X/week     Co-evaluation               AM-PAC PT 6 Clicks Mobility  Outcome Measure Help needed turning from your back to your side while in a flat bed without using bedrails?: A Little Help needed moving from lying on your back to sitting on the side of a flat bed without using bedrails?: A Little Help needed moving to and from a bed to a chair (including a wheelchair)?: A Lot Help needed standing up from a chair using your arms (e.g., wheelchair or bedside chair)?: A Lot Help needed to walk in hospital room?: A Lot Help needed climbing 3-5 steps with a railing? : Total 6 Click Score: 13    End of Session Equipment Utilized During Treatment: Gait belt Activity Tolerance: Patient tolerated treatment well Patient left: Other (comment) (bathroom) Nurse Communication: Mobility status PT Visit Diagnosis: Difficulty in walking, not elsewhere classified (R26.2);Muscle weakness (generalized) (M62.81);Repeated falls (R29.6);Pain Pain - part of body: Ankle and joints of foot    Time: 1220-1248 PT Time Calculation (min) (ACUTE ONLY): 28 min   Charges:   PT Evaluation $PT Eval Low Complexity: 1 Low PT Treatments $Gait Training: 8-22 mins PT  General Charges $$ ACUTE PT VISIT: 1 Visit         [facility] PT Acute Rehabilitation Services Office [phone]   Rameen Quinney 03/28/2024, 12:58 PM

## 2024-03-28 NOTE — Plan of Care (Signed)
   Problem: Coping: Goal: Level of anxiety will decrease Outcome: Progressing   Problem: Pain Managment: Goal: General experience of comfort will improve and/or be controlled Outcome: Progressing   Problem: Safety: Goal: Ability to remain free from injury will improve Outcome: Progressing

## 2024-03-29 ENCOUNTER — Other Ambulatory Visit: Payer: Self-pay

## 2024-03-29 ENCOUNTER — Other Ambulatory Visit (HOSPITAL_COMMUNITY): Payer: Self-pay

## 2024-03-29 DIAGNOSIS — E66812 Obesity, class 2: Secondary | ICD-10-CM | POA: Diagnosis not present

## 2024-03-29 DIAGNOSIS — M109 Gout, unspecified: Secondary | ICD-10-CM | POA: Diagnosis present

## 2024-03-29 DIAGNOSIS — I10 Essential (primary) hypertension: Secondary | ICD-10-CM | POA: Diagnosis not present

## 2024-03-29 DIAGNOSIS — E038 Other specified hypothyroidism: Secondary | ICD-10-CM

## 2024-03-29 DIAGNOSIS — M7989 Other specified soft tissue disorders: Secondary | ICD-10-CM | POA: Diagnosis not present

## 2024-03-29 DIAGNOSIS — L03116 Cellulitis of left lower limb: Secondary | ICD-10-CM | POA: Diagnosis not present

## 2024-03-29 LAB — CULTURE, BLOOD (ROUTINE X 2)
Culture: NO GROWTH
Culture: NO GROWTH
Special Requests: ADEQUATE
Special Requests: ADEQUATE

## 2024-03-29 LAB — CBC
HCT: 30 % — ABNORMAL LOW (ref 39.0–52.0)
Hemoglobin: 9.8 g/dL — ABNORMAL LOW (ref 13.0–17.0)
MCH: 27.3 pg (ref 26.0–34.0)
MCHC: 32.7 g/dL (ref 30.0–36.0)
MCV: 83.6 fL (ref 80.0–100.0)
Platelets: 667 K/uL — ABNORMAL HIGH (ref 150–400)
RBC: 3.59 MIL/uL — ABNORMAL LOW (ref 4.22–5.81)
RDW: 18.1 % — ABNORMAL HIGH (ref 11.5–15.5)
WBC: 14.8 K/uL — ABNORMAL HIGH (ref 4.0–10.5)
nRBC: 0.1 % (ref 0.0–0.2)

## 2024-03-29 LAB — BASIC METABOLIC PANEL WITH GFR
Anion gap: 10 (ref 5–15)
BUN: 20 mg/dL (ref 8–23)
CO2: 22 mmol/L (ref 22–32)
Calcium: 9.7 mg/dL (ref 8.9–10.3)
Chloride: 106 mmol/L (ref 98–111)
Creatinine, Ser: 0.9 mg/dL (ref 0.61–1.24)
GFR, Estimated: >60 mL/min (ref >60)
Glucose, Bld: 178 mg/dL — ABNORMAL HIGH (ref 70–99)
Potassium: 3.5 mmol/L (ref 3.5–5.1)
Sodium: 139 mmol/L (ref 135–145)

## 2024-03-29 LAB — GLUCOSE, CAPILLARY
Glucose-Capillary: 155 mg/dL — ABNORMAL HIGH (ref 70–99)
Glucose-Capillary: 125 mg/dL — ABNORMAL HIGH (ref 70–99)
Glucose-Capillary: 157 mg/dL — ABNORMAL HIGH (ref 70–99)
Glucose-Capillary: 107 mg/dL — ABNORMAL HIGH (ref 70–99)

## 2024-03-29 MED ORDER — ENOXAPARIN SODIUM 60 MG/0.6ML IJ SOSY
55.0000 mg | PREFILLED_SYRINGE | INTRAMUSCULAR | Status: DC
  Administered 2024-03-29 – 2024-03-30 (×2): 55 mg via SUBCUTANEOUS
  Filled 2024-03-29 (×2): qty 0.6

## 2024-03-29 NOTE — Plan of Care (Signed)
  Problem: Education: Goal: Knowledge of General Education information will improve Description: Including pain rating scale, medication(s)/side effects and non-pharmacologic comfort measures Outcome: Progressing   Problem: Health Behavior/Discharge Planning: Goal: Ability to manage health-related needs will improve Outcome: Progressing   Problem: Clinical Measurements: Goal: Ability to maintain clinical measurements within normal limits will improve Outcome: Progressing   Problem: Activity: Goal: Risk for activity intolerance will decrease Outcome: Progressing   Problem: Nutrition: Goal: Adequate nutrition will be maintained Outcome: Progressing   Problem: Coping: Goal: Level of anxiety will decrease Outcome: Progressing   Problem: Pain Managment: Goal: General experience of comfort will improve and/or be controlled Outcome: Progressing   Problem: Elimination: Goal: Will not experience complications related to bowel motility Outcome: Progressing   Problem: Safety: Goal: Ability to remain free from injury will improve Outcome: Progressing   Problem: Skin Integrity: Goal: Risk for impaired skin integrity will decrease Outcome: Progressing

## 2024-03-29 NOTE — Plan of Care (Signed)
   Problem: Coping: Goal: Level of anxiety will decrease Outcome: Progressing   Problem: Pain Managment: Goal: General experience of comfort will improve and/or be controlled Outcome: Progressing   Problem: Safety: Goal: Ability to remain free from injury will improve Outcome: Progressing

## 2024-03-29 NOTE — Progress Notes (Signed)
 The following equipment is recommended by occupational therapy to increase pt's ability to perform ADL and decrease burden of care:   [facility]- patient has limited mobility to access standard toilet or bathroom, requires significant assist, pain and taxing effort with inability to access bathroom requiring BSC for toileting.   Geni OT/L Acute Rehabilitation Department  [phone]

## 2024-03-29 NOTE — Progress Notes (Signed)
 Occupational Therapy Treatment Patient Details Name: Phillip Turner MRN: [id number] DOB: [birth year] Today's Date: 03/29/2024   History of present illness Phillip Turner is a 63 y.o. male who presented to the ED with left foot pain and edema that he states is similar to his prior acute gout episodes for the past 7 days has not responded to his colchicine  use. Admitted to [facility] for w/o Pain swelling on b/l ankles and foot x 2 wks and worsening  Suspected cellulitis of left foot> right  Chronic Gout on chronic colchicine ; ortho c/s reveals follow gout treatment   PMH: alcohol  abuse in remission, anxiety, arthritis, depression, type 2 diabetes, hyperlipidemia, hypertension, hypothyroidism, rheumatic fever as a teenager, sickle cell trait, history of CVA with mild residual right-sided weakness and occasional speech disturbance, history of cocaine  abuse in remission   OT comments  Patient seen for skilled OT session this afternoon. Significant improvement in pain and overall activity tolerance for BADL's and basic functional mobility with RW. Patient reports he has discussed with his MD and will go home in 2 days and I do not want rehab. Based on short distance assisted hallway amb and overall increased function, OT recommending home with [facility] to access toilet. Patient requesting as well but is not amenable to follow up OT at this time. Patient requires continued Acute care hospital level OT services to progress safety and functional performance and allow for discharge.         If plan is discharge home, recommend the following:  A little help with walking and/or transfers;A lot of help with bathing/dressing/bathroom;Assistance with cooking/housework;Assist for transportation;Help with stairs or ramp for entrance   Equipment Recommendations  BSC/3in1       Precautions / Restrictions Precautions Precautions: Fall Restrictions Weight Bearing Restrictions Per Provider Order: No       Mobility  Bed Mobility Overal bed mobility: Needs Assistance Bed Mobility: Supine to Sit, Sit to Supine     Supine to sit: HOB elevated, Supervision Sit to supine: Used rails, Supervision   General bed mobility comments: teach back to use gait belt if needed    Transfers Overall transfer level: Needs assistance Equipment used: Rolling walker (2 wheels) Transfers: Sit to/from Stand, Bed to chair/wheelchair/BSC Sit to Stand: Contact guard assist     Step pivot transfers: Contact guard assist, From elevated surface     General transfer comment: improved for all ADL mobility     Balance Overall balance assessment: Needs assistance Sitting-balance support: Feet supported, No upper extremity supported Sitting balance-Leahy Scale: Fair     Standing balance support: Bilateral upper extremity supported Standing balance-Leahy Scale: Fair                             ADL either performed or assessed with clinical judgement   ADL Overall ADL's : Needs assistance/impaired Eating/Feeding: Modified independent   Grooming: Wash/dry hands;Wash/dry face;Oral care;Set up;Sitting   Upper Body Bathing: Set up;Sitting   Lower Body Bathing: Minimal assistance;Sit to/from stand   Upper Body Dressing : Set up;Sitting   Lower Body Dressing: Minimal assistance;Sit to/from stand   Toilet Transfer: Minimal assistance;BSC/3in1;Rolling walker (2 wheels) Toilet Transfer Details (indicate cue type and reason): elevated seat needs Toileting- Clothing Manipulation and Hygiene: Contact guard assist;Sit to/from stand       Functional mobility during ADLs: Contact guard assist General ADL Comments: significant improvement in overall tolerance this session    Extremity/Trunk Assessment Upper  Extremity Assessment Upper Extremity Assessment: Right hand dominant;Overall WFL for tasks assessed   Lower Extremity Assessment Lower Extremity Assessment: Defer to PT evaluation                  Communication Communication Communication: No apparent difficulties   Cognition Arousal: Alert Behavior During Therapy:  (low frustration tolerance) Cognition: No apparent impairments                               Following commands: Intact        Cueing   Cueing Techniques: Verbal cues        General Comments improved pain in B feet with improved overall activity tolerance    Pertinent Vitals/ Pain       Pain Assessment Pain Assessment: 0-10 Pain Score: 2  Pain Location: B LE's L>R Pain Descriptors / Indicators: Aching, Burning, Constant, Tingling, Throbbing, Tender Pain Intervention(s): Monitored during session, Premedicated before session, Repositioned   Frequency  Min 2X/week        Progress Toward Goals  OT Goals(current goals can now be found in the care plan section)  Progress towards OT goals: Progressing toward goals  Acute Rehab OT Goals Patient Stated Goal: to go home OT Goal Formulation: With patient Time For Goal Achievement: 04/10/24 Potential to Achieve Goals: Fair ADL Goals Pt Will Perform Lower Body Bathing: with contact guard assist;sit to/from stand;with adaptive equipment Pt Will Perform Lower Body Dressing: with min assist;sit to/from stand;with adaptive equipment Pt Will Transfer to Toilet: with contact guard assist;bedside commode;stand pivot transfer Pt Will Perform Toileting - Clothing Manipulation and hygiene: with set-up;sit to/from stand Pt Will Perform Tub/Shower Transfer: Tub transfer;Stand pivot transfer;tub bench;with min assist  Plan         AM-PAC OT 6 Clicks Daily Activity     Outcome Measure   Help from another person eating meals?: None Help from another person taking care of personal grooming?: None Help from another person toileting, which includes using toliet, bedpan, or urinal?: A Little Help from another person bathing (including washing, rinsing, drying)?: A Little Help from another person to put  on and taking off regular upper body clothing?: None Help from another person to put on and taking off regular lower body clothing?: A Little 6 Click Score: 21    End of Session Equipment Utilized During Treatment: Gait belt  OT Visit Diagnosis: Unsteadiness on feet (R26.81);Other abnormalities of gait and mobility (R26.89);Pain Pain - part of body: Ankle and joints of foot   Activity Tolerance Patient limited by pain   Patient Left in bed;with call bell/phone within reach;with bed alarm set;with nursing/sitter in room   Nurse Communication Mobility status        Time: 8479-8457 OT Time Calculation (min): 22 min  Charges: OT General Charges $OT Visit: 1 Visit OT Treatments $Therapeutic Activity: 8-22 mins  Jarick Harkins OT/L Acute Rehabilitation Department  [phone]  03/29/2024, 4:28 PM

## 2024-03-29 NOTE — TOC Progression Note (Signed)
 Transition of Care [name]) - Progression Note    Patient Details  Name: Phillip Turner MRN: [id number] Date of Birth: 1960/12/25  Transition of Care [facility]) CM/SW Contact  NORMAN ASPEN, LCSW Phone Number: 03/29/2024, 1:57 PM  Clinical Narrative:     Met with pt this morning to review therapy recommendations for SNF.  Pt immediately states he will not consider SNF and reports I plan on going home on Wednesday.  Have alerted MD/RN/therapists assigned to pt's resistance to SNF plan.  Will see how well he does today with therapies.  Expected Discharge Plan:  (TBD) Barriers to Discharge: Continued Medical Work up               Expected Discharge Plan and Services In-house Referral: Clinical Social Work     Living arrangements for the past 2 months: Single Family Home                                       Social Drivers of Health (SDOH) Interventions SDOH Screenings   Food Insecurity: No Food Insecurity (03/23/2024)  Housing: Low Risk  (03/23/2024)  Transportation Needs: No Transportation Needs (03/23/2024)  Utilities: Not At Risk (03/23/2024)  Tobacco Use: Medium Risk (03/23/2024)    Readmission Risk Interventions    03/26/2024    3:22 PM  Readmission Risk Prevention Plan  Transportation Screening Complete  PCP or Specialist Appt within 5-7 Days Complete  Home Care Screening Complete  Medication Review (RN CM) Complete

## 2024-03-29 NOTE — Progress Notes (Signed)
 [facility]  PROCEDURAL EXPEDITER PROGRESS NOTE  Patient Name: Phillip Turner  DOB:1960-12-11 Date of Admission: 03/23/2024  Date of Assessment:03/29/24   ------------------------------------------------------------------------------------------------------------------- Pt refuses to go to SNF, will se how he does tomorrow pt wants to go to his home on 03/31/2024 -------------------------------------------------------------------------------------------------------------------  [facility] [employer], Ronal DELENA Bald Please contact us  directly via secure chat (search for [facility]) or by calling us  at [phone] [facility]).

## 2024-03-29 NOTE — Progress Notes (Signed)
 [facility]                                                                              Gildo Brock, is a 63 y.o. male, DOB - Sep 14, 1960, FMW:[id number] Admit date - 03/23/2024    Outpatient Primary MD for the patient is [name], MD  LOS - 6  days  Chief Complaint  Patient presents with   Gout       Brief summary   Patient is a 63 year old male with a history of alcohol  use in remission, anxiety, arthritis, depression, DM type II, HLD, HTN, hypothyroidism, rheumatic fever as a teenager, sickle cell trait, history of CVA with mild residual right-sided weakness, history of cocaine  use in remission, presented with left foot pain and edema, similar to prior acute gout episodes for last 2 weeks, had not responded to his colchicine  use.  left foot x-ray>>no fracture or dislocation.  Diffuse soft tissue swelling and subcutaneous edema that may represent cellulitis. CT chest without>>no acute cardiopulmonary disease. 2 adjacent 2 mm nodules over the right upper lobe.  No follow-up negative if the patient is low risk ((and has no known more suspected primary neoplasm).  If high risk noncontrast chest CT in 12 months.Moderate cholelithiasis.1.7 cm left upper pole kidney lesion that is likely a cyst.  Initially placed on IV antibiotics, with no significant improvement.  Orthopedics was consulted.  MRI left foot and ankle-see report patient is versus erosive changes tendinitis calcification soft tissue edema and other findings of gout, recommended to continue current management.   Assessment & Plan    Pain swelling on b/l ankles and feet Initially suspected cellulitis of left foot> right Acute on chronic Gout on bilateral ankle/foot- PTA on chronic colchicine : - Left foot/ankle pain and swelling worse than the right and progressively worsening, despite taking home colchicine  bid -Initially started on IV antibiotics, adjusted to Rocephin  and vancomycin  with no  significant improvement. -Blood cultures NTD, venous Dopplers negative for DVT  - ESR, CRP elevated.  CT finding concerning for tendinitis calcification-changes of chronic gout. -Orthopedics consulted, Dr Barton recommended gout management. -  s/p MRI left foot and ankle-see report patient is versus erosive changes tendinitis calcification soft tissue edema and other findings of gout.  - Started on IV Solu-Medrol , reported improvement since starting Solu-Medrol .   - Continue PT OT, colchicine , pain control - Uric acid 6.8, states allopurinol  makes his leg swell.  Recommend to follow-up with rheumatology after discharge. -PT evaluation recommended SNF, however patient currently reluctant to go to SNF, would like to go home and continue to work with PT.     Chronic back pain-Lumbar stenosis without neurogenic claudication - Continue Robaxin , pain control   Depression -Continue trazodone     Type 2 diabetes mellitus: A1c 5.6. -  holding home dulaglutide  CBG (last 3)  Recent Labs    03/28/24 1641 03/28/24 2236 03/29/24 0717  GLUCAP 151* 164* 155*   Continue sliding scale insulin  while inpatient.  Hypokalemia: Replace as needed   Hyperlipidemia Cont atorvastatin    Hypothyroidism Continue home levothyroxine  137 mcg p.o. daily.   HTN Stable. continue metoprolol  and hydralazine  prn.  Normocytic anemia Monitor hematocrit and hemoglobin.   Thrombocytosis In the setting of anemia and acute infection.   Mild protein malnutrition, albumin  3.0 In the setting of acute infection.    2 mm nodules over the right upper lobe.: No follow-up negative if the patient is low risk (and has no known more suspected primary neoplasm). If high risk noncontrast chest CT in 12 months.-Patient needs to be instructed before discharge    Moderate cholelithiasis: incidentally noticed    1.7 cm left upper pole kidney lesion that is likely a cyst: Ultrasound imaging recommended for further  evaluation once stable  Class II obesity Estimated body mass index is 36.22 kg/m as calculated from the following:   Height as of this encounter: 5' 10 (1.778 m).   Weight as of this encounter: 114.5 kg.  Code Status: Full CODE STATUS DVT Prophylaxis:  Lovenox    Level of Care: Level of care: Med-Surg Family Communication: Updated patient Disposition Plan:      Remains inpatient appropriate: PT recommending SNF however patient currently reluctant to go to SNF.   Procedures:    Consultants:   Orthopedics  Antimicrobials:   Anti-infectives (From admission, onward)    Start     Dose/Rate Route Frequency Ordered Stop   03/27/24 1300  cefTRIAXone  (ROCEPHIN ) 2 g in sodium chloride  0.9 % 100 mL IVPB        2 g 200 mL/hr over 30 Minutes Intravenous Every 24 hours 03/27/24 1122 03/28/24 1353   03/26/24 0600  vancomycin  (VANCOREADY) IVPB 1250 mg/250 mL  Status:  Discontinued        1,250 mg 166.7 mL/hr over 90 Minutes Intravenous Every 12 hours 03/25/24 1821 03/27/24 1121   03/25/24 1600  vancomycin  (VANCOREADY) IVPB 2000 mg/400 mL        2,000 mg 200 mL/hr over 120 Minutes Intravenous  Once 03/25/24 1514 03/25/24 1800   03/24/24 1300  cefTRIAXone  (ROCEPHIN ) 2 g in sodium chloride  0.9 % 100 mL IVPB  Status:  Discontinued        2 g 200 mL/hr over 30 Minutes Intravenous Every 24 hours 03/23/24 1431 03/27/24 1122   03/23/24 1300  cefTRIAXone  (ROCEPHIN ) 2 g in sodium chloride  0.9 % 100 mL IVPB        2 g 200 mL/hr over 30 Minutes Intravenous  Once 03/23/24 1250 03/23/24 1434          Medications  aspirin  EC  325 mg Oral Daily   atorvastatin   10 mg Oral QHS   colchicine   0.6 mg Oral BID   empagliflozin   25 mg Oral Daily   enoxaparin  (LOVENOX ) injection  55 mg Subcutaneous Q24H   feeding supplement  237 mL Oral BID BM   folic acid   1 mg Oral Daily   insulin  aspart  0-6 Units Subcutaneous TID WC   levothyroxine   137 mcg Oral QAC breakfast   methylPREDNISolone  (SOLU-MEDROL )  injection  60 mg Intravenous Q12H   metoprolol  succinate  25 mg Oral BID   ondansetron   8 mg Oral Once   traZODone   200 mg Oral QHS      Subjective:   Phillip Turner was seen and examined today.  Overall improving, pain in the feet is getting better.  States want to work with PT and eventually go home.  Declined SNF.    Objective:   Vitals:   03/28/24 0559 03/28/24 1317 03/28/24 2234 03/29/24 0547  BP: 128/74 132/81 (!) 142/83 (!) 146/93  Pulse: (!) 59 84 66  66  Resp: 15 17 18 18   Temp: 98.8 F (37.1 C)  98 F (36.7 C) (!) 97.4 F (36.3 C)  TempSrc:   Oral Oral  SpO2: 98% 98% 94% 100%  Weight:      Height:        Intake/Output Summary (Last 24 hours) at 03/29/2024 1116 Last data filed at 03/29/2024 9077 Gross per 24 hour  Intake 720 ml  Output 1825 ml  Net -1105 ml     Wt Readings from Last 3 Encounters:  03/23/24 114.5 kg  02/07/19 113 kg  11/27/18 113.4 kg    Physical Exam General: Alert and oriented x 3, NAD Cardiovascular: S1 S2 clear, RRR.  Respiratory: CTAB, no wheezing Gastrointestinal: Soft, nontender, nondistended, NBS Ext: Bilateral pedal edema Neuro: no new deficits Psych: Normal affect     Data Reviewed:  I have personally reviewed following labs    CBC Lab Results  Component Value Date   WBC 14.8 (H) 03/29/2024   RBC 3.59 (L) 03/29/2024   HGB 9.8 (L) 03/29/2024   HCT 30.0 (L) 03/29/2024   MCV 83.6 03/29/2024   MCH 27.3 03/29/2024   PLT 667 (H) 03/29/2024   MCHC 32.7 03/29/2024   RDW 18.1 (H) 03/29/2024   LYMPHSABS 2.2 11/28/2017   MONOABS 1.2 (H) 11/28/2017   EOSABS 0.2 11/28/2017   BASOSABS 0.0 11/28/2017     Last metabolic panel Lab Results  Component Value Date   NA 139 03/29/2024   K 3.5 03/29/2024   CL 106 03/29/2024   CO2 22 03/29/2024   BUN 20 03/29/2024   CREATININE 0.90 03/29/2024   GLUCOSE 178 (H) 03/29/2024   GFRNONAA >60 03/29/2024   GFRAA >60 11/24/2018   CALCIUM  9.7 03/29/2024   PROT 6.5 03/24/2024    ALBUMIN  3.0 (L) 03/24/2024   BILITOT 0.9 03/24/2024   ALKPHOS 108 03/24/2024   AST 31 03/24/2024   ALT 24 03/24/2024   ANIONGAP 10 03/29/2024    CBG (last 3)  Recent Labs    03/28/24 1641 03/28/24 2236 03/29/24 0717  GLUCAP 151* 164* 155*      Coagulation Profile: No results for input(s): INR, PROTIME in the last 168 hours.   Radiology Studies: I have personally reviewed the imaging studies  No results found.      Nydia Distance M.D. [facility] 03/29/2024, 11:16 AM  Available via Epic secure chat 7am-7pm After 7 pm, please refer to night coverage provider listed on amion.

## 2024-03-30 DIAGNOSIS — M1000 Idiopathic gout, unspecified site: Secondary | ICD-10-CM | POA: Diagnosis not present

## 2024-03-30 DIAGNOSIS — E441 Mild protein-calorie malnutrition: Secondary | ICD-10-CM

## 2024-03-30 DIAGNOSIS — L03116 Cellulitis of left lower limb: Secondary | ICD-10-CM | POA: Diagnosis not present

## 2024-03-30 DIAGNOSIS — M7989 Other specified soft tissue disorders: Secondary | ICD-10-CM | POA: Diagnosis not present

## 2024-03-30 LAB — BASIC METABOLIC PANEL WITH GFR
Anion gap: 10 (ref 5–15)
BUN: 21 mg/dL (ref 8–23)
CO2: 22 mmol/L (ref 22–32)
Calcium: 9.2 mg/dL (ref 8.9–10.3)
Chloride: 107 mmol/L (ref 98–111)
Creatinine, Ser: 0.87 mg/dL (ref 0.61–1.24)
GFR, Estimated: >60 mL/min (ref >60)
Glucose, Bld: 181 mg/dL — ABNORMAL HIGH (ref 70–99)
Potassium: 3.8 mmol/L (ref 3.5–5.1)
Sodium: 139 mmol/L (ref 135–145)

## 2024-03-30 LAB — CBC
HCT: 30 % — ABNORMAL LOW (ref 39.0–52.0)
Hemoglobin: 9.5 g/dL — ABNORMAL LOW (ref 13.0–17.0)
MCH: 26.8 pg (ref 26.0–34.0)
MCHC: 31.7 g/dL (ref 30.0–36.0)
MCV: 84.5 fL (ref 80.0–100.0)
Platelets: 681 K/uL — ABNORMAL HIGH (ref 150–400)
RBC: 3.55 MIL/uL — ABNORMAL LOW (ref 4.22–5.81)
RDW: 18 % — ABNORMAL HIGH (ref 11.5–15.5)
WBC: 11.9 K/uL — ABNORMAL HIGH (ref 4.0–10.5)
nRBC: 0.3 % — ABNORMAL HIGH (ref 0.0–0.2)

## 2024-03-30 LAB — GLUCOSE, CAPILLARY
Glucose-Capillary: 158 mg/dL — ABNORMAL HIGH (ref 70–99)
Glucose-Capillary: 207 mg/dL — ABNORMAL HIGH (ref 70–99)
Glucose-Capillary: 221 mg/dL — ABNORMAL HIGH (ref 70–99)
Glucose-Capillary: 127 mg/dL — ABNORMAL HIGH (ref 70–99)

## 2024-03-30 MED ORDER — GLIMEPIRIDE 1 MG PO TABS: 1.0000 mg | ORAL_TABLET | Freq: Every day | ORAL | Status: DC

## 2024-03-30 NOTE — TOC Transition Note (Signed)
 Transition of Care [facility]) - Discharge Note   Patient Details  Name: Phillip Turner MRN: [id number] Date of Birth: 02-20-1961  Transition of Care [facility]) CM/SW Contact:  NORMAN ASPEN, LCSW Phone Number: 03/30/2024, 12:38 PM   Clinical Narrative:     Met with pt today to review updated recommendations from therapy given progress patient has had with mobility and ADLs.  He is agreeable with plan now for home dc.  He is aware that HHPT is recommended, however, he has declined this f/u.  He is agreeable with recommended DME of RW and bedside and no DME agency preference.  Orders placed with [facility] for delivery to pt's room prior to dc.  Anticipating dc home tomorrow if medically cleared.  No further IP CM needs.  Final next level of care: Home/Self Care Barriers to Discharge: Barriers Resolved   Patient Goals and CMS Choice Patient states their goals for this hospitalization and ongoing recovery are:: return home          Discharge Placement                       Discharge Plan and Services Additional resources added to the After Visit Summary for   In-house Referral: Clinical Social Work              DME Arranged: 3-N-1, Vannie rolling DME Agency: [facility] Date DME Agency Contacted: 03/30/24 Time DME Agency Contacted: 1238 Representative spoke with at DME Agency: Darlyn            Social Drivers of Health (SDOH) Interventions SDOH Screenings   Food Insecurity: No Food Insecurity (03/23/2024)  Housing: Low Risk  (03/23/2024)  Transportation Needs: No Transportation Needs (03/23/2024)  Utilities: Not At Risk (03/23/2024)  Tobacco Use: Medium Risk (03/23/2024)     Readmission Risk Interventions    03/26/2024    3:22 PM  Readmission Risk Prevention Plan  Transportation Screening Complete  PCP or Specialist Appt within 5-7 Days Complete  Home Care Screening Complete  Medication Review (RN CM) Complete

## 2024-03-30 NOTE — Progress Notes (Signed)
 Physical Therapy Treatment Patient Details Name: Phillip Turner MRN: [id number] DOB: July 25, 1960 Today's Date: 03/30/2024   History of Present Illness Phillip Turner is a 63 y.o. male who presented to the ED with left foot pain and edema that he states is similar to his prior acute gout episodes for the past 7 days has not responded to his colchicine  use. Admitted to [facility] for w/o Pain swelling on b/l ankles and foot x 2 wks and worsening  Suspected cellulitis of left foot> right  Chronic Gout on chronic colchicine ; ortho c/s reveals follow gout treatment   PMH: alcohol  abuse in remission, anxiety, arthritis, depression, type 2 diabetes, hyperlipidemia, hypertension, hypothyroidism, rheumatic fever as a teenager, sickle cell trait, history of CVA with mild residual right-sided weakness and occasional speech disturbance, history of cocaine  abuse in remission    PT Comments  Pt seen for PT tx with pt agreeable. Pt is making good progress with mobility, completing bed mobility & transfers with mod I, ambulating in room, bathroom & hallway with RW & mod I. Discussed DME & f/u therapy recommendations. Will continue to follow pt acutely to progress mobility with LRAD as pt reports he ambulated without AD prior to admission.    If plan is discharge home, recommend the following: Assistance with cooking/housework;Assist for transportation;Help with stairs or ramp for entrance;A little help with walking and/or transfers;A little help with bathing/dressing/bathroom   Can travel by private vehicle     Yes  Equipment Recommendations  Rolling walker (2 wheels);BSC/3in1    Recommendations for Other Services       Precautions / Restrictions Precautions Precautions: Fall Restrictions Weight Bearing Restrictions Per Provider Order: No     Mobility  Bed Mobility Overal bed mobility: Modified Independent Bed Mobility: Supine to Sit, Sit to Supine     Supine to sit: Modified independent  (Device/Increase time), HOB elevated Sit to supine: Modified independent (Device/Increase time), HOB elevated        Transfers Overall transfer level: Modified independent Equipment used: Rolling walker (2 wheels), None Transfers: Sit to/from Stand Sit to Stand: Modified independent (Device/Increase time)           General transfer comment: sit>stand from EOB with RW, sit<>stand from toilet without assistance    Ambulation/Gait Ambulation/Gait assistance: Supervision, Modified independent (Device/Increase time) Gait Distance (Feet): 10 Feet (+ 110 ft) Assistive device: Rolling walker (2 wheels) Gait Pattern/deviations: Step-through pattern Gait velocity: decreased         Stairs             Wheelchair Mobility     Tilt Bed    Modified Rankin (Stroke Patients Only)       Balance Overall balance assessment: Needs assistance Sitting-balance support: Feet supported, No upper extremity supported Sitting balance-Leahy Scale: Good     Standing balance support: During functional activity, No upper extremity supported Standing balance-Leahy Scale: Good                 High Level Balance Comments: Pt picking up BSC & moving it in bathroom without assistance without LOB            Communication Communication Communication: No apparent difficulties  Cognition Arousal: Alert Behavior During Therapy: WFL for tasks assessed/performed   PT - Cognitive impairments: No apparent impairments                       PT - Cognition Comments: particular Following commands: Intact  Cueing Cueing Techniques: Verbal cues  Exercises      General Comments General comments (skin integrity, edema, etc.): continent BM & performs peri hygiene without assistance      Pertinent Vitals/Pain Pain Assessment Pain Assessment: Faces Faces Pain Scale: Hurts little more Pain Location: BLE Pain Descriptors / Indicators: Discomfort Pain  Intervention(s): Monitored during session    Home Living                          Prior Function            PT Goals (current goals can now be found in the care plan section) Acute Rehab PT Goals Patient Stated Goal: Regain IND PT Goal Formulation: With patient Time For Goal Achievement: 04/11/24 Potential to Achieve Goals: Good Progress towards PT goals: Progressing toward goals    Frequency           PT Plan      Co-evaluation              AM-PAC PT 6 Clicks Mobility   Outcome Measure  Help needed turning from your back to your side while in a flat bed without using bedrails?: None Help needed moving from lying on your back to sitting on the side of a flat bed without using bedrails?: None Help needed moving to and from a bed to a chair (including a wheelchair)?: None Help needed standing up from a chair using your arms (e.g., wheelchair or bedside chair)?: None Help needed to walk in hospital room?: None Help needed climbing 3-5 steps with a railing? : A Little 6 Click Score: 23    End of Session   Activity Tolerance: Patient tolerated treatment well Patient left: in bed;with call bell/phone within reach;with bed alarm set Nurse Communication: Mobility status PT Visit Diagnosis: Difficulty in walking, not elsewhere classified (R26.2);Muscle weakness (generalized) (M62.81);Repeated falls (R29.6);Pain Pain - Right/Left:  (bilateral) Pain - part of body: Ankle and joints of foot     Time: 8975-8953 PT Time Calculation (min) (ACUTE ONLY): 22 min  Charges:    $Therapeutic Activity: 8-22 mins PT General Charges $$ ACUTE PT VISIT: 1 Visit                     Richerd Pinal, PT, DPT 03/30/24, 10:56 AM   Richerd CHRISTELLA Pinal 03/30/2024, 10:55 AM

## 2024-03-30 NOTE — Plan of Care (Signed)
  Problem: Education: Goal: Knowledge of General Education information will improve Description: Including pain rating scale, medication(s)/side effects and non-pharmacologic comfort measures Outcome: Adequate for Discharge   Problem: Health Behavior/Discharge Planning: Goal: Ability to manage health-related needs will improve Outcome: Adequate for Discharge   Problem: Clinical Measurements: Goal: Ability to maintain clinical measurements within normal limits will improve Outcome: Progressing Goal: Will remain free from infection Outcome: Progressing Goal: Diagnostic test results will improve Outcome: Progressing Goal: Respiratory complications will improve Outcome: Adequate for Discharge Goal: Cardiovascular complication will be avoided Outcome: Adequate for Discharge   Problem: Activity: Goal: Risk for activity intolerance will decrease Outcome: Progressing   Problem: Nutrition: Goal: Adequate nutrition will be maintained Outcome: Completed/Met   Problem: Coping: Goal: Level of anxiety will decrease Outcome: Progressing   Problem: Elimination: Goal: Will not experience complications related to bowel motility Outcome: Completed/Met Goal: Will not experience complications related to urinary retention Outcome: Completed/Met   Problem: Pain Managment: Goal: General experience of comfort will improve and/or be controlled Outcome: Progressing   Problem: Safety: Goal: Ability to remain free from injury will improve Outcome: Progressing   Problem: Skin Integrity: Goal: Risk for impaired skin integrity will decrease Outcome: Completed/Met   Problem: Clinical Measurements: Goal: Ability to avoid or minimize complications of infection will improve Outcome: Progressing   Problem: Skin Integrity: Goal: Skin integrity will improve Outcome: Adequate for Discharge   Problem: Fluid Volume: Goal: Ability to maintain a balanced intake and output will improve Outcome:  Completed/Met   Problem: Health Behavior/Discharge Planning: Goal: Ability to manage health-related needs will improve Outcome: Adequate for Discharge   Problem: Metabolic: Goal: Ability to maintain appropriate glucose levels will improve Outcome: Progressing   Problem: Nutritional: Goal: Maintenance of adequate nutrition will improve Outcome: Completed/Met   Problem: Skin Integrity: Goal: Risk for impaired skin integrity will decrease Outcome: Adequate for Discharge   Problem: Tissue Perfusion: Goal: Adequacy of tissue perfusion will improve Outcome: Adequate for Discharge

## 2024-03-30 NOTE — Progress Notes (Signed)
 [facility]                                                                              Phillip Turner, is a 63 y.o. male, DOB - 1960-06-27, FMW:[id number] Admit date - 03/23/2024    Outpatient Primary MD for the patient is [name], MD  LOS - 7  days  Chief Complaint  Patient presents with   Gout       Brief summary   Patient is a 63 year old male with a history of alcohol  use in remission, anxiety, arthritis, depression, DM type II, HLD, HTN, hypothyroidism, rheumatic fever as a teenager, sickle cell trait, history of CVA with mild residual right-sided weakness, history of cocaine  use in remission, presented with left foot pain and edema, similar to prior acute gout episodes for last 2 weeks, had not responded to his colchicine  use.  left foot x-ray>>no fracture or dislocation.  Diffuse soft tissue swelling and subcutaneous edema that may represent cellulitis. CT chest without>>no acute cardiopulmonary disease. 2 adjacent 2 mm nodules over the right upper lobe.  No follow-up negative if the patient is low risk ((and has no known more suspected primary neoplasm).  If high risk noncontrast chest CT in 12 months.Moderate cholelithiasis.1.7 cm left upper pole kidney lesion that is likely a cyst.  Initially placed on IV antibiotics, with no significant improvement.  Orthopedics was consulted.  MRI left foot and ankle-see report patient is versus erosive changes tendinitis calcification soft tissue edema and other findings of gout, recommended to continue current management.   Assessment & Plan    Pain swelling on b/l ankles and feet Initially suspected cellulitis of left foot> right Acute on chronic Gout on bilateral ankle/foot- PTA on chronic colchicine : - Left foot/ankle pain and swelling worse than the right and progressively worsening, despite taking home colchicine  bid -Initially started on IV antibiotics, adjusted to Rocephin  and vancomycin  with no  significant improvement. -Blood cultures NTD, venous Dopplers negative for DVT  - ESR, CRP elevated.  CT finding concerning for tendinitis calcification-changes of chronic gout. -Orthopedics consulted, Dr Barton recommended gout management. -  s/p MRI left foot and ankle-see report patient is versus erosive changes tendinitis calcification soft tissue edema and other findings of gout.  - Started on IV Solu-Medrol , reported improvement since starting Solu-Medrol .   - Continue PT OT, colchicine , pain control - Uric acid 6.8, states allopurinol  makes his leg swell.  Recommend to follow-up with rheumatology after discharge. - Doing well, will work with PT today and hopefully DC home tomorrow.  Will transition to oral prednisone  at discharge.     Chronic back pain-Lumbar stenosis without neurogenic claudication - Continue Robaxin , pain control   Depression -Continue trazodone     Type 2 diabetes mellitus: A1c 5.6. -  holding home dulaglutide  CBG (last 3)  Recent Labs    03/29/24 2135 03/30/24 0725 03/30/24 1119  GLUCAP 107* 158* 207*   Continue sliding scale insulin  while inpatient. - Patient has Trulicity  and Jardiance  at home Has intolerance to metformin , will check with the patient if okay with low-dose Amaryl  Tradjenta after discharge while on prednisone .  Hypokalemia: Replace as  needed   Hyperlipidemia Cont atorvastatin    Hypothyroidism Continue home levothyroxine  137 mcg p.o. daily.   HTN Stable. continue metoprolol  and hydralazine  prn.   Normocytic anemia Monitor hematocrit and hemoglobin.   Thrombocytosis In the setting of anemia and acute infection.   Mild protein malnutrition, albumin  3.0 In the setting of acute infection.    2 mm nodules over the right upper lobe.: No follow-up negative if the patient is low risk (and has no known more suspected primary neoplasm). If high risk noncontrast chest CT in 12 months.-Patient needs to be instructed before  discharge    Moderate cholelithiasis: incidentally noticed    1.7 cm left upper pole kidney lesion that is likely a cyst: Ultrasound imaging recommended for further evaluation once stable  Class II obesity Estimated body mass index is 36.22 kg/m as calculated from the following:   Height as of this encounter: 5' 10 (1.778 m).   Weight as of this encounter: 114.5 kg.  Code Status: Full CODE STATUS DVT Prophylaxis:  Lovenox    Level of Care: Level of care: Med-Surg Family Communication: Updated patient Disposition Plan:      Remains inpatient appropriate: Possible DC home in a.m.,   Procedures:    Consultants:   Orthopedics  Antimicrobials:   Anti-infectives (From admission, onward)    Start     Dose/Rate Route Frequency Ordered Stop   03/27/24 1300  cefTRIAXone  (ROCEPHIN ) 2 g in sodium chloride  0.9 % 100 mL IVPB        2 g 200 mL/hr over 30 Minutes Intravenous Every 24 hours 03/27/24 1122 03/29/24 1138   03/26/24 0600  vancomycin  (VANCOREADY) IVPB 1250 mg/250 mL  Status:  Discontinued        1,250 mg 166.7 mL/hr over 90 Minutes Intravenous Every 12 hours 03/25/24 1821 03/27/24 1121   03/25/24 1600  vancomycin  (VANCOREADY) IVPB 2000 mg/400 mL        2,000 mg 200 mL/hr over 120 Minutes Intravenous  Once 03/25/24 1514 03/25/24 1800   03/24/24 1300  cefTRIAXone  (ROCEPHIN ) 2 g in sodium chloride  0.9 % 100 mL IVPB  Status:  Discontinued        2 g 200 mL/hr over 30 Minutes Intravenous Every 24 hours 03/23/24 1431 03/27/24 1122   03/23/24 1300  cefTRIAXone  (ROCEPHIN ) 2 g in sodium chloride  0.9 % 100 mL IVPB        2 g 200 mL/hr over 30 Minutes Intravenous  Once 03/23/24 1250 03/23/24 1434          Medications  aspirin  EC  325 mg Oral Daily   atorvastatin   10 mg Oral QHS   colchicine   0.6 mg Oral BID   empagliflozin   25 mg Oral Daily   enoxaparin  (LOVENOX ) injection  55 mg Subcutaneous Q24H   feeding supplement  237 mL Oral BID BM   folic acid   1 mg Oral Daily    insulin  aspart  0-6 Units Subcutaneous TID WC   levothyroxine   137 mcg Oral QAC breakfast   methylPREDNISolone  (SOLU-MEDROL ) injection  60 mg Intravenous Q12H   metoprolol  succinate  25 mg Oral BID   ondansetron   8 mg Oral Once   traZODone   200 mg Oral QHS      Subjective:   Phillip Turner was seen and examined today.  Overall improving, no acute issues.  Pain in the heels improving, working with PT.  Hope to go home.  Objective:   Vitals:   03/29/24 1307 03/29/24 2134 03/30/24 0617 03/30/24  0928  BP: (!) 145/77 (!) 152/97 125/81   Pulse: 66 (!) 59 60 68  Resp: 16 18 15    Temp: 97.6 F (36.4 C) 97.8 F (36.6 C) (!) 97.5 F (36.4 C)   TempSrc: Oral Oral Oral   SpO2: 99% 100% 98%   Weight:      Height:        Intake/Output Summary (Last 24 hours) at 03/30/2024 1300 Last data filed at 03/30/2024 1000 Gross per 24 hour  Intake 360 ml  Output 1550 ml  Net -1190 ml     Wt Readings from Last 3 Encounters:  03/23/24 114.5 kg  02/07/19 113 kg  11/27/18 113.4 kg   Physical Exam General: Alert and oriented x 3, NAD Cardiovascular: S1 S2 clear, RRR.  Respiratory: CTAB, no wheezing, rales or rhonchi Gastrointestinal: Soft, nontender, nondistended, NBS Zku:ezijo edema improving Neuro: no new deficits Psych: Normal affect       Data Reviewed:  I have personally reviewed following labs    CBC Lab Results  Component Value Date   WBC 11.9 (H) 03/30/2024   RBC 3.55 (L) 03/30/2024   HGB 9.5 (L) 03/30/2024   HCT 30.0 (L) 03/30/2024   MCV 84.5 03/30/2024   MCH 26.8 03/30/2024   PLT 681 (H) 03/30/2024   MCHC 31.7 03/30/2024   RDW 18.0 (H) 03/30/2024   LYMPHSABS 2.2 11/28/2017   MONOABS 1.2 (H) 11/28/2017   EOSABS 0.2 11/28/2017   BASOSABS 0.0 11/28/2017     Last metabolic panel Lab Results  Component Value Date   NA 139 03/30/2024   K 3.8 03/30/2024   CL 107 03/30/2024   CO2 22 03/30/2024   BUN 21 03/30/2024   CREATININE 0.87 03/30/2024   GLUCOSE 181  (H) 03/30/2024   GFRNONAA >60 03/30/2024   GFRAA >60 11/24/2018   CALCIUM  9.2 03/30/2024   PROT 6.5 03/24/2024   ALBUMIN  3.0 (L) 03/24/2024   BILITOT 0.9 03/24/2024   ALKPHOS 108 03/24/2024   AST 31 03/24/2024   ALT 24 03/24/2024   ANIONGAP 10 03/30/2024    CBG (last 3)  Recent Labs    03/29/24 2135 03/30/24 0725 03/30/24 1119  GLUCAP 107* 158* 207*      Coagulation Profile: No results for input(s): INR, PROTIME in the last 168 hours.   Radiology Studies: I have personally reviewed the imaging studies  No results found.      Nydia Distance M.D. [facility] 03/30/2024, 1:00 PM  Available via Epic secure chat 7am-7pm After 7 pm, please refer to night coverage provider listed on amion.

## 2024-03-31 ENCOUNTER — Other Ambulatory Visit (HOSPITAL_COMMUNITY): Payer: Self-pay

## 2024-03-31 DIAGNOSIS — M1000 Idiopathic gout, unspecified site: Secondary | ICD-10-CM | POA: Diagnosis not present

## 2024-03-31 DIAGNOSIS — M7989 Other specified soft tissue disorders: Secondary | ICD-10-CM | POA: Diagnosis not present

## 2024-03-31 DIAGNOSIS — E1100 Type 2 diabetes mellitus with hyperosmolarity without nonketotic hyperglycemic-hyperosmolar coma (NKHHC): Secondary | ICD-10-CM | POA: Diagnosis not present

## 2024-03-31 DIAGNOSIS — L03116 Cellulitis of left lower limb: Secondary | ICD-10-CM | POA: Diagnosis not present

## 2024-03-31 LAB — GLUCOSE, CAPILLARY: Glucose-Capillary: 125 mg/dL — ABNORMAL HIGH (ref 70–99)

## 2024-03-31 MED ORDER — OXYCODONE-ACETAMINOPHEN 10-325 MG PO TABS
1.0000 | ORAL_TABLET | Freq: Four times a day (QID) | ORAL | 0 refills | Status: AC | PRN
  Filled 2024-03-31: qty 20, 5d supply, fill #0

## 2024-03-31 MED ORDER — COLCHICINE 0.6 MG PO TABS
0.6000 mg | ORAL_TABLET | Freq: Two times a day (BID) | ORAL | 3 refills | Status: AC
  Filled 2024-03-31: qty 60, 30d supply, fill #0

## 2024-03-31 MED ORDER — PREDNISONE 10 MG PO TABS
ORAL_TABLET | ORAL | 0 refills | Status: AC
Start: 2024-03-31 — End: ?
  Filled 2024-03-31: qty 30, 12d supply, fill #0

## 2024-03-31 NOTE — Progress Notes (Signed)
 Discharge medications delivered to patient at the bedside.

## 2024-03-31 NOTE — Progress Notes (Signed)
 AVS given and meds delivered. Patient is refusing to get dressed at this time stating that  He will get dressed when he finishes having lunch with his girlfriend

## 2024-03-31 NOTE — Discharge Summary (Signed)
 Physician Discharge Summary   Patient: Phillip Turner MRN: [id number] DOB: 06-Dec-1960  Admit date:     03/23/2024  Discharge date: 03/31/24  Discharge Physician: Nydia Distance, MD    PCP: [name], MD   Recommendations at discharge:   Continue prednisone  with taper 40 mg for 3 days, 30 mg for 3 days, 20 mg for 3 days, 10 mg for 3 days then off.   Recommended to follow-up with PCP within next 7 to 10 days and recommend outpatient referral to rheumatology  2 mm nodules over the right upper lobe, No follow-up if the patient is low risk (and has no known more suspected primary neoplasm). If high risk, noncontrast chest CT in 12 months  Discharge Diagnoses:  Acute on chronic gout flare bilateral feet   Depression   Type 2 diabetes mellitus (HCC)   Hypothyroidism   HTN (hypertension)   Pulmonary nodules   Normocytic anemia   Thrombocytosis   Lumbar stenosis without neurogenic claudication   Class II obesity   Mild protein malnutrition   Hyperlipidemia     Hospital Course:  Patient is a 63 year old male with a history of alcohol  use in remission, anxiety, arthritis, depression, DM type II, HLD, HTN, hypothyroidism, rheumatic fever as a teenager, sickle cell trait, history of CVA with mild residual right-sided weakness, history of cocaine  use in remission, presented with left foot pain and edema, similar to prior acute gout episodes for last 2 weeks, had not responded to his colchicine  use.   left foot x-ray>>no fracture or dislocation.  Diffuse soft tissue swelling and subcutaneous edema that may represent cellulitis. CT chest without>>no acute cardiopulmonary disease. 2 adjacent 2 mm nodules over the right upper lobe.  No follow-up negative if the patient is low risk ((and has no known more suspected primary neoplasm).  If high risk noncontrast chest CT in 12 months.Moderate cholelithiasis.1.7 cm left upper pole kidney lesion that is likely a cyst.   Initially placed on IV  antibiotics, with no significant improvement.  Orthopedics was consulted.  MRI left foot and ankle-see report patient is versus erosive changes tendinitis calcification soft tissue edema and other findings of gout, recommended to continue current management.   Assessment and Plan:   Pain swelling on b/l ankles and feet Acute on chronic Gout on bilateral ankle/foot- PTA on chronic colchicine : - Left foot/ankle pain and swelling worse than the right and progressively worsening, despite taking home colchicine  bid -Initially started on IV antibiotics, adjusted to Rocephin  and vancomycin  with no significant improvement. -Blood cultures NTD, venous Dopplers negative for DVT  - ESR, CRP elevated.  CT finding concerning for tendinitis calcification-changes of chronic gout. -Orthopedics consulted, Dr Barton recommended gout management. -  s/p MRI left foot and ankle-see report patient is versus erosive changes tendinitis calcification soft tissue edema and other findings of gout.  - Started on IV Solu-Medrol , reported improvement since starting Solu-Medrol .   - Continue PT OT, colchicine , pain control - Uric acid 6.8, states allopurinol  makes his leg swell.  Recommend to follow-up with rheumatology after discharge. - Transitioned to oral prednisone .  Outpatient referral and follow-up with rheumatology recommended       Chronic back pain-Lumbar stenosis without neurogenic claudication - Continue pain control     Depression -Continue trazodone      Type 2 diabetes mellitus: A1c 5.6. - Continue Trulicity    Hypokalemia: Replace as needed   Hyperlipidemia Cont atorvastatin    Hypothyroidism Continue home levothyroxine  137 mcg p.o. daily.   HTN  Stable. continue metoprolol    Normocytic anemia Hemoglobin stable, 9.5   Thrombocytosis In the setting of anemia and acute infection.   Mild protein malnutrition, albumin  3.0 In the setting of acute infection.    2 mm nodules over the  right upper lobe.: No follow-up if the patient is low risk (and has no known more suspected primary neoplasm). If high risk, noncontrast chest CT in 12 months    Moderate cholelithiasis: incidentally noticed    1.7 cm left upper pole kidney lesion that is likely a cyst: Ultrasound imaging recommended for further evaluation once stable   Class II obesity Estimated body mass index is 36.22 kg/m as calculated from the following:   Height as of this encounter: 5' 10 (1.778 m).   Weight as of this encounter: 114.5 kg.       Pain control - [place]  Controlled Substance Reporting System database was reviewed. and patient was instructed, not to drive, operate heavy machinery, perform activities at heights, swimming or participation in water activities or provide baby-sitting services while on Pain, Sleep and Anxiety Medications; until their outpatient Physician has advised to do so again. Also recommended to not to take more than prescribed Pain, Sleep and Anxiety Medications.  Consultants: Orthopedic Procedures performed: None Disposition: Home Diet recommendation:  Discharge Diet Orders (From admission, onward)     Start     Ordered   03/31/24 0000  Diet Carb Modified        03/31/24 1010           Carb modified diet DISCHARGE MEDICATION: Allergies as of 03/31/2024       Reactions   Clonidine  Derivatives Other (See Comments)   Slow heartbeat   Shellfish Allergy Anaphylaxis, Nausea And Vomiting, Swelling   Naproxen  Swelling   Allopurinol  Swelling, Other (See Comments)   Made the legs swell   Gabapentin  Swelling, Other (See Comments)   Made the legs swell   Indomethacin  Swelling   Lyrica  [pregabalin ] Swelling, Other (See Comments)   Made the legs swell   Invokana  [canagliflozin ] Swelling, Other (See Comments)   Headaches and made the legs swell   Metformin  And Related Nausea And Vomiting, Other (See Comments)   Patient states it makes him sick   Other Itching,  Swelling, Rash   Stainless steel.     Not pure metals; cheap metals        Medication List     TAKE these medications    acetaminophen  500 MG tablet Commonly known as: TYLENOL  Take 500-1,000 mg by mouth daily as needed for mild pain (pain score 1-3) or headache.   Artificial Tears 0.1-0.3 % Soln Generic drug: Dextran 70-Hypromellose Place 1 drop into both eyes in the morning and at bedtime.   aspirin  EC 325 MG tablet Take 1 tablet (325 mg total) by mouth daily. For 30 days postop for DVT prophylaxis.  Resume home dose when complete.   atorvastatin  10 MG tablet Commonly known as: LIPITOR Take 10 mg by mouth at bedtime.   colchicine  0.6 MG tablet Take 1 tablet (0.6 mg total) by mouth in the morning and at bedtime.   empagliflozin  25 MG Tabs tablet Commonly known as: JARDIANCE  Take 25 mg by mouth daily.   folic acid  800 MCG tablet Commonly known as: FOLVITE  Take 800 mcg by mouth daily.   levothyroxine  137 MCG tablet Commonly known as: SYNTHROID  Take 137 mcg by mouth daily before breakfast.   metoprolol  succinate 25 MG 24 hr tablet Commonly known as:  TOPROL -XL Take 25 mg by mouth in the morning and at bedtime.   oxyCODONE -acetaminophen  10-325 MG tablet Commonly known as: PERCOCET Take 1 tablet by mouth every 6 (six) hours as needed for pain. What changed:  when to take this reasons to take this   potassium chloride  SA 20 MEQ tablet Commonly known as: KLOR-CON  M Take 20 mEq by mouth See admin instructions. Take 20 mEq by mouth in the morning and a second dose later in the day as needed for sudden sweating   predniSONE  10 MG tablet Commonly known as: DELTASONE  Take  Prednisone  40mg  (4 tabs) x 3 days, then taper to 30mg  (3 tabs) x 3 days, then 20mg  (2 tabs) x 3days, then 10mg  (1 tab) x 3days, then OFF.   sildenafil 100 MG tablet Commonly known as: VIAGRA Take 100 mg by mouth daily as needed for erectile dysfunction.   traZODone  100 MG tablet Commonly known  as: DESYREL  Take 200 mg by mouth at bedtime.   Trulicity  3 MG/0.5ML Soaj Generic drug: Dulaglutide  Inject 3 mg into the skin once a week. What changed: when to take this   VITAMIN D3 PO Take 1 capsule by mouth daily.               Durable Medical Equipment  (From admission, onward)           Start     Ordered   03/30/24 1213  For home use only DME Bedside commode  Once       Question:  Patient needs a bedside commode to treat with the following condition  Answer:  Gait instability   03/30/24 1212   03/30/24 1212  For home use only DME Walker rolling  Once       Question Answer Comment  Walker: With 5 Inch Wheels   Patient needs a walker to treat with the following condition Gait instability      03/30/24 1211            Follow-up Information     [name], MD. Schedule an appointment as soon as possible for a visit in 2 week(s).   Specialty: Internal Medicine Why: for hospital follow-up Contact information: [address] [place] [address] [phone]                Discharge Exam: Fredricka Weights   03/23/24 1511  Weight: 114.5 kg   S: Feeling a lot better, able to ambulate and bear weight on his feet.  Feels ready to go home.  BP (!) 148/89 (BP Location: Left Arm)   Pulse 64   Temp 97.8 F (36.6 C) (Oral)   Resp 17   Ht 5' 10 (1.778 m)   Wt 114.5 kg   SpO2 100%   BMI 36.22 kg/m   Physical Exam General: Alert and oriented x 3, NAD Cardiovascular: S1 S2 clear, RRR.  Respiratory: CTAB, no wheezing, rales or rhonchi Gastrointestinal: Soft, nontender, nondistended, NBS Ext: pedal edema bilaterally significantly improved Neuro: no new deficits Psych: Normal affect    Condition at discharge: fair  The results of significant diagnostics from this hospitalization (including imaging, microbiology, ancillary and laboratory) are listed below for reference.   Imaging Studies: MR FOOT LEFT W WO CONTRAST Result Date:  03/26/2024 CLINICAL DATA:  Left foot and ankle pain and swelling. History of gout. Cellulitis. EXAM: MRI OF THE LEFT FOREFOOT WITHOUT AND WITH CONTRAST; MRI OF THE LEFT ANKLE WITHOUT AND WITH CONTRAST TECHNIQUE: Multiplanar, multisequence MR imaging of  the left ankle and foot was performed both before and after administration of intravenous contrast. CONTRAST:  10mL GADAVIST  GADOBUTROL  1 MMOL/ML IV SOLN COMPARISON:  CT left ankle dated 03/25/2024. FINDINGS: Bones/Joint/Cartilage No acute fracture or dislocation. Osseous erosions at the medial and lateral malleoli, subtalar joints, dorsal midfoot, Lisfranc joint, and juxta-articular base of the fourth metatarsal, with adjacent amorphous calcification along the level of the distal tibiofibular syndesmosis, structures of the lateral ligamentous complex, and deltoid ligament. There is associated patchy periarticular and juxta-articular edema and enhancement. Osteonecrosis within the cuboid with surrounding marrow edema. There is a 3.1 x 2.8 x 2.8 cm multiseptated cystic structure with heterogenous T2 signal, foci of low signal intensity, and a thin enhancing low signal intensity rim, which corresponds to the larger cluster of calcifications below the base of the first metatarsal noted on the prior CT of the left ankle (series 14, image 17 and series 12, image 17). Periarticular soft tissue edema and enhancement at the midfoot. Suspected osteonecrosis within the fourth metatarsal shaft. Periarticular erosive changes of the first metatarsal head with corresponding amorphous calcifications and chondrocalcinosis along the ligamentous structures of the first MTP joint. No significant joint effusion. Ligaments Amorphous calcification along the level of the distal tibiofibular syndesmosis, structures of the lateral ligamentous complex, and deltoid ligament. Lisfranc ligament appears intact. Loss of fat signal within the sinus tarsi. Muscles and Tendons Tenosynovitis of the  common peroneal tendon sheath extending from above the level of the lateral malleolus through the insertions distally. Mild tendinosis and tenosynovitis of the posterior tibial tendon. Tendinosis and tenosynovitis of the extensor digitorum tendon at the ankle. Achilles tendon and plantar fascia are intact. Soft tissue Circumferential subcutaneous edema extending from the ankle through the dorsal forefoot. A 9 x 9 x 6 mm T2 hyperintense peripherally enhancing focus within the plantar subcutaneous tissues overlying the fifth metatarsal head (series 9, image 22 and series 7, image 27), could reflect a ganglion cyst, although, the sterility of this fluid remains indeterminate. IMPRESSION: 1. Osseous erosive changes with calcific tendinitis and amorphous calcifications involving the ankle, midfoot, and first metatarsal head, as described above. There is associated periarticular marrow and soft tissue edema and enhancement of the midfoot and ankle. A 3.1 x 2.8 x 2.8 cm multiseptated heterogenous cystic structure with foci of low signal intensity and a thin enhancing low signal intensity rim corresponds to the larger cluster of calcifications below the base of the first metatarsal noted on the prior CT of the left ankle. These findings could reflect a combination of gout and/or tumoral calcinosis/calcific periarthritis. Superimposed acute infection can not be entirely excluded. 2. A 9 mm peripherally enhancing fluid intensity focus within the plantar subcutaneous tissues overlying the fifth metatarsal head could reflect a ganglion cyst, although, the sterility of this fluid remains indeterminate. 3. Tenosynovitis of the common peroneal tendon sheath extending from above the level of the lateral malleolus through the insertions distally. Mild tendinosis and tenosynovitis of the posterior tibial tendon. Tendinosis and tenosynovitis of the extensor digitorum tendon. 4. Amorphous calcification along the level of the distal  tibiofibular syndesmosis, structures of the lateral ligamentous complex, and deltoid ligament. Lisfranc ligament appears intact. Loss of fat signal within the sinus tarsi. 5. Subcutaneous edema extending from the ankle through the dorsal foot. Electronically Signed   By: Harrietta Sherry M.D.   On: 03/26/2024 11:52   MR ANKLE LEFT W WO CONTRAST Result Date: 03/26/2024 CLINICAL DATA:  Left foot and ankle pain and swelling. History of  gout. Cellulitis. EXAM: MRI OF THE LEFT FOREFOOT WITHOUT AND WITH CONTRAST; MRI OF THE LEFT ANKLE WITHOUT AND WITH CONTRAST TECHNIQUE: Multiplanar, multisequence MR imaging of the left ankle and foot was performed both before and after administration of intravenous contrast. CONTRAST:  10mL GADAVIST  GADOBUTROL  1 MMOL/ML IV SOLN COMPARISON:  CT left ankle dated 03/25/2024. FINDINGS: Bones/Joint/Cartilage No acute fracture or dislocation. Osseous erosions at the medial and lateral malleoli, subtalar joints, dorsal midfoot, Lisfranc joint, and juxta-articular base of the fourth metatarsal, with adjacent amorphous calcification along the level of the distal tibiofibular syndesmosis, structures of the lateral ligamentous complex, and deltoid ligament. There is associated patchy periarticular and juxta-articular edema and enhancement. Osteonecrosis within the cuboid with surrounding marrow edema. There is a 3.1 x 2.8 x 2.8 cm multiseptated cystic structure with heterogenous T2 signal, foci of low signal intensity, and a thin enhancing low signal intensity rim, which corresponds to the larger cluster of calcifications below the base of the first metatarsal noted on the prior CT of the left ankle (series 14, image 17 and series 12, image 17). Periarticular soft tissue edema and enhancement at the midfoot. Suspected osteonecrosis within the fourth metatarsal shaft. Periarticular erosive changes of the first metatarsal head with corresponding amorphous calcifications and chondrocalcinosis  along the ligamentous structures of the first MTP joint. No significant joint effusion. Ligaments Amorphous calcification along the level of the distal tibiofibular syndesmosis, structures of the lateral ligamentous complex, and deltoid ligament. Lisfranc ligament appears intact. Loss of fat signal within the sinus tarsi. Muscles and Tendons Tenosynovitis of the common peroneal tendon sheath extending from above the level of the lateral malleolus through the insertions distally. Mild tendinosis and tenosynovitis of the posterior tibial tendon. Tendinosis and tenosynovitis of the extensor digitorum tendon at the ankle. Achilles tendon and plantar fascia are intact. Soft tissue Circumferential subcutaneous edema extending from the ankle through the dorsal forefoot. A 9 x 9 x 6 mm T2 hyperintense peripherally enhancing focus within the plantar subcutaneous tissues overlying the fifth metatarsal head (series 9, image 22 and series 7, image 27), could reflect a ganglion cyst, although, the sterility of this fluid remains indeterminate. IMPRESSION: 1. Osseous erosive changes with calcific tendinitis and amorphous calcifications involving the ankle, midfoot, and first metatarsal head, as described above. There is associated periarticular marrow and soft tissue edema and enhancement of the midfoot and ankle. A 3.1 x 2.8 x 2.8 cm multiseptated heterogenous cystic structure with foci of low signal intensity and a thin enhancing low signal intensity rim corresponds to the larger cluster of calcifications below the base of the first metatarsal noted on the prior CT of the left ankle. These findings could reflect a combination of gout and/or tumoral calcinosis/calcific periarthritis. Superimposed acute infection can not be entirely excluded. 2. A 9 mm peripherally enhancing fluid intensity focus within the plantar subcutaneous tissues overlying the fifth metatarsal head could reflect a ganglion cyst, although, the sterility of  this fluid remains indeterminate. 3. Tenosynovitis of the common peroneal tendon sheath extending from above the level of the lateral malleolus through the insertions distally. Mild tendinosis and tenosynovitis of the posterior tibial tendon. Tendinosis and tenosynovitis of the extensor digitorum tendon. 4. Amorphous calcification along the level of the distal tibiofibular syndesmosis, structures of the lateral ligamentous complex, and deltoid ligament. Lisfranc ligament appears intact. Loss of fat signal within the sinus tarsi. 5. Subcutaneous edema extending from the ankle through the dorsal foot. Electronically Signed   By: Harrietta Marcelino HERO.D.  On: 03/26/2024 11:52   CT ANKLE LEFT W CONTRAST Result Date: 03/25/2024 EXAM: CT LEFT ANKLE, WITH IV CONTRAST 03/25/2024 10:33:54 AM TECHNIQUE: Axial images were acquired through the left ankle with IV contrast. Reformatted images were reviewed. Automated exposure control, iterative reconstruction, and/or weight based adjustment of the mA/kV was utilized to reduce the radiation dose to as low as reasonably achievable. Motion artifact is present, reducing diagnostic sensitivity and specificity. COMPARISON: None provided. CLINICAL HISTORY: Cellulitis. FINDINGS: BONES: No acute fracture or focal osseous lesion. Adjacent erosions noted along the distal tibial rim, subtalar joints, dorsal midfoot, and Lisfranc joint with adjacent amorphous calcifications. JOINTS: No dislocation. The joint spaces are normal. Chondrocalcinosis with amorphous calcifications along the distal tibiofibular articulation, below the malleoli, and along structures of the lateral ligamentous complex, deltoid ligament, superior ligament, and ligamentous structures in the sinus tarsi. Amorphous calcifications and chondrocalcinosis along ligamentous structures of the 1st metatarsophalangeal joint noted. There is a larger cluster of calcifications below the base of the 1st metatarsal measuring 2.7 x  2.8 x 2.8 cm on image 124 series 8. SOFT TISSUES: Circumferential subcutaneous edema in the ankle tracking into the dorsum of the foot. Calcification along the margins of the peroneal longus and brevis tendons noted with common peroneal tenosynovitis posterior to the lateral malleolus with faint calcification along the common tendon sheath margin. Mild distal achilles tendinopathy with convex anterior margin. Faint extensor tendon calcifications especially along the tibialis anterior and extensor digitorum longus. Mild thickening of the medial band of the plantar fascia, plantar fasciitis not excluded. IMPRESSION: 1. Calcific tendinitis with amorphous calcifications involving multiple ligamentous structures and tendons, with associated erosions and amorphous periarticular calcifications, largest below the base of the 1st metatarsal. 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 Gout could be contributory. 2. Circumferential subcutaneous edema in the ankle tracking into the dorsum of the foot. 3. No fracture observed. Electronically signed by: Ryan Salvage MD 03/25/2024 10:51 AM EST RP Workstation: [id number]   VAS US  LOWER EXTREMITY VENOUS (DVT) Result Date: 03/24/2024  Lower Venous DVT Study Patient Name:  Phillip Turner  Date of Exam:   03/24/2024 Medical Rec #: [id number]          Accession #:    [id number] Date of Birth: Dec 17, 1960           Patient Gender: M Patient Age:   39 years Exam Location:  [facility] Procedure:      VAS US  LOWER EXTREMITY VENOUS (DVT) Referring Phys: DAVID ORTIZ --------------------------------------------------------------------------------  Indications: Swelling.  Risk Factors: None identified. Limitations: Poor ultrasound/tissue interface. Comparison Study: No prior studies. Performing Technologist: Cordella Collet RVT  Examination Guidelines: A complete evaluation includes B-mode imaging, spectral Doppler, color Doppler, and power  Doppler as needed of all accessible portions of each vessel. Bilateral testing is considered an integral part of a complete examination. Limited examinations for reoccurring indications may be performed as noted. The reflux portion of the exam is performed with the patient in reverse Trendelenburg.  +-----+---------------+---------+-----------+----------+--------------+ RIGHTCompressibilityPhasicitySpontaneityPropertiesThrombus Aging +-----+---------------+---------+-----------+----------+--------------+ CFV  Full           Yes      Yes                                 +-----+---------------+---------+-----------+----------+--------------+   +---------+---------------+---------+-----------+----------+-------------------+ LEFT     CompressibilityPhasicitySpontaneityPropertiesThrombus Aging      +---------+---------------+---------+-----------+----------+-------------------+ CFV      Full  Yes      Yes                                      +---------+---------------+---------+-----------+----------+-------------------+ SFJ      Full                                                             +---------+---------------+---------+-----------+----------+-------------------+ FV Prox  Full                                                             +---------+---------------+---------+-----------+----------+-------------------+ FV Mid   Full                                                             +---------+---------------+---------+-----------+----------+-------------------+ FV Distal               Yes      Yes                                      +---------+---------------+---------+-----------+----------+-------------------+ PFV      Full                                                             +---------+---------------+---------+-----------+----------+-------------------+ POP      Full           Yes      Yes                                       +---------+---------------+---------+-----------+----------+-------------------+ PTV      Full                                                             +---------+---------------+---------+-----------+----------+-------------------+ PERO                                                  Not well visualized +---------+---------------+---------+-----------+----------+-------------------+    Summary: RIGHT: - No evidence of common femoral vein obstruction.   LEFT: - There is no evidence of deep vein thrombosis in the lower extremity. However, portions of this examination were limited- see technologist comments above.  -  No cystic structure found in the popliteal fossa.  *See table(s) above for measurements and observations. Electronically signed by Lonni Gaskins MD on 03/24/2024 at 10:53:09 AM.    Final    US  Abdomen Limited RUQ (LIVER/GB) Result Date: 03/23/2024 EXAM: Right Upper Quad[facility] Abdominal Ultrasound 03/23/2024 01:30:11 PM TECHNIQUE: Real-time ultrasonography of the right upper quadrant of the abdomen was performed. COMPARISON: None available. CLINICAL HISTORY: Abdominal pain. FINDINGS: LIVER: Increased echogenicity of hepatic parenchyma is noted suggesting hepatic steatosis. No intrahepatic biliary ductal dilatation. No evidence of mass. BILIARY SYSTEM: Gallbladder wall thickness measures 2.0 mm. Probable mild cholelithiasis or sludge is noted. No pericholecystic fluid. Negative sonographic Murphy's sign. Common bile duct measures 5.0 mm. OTHER: No right upper quadrant ascites. IMPRESSION: 1. Probable mild cholelithiasis or sludge. 2. Hepatic steatosis. Electronically signed by: Lynwood Seip MD 03/23/2024 01:46 PM EST RP Workstation: [id number]   DG Foot Complete Left Result Date: 03/23/2024 CLINICAL DATA:  Left foot pain and swelling. EXAM: LEFT FOOT - COMPLETE 3+ VIEW COMPARISON:  None Available. FINDINGS: No acute fracture or dislocation. The bones are mildly  osteopenic. Degenerative changes of the midfoot and spurring of the dorsum of the tarsal bone. There is diffuse soft tissue swelling and subcutaneous edema predominantly involving the dorsum of the foot. No radiopaque foreign object or soft tissue gas. IMPRESSION: 1. No acute fracture or dislocation. 2. Diffuse soft tissue swelling and subcutaneous edema. Findings may represent cellulitis. Clinical correlation is recommended. Electronically Signed   By: Vanetta Chou M.D.   On: 03/23/2024 11:31   CT Chest Wo Contrast Result Date: 03/23/2024 CLINICAL DATA:  Blunt trauma to chest with right posterior rib pain. EXAM: CT CHEST WITHOUT CONTRAST TECHNIQUE: Multidetector CT imaging of the chest was performed following the standard protocol without IV contrast. RADIATION DOSE REDUCTION: This exam was performed according to the departmental dose-optimization program which includes automated exposure control, adjustment of the mA and/or kV according to patient size and/or use of iterative reconstruction technique. COMPARISON:  CT abdomen 11/28/2017 FINDINGS: Cardiovascular: Heart is normal size. Thoracic aorta is normal caliber. Pulmonary arterial system and remaining vascular structures are unremarkable on this noncontrast exam. Mediastinum/Nodes: No mediastinal or hilar adenopathy. Remaining mediastinal structures are normal. Lungs/Pleura: Lungs are adequately inflated without acute airspace process or effusion. Two adjacent 2 mm nodules over the right upper lobe. Tiny amount of right basilar pleural fluid with associated atelectasis. Airways are normal. Upper Abdomen: Moderate cholelithiasis. 1.7 cm hypodensity over the upper pole left kidney with Hounsfield unit measurements of 23 likely a no acute findings over the visualized upper abdomen. Musculoskeletal: No focal abnormality. IMPRESSION: 1. No acute cardiopulmonary disease. No acute fracture. 2. Two adjacent 2 mm nodules over the right upper lobe. No follow-up  needed if patient is low-risk (and has no known or suspected primary neoplasm). Non-contrast chest CT can be considered in 12 months if patient is high-risk. This recommendation follows the consensus statement: Guidelines for Management of Incidental Pulmonary Nodules Detected on CT Images: From the Fleischner Society 2017; Radiology 2017; 284:228-243. 3. Moderate cholelithiasis. 4. 1.7 cm hypodensity over the upper pole left kidney likely a cyst. Recommend renal ultrasound for further evaluation. Electronically Signed   By: Toribio Agreste M.D.   On: 03/23/2024 11:27   CT T-SPINE NO CHARGE Result Date: 03/23/2024 CLINICAL DATA:  Chest trauma.  Right posterolateral rib pain. EXAM: CT Thoracic Spine without contrast TECHNIQUE: Multiplanar CT images of the thoracic spine were reconstructed from contemporary CT of the Chest. RADIATION DOSE  REDUCTION: This exam was performed according to the departmental dose-optimization program which includes automated exposure control, adjustment of the mA and/or kV according to patient size and/or use of iterative reconstruction technique. CONTRAST:  None COMPARISON:  Concurrent chest CT. Abdominopelvic CT 11/28/2017. Lumbar MRI 03/23/2020. FINDINGS: Alignment: Conventional anatomy with 12 rib-bearing thoracic type vertebral bodies. Mild convex right scoliosis. No focal angulation or listhesis. Vertebrae: No evidence of acute thoracic spine fracture or traumatic subluxation. No aggressive osseous lesions. Prominent paraspinal osteophytes consistent with diffuse idiopathic skeletal hyperostosis. Paraspinal and other soft tissues: No acute paraspinal findings are identified. Chest details dictated separately. Disc levels: No large disc herniation or high-grade spinal stenosis demonstrated within the thoracic spine. There is lower cervical spondylosis with disc bulging, endplate osteophytes and uncinate spurring at C6-7 and C7-T1. Previous left laminectomy at L1-2 with associated  circumferential osteophytes and mild canal narrowing, similar to previous MRI. IMPRESSION: 1. No evidence of acute thoracic spine fracture, traumatic subluxation or static signs of instability. 2. Diffuse idiopathic skeletal hyperostosis. 3. Grossly stable postsurgical changes at L1-2. Electronically Signed   By: Elsie Perone M.D.   On: 03/23/2024 11:26    Microbiology: Results for orders placed or performed during the hospital encounter of 03/23/24  MRSA Next Gen by PCR, Nasal     Status: None   Collection Time: 03/24/24 10:19 AM   Specimen: Nasal Mucosa; Nasal Swab  Result Value Ref Range Status   MRSA by PCR Next Gen NOT DETECTED NOT DETECTED Final    Comment: (NOTE) The GeneXpert MRSA Assay (FDA approved for NASAL specimens only), is one component of a comprehensive MRSA colonization surveillance program. It is not intended to diagnose MRSA infection nor to guide or monitor treatment for MRSA infections. Test performance is not FDA approved in patients less than 10 years old. Performed at [facility], [address], [place], [place] [address]   Culture, blood (Routine X 2) w Reflex to ID Panel     Status: None   Collection Time: 03/24/24 12:06 PM   Specimen: BLOOD  Result Value Ref Range Status   Specimen Description   Final    BLOOD BLOOD LEFT ARM Performed at [facility], [address], [place], [place] [address]    Special Requests   Final    BOTTLES DRAWN AEROBIC AND ANAEROBIC Blood Culture adequate volume Performed at [facility], [address] [place], [place] [address]    Culture   Final    NO GROWTH 5 DAYS Performed at [facility], [address], [place], [place] [address]    Report Status 03/29/2024 FINAL  Final  Culture, blood (Routine X 2) w Reflex to ID Panel     Status: None   Collection Time: 03/24/24 12:06 PM   Specimen: BLOOD  Result Value Ref Range Status   Specimen Description    Final    BLOOD BLOOD LEFT ARM Performed at [facility], [address] [place], [place] [address]    Special Requests   Final    BOTTLES DRAWN AEROBIC AND ANAEROBIC Blood Culture adequate volume Performed at [facility], [address], [place], [place] [address]    Culture   Final    NO GROWTH 5 DAYS Performed at [facility], [address], [place], [place] [address]    Report Status 03/29/2024 FINAL  Final    Labs: CBC: Recent Labs  Lab 03/26/24 0321 03/27/24  [id number] 03/28/24 0333 03/29/24 0322 03/30/24 0321  WBC 12.3* 11.6* 14.0* 14.8* 11.9*  HGB 10.1* 9.1* 10.1* 9.8* 9.5*  HCT 30.7* 28.3* 31.7* 30.0* 30.0*  MCV 85.3 85.0 85.7 83.6 84.5  PLT 541* 546* 646* 667* 681*   Basic Metabolic Panel: Recent Labs  Lab 03/26/24 0321 03/27/24 0312 03/28/24 0333 03/29/24 0322 03/30/24 0321  NA 140 140 140 139 139  K 3.6 2.8* 4.2 3.5 3.8  CL 105 106 105 106 107  CO2 22 22 18* 22 22  GLUCOSE 72 83 164* 178* 181*  BUN 7* 7* 14 20 21   CREATININE 0.83 0.71 0.81 0.90 0.87  CALCIUM  9.4 9.2 10.0 9.7 9.2   Liver Function Tests: No results for input(s): AST, ALT, ALKPHOS, BILITOT, PROT, ALBUMIN  in the last 168 hours. CBG: Recent Labs  Lab 03/30/24 0725 03/30/24 1119 03/30/24 1626 03/30/24 2145 03/31/24 0739  GLUCAP 158* 207* 221* 127* 125*    Discharge time spent: greater than 30 minutes.  Signed: Nydia Distance, MD [facility] 03/31/2024

## 2024-03-31 NOTE — Progress Notes (Signed)
 Occupational Therapy Treatment Patient Details Name: Phillip Turner MRN: [id number] DOB: 1961-04-24 Today's Date: 03/31/2024   History of present illness Phillip Turner is a 63 y.o. male who presented to the ED with left foot pain and edema that he states is similar to his prior acute gout episodes for the past 7 days has not responded to his colchicine  use. Admitted to [facility] for w/o Pain swelling on b/l ankles and foot x 2 wks and worsening  Suspected cellulitis of left foot> right  Chronic Gout on chronic colchicine ; ortho c/s reveals follow gout treatment   PMH: alcohol  abuse in remission, anxiety, arthritis, depression, type 2 diabetes, hyperlipidemia, hypertension, hypothyroidism, rheumatic fever as a teenager, sickle cell trait, history of CVA with mild residual right-sided weakness and occasional speech disturbance, history of cocaine  abuse in remission   OT comments  Pt mobilizing mod I with RW to gather items in preparation for discharge today. Educated in multiple uses of 3 in 1 and set up and in sizing the walker for home. Pt verbalizing understanding.       If plan is discharge home, recommend the following:  Assistance with cooking/housework;Help with stairs or ramp for entrance;Assist for transportation   Equipment Recommendations  BSC/3in1    Recommendations for Other Services      Precautions / Restrictions Precautions Precautions: Fall Recall of Precautions/Restrictions: Intact Restrictions Weight Bearing Restrictions Per Provider Order: No       Mobility Bed Mobility Overal bed mobility: Modified Independent                  Transfers Overall transfer level: Modified independent Equipment used: Rolling walker (2 wheels), None                     Balance                                           ADL either performed or assessed with clinical judgement   ADL Overall ADL's : Modified independent                                      Functional mobility during ADLs: Modified independent;Rolling walker (2 wheels) General ADL Comments: educated in set up and multiple uses of 3 in 1 and sizing RW appropriately for his height    Extremity/Trunk Assessment              Vision       Perception     Praxis     Communication Communication Communication: No apparent difficulties   Cognition Arousal: Alert Behavior During Therapy: WFL for tasks assessed/performed Cognition: No apparent impairments                               Following commands: Intact        Cueing   Cueing Techniques: Verbal cues  Exercises      Shoulder Instructions       General Comments      Pertinent Vitals/ Pain       Pain Assessment Pain Assessment: Faces Faces Pain Scale: Hurts little more Pain Location: BLE Pain Descriptors / Indicators: Discomfort Pain Intervention(s): Monitored during session, Repositioned, Patient requesting pain meds-RN notified  Home Living                                          Prior Functioning/Environment              Frequency  Min 2X/week        Progress Toward Goals  OT Goals(current goals can now be found in the care plan section)  Progress towards OT goals: Goals met/education completed, patient discharged from OT     Plan      Co-evaluation                 AM-PAC OT 6 Clicks Daily Activity     Outcome Measure   Help from another person eating meals?: None Help from another person taking care of personal grooming?: None Help from another person toileting, which includes using toliet, bedpan, or urinal?: None Help from another person bathing (including washing, rinsing, drying)?: None Help from another person to put on and taking off regular upper body clothing?: None Help from another person to put on and taking off regular lower body clothing?: None 6 Click Score: 24    End of Session Equipment  Utilized During Treatment: Rolling walker (2 wheels);Gait belt  OT Visit Diagnosis: Unsteadiness on feet (R26.81);Other abnormalities of gait and mobility (R26.89);Pain   Activity Tolerance Patient tolerated treatment well   Patient Left in bed;with call bell/phone within reach;with family/visitor present   Nurse Communication Other (comment) (wants medication and to d/c)        Time: 8844-8787 OT Time Calculation (min): 17 min  Charges: OT General Charges $OT Visit: 1 Visit OT Treatments $Self Care/Home Management : 8-22 mins  Mliss HERO, OTR/L Acute Rehabilitation Services Office: [phone]   Kennth Mliss Helling 03/31/2024, 12:17 PM

## 2024-04-02 ENCOUNTER — Other Ambulatory Visit (HOSPITAL_COMMUNITY): Payer: Self-pay

## 2024-04-06 ENCOUNTER — Other Ambulatory Visit (HOSPITAL_COMMUNITY): Payer: Self-pay

## 2024-04-06 MED ORDER — OXYCODONE-ACETAMINOPHEN 10-325 MG PO TABS
1.0000 | ORAL_TABLET | Freq: Four times a day (QID) | ORAL | 0 refills | Status: DC | PRN
  Filled 2024-04-06 – 2024-04-07 (×2): qty 120, 30d supply, fill #0

## 2024-04-07 ENCOUNTER — Other Ambulatory Visit (HOSPITAL_COMMUNITY): Payer: Self-pay

## 2024-04-07 ENCOUNTER — Other Ambulatory Visit: Payer: Self-pay

## 2024-04-14 ENCOUNTER — Other Ambulatory Visit (HOSPITAL_COMMUNITY): Payer: Self-pay

## 2024-04-27 ENCOUNTER — Other Ambulatory Visit (HOSPITAL_COMMUNITY): Payer: Self-pay

## 2024-04-27 MED ORDER — OXYCODONE-ACETAMINOPHEN 10-325 MG PO TABS
1.0000 | ORAL_TABLET | Freq: Four times a day (QID) | ORAL | 0 refills | Status: AC | PRN
  Filled 2024-05-07: qty 120, 30d supply, fill #0

## 2024-04-27 MED ORDER — OXYCODONE-ACETAMINOPHEN 10-325 MG PO TABS
1.0000 | ORAL_TABLET | Freq: Four times a day (QID) | ORAL | 0 refills | Status: AC | PRN
  Filled 2024-06-06: qty 120, 30d supply, fill #0

## 2024-04-27 MED ORDER — OXYCODONE-ACETAMINOPHEN 10-325 MG PO TABS: 1.0000 | ORAL_TABLET | Freq: Four times a day (QID) | ORAL | 0 refills | Status: AC | PRN

## 2024-05-03 ENCOUNTER — Other Ambulatory Visit: Payer: Self-pay

## 2024-05-03 ENCOUNTER — Other Ambulatory Visit (HOSPITAL_COMMUNITY): Payer: Self-pay

## 2024-05-04 ENCOUNTER — Other Ambulatory Visit (HOSPITAL_COMMUNITY): Payer: Self-pay

## 2024-05-07 ENCOUNTER — Other Ambulatory Visit (HOSPITAL_COMMUNITY): Payer: Self-pay

## 2024-05-24 ENCOUNTER — Other Ambulatory Visit: Payer: Self-pay

## 2024-05-31 ENCOUNTER — Ambulatory Visit: Admitting: Podiatry

## 2024-06-01 ENCOUNTER — Other Ambulatory Visit (HOSPITAL_COMMUNITY): Payer: Self-pay

## 2024-06-01 ENCOUNTER — Other Ambulatory Visit: Payer: Self-pay

## 2024-06-06 ENCOUNTER — Other Ambulatory Visit: Payer: Self-pay

## 2024-06-06 ENCOUNTER — Other Ambulatory Visit (HOSPITAL_COMMUNITY): Payer: Self-pay

## 2024-06-07 ENCOUNTER — Other Ambulatory Visit (HOSPITAL_COMMUNITY): Payer: Self-pay

## 2024-06-09 ENCOUNTER — Ambulatory Visit: Admitting: Podiatry

## 2024-06-17 ENCOUNTER — Ambulatory Visit: Admitting: Podiatry
